# Patient Record
Sex: Female | Born: 1945 | Race: White | Hispanic: No | Marital: Married | Smoking: Former smoker
Health system: Southern US, Community
[De-identification: ages and names within clinical notes are randomized; demographics above are authoritative.]

## PROBLEM LIST (undated history)

## (undated) DIAGNOSIS — Z8719 Personal history of other diseases of the digestive system: Secondary | ICD-10-CM

## (undated) DIAGNOSIS — K219 Gastro-esophageal reflux disease without esophagitis: Secondary | ICD-10-CM

## (undated) DIAGNOSIS — M1990 Unspecified osteoarthritis, unspecified site: Secondary | ICD-10-CM

## (undated) DIAGNOSIS — I10 Essential (primary) hypertension: Secondary | ICD-10-CM

## (undated) DIAGNOSIS — R9431 Abnormal electrocardiogram [ECG] [EKG]: Secondary | ICD-10-CM

## (undated) DIAGNOSIS — F329 Major depressive disorder, single episode, unspecified: Secondary | ICD-10-CM

## (undated) DIAGNOSIS — F32A Depression, unspecified: Secondary | ICD-10-CM

## (undated) DIAGNOSIS — R112 Nausea with vomiting, unspecified: Secondary | ICD-10-CM

## (undated) DIAGNOSIS — Z9889 Other specified postprocedural states: Secondary | ICD-10-CM

## (undated) DIAGNOSIS — F411 Generalized anxiety disorder: Secondary | ICD-10-CM

## (undated) DIAGNOSIS — F3289 Other specified depressive episodes: Secondary | ICD-10-CM

## (undated) DIAGNOSIS — E039 Hypothyroidism, unspecified: Secondary | ICD-10-CM

## (undated) DIAGNOSIS — K8020 Calculus of gallbladder without cholecystitis without obstruction: Secondary | ICD-10-CM

## (undated) DIAGNOSIS — K5790 Diverticulosis of intestine, part unspecified, without perforation or abscess without bleeding: Secondary | ICD-10-CM

## (undated) DIAGNOSIS — G4733 Obstructive sleep apnea (adult) (pediatric): Principal | ICD-10-CM

## (undated) DIAGNOSIS — K222 Esophageal obstruction: Secondary | ICD-10-CM

## (undated) DIAGNOSIS — Z87440 Personal history of urinary (tract) infections: Secondary | ICD-10-CM

## (undated) DIAGNOSIS — H8110 Benign paroxysmal vertigo, unspecified ear: Secondary | ICD-10-CM

## (undated) DIAGNOSIS — K449 Diaphragmatic hernia without obstruction or gangrene: Secondary | ICD-10-CM

## (undated) DIAGNOSIS — R7309 Other abnormal glucose: Secondary | ICD-10-CM

## (undated) DIAGNOSIS — D649 Anemia, unspecified: Secondary | ICD-10-CM

## (undated) DIAGNOSIS — T7840XA Allergy, unspecified, initial encounter: Secondary | ICD-10-CM

## (undated) DIAGNOSIS — R011 Cardiac murmur, unspecified: Secondary | ICD-10-CM

## (undated) DIAGNOSIS — E785 Hyperlipidemia, unspecified: Secondary | ICD-10-CM

## (undated) DIAGNOSIS — Z8639 Personal history of other endocrine, nutritional and metabolic disease: Secondary | ICD-10-CM

## (undated) HISTORY — DX: Depression, unspecified: F32.A

## (undated) HISTORY — DX: Cardiac murmur, unspecified: R01.1

## (undated) HISTORY — DX: Essential (primary) hypertension: I10

## (undated) HISTORY — DX: Benign paroxysmal vertigo, unspecified ear: H81.10

## (undated) HISTORY — DX: Generalized anxiety disorder: F41.1

## (undated) HISTORY — DX: Diaphragmatic hernia without obstruction or gangrene: K44.9

## (undated) HISTORY — DX: Other abnormal glucose: R73.09

## (undated) HISTORY — DX: Personal history of other endocrine, nutritional and metabolic disease: Z86.39

## (undated) HISTORY — DX: Personal history of other diseases of the digestive system: Z87.19

## (undated) HISTORY — DX: Major depressive disorder, single episode, unspecified: F32.9

## (undated) HISTORY — DX: Anemia, unspecified: D64.9

## (undated) HISTORY — DX: Other specified depressive episodes: F32.89

## (undated) HISTORY — DX: Esophageal obstruction: K22.2

## (undated) HISTORY — DX: Obstructive sleep apnea (adult) (pediatric): G47.33

## (undated) HISTORY — DX: Gastro-esophageal reflux disease without esophagitis: K21.9

## (undated) HISTORY — DX: Personal history of urinary (tract) infections: Z87.440

## (undated) HISTORY — DX: Calculus of gallbladder without cholecystitis without obstruction: K80.20

## (undated) HISTORY — DX: Hypothyroidism, unspecified: E03.9

## (undated) HISTORY — DX: Diverticulosis of intestine, part unspecified, without perforation or abscess without bleeding: K57.90

## (undated) HISTORY — DX: Hyperlipidemia, unspecified: E78.5

## (undated) HISTORY — PX: DENTAL SURGERY: SHX609

## (undated) HISTORY — DX: Allergy, unspecified, initial encounter: T78.40XA

---

## 1999-11-28 ENCOUNTER — Other Ambulatory Visit: Admission: RE | Admit: 1999-11-28 | Discharge: 1999-11-28 | Payer: Self-pay | Admitting: Obstetrics & Gynecology

## 2002-03-17 ENCOUNTER — Other Ambulatory Visit: Admission: RE | Admit: 2002-03-17 | Discharge: 2002-03-17 | Payer: Self-pay | Admitting: Obstetrics & Gynecology

## 2002-05-07 HISTORY — PX: CHOLECYSTECTOMY: SHX55

## 2002-09-09 ENCOUNTER — Encounter: Payer: Self-pay | Admitting: Gastroenterology

## 2002-09-09 ENCOUNTER — Ambulatory Visit (HOSPITAL_COMMUNITY): Admission: RE | Admit: 2002-09-09 | Discharge: 2002-09-09 | Payer: Self-pay | Admitting: Gastroenterology

## 2002-09-15 ENCOUNTER — Inpatient Hospital Stay (HOSPITAL_COMMUNITY): Admission: RE | Admit: 2002-09-15 | Discharge: 2002-09-19 | Payer: Self-pay | Admitting: *Deleted

## 2002-09-15 ENCOUNTER — Encounter (INDEPENDENT_AMBULATORY_CARE_PROVIDER_SITE_OTHER): Payer: Self-pay | Admitting: *Deleted

## 2002-10-15 ENCOUNTER — Ambulatory Visit (HOSPITAL_COMMUNITY): Admission: RE | Admit: 2002-10-15 | Discharge: 2002-10-15 | Payer: Self-pay | Admitting: *Deleted

## 2003-04-19 ENCOUNTER — Other Ambulatory Visit: Admission: RE | Admit: 2003-04-19 | Discharge: 2003-04-19 | Payer: Self-pay | Admitting: Obstetrics & Gynecology

## 2003-05-08 HISTORY — PX: TOTAL ABDOMINAL HYSTERECTOMY W/ BILATERAL SALPINGOOPHORECTOMY: SHX83

## 2003-12-06 HISTORY — PX: COLONOSCOPY: SHX174

## 2003-12-06 LAB — HM COLONOSCOPY

## 2004-01-13 ENCOUNTER — Encounter (INDEPENDENT_AMBULATORY_CARE_PROVIDER_SITE_OTHER): Payer: Self-pay | Admitting: *Deleted

## 2004-01-13 ENCOUNTER — Observation Stay (HOSPITAL_COMMUNITY): Admission: RE | Admit: 2004-01-13 | Discharge: 2004-01-14 | Payer: Self-pay | Admitting: Obstetrics & Gynecology

## 2004-02-07 ENCOUNTER — Encounter: Payer: Self-pay | Admitting: Internal Medicine

## 2004-04-18 ENCOUNTER — Ambulatory Visit: Payer: Self-pay | Admitting: Gastroenterology

## 2004-10-16 ENCOUNTER — Ambulatory Visit: Payer: Self-pay | Admitting: Internal Medicine

## 2004-10-30 ENCOUNTER — Ambulatory Visit: Payer: Self-pay | Admitting: *Deleted

## 2004-11-13 ENCOUNTER — Ambulatory Visit: Payer: Self-pay | Admitting: *Deleted

## 2004-11-20 ENCOUNTER — Ambulatory Visit: Payer: Self-pay | Admitting: *Deleted

## 2004-11-28 ENCOUNTER — Ambulatory Visit: Payer: Self-pay | Admitting: *Deleted

## 2004-12-04 ENCOUNTER — Ambulatory Visit: Payer: Self-pay | Admitting: *Deleted

## 2004-12-11 ENCOUNTER — Ambulatory Visit: Payer: Self-pay | Admitting: *Deleted

## 2004-12-25 ENCOUNTER — Ambulatory Visit: Payer: Self-pay | Admitting: *Deleted

## 2005-01-01 ENCOUNTER — Ambulatory Visit: Payer: Self-pay | Admitting: *Deleted

## 2005-01-15 ENCOUNTER — Ambulatory Visit: Payer: Self-pay | Admitting: *Deleted

## 2005-01-22 ENCOUNTER — Ambulatory Visit: Payer: Self-pay | Admitting: *Deleted

## 2005-01-29 ENCOUNTER — Ambulatory Visit: Payer: Self-pay | Admitting: *Deleted

## 2005-02-05 ENCOUNTER — Ambulatory Visit: Payer: Self-pay | Admitting: *Deleted

## 2005-02-19 ENCOUNTER — Ambulatory Visit: Payer: Self-pay | Admitting: *Deleted

## 2005-02-20 ENCOUNTER — Ambulatory Visit: Payer: Self-pay | Admitting: Internal Medicine

## 2005-03-02 ENCOUNTER — Ambulatory Visit: Payer: Self-pay | Admitting: *Deleted

## 2005-03-16 ENCOUNTER — Ambulatory Visit: Payer: Self-pay | Admitting: *Deleted

## 2005-06-04 ENCOUNTER — Ambulatory Visit: Payer: Self-pay | Admitting: *Deleted

## 2005-06-25 ENCOUNTER — Ambulatory Visit: Payer: Self-pay | Admitting: *Deleted

## 2005-07-02 ENCOUNTER — Other Ambulatory Visit: Admission: RE | Admit: 2005-07-02 | Discharge: 2005-07-02 | Payer: Self-pay | Admitting: Obstetrics & Gynecology

## 2005-07-02 ENCOUNTER — Ambulatory Visit: Payer: Self-pay | Admitting: *Deleted

## 2005-07-09 ENCOUNTER — Ambulatory Visit: Payer: Self-pay | Admitting: *Deleted

## 2006-05-13 ENCOUNTER — Ambulatory Visit: Payer: Self-pay | Admitting: Internal Medicine

## 2006-05-13 LAB — CONVERTED CEMR LAB
ALT: 16 units/L (ref 0–40)
Alkaline Phosphatase: 75 units/L (ref 39–117)
BUN: 5 mg/dL — ABNORMAL LOW (ref 6–23)
Basophils Relative: 0.4 % (ref 0.0–1.0)
Calcium: 10.2 mg/dL (ref 8.4–10.5)
Chloride: 104 meq/L (ref 96–112)
Cholesterol: 217 mg/dL (ref 0–200)
Eosinophil percent: 0.9 % (ref 0.0–5.0)
Glomerular Filtration Rate, Af Am: 73 mL/min/{1.73_m2}
HCT: 37.8 % (ref 36.0–46.0)
Hemoglobin: 12.8 g/dL (ref 12.0–15.0)
LDL DIRECT: 121.5 mg/dL
Lymphocytes Relative: 21.4 % (ref 12.0–46.0)
MCV: 89.9 fL (ref 78.0–100.0)
Neutrophils Relative %: 70.4 % (ref 43.0–77.0)
Potassium: 4.7 meq/L (ref 3.5–5.1)
TSH: 0.18 microintl units/mL — ABNORMAL LOW (ref 0.35–5.50)
Total Protein: 6.9 g/dL (ref 6.0–8.3)
WBC: 6.4 10*3/uL (ref 4.5–10.5)

## 2006-06-26 ENCOUNTER — Ambulatory Visit: Payer: Self-pay | Admitting: Internal Medicine

## 2006-07-06 ENCOUNTER — Encounter: Payer: Self-pay | Admitting: Internal Medicine

## 2006-08-08 ENCOUNTER — Ambulatory Visit: Payer: Self-pay | Admitting: Internal Medicine

## 2006-08-08 LAB — CONVERTED CEMR LAB
Nitrite: NEGATIVE
Specific Gravity, Urine: 1.01 (ref 1.005–1.03)
pH: 7 (ref 5.0–8.0)

## 2006-10-08 ENCOUNTER — Encounter: Payer: Self-pay | Admitting: Family Medicine

## 2007-03-11 ENCOUNTER — Encounter (INDEPENDENT_AMBULATORY_CARE_PROVIDER_SITE_OTHER): Payer: Self-pay | Admitting: *Deleted

## 2007-04-21 ENCOUNTER — Telehealth (INDEPENDENT_AMBULATORY_CARE_PROVIDER_SITE_OTHER): Payer: Self-pay | Admitting: *Deleted

## 2007-05-30 ENCOUNTER — Telehealth (INDEPENDENT_AMBULATORY_CARE_PROVIDER_SITE_OTHER): Payer: Self-pay | Admitting: *Deleted

## 2007-06-02 ENCOUNTER — Telehealth (INDEPENDENT_AMBULATORY_CARE_PROVIDER_SITE_OTHER): Payer: Self-pay | Admitting: *Deleted

## 2007-07-22 ENCOUNTER — Encounter: Payer: Self-pay | Admitting: Internal Medicine

## 2007-07-22 DIAGNOSIS — E039 Hypothyroidism, unspecified: Secondary | ICD-10-CM

## 2007-07-22 DIAGNOSIS — Z8719 Personal history of other diseases of the digestive system: Secondary | ICD-10-CM | POA: Insufficient documentation

## 2007-07-22 DIAGNOSIS — K219 Gastro-esophageal reflux disease without esophagitis: Secondary | ICD-10-CM

## 2007-07-23 ENCOUNTER — Ambulatory Visit: Payer: Self-pay | Admitting: Internal Medicine

## 2007-07-23 DIAGNOSIS — E785 Hyperlipidemia, unspecified: Secondary | ICD-10-CM | POA: Insufficient documentation

## 2007-07-27 LAB — CONVERTED CEMR LAB
ALT: 16 units/L (ref 0–35)
AST: 22 units/L (ref 0–37)
Basophils Relative: 0.6 % (ref 0.0–1.0)
Bilirubin, Direct: 0.1 mg/dL (ref 0.0–0.3)
CO2: 27 meq/L (ref 19–32)
Calcium: 10.4 mg/dL (ref 8.4–10.5)
Chloride: 106 meq/L (ref 96–112)
Eosinophils Relative: 0.9 % (ref 0.0–5.0)
Glucose, Bld: 96 mg/dL (ref 70–99)
HDL: 81.2 mg/dL (ref >39.0)
Lymphocytes Relative: 21.8 % (ref 12.0–46.0)
Platelets: 300 10*3/uL (ref 150–400)
RBC: 4.24 M/uL (ref 3.87–5.11)
Total Bilirubin: 0.5 mg/dL (ref 0.3–1.2)
Total Protein: 6.6 g/dL (ref 6.0–8.3)
Triglycerides: 110 mg/dL (ref 0–149)
VLDL: 22 mg/dL (ref 0–40)
WBC: 6.5 10*3/uL (ref 4.5–10.5)

## 2007-07-28 ENCOUNTER — Encounter (INDEPENDENT_AMBULATORY_CARE_PROVIDER_SITE_OTHER): Payer: Self-pay | Admitting: *Deleted

## 2007-08-18 ENCOUNTER — Encounter: Payer: Self-pay | Admitting: Internal Medicine

## 2007-08-18 ENCOUNTER — Ambulatory Visit: Payer: Self-pay | Admitting: Family Medicine

## 2007-09-05 ENCOUNTER — Ambulatory Visit: Payer: Self-pay | Admitting: *Deleted

## 2007-09-09 ENCOUNTER — Encounter (INDEPENDENT_AMBULATORY_CARE_PROVIDER_SITE_OTHER): Payer: Self-pay | Admitting: *Deleted

## 2007-09-12 ENCOUNTER — Ambulatory Visit: Payer: Self-pay | Admitting: Internal Medicine

## 2007-09-18 ENCOUNTER — Encounter (INDEPENDENT_AMBULATORY_CARE_PROVIDER_SITE_OTHER): Payer: Self-pay | Admitting: *Deleted

## 2007-09-18 ENCOUNTER — Telehealth (INDEPENDENT_AMBULATORY_CARE_PROVIDER_SITE_OTHER): Payer: Self-pay | Admitting: *Deleted

## 2007-09-19 ENCOUNTER — Ambulatory Visit: Payer: Self-pay | Admitting: *Deleted

## 2007-09-24 ENCOUNTER — Ambulatory Visit: Payer: Self-pay | Admitting: Internal Medicine

## 2007-09-26 ENCOUNTER — Ambulatory Visit: Payer: Self-pay | Admitting: *Deleted

## 2007-10-03 ENCOUNTER — Ambulatory Visit: Payer: Self-pay | Admitting: *Deleted

## 2007-10-15 ENCOUNTER — Encounter: Payer: Self-pay | Admitting: Internal Medicine

## 2007-11-12 ENCOUNTER — Ambulatory Visit: Payer: Self-pay | Admitting: Cardiology

## 2007-12-02 ENCOUNTER — Telehealth (INDEPENDENT_AMBULATORY_CARE_PROVIDER_SITE_OTHER): Payer: Self-pay | Admitting: *Deleted

## 2007-12-15 ENCOUNTER — Ambulatory Visit: Payer: Self-pay | Admitting: Cardiology

## 2007-12-31 ENCOUNTER — Telehealth (INDEPENDENT_AMBULATORY_CARE_PROVIDER_SITE_OTHER): Payer: Self-pay | Admitting: *Deleted

## 2008-01-02 ENCOUNTER — Ambulatory Visit: Payer: Self-pay | Admitting: Internal Medicine

## 2008-01-02 ENCOUNTER — Telehealth (INDEPENDENT_AMBULATORY_CARE_PROVIDER_SITE_OTHER): Payer: Self-pay | Admitting: *Deleted

## 2008-01-05 ENCOUNTER — Telehealth (INDEPENDENT_AMBULATORY_CARE_PROVIDER_SITE_OTHER): Payer: Self-pay | Admitting: *Deleted

## 2008-01-06 ENCOUNTER — Encounter (INDEPENDENT_AMBULATORY_CARE_PROVIDER_SITE_OTHER): Payer: Self-pay | Admitting: *Deleted

## 2008-01-08 ENCOUNTER — Telehealth (INDEPENDENT_AMBULATORY_CARE_PROVIDER_SITE_OTHER): Payer: Self-pay | Admitting: *Deleted

## 2008-01-21 ENCOUNTER — Ambulatory Visit: Payer: Self-pay | Admitting: *Deleted

## 2008-01-30 ENCOUNTER — Ambulatory Visit: Payer: Self-pay | Admitting: *Deleted

## 2008-02-20 ENCOUNTER — Ambulatory Visit: Payer: Self-pay | Admitting: Internal Medicine

## 2008-02-20 ENCOUNTER — Ambulatory Visit: Payer: Self-pay | Admitting: *Deleted

## 2008-05-03 ENCOUNTER — Ambulatory Visit: Payer: Self-pay | Admitting: Internal Medicine

## 2008-06-02 ENCOUNTER — Ambulatory Visit: Payer: Self-pay | Admitting: Internal Medicine

## 2008-06-02 LAB — CONVERTED CEMR LAB
Bilirubin Urine: NEGATIVE
Glucose, Urine, Semiquant: NEGATIVE
Ketones, urine, test strip: NEGATIVE
Protein, U semiquant: NEGATIVE
Urobilinogen, UA: 0.2
pH: 6

## 2008-06-03 ENCOUNTER — Ambulatory Visit: Payer: Self-pay | Admitting: Internal Medicine

## 2008-06-04 ENCOUNTER — Encounter: Payer: Self-pay | Admitting: Internal Medicine

## 2008-06-04 LAB — CONVERTED CEMR LAB
Basophils Relative: 0.4 % (ref 0.0–3.0)
Eosinophils Relative: 2.1 % (ref 0.0–5.0)
Monocytes Relative: 9.3 % (ref 3.0–12.0)
Neutrophils Relative %: 65.8 % (ref 43.0–77.0)
Platelets: 322 10*3/uL (ref 150–400)
RBC: 4.3 M/uL (ref 3.87–5.11)
WBC: 7.5 10*3/uL (ref 4.5–10.5)

## 2008-06-07 ENCOUNTER — Telehealth (INDEPENDENT_AMBULATORY_CARE_PROVIDER_SITE_OTHER): Payer: Self-pay | Admitting: *Deleted

## 2008-06-07 ENCOUNTER — Encounter: Payer: Self-pay | Admitting: Internal Medicine

## 2008-08-10 ENCOUNTER — Telehealth (INDEPENDENT_AMBULATORY_CARE_PROVIDER_SITE_OTHER): Payer: Self-pay | Admitting: *Deleted

## 2008-12-02 ENCOUNTER — Encounter: Payer: Self-pay | Admitting: Internal Medicine

## 2008-12-24 ENCOUNTER — Ambulatory Visit: Payer: Self-pay | Admitting: Internal Medicine

## 2008-12-24 DIAGNOSIS — E559 Vitamin D deficiency, unspecified: Secondary | ICD-10-CM

## 2008-12-24 DIAGNOSIS — F3289 Other specified depressive episodes: Secondary | ICD-10-CM | POA: Insufficient documentation

## 2008-12-24 DIAGNOSIS — F329 Major depressive disorder, single episode, unspecified: Secondary | ICD-10-CM | POA: Insufficient documentation

## 2008-12-24 DIAGNOSIS — H8110 Benign paroxysmal vertigo, unspecified ear: Secondary | ICD-10-CM

## 2008-12-24 DIAGNOSIS — R9431 Abnormal electrocardiogram [ECG] [EKG]: Secondary | ICD-10-CM

## 2008-12-24 DIAGNOSIS — R7309 Other abnormal glucose: Secondary | ICD-10-CM

## 2008-12-24 DIAGNOSIS — F411 Generalized anxiety disorder: Secondary | ICD-10-CM | POA: Insufficient documentation

## 2008-12-27 ENCOUNTER — Ambulatory Visit: Payer: Self-pay | Admitting: Internal Medicine

## 2008-12-31 LAB — CONVERTED CEMR LAB
AST: 25 units/L (ref 0–37)
Alkaline Phosphatase: 70 units/L (ref 39–117)
Basophils Relative: 0.5 % (ref 0.0–3.0)
Bilirubin, Direct: 0 mg/dL (ref 0.0–0.3)
Calcium: 10.2 mg/dL (ref 8.4–10.5)
Cholesterol: 232 mg/dL — ABNORMAL HIGH (ref 0–200)
Creatinine, Ser: 0.7 mg/dL (ref 0.4–1.2)
Direct LDL: 142.7 mg/dL
Eosinophils Absolute: 0.1 10*3/uL (ref 0.0–0.7)
Eosinophils Relative: 1.1 % (ref 0.0–5.0)
GFR calc non Af Amer: 90.15 mL/min (ref >60)
HDL: 74.8 mg/dL (ref >39.00)
Hemoglobin: 13.1 g/dL (ref 12.0–15.0)
Lymphocytes Relative: 22.9 % (ref 12.0–46.0)
MCHC: 35.2 g/dL (ref 30.0–36.0)
Monocytes Relative: 7.2 % (ref 3.0–12.0)
Neutrophils Relative %: 68.3 % (ref 43.0–77.0)
RBC: 4.05 M/uL (ref 3.87–5.11)
Sodium: 140 meq/L (ref 135–145)
Total CHOL/HDL Ratio: 3
Total Protein: 6.9 g/dL (ref 6.0–8.3)
Triglycerides: 105 mg/dL (ref 0.0–149.0)
VLDL: 21 mg/dL (ref 0.0–40.0)
WBC: 7 10*3/uL (ref 4.5–10.5)

## 2009-01-03 ENCOUNTER — Encounter (INDEPENDENT_AMBULATORY_CARE_PROVIDER_SITE_OTHER): Payer: Self-pay | Admitting: *Deleted

## 2009-01-12 ENCOUNTER — Ambulatory Visit: Payer: Self-pay | Admitting: Cardiology

## 2009-05-16 ENCOUNTER — Ambulatory Visit: Payer: Self-pay | Admitting: Family

## 2009-06-08 ENCOUNTER — Ambulatory Visit: Payer: Self-pay | Admitting: *Deleted

## 2009-06-17 ENCOUNTER — Ambulatory Visit: Payer: Self-pay | Admitting: *Deleted

## 2009-06-29 ENCOUNTER — Ambulatory Visit: Payer: Self-pay | Admitting: *Deleted

## 2009-07-04 ENCOUNTER — Ambulatory Visit: Payer: Self-pay | Admitting: Internal Medicine

## 2009-07-04 DIAGNOSIS — R109 Unspecified abdominal pain: Secondary | ICD-10-CM | POA: Insufficient documentation

## 2009-07-04 LAB — CONVERTED CEMR LAB
Bilirubin Urine: NEGATIVE
Ketones, urine, test strip: NEGATIVE
Urobilinogen, UA: 0.2
Vit D, 25-Hydroxy: 37 ng/mL (ref 30–89)
pH: 6

## 2009-07-08 LAB — CONVERTED CEMR LAB
BUN: 5 mg/dL — ABNORMAL LOW (ref 6–23)
Basophils Relative: 5.3 % — ABNORMAL HIGH (ref 0.0–3.0)
Hemoglobin: 12.2 g/dL (ref 12.0–15.0)
Lymphocytes Relative: 47.1 % — ABNORMAL HIGH (ref 12.0–46.0)
Monocytes Relative: 28 % — ABNORMAL HIGH (ref 3.0–12.0)
Neutro Abs: 0.7 10*3/uL — ABNORMAL LOW (ref 1.4–7.7)
RBC: 3.89 M/uL (ref 3.87–5.11)

## 2009-07-13 ENCOUNTER — Ambulatory Visit: Payer: Self-pay | Admitting: *Deleted

## 2009-07-13 ENCOUNTER — Telehealth (INDEPENDENT_AMBULATORY_CARE_PROVIDER_SITE_OTHER): Payer: Self-pay | Admitting: *Deleted

## 2009-09-08 ENCOUNTER — Ambulatory Visit: Payer: Self-pay | Admitting: Internal Medicine

## 2009-09-08 DIAGNOSIS — J069 Acute upper respiratory infection, unspecified: Secondary | ICD-10-CM | POA: Insufficient documentation

## 2009-09-29 ENCOUNTER — Telehealth (INDEPENDENT_AMBULATORY_CARE_PROVIDER_SITE_OTHER): Payer: Self-pay | Admitting: *Deleted

## 2009-11-01 ENCOUNTER — Telehealth (INDEPENDENT_AMBULATORY_CARE_PROVIDER_SITE_OTHER): Payer: Self-pay | Admitting: *Deleted

## 2009-11-11 ENCOUNTER — Ambulatory Visit: Payer: Self-pay | Admitting: *Deleted

## 2009-11-18 ENCOUNTER — Ambulatory Visit: Payer: Self-pay | Admitting: *Deleted

## 2009-11-25 ENCOUNTER — Ambulatory Visit: Payer: Self-pay | Admitting: *Deleted

## 2009-12-02 ENCOUNTER — Ambulatory Visit: Payer: Self-pay | Admitting: *Deleted

## 2009-12-16 ENCOUNTER — Ambulatory Visit: Payer: Self-pay | Admitting: *Deleted

## 2009-12-28 ENCOUNTER — Ambulatory Visit: Payer: Self-pay | Admitting: *Deleted

## 2010-01-10 ENCOUNTER — Encounter: Payer: Self-pay | Admitting: Internal Medicine

## 2010-01-13 ENCOUNTER — Ambulatory Visit: Payer: Self-pay | Admitting: Cardiology

## 2010-01-23 ENCOUNTER — Ambulatory Visit: Payer: Self-pay | Admitting: Internal Medicine

## 2010-01-23 ENCOUNTER — Encounter: Payer: Self-pay | Admitting: Internal Medicine

## 2010-01-30 ENCOUNTER — Encounter: Payer: Self-pay | Admitting: Cardiology

## 2010-01-30 ENCOUNTER — Ambulatory Visit (HOSPITAL_COMMUNITY): Admission: RE | Admit: 2010-01-30 | Discharge: 2010-01-30 | Payer: Self-pay | Admitting: Cardiology

## 2010-01-30 ENCOUNTER — Ambulatory Visit: Payer: Self-pay

## 2010-01-30 ENCOUNTER — Ambulatory Visit: Payer: Self-pay | Admitting: Internal Medicine

## 2010-03-06 ENCOUNTER — Ambulatory Visit: Payer: Self-pay | Admitting: Internal Medicine

## 2010-03-13 ENCOUNTER — Ambulatory Visit: Payer: Self-pay | Admitting: Internal Medicine

## 2010-03-13 LAB — CONVERTED CEMR LAB
TSH: 0.61 microintl units/mL (ref 0.35–5.50)
Vit D, 25-Hydroxy: 50 ng/mL (ref 30–89)

## 2010-03-21 ENCOUNTER — Telehealth (INDEPENDENT_AMBULATORY_CARE_PROVIDER_SITE_OTHER): Payer: Self-pay | Admitting: *Deleted

## 2010-04-17 ENCOUNTER — Telehealth: Payer: Self-pay | Admitting: Internal Medicine

## 2010-04-19 ENCOUNTER — Ambulatory Visit: Payer: Self-pay | Admitting: Internal Medicine

## 2010-04-19 DIAGNOSIS — R439 Unspecified disturbances of smell and taste: Secondary | ICD-10-CM

## 2010-04-19 DIAGNOSIS — J309 Allergic rhinitis, unspecified: Secondary | ICD-10-CM | POA: Insufficient documentation

## 2010-06-08 NOTE — Assessment & Plan Note (Signed)
Summary: bitter taste, burning on tongue//fd   Vital Signs:  Patient profile:   65 year old female Weight:      215.6 pounds BMI:     36.01 Temp:     97.4 degrees F oral Pulse rate:   72 / minute Resp:     15 per minute BP sitting:   124 / 80  (left arm) Cuff size:   large  Vitals Entered By: Shonna Chock CMA (April 19, 2010 3:25 PM) CC: Bitter taste in mouth x 2 weeks , URI symptoms, Heartburn   Primary Care Provider:  Marga Melnick MD  CC:  Bitter taste in mouth x 2 weeks , URI symptoms, and Heartburn.  History of Present Illness:   She has had some bitter taste for several weeks w/o specific trigger such as infection,food or meds..  The patient denies nasal congestion, purulent nasal discharge, sore throat, and productive cough.    The patient reports acid reflux and epigastric & LUQ  pain last week, but denies sour taste in mouth and trouble swallowing. Her R tonsil collects debris intermittently. She denies halitosis. She has chronic PNDrainage. The patient reports  vomiting12/04/2010.  The patient denies the following alarm features: dysphagia.    Current Medications (verified): 1)  Synthroid 125 Mcg Tabs (Levothyroxine Sodium) .[name].. 1 By Mouth Once Daily Except 1/2 On Weds  Brand Name Necessary 2)  Prilosec 20 Mg  Cpdr (Omeprazole) .[name].. 1 By Mouth Once Daily 3)  Alprazolam 0.25 Mg  Tabs (Alprazolam) .... Once Daily As Needed 4)  Prozac 20 Mg  Caps (Fluoxetine Hcl) .... Take One Tablet Daily 5)  Premarin 0.45 Mg  Tabs (Estrogens Conjugated) .... Take One Tablet Daily 6)  Vitamin D (Ergocalciferol) 50000 Unit Caps (Ergocalciferol) .[name].. 1 Tab Weekly 7)  B Complex Vitamins  Caps (B Complex Vitamins) .[name].. 1 By Mouth Once Daily 8)  Vitamin D 2000 Unit Tabs (Cholecalciferol) .[name].. 1 By Mouth Once Daily 9)  Multivitamins  Tabs (Multiple Vitamin) .[name].. 1 By Mouth Once Daily 10)  Alendronate Sodium 70 Mg Tabs (Alendronate Sodium) .[name].. 1 Pill Every Week With Water 11)  Calcium 600  Mg Tabs (Calcium) .[name].. 1 By Mouth Once Daily  Allergies (verified): No Known Drug Allergies  Review of Systems ENT:  Denies decreased hearing, ear discharge, and ringing in ears; Perfumes cause nausea. Allergy:  Denies itching eyes and sneezing.  Physical Exam  General:  well-nourished; alert,appropriate and cooperative throughout examination Eyes:  No corneal or conjunctival inflammation noted. EOMI. Perrla. Ears:  External ear exam shows no significant lesions or deformities.  Otoscopic examination reveals clear canals, tympanic membranes are intact bilaterally without bulging, retraction, inflammation or discharge. Hearing is grossly normal bilaterally. Nose:  External nasal examination shows no deformity or inflammation. Nasal mucosa are pink and moist without lesions or exudates. Mouth:  Oral mucosa and oropharynx without lesions or exudates.  Upper & lower partials Cervical Nodes:  No lymphadenopathy noted Axillary Nodes:  No palpable lymphadenopathy   Impression & Recommendations:  Problem # 1:  DISTURBANCES OF SENSATION OF SMELL AND TASTE (ICD-781.1) bitter taste, ? due to #2 or #3 & possibly due to tonsilar debris  Problem # 2:  GERD (ICD-530.81)  Her updated medication list for this problem includes:    Prilosec 20 Mg Cpdr (Omeprazole) .[name]... 1 by mouth once daily  Problem # 3:  RHINITIS (ICD-477.9)  Her updated medication list for this problem includes:    Fluticasone Propionate 50 Mcg/act Susp (Fluticasone  propionate) .[name]... 1 spray two times a day as needed for rhinitis  Complete Medication List: 1)  Synthroid 125 Mcg Tabs (Levothyroxine sodium) .[name].. 1 by mouth once daily except 1/2 on weds  brand name necessary 2)  Prilosec 20 Mg Cpdr (Omeprazole) .[name].. 1 by mouth once daily 3)  Alprazolam 0.25 Mg Tabs (Alprazolam) .... Once daily as needed 4)  Prozac 20 Mg Caps (Fluoxetine hcl) .... Take one tablet daily 5)  Premarin 0.45 Mg Tabs (Estrogens conjugated) .... Take  one tablet daily 6)  Vitamin D (ergocalciferol) 50000 Unit Caps (Ergocalciferol) .[name].. 1 tab weekly 7)  B Complex Vitamins Caps (B complex vitamins) .[name].. 1 by mouth once daily 8)  Vitamin D 2000 Unit Tabs (Cholecalciferol) .[name].. 1 by mouth once daily 9)  Multivitamins Tabs (Multiple vitamin) .[name].. 1 by mouth once daily 10)  Alendronate Sodium 70 Mg Tabs (Alendronate sodium) .[name].. 1 pill every week with water 11)  Calcium 600 Mg Tabs (Calcium) .[name].. 1 by mouth once daily 12)  Fluticasone Propionate 50 Mcg/act Susp (Fluticasone propionate) .[name].. 1 spray two times a day as needed for rhinitis  Patient Instructions: 1)  Take Omeprazole two times a day pre meals as trial for 4-6 weeks. Use Water Pic  as needed for tonsillar debris. Floss  @ least once daily  2)  Avoid foods high in acid (tomatoes, citrus juices, spicy foods). Avoid eating within two hours of lying down or before exercising. Do not over eat; try smaller more frequent meals. Elevate head of bed twelve inches when sleeping. Prescriptions: FLUTICASONE PROPIONATE 50 MCG/ACT SUSP (FLUTICASONE PROPIONATE) 1 spray two times a day as needed for rhinitis  #1 x 11   Entered and Authorized by:   Marga Melnick MD   Signed by:   Marga Melnick MD on 04/19/2010   Method used:   Print then Give to Patient   RxID:   [id number]    Orders Added: 1)  Est. Patient Level III [65784]

## 2010-06-08 NOTE — Assessment & Plan Note (Signed)
Summary: FOR ALLERGIC AND OTHER THINGS--PH   Vital Signs:  Patient profile:   65 year old female Weight:      215.2 pounds Temp:     98.4 degrees F oral Pulse rate:   64 / minute Resp:     15 per minute BP sitting:   132 / 84  (left arm) Cuff size:   large  Vitals Entered By: Shonna Chock (Sep 08, 2009 11:26 AM) CC: ? Allergies, Asthma or something. Patient with cough x 5 days Comments REVIEWED MED LIST, PATIENT AGREED DOSE AND INSTRUCTION CORRECT    Primary Care Provider:  Marga Melnick MD  CC:  ? Allergies and Asthma or something. Patient with cough x 5 days.  History of Present Illness: Onset 09/03/2009 as ST & laryngitis followed by frontal headache. Chest symptoms as of 05/03. Rx: MucinexDM  Allergies (verified): No Known Drug Allergies  Review of Systems General:  Complains of malaise and sweats; denies chills and fever. ENT:  Complains of earache, nasal congestion, and sinus pressure; denies ear discharge; Facial pain w/o purulence. Resp:  Complains of cough and sputum productive; denies chest pain with inspiration, coughing up blood, and shortness of breath; Thick, opaque sputum. ? wheezing. No PMH of asthma. Allergy:  Complains of seasonal allergies and sneezing; denies itching eyes.  Physical Exam  General:  in no acute distress; alert,appropriate and cooperative throughout examination Ears:  External ear exam shows no significant lesions or deformities.  Otoscopic examination reveals clear canals, tympanic membranes are intact bilaterally without bulging, retraction, inflammation or discharge. Hearing is grossly normal bilaterally. Nose:  External nasal examination shows no deformity or inflammation. Nasal mucosa are  dry without lesions or exudates. Mouth:  Oral mucosa and oropharynx without lesions or exudates.  Teeth in good repair. Tiny uvula. Hoarse Lungs:  Normal respiratory effort, chest expands symmetrically. Lungs are clear to auscultation, no crackles or  wheezes but brassy cough Heart:  regular rhythm, no murmur, and bradycardia.   Cervical Nodes:  No lymphadenopathy noted Axillary Nodes:  No palpable lymphadenopathy   Impression & Recommendations:  Problem # 1:  BRONCHITIS-ACUTE (ICD-466.0)  Her updated medication list for this problem includes:    Azithromycin 250 Mg Tabs (Azithromycin) .[name]... As per pack    Promethazine Vc/codeine 6.25-5-10 Mg/56ml Syrp (Phenyleph-promethazine-cod) .[name]... 1 tsp every 6 hrs as needed  Problem # 2:  URI (ICD-465.9)  Her updated medication list for this problem includes:    Promethazine Vc/codeine 6.25-5-10 Mg/83ml Syrp (Phenyleph-promethazine-cod) .[name]... 1 tsp every 6 hrs as needed  Complete Medication List: 1)  Synthroid 125 Mcg Tabs (Levothyroxine sodium) .[name].. 1 by mouth once daily brand name necessary 2)  Prilosec 20 Mg Cpdr (Omeprazole) .... Take one capsule daily as needed 3)  Alprazolam 0.25 Mg Tabs (Alprazolam) .... As needed 4)  Prozac 20 Mg Caps (Fluoxetine hcl) .... Take one tablet daily 5)  Premarin 0.45 Mg Tabs (Estrogens conjugated) .... Take one tablet daily 6)  Vitamin D (ergocalciferol) 50000 Unit Caps (Ergocalciferol) .[name].. 1 tab weekly 7)  Fish Oil 1000 Mg Caps (Omega-3 fatty acids) .[name].. 1 cap once daily 8)  Vitamin B-6 Cr 200 Mg Cr-tabs (Pyridoxine hcl) .[name].. 1 tab once daily 9)  Vitamin D 400 Unit Caps (Cholecalciferol) .... 2 by mouth once daily 10)  Multivitamins Tabs (Multiple vitamin) .[name].. 1 by mouth once daily 11)  Azithromycin 250 Mg Tabs (Azithromycin) .... As per pack 12)  Promethazine Vc/codeine 6.25-5-10 Mg/84ml Syrp (Phenyleph-promethazine-cod) .[name].. 1 tsp every  6 hrs as needed  Patient Instructions: 1)  Neti pot once daily as needed for headcongestion. 2)  Drink as much fluid as you can tolerate for the next few days. Singulair 10 mg once daily (samples). Prescriptions: PROMETHAZINE VC/CODEINE 6.25-5-10 MG/5ML SYRP (PHENYLEPH-PROMETHAZINE-COD) 1 tsp every 6 hrs as needed   #120cc x 0   Entered and Authorized by:   Marga Melnick MD   Signed by:   Marga Melnick MD on 09/08/2009   Method used:   Printed then faxed to ...       [facility] [phone]* (retail)       [address]       [place], [place]  [address]       Ph: [phone]       Fax: [phone]   RxID:   [id number] AZITHROMYCIN 250 MG TABS (AZITHROMYCIN) as per pack  #1 x 0   Entered and Authorized by:   Marga Melnick MD   Signed by:   Marga Melnick MD on 09/08/2009   Method used:   Faxed to ...       [facility] [phone]* (retail)       [address]       [place], [place]  [address]       Ph: [phone]       Fax: [phone]   RxID:   [id number]

## 2010-06-08 NOTE — Assessment & Plan Note (Signed)
Summary: sinus infection/kdc   Vital Signs:  Patient profile:   65 year old female Weight:      217.8 pounds Temp:     98.1 degrees F oral BP sitting:   124 / 78  (left arm)  Vitals Entered By: Doristine Devoid (May 16, 2009 10:26 AM) CC: sinus infection and some discomfort in L ear   Primary Care Provider:  Marga Melnick MD  CC:  sinus infection and some discomfort in L ear.  History of Present Illness: Gail Fuentes is a 65 year old female who presents with c/o drainage down the back of her throat x 1 month.  Yesterday noted mild dizziness.  Today dizziness is worse.  Took dramamine with some improvement.  Allergies: No Known Drug Allergies  Review of Systems       Notes clear nasal discharge.  Denies cough, denies fever, mild sinus pressure  Physical Exam  General:  Well-developed,well-nourished,in no acute distress; alert,appropriate and cooperative throughout examination Head:  Normocephalic and atraumatic without obvious abnormalities. No apparent alopecia or balding. No sinus tenderness to palpation. Eyes:  PERRLA Ears:  External ear exam shows no significant lesions or deformities.  Otoscopic examination reveals clear canals, tympanic membranes are intact bilaterally without bulging, retraction, inflammation or discharge. Hearing is grossly normal bilaterally. Mouth:  Oral mucosa and oropharynx without lesions or exudates.  Teeth in good repair. Neck:  No deformities, masses, or tenderness noted. Lungs:  Normal respiratory effort, chest expands symmetrically. Lungs are clear to auscultation, no crackles or wheezes. Heart:  Normal rate and regular rhythm. S1 and S2 normal without gallop, murmur, click, rub or other extra sounds.   Impression & Recommendations:  Problem # 1:  SINUSITIS (ICD-473.9) Assessment New  sxs x 1 month, will treat with amoxicillin.  Also recommened use of OTC sinus washes or a Netti Pot.  Patient instructed to go to ER if she develops pain  with eye movement or if she develops visual changes.  Her updated medication list for this problem includes:    Amoxicillin 500 Mg Cap (Amoxicillin) .[name]... Take 1 capsule by mouth three times a day x 10 days  Complete Medication List: 1)  Synthroid 125 Mcg Tabs (Levothyroxine sodium) .[name].. 1 by mouth once daily brand name necessary 2)  Prilosec 20 Mg Cpdr (Omeprazole) .... Take one capsule daily as needed 3)  Alprazolam 0.25 Mg Tabs (Alprazolam) .... As needed 4)  Prozac 20 Mg Caps (Fluoxetine hcl) .... Take one tablet daily 5)  Premarin 0.45 Mg Tabs (Estrogens conjugated) .... Take one tablet daily 6)  Vitamin D (ergocalciferol) 50000 Unit Caps (Ergocalciferol) .[name].. 1 tab weekly 7)  Fish Oil 1000 Mg Caps (Omega-3 fatty acids) .[name].. 1 cap once daily 8)  Vitamin B-6 Cr 200 Mg Cr-tabs (Pyridoxine hcl) .[name].. 1 tab once daily 9)  Vitamin E 400 Unit Caps (Vitamin e) .[name].. 1 cap once daily 10)  Amoxicillin 500 Mg Cap (Amoxicillin) .... Take 1 capsule by mouth three times a day x 10 days  Patient Instructions: 1)  Please start your amoxicillin today, 2)  Call if if you develop fever over 101, symptoms worsen or do not improve. Go to ER if you develop pain with eye movement or visual changes. Prescriptions: AMOXICILLIN 500 MG CAP (AMOXICILLIN) Take 1 capsule by mouth three times a day X 10 days  #30 x 0   Entered and Authorized by:   Lemont Fillers FNP   Signed by:   Lemont Fillers FNP  on 05/16/2009   Method used:   Electronically to        [facility] [phone]* (retail)       [address]       [place], [place]  [address]       Ph: [phone]       Fax: [phone]   RxID:   [id number]

## 2010-06-08 NOTE — Miscellaneous (Signed)
Summary: BONE DENSITY  Clinical Lists Changes  Orders: Added new Test order of T-Bone Densitometry (77080) - Signed Added new Test order of T-Lumbar Vertebral Assessment (77082) - Signed 

## 2010-06-08 NOTE — Progress Notes (Signed)
Summary: Controlled Med Refill Request  Phone Note Refill Request Message from:  Patient on Sep 29, 2009 4:49 PM  Refills Requested: Medication #1:  PROZAC 20 MG  CAPS Take one tablet daily  Medication #2:  ALPRAZOLAM 0.25 MG  TABS as needed patient needs refill sent to [facility]   Method Requested: Fax to Mail Away Pharmacy Initial call taken by: Okey Regal [place],  Sep 29, 2009 4:51 PM  Follow-up for Phone Call        Dr.Hopper we never filled Alprazolam for this patient, it is on her med list. Will you approve for 90 day supply of Alprazolam Follow-up by: Shonna Chock,  Sep 30, 2009 10:13 AM  Additional Follow-up for Phone Call Additional follow up Details #1::        Alprazolam is a as needed only medication ; it should not be taken on regular basis as it is habit forming Additional Follow-up by: Marga Melnick MD,  Sep 30, 2009 2:12 PM    Additional Follow-up for Phone Call Additional follow up Details #2::    I called patient and she said just forget request for Alprazolam all together./[facility]  Sep 30, 2009 3:35 PM   Prescriptions: PROZAC 20 MG  CAPS (FLUOXETINE HCL) Take one tablet daily  #90 x 0   Entered by:   Shonna Chock   Authorized by:   Marga Melnick MD   Signed by:   Shonna Chock on 09/30/2009   Method used:   Faxed to ...       [facility]* (mail-order)             ,          Ph: [phone]       Fax: [phone]   RxID:   [id number]

## 2010-06-08 NOTE — Progress Notes (Signed)
Summary: bitter taste, tongue burning  Phone Note Call from Patient Call back at Work Phone [phone]   Caller: Patient Summary of Call: Pt c/o burning on tip of tongue and constant bitter taste in mouth despite what food or beverage she consume x3weeks. Pt denies any other symptoms.. Pt has pending appt for wednesday.[name] Pls advise on any suggestions..............Marland KitchenFelecia Deloach CMA  April 17, 2010 11:44 AM   Follow-up for Phone Call        bring all meds & supplements to appt. see if symptoms are worse after spicey foods or citrus; this would be most common causes Follow-up by: Marga Melnick MD,  April 17, 2010 12:42 PM  Additional Follow-up for Phone Call Additional follow up Details #1::        Discuss with patient................Marland KitchenFelecia Deloach CMA  April 17, 2010 3:42 PM

## 2010-06-08 NOTE — Progress Notes (Signed)
Summary: needs 90 day prescriptions instead of 30 day   Phone Note Refill Request Call back at Home Phone [phone] Call back at cell = [phone] Message from:  Patient on March 21, 2010 10:47 AM  Refills Requested: Medication #1:  ALPRAZOLAM 0.25 MG  TABS once daily as needed  Medication #2:  ALENDRONATE SODIUM 70 MG TABS 1 pill every week with water. patient took prescriptions given to her and sent them to [facility] called her back and said, if Dr Alwyn Ren will rewrite both of these meds for 90 day prescriptions with refills, it will save her a lot of money---please call patient to advise when we called them in for her  at home or cell  Initial call taken by: Jerolyn Shin,  March 21, 2010 10:51 AM  Follow-up for Phone Call        Patient aware rx's sent to the pharmacy  Follow-up by: Shonna Chock CMA,  March 21, 2010 11:14 AM    Prescriptions: ALENDRONATE SODIUM 70 MG TABS (ALENDRONATE SODIUM) 1 pill every week with water  #12 x 3   Entered by:   Shonna Chock CMA   Authorized by:   Marga Melnick MD   Signed by:   Shonna Chock CMA on 03/21/2010   Method used:   Printed then faxed to ...       [facility] (mail-order)             , [place]         Ph: [phone]       Fax: [phone]   RxID:   [id number] ALPRAZOLAM 0.25 MG  TABS (ALPRAZOLAM) once daily as needed  #90 x 0   Entered by:   Shonna Chock CMA   Authorized by:   Marga Melnick MD   Signed by:   Shonna Chock CMA on 03/21/2010   Method used:   Printed then faxed to ...       [facility] (mail-order)             , [place]         Ph: [phone]       Fax: [phone]   RxID:   [id number]

## 2010-06-08 NOTE — Consult Note (Signed)
Summary: [facility]  [facility]   Imported By: Lanelle Bal 01/19/2010 08:17:26  _____________________________________________________________________  External Attachment:    Type:   Image     Comment:   External Document

## 2010-06-08 NOTE — Assessment & Plan Note (Signed)
Summary: TO DISCUSS LABS AND BONE DEST/PH   Vital Signs:  Patient profile:   65 year old female Weight:      219.4 pounds BMI:     36.64 Pulse rate:   60 / minute Resp:     15 per minute BP sitting:   128 / 80  (left arm) Cuff size:   large  Vitals Entered By: Shonna Chock CMA (March 13, 2010 10:14 AM) CC: Follow-up visit: discuss labs and bone density   Primary Care Provider:  Marga Melnick MD  CC:  Follow-up visit: discuss labs and bone density.  History of Present Illness: T score - 2.0 @spine  & 1.9 @ L fem neck; BMD reviewed. No PMH of fractures. Her mother, bro Biomedical scientist)  & sister had Osteoporosis. She is not on calcium. Vitamin D level is 50 on irregular vit D supplementation. Walking @ work. No PMH of bone building. Pathophysiology of post menopausal bone remodeling discussed.  Current Medications (verified): 1)  Synthroid 125 Mcg Tabs (Levothyroxine Sodium) .[name].. 1 By Mouth Once Daily Brand Name Necessary 2)  Prilosec 20 Mg  Cpdr (Omeprazole) .[name].. 1 By Mouth Once Daily 3)  Alprazolam 0.25 Mg  Tabs (Alprazolam) .... As Needed 4)  Prozac 20 Mg  Caps (Fluoxetine Hcl) .... Take One Tablet Daily 5)  Premarin 0.45 Mg  Tabs (Estrogens Conjugated) .... Take One Tablet Daily 6)  Vitamin D (Ergocalciferol) 50000 Unit Caps (Ergocalciferol) .[name].. 1 Tab Weekly 7)  B Complex Vitamins  Caps (B Complex Vitamins) .[name].. 1 By Mouth Once Daily 8)  Vitamin D 1000 Unit Tabs (Cholecalciferol) .[name].. 1 By Mouth Once Daily 9)  Multivitamins  Tabs (Multiple Vitamin) .[name].. 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  Minimal crepitus of knees Psych:  Intelligent  & focused ; questions & concerns  appropriate   Impression & Recommendations:  Problem # 1:  OSTEOPENIA (ICD-733.90)  Her updated medication list for this problem includes:    Alendronate Sodium 70 Mg Tabs (Alendronate sodium) .[name]... 1 pill every week with water  Complete Medication List: 1)  Synthroid 125 Mcg Tabs (Levothyroxine sodium) .[name].. 1 by mouth once daily except 1/2 on weds  brand name necessary 2)  Prilosec 20 Mg Cpdr (Omeprazole) .[name].. 1 by mouth once daily 3)  Alprazolam 0.25 Mg Tabs (Alprazolam) .... As needed 4)  Prozac 20 Mg Caps (Fluoxetine hcl) .... Take one tablet daily 5)  Premarin 0.45 Mg Tabs (Estrogens conjugated) .... Take one tablet daily 6)  Vitamin D (ergocalciferol) 50000 Unit Caps (Ergocalciferol) .[name].. 1 tab weekly 7)  B Complex Vitamins Caps (B complex vitamins) .[name].. 1 by mouth once daily 8)  Vitamin D 1000 Unit Tabs (Cholecalciferol) .[name].. 1 by mouth once daily 9)  Multivitamins Tabs (Multiple vitamin) .[name].. 1 by mouth once daily 10)  Alendronate  Sodium 70 Mg Tabs (Alendronate sodium) .[name].. 1 pill every week with water  Patient Instructions: 1)  Vitamin D3 2000 International Units once daily ; calcium 600 mg two times a day ; walking 3-4 X/week with 30-45 min. Consider Alendronate as discussed. BMD every 25 months.Discuss recommendations with Dr Jennette Kettle & Dr Lestine Box.  2)  Avoid foods high in acid (tomatoes, citrus juices, spicy foods). Avoid eating within two hours of lying down or before exercising. Do not  over eat; try smaller more frequent meals. Elevate head of bed twelve inches when sleeping. Prescriptions: ALENDRONATE SODIUM 70 MG TABS (ALENDRONATE SODIUM) 1 pill every week with water  #4 x 11   Entered and Authorized by:   Marga Melnick MD   Signed by:   Marga Melnick MD on 03/13/2010   Method used:   Print then Give to Patient   RxID:   [id number]    Orders Added: 1)  Est. Patient Level III [08657]

## 2010-06-08 NOTE — Progress Notes (Signed)
Summary: ABOUT LAB RESULTS  Phone Note Call from Patient   Caller: Patient Reason for Call: Talk to Doctor Summary of Call: PATIENT AND LEFT A NOTE:  HOPP, WOULD YOU PLEASE GIVE ME A CALL REGARDING LAB RESULTS REPORT DATED 07/11/09 (TEST OF 07/04/09).  I AM CONCERNED ABOUT SO MANY HIGH'S AND LOWS. SHOULD I BE RECHECKED? SHOULD I BE CONCERNED ABOUT HIGH POTASSIUM OR CANCER INDICATORS? I AM TRYING TO BECOME MORE PROACTIVE ABOUT MY GOOD HEALTH AND YOUR CALL WILL HELP. THANK YOU  [name] PLEASE TRY THE CELL FIRST--[phone] OR [phone] Initial call taken by: Freddy Jaksch,  July 13, 2009 2:36 PM  Follow-up for Phone Call        Dr.Hopper spoke with patient and discussed abnormal findings and that they indicate a viral infection and ABX's dont help with that it is important that she stay hydrated as indicated on labs. No futher follow-up necessary. Patient ok'd  Follow-up by: Shonna Chock,  July 13, 2009 3:29 PM

## 2010-06-08 NOTE — Assessment & Plan Note (Signed)
Summary: adbminal pain/nausea/VIT D 268.9/kdc   Vital Signs:  Patient profile:   65 year old female Weight:      212.8 pounds Temp:     98.3 degrees F oral Resp:     16 per minute BP sitting:   124 / 76  (left arm)  Vitals Entered By: Doristine Devoid (July 04, 2009 2:46 PM) CC: abdominal pain xthrus. some nausea, diarrhea and back pain , Abdominal pain   Primary Care Provider:  Marga Melnick MD  CC:  abdominal pain xthrus. some nausea, diarrhea and back pain , and Abdominal pain.  History of Present Illness: Onset as nausea , weakness & loose stool 06/30/2009. Pains  as of 02/25 evening. Now #1 symptom is abdominal pressure & intermittent LLQ pain.The patient reports anorexia, but denies vomiting, diarrhea, constipation, melena, hematochezia, and hematemesis.  The location of the pressure  pain is epigastric and cramping  in the  left lower  quadrant.  The pains are  described as intermittent .  Associated symptoms include weight loss of 5#.  The patient denies the following symptoms: fever, dysuria, chest pain, jaundice, dark urine, and vaginal bleeding.  The epigastric  pain is worse with food. Recent increased intake of walnuts. Rx: Tylenol helped pain. PMH of diverticulosis, open cholecystectomy , TAH & BSO. 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 CV:  Denies lightheadness, near fainting, and palpitations; No exertional chest pain. Resp:  Denies shortness of breath. GU:  Denies discharge and hematuria. Derm:  Denies lesion(s) and rash.  Physical Exam  General:  Appears tired but in no acute distress; alert,appropriate and cooperative throughout examination Eyes:  No corneal or conjunctival inflammation noted. Perrla.No icterus Mouth:  Oral mucosa and oropharynx without lesions or exudates.  Teeth in good repair; upper & lower partials.No pharyngeal erythema.   Lungs:  Normal respiratory effort, chest expands symmetrically. Lungs are clear to auscultation, no crackles or wheezes. Heart:  regular rhythm and bradycardia. S4  Abdomen:  Bowel sounds positive,abdomen soft and  but tender  epigastric > LLQ without masses, organomegaly or hernias noted. Skin:  Intact without suspicious lesions or rashes. No tenting or jaundice Cervical Nodes:  No lymphadenopathy noted Axillary Nodes:  No palpable lymphadenopathy Psych:  memory intact for recent and remote, normally interactive, and good eye contact.     Impression & Recommendations:  Problem # 1:  ABDOMINAL PAIN, ACUTE (ICD-789.00) Epigastric > LLQ Orders: UA Dipstick w/o Micro (manual) (04540) Specimen Handling (99000) Culture,   Problem # 2:  VITAMIN D DEFICIENCY (ICD-268.9)  Orders: Venipuncture (98119) T-Vitamin D (25-Hydroxy) (14782-95621)  Complete Medication List: 1)  Synthroid 125 Mcg Tabs (Levothyroxine sodium) .[name].. 1 by mouth once daily brand name necessary 2)  Prilosec 20 Mg Cpdr (Omeprazole) .... Take one capsule daily as needed 3)  Alprazolam 0.25 Mg Tabs (Alprazolam) .... As needed 4)  Prozac 20 Mg Caps (  Fluoxetine hcl) .... Take one tablet daily 5)  Premarin 0.45 Mg Tabs (Estrogens conjugated) .... Take one tablet daily 6)   Vitamin D (ergocalciferol) 50000 Unit Caps (Ergocalciferol) .[name].. 1 tab weekly 7)  Fish Oil 1000 Mg Caps (Omega-3 fatty acids) .[name].. 1 cap once daily 8)  Vitamin B-6 Cr 200 Mg Cr-tabs (Pyridoxine hcl) .[name].. 1 tab once daily 9)  Vitamin E 400 Unit Caps (Vitamin e) .[name].. 1 cap once daily  Other Orders: T-Culture, Urine (16109-60454) TLB-CBC Platelet - w/Differential (85025-CBCD) TLB-Creatinine, Blood (82565-CREA) TLB-Potassium (K+) (84132-K) TLB-BUN (Urea Nitrogen) (84520-BUN)  Patient Instructions: 1)  Omeprazole 20 mg two times a day pre meals until pain gone. 2)  Avoid foods high in acid (tomatoes, citrus juices, spicy foods). Avoid eating within two hours of lying down or before exercising. Do not over eat; try smaller more frequent meals. Elevate head of bed twelve inches when sleeping. Align once daily until bowels are normal. 3)  Stick with a low residue diet and avoid foods that can irritate your bowels.  Laboratory Results   Urine Tests    Routine Urinalysis   Glucose: negative   (Normal Range: Negative) Bilirubin: negative   (Normal Range: Negative) Ketone: negative   (Normal Range: Negative) Spec. Gravity: 1.015   (Normal Range: 1.003-1.035) Blood: small   (Normal Range: Negative) pH: 6.0   (Normal Range: 5.0-8.0) Protein: negative   (Normal Range: Negative) Urobilinogen: 0.2   (Normal Range: 0-1) Nitrite: negative   (Normal Range: Negative) Leukocyte Esterace: trace   (Normal Range: Negative)

## 2010-06-08 NOTE — Progress Notes (Signed)
Summary: Refill Request  Phone Note Refill Request Message from:  Pharmacy  Refills Requested: Medication #1:  VITAMIN D (ERGOCALCIFEROL) 50000 UNIT CAPS 1 tab weekly [facility]   Method Requested: Fax to Mail Away Pharmacy Initial call taken by: Shonna Chock,  November 01, 2009 11:16 AM    Prescriptions: VITAMIN D (ERGOCALCIFEROL) 50000 UNIT CAPS (ERGOCALCIFEROL) 1 tab weekly  #12 x 3   Entered by:   Shonna Chock   Authorized by:   Marga Melnick MD   Signed by:   Shonna Chock on 11/01/2009   Method used:   Faxed to ...       [facility] (mail-order)             , [place]         Ph: [phone]       Fax: [phone]   RxID:   [id number]

## 2010-06-08 NOTE — Assessment & Plan Note (Signed)
Summary: f1y/jss  Medications Added SYNTHROID 125 MCG TABS (LEVOTHYROXINE SODIUM) 1 by mouth once daily BRAND NAME NECESSARY      Allergies Added: NKDA  Visit Type:  1 yr f/u Primary Provider:  Marga Melnick MD  CC:  pt states she thinks her diverticulitis is acting up....no other complaints today.  History of Present Illness: Gail Fuentes comes in today for followup of an abnormal EKG. Computer has read it as her wall infarct age undetermined. She has low voltage. Her EKG has not changed since 2009. Her she has no symptoms of angina or ischemic heart disease.  Her risk factors are obesity, sedentary lifestyle, and glucose intolerance. She is blessed with a good HDL level. She does not smoke and does not have hypertension. She denies any palpitations, presyncope or syncope, orthopnea or PND. No history of edema.  Current Medications (verified): 1)  Synthroid 125 Mcg Tabs (Levothyroxine Sodium) .[name].. 1 By Mouth Once Daily Brand Name Necessary 2)  Prilosec 20 Mg  Cpdr (Omeprazole) .... Take One Capsule Daily As Needed 3)  Alprazolam 0.25 Mg  Tabs (Alprazolam) .... As Needed 4)  Prozac 20 Mg  Caps (Fluoxetine Hcl) .... Take One Tablet Daily 5)  Premarin 0.45 Mg  Tabs (Estrogens Conjugated) .... Take One Tablet Daily 6)  Vitamin D (Ergocalciferol) 50000 Unit Caps (Ergocalciferol) .[name].. 1 Tab Weekly 7)  Fish Oil 1000 Mg Caps (Omega-3 Fatty Acids) .[name].. 1 Cap Once Daily 8)  Vitamin B-6 Cr 200 Mg Cr-Tabs (Pyridoxine Hcl) .[name].. 1 Tab Once Daily 9)  Vitamin D 400 Unit Caps (Cholecalciferol) .... 2 By Mouth Once Daily 10)  Multivitamins  Tabs (Multiple Vitamin) .[name].. 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) UNSPECIFIED VITAMIN  D DEFICIENCY (ICD-268.9) HYPERGLYCEMIA (ICD-790.29) DEPRESSION (ICD-311) ANXIETY (ICD-300.00) HYPOTHYROIDISM (ICD-244.9) GERD (ICD-530.81) DIVERTICULITIS, HX OF (ICD-V12.79)X 12-15 since 1990, no recurrence 2006 FALSE POSITIVE EKG, negative  STRESS CANDIOLITE X2 (2005 & 2009)   Past Surgical History: Last updated: 07/04/2009 AGE 59-ABSCESSED TEETH EXTRACTED C-SECTION 09/2002-GANGRENOUS GALLBLADDER TWO PREGNANCIES-TWO CHILDREN 12/2003-COLONOSCOPY-TICS 01/2004 TAH & BSO, OVARIAN CYST  Family History: Last updated: 01/08/2009 Father: COAD, MI @ 35 Mother: DM,dysrrhythmia,depression Siblings: bro  Wegener's  Granulomatosis,prostate CA, bro HTN & ? 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   Head:  normocephalic and atraumatic Eyes:  PERRLA/EOM intact; conjunctiva and lids normal. Neck:  Neck supple, no JVD. No masses, thyromegaly or abnormal cervical nodes. Chest Wall:  no deformities or breast masses noted Lungs:  Clear bilaterally to  auscultation and percussion. Heart:  PMI difficult to palpate, normal S1-S2, regular rate and rhythm, carotids equal bilaterally without bruits Msk:  Back normal, normal gait. Muscle strength and tone normal. Pulses:  pulses normal in all 4 extremities Extremities:  No clubbing or cyanosis. Neurologic:  Alert and  oriented x 3. Skin:  Intact without lesions or rashes. Psych:  Normal affect.   Problems:  Medical Problems Added: 1)  Dx of Abnormal Ekg  (ICD-794.31)  EKG  Procedure date:  01/13/2010  Findings:      sinus bradycardia, insignificant cues inferiorly, low voltage, no change.  Impression & Recommendations:  Problem # 1:  ABNORMAL EKG (ICD-794.31) She is still concerned that she's had a silent heart attack in the past. I have spent quite a bit today educating her about this over read and also risk factors for coronary disease and signs and symptoms of angina. We will obtain a 2-D echocardiogram to assure her that her left ventricular function is normal. If it is, also her back p.r.n. Orders: EKG w/ Interpretation (93000) Echocardiogram (Echo)  Patient Instructions: 1)  Your physician recommends that you schedule a follow-up appointment in: as needed with Dr. Daleen Squibb 2)  Your physician recommends that you continue on your current medications as directed. Please refer to the Current Medication list given to you today. 3)  Your physician has requested that you have an echocardiogram.  Echocardiography is a painless test that uses sound waves to create images of your heart. It provides your doctor with information about the size and shape of your heart and how well your heart's chambers and valves are working.  This procedure takes approximately one hour. There are no restrictions for this procedure.

## 2010-09-19 NOTE — Assessment & Plan Note (Signed)
[facility]                            CARDIOLOGY OFFICE NOTE   NAME:[name], Gail Fuentes                   MRN:          [id number]  DATE:12/15/2007                            DOB:          08-Apr-1946    [name] comes in today for followup for her atypical chest pain and  shortness of breath.   We did a stress Myoview, which showed EF of 75% with normal perfusion.  She had normal contractility and thickening in all areas of the  myocardium.  Her exercise tolerance was reasonable at 6 minutes and MET  level of 7, and maximum heart rate of 97% predicted.   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   Her lipids were also remarkably good with a total cholesterol of 217  with an HDL of 83.9, and total cholesterol-HDL ratio of 2.6.  Her LDL  was 121.   Blood pressure was borderline on last visit.  Today, it is 130/78, pulse  60 and regular, and weight is 223.   Her exam is unchanged.   I had a long talk with [name] today.  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  In  addition, I have encouraged her to try to not gain any further weight  because the risk of type 2 diabetes.  She will keep a good watch on her  blood pressure.  At this point in time, I see no further evaluation  necessary from a cardiac standpoint.  We will see her back on a p.r.n.  basis.     Thomas C. Daleen Squibb, MD, [facility]  Electronically Signed    TCW/MedQ  DD: 12/15/2007  DT: 12/16/2007  Job #: [id number]   cc:   Freddy Finner, M.D.

## 2010-09-19 NOTE — Assessment & Plan Note (Signed)
[facility]                            CARDIOLOGY OFFICE NOTE   NAME:Daponte, MARGRET MOAT                   MRN:          [id number]  DATE:11/12/2007                            DOB:          Oct 02, 1945    I was asked by Dr. Konrad Dolores to consult on Sianna Garofano with  shortness of breath and some atypical chest pain.   Ms. Clippard is a delightful 65 year old married white female who has  been having problems with catching her breath even if she wakes up in  the morning.  She says she gets up and feel like she is holding her  breath.  She has had some atypical chest pain that does not sound like  ischemia or angina.   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  She is fairly  tearful today.   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  I disagree with this reading.  She also has  sinus bradycardia and borderline low voltage, but her EKG overall is  normal.  Repeated that today as well.   PAST MEDICAL HISTORY:  She quit smoking in 1974.  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  She drinks 2-3 alcoholic beverages a day.   SURGICAL HISTORY:  C-section in 1977, cholecystectomy in 2004,  hysterectomy in 2005.   CURRENT MEDS:  1. Vitamin D3 50.  2. Aspirin 81 mg a day.  3. Fluoxetine 20 mg a day.  4. Synthroid 125 mcg a day.  5. Premarin 0.45 mg a day.  6. Omeprazole 20 mg a day.  7. Prozac 20 mg a day.   She has no known drug allergies.  She has no dye allergies.   FAMILY HISTORY:  Negative for premature coronary  disease.   SOCIAL HISTORY:  In addition to above, she is married.  She has 1 child.   REVIEW OF SYSTEMS:  Other than HPI is remarkable for fatigue, history of  gastroesophageal reflux and hiatal hernia, thyroid disease with normal  thyroid panel on recent blood work and I have reviewed today, anxiety  and depression.  Other review of systems are negative.   PHYSICAL EXAM:  She is 5 foot 5 inches, weighs 218 pounds.  She is very  emotional.  She is alert and oriented x3.  HEENT:  Normocephalic,  atraumatic.  PERRLA.  Extraocular movement is intact.  Sclerae injected.  She looks very tired.  Facial symmetry is normal.  Dentition  satisfactory.  Neck is supple.  Carotid strokes are equal bilaterally  without bruits, no JVD.  Thyroid is not enlarged.  Trachea is  midline.  Lungs are clear.  Heart reveals poorly appreciated PMI.  Normal S1 and  S2 without gallop.  Abdominal exam is soft, good bowel sounds.  Organomegaly cannot be assessed.  There is no obvious midline bruit.  Extremities without cyanosis, clubbing, or edema.  Pulses are brisk.  Neuro exam is intact.  Musculoskeletal is intact.  Skin is unremarkable.   Of note, recent blood work showed that her total cholesterol was 217,  her HDL was 83.9, her total cholesterol to HDL ratio was 2.6, LDL was  83.9.   I had a long talk with Ms. Blanke today.  I suspect that she does not  have any obstructive coronary disease and her relative risk is probably  in the range of less than 10% of having a coronary event in the next 10  years.  However, she is quite concerned and needs reassurance and also  performs heavy household cleaning duties and we need to make sure she is  safe doing her work.  Therefore, I have ordered a stress Myoview.  Looking back in her chart, she had one in 2005, which showed EF of 68%,  no ischemia.  Hope there has been no change and we can simply reassure  her.   I rechecked her blood pressure today with a large  cuff.  It was 140/82.  Initially it was 156/82.     Thomas C. Daleen Squibb, MD, [facility]  Electronically Signed    TCW/MedQ  DD: 11/12/2007  DT: 11/13/2007  Job #: [id number]   cc:   Titus Dubin. Alwyn Ren, MD,FACP,FCCP  Freddy Finner, M.D.

## 2010-09-21 ENCOUNTER — Ambulatory Visit (INDEPENDENT_AMBULATORY_CARE_PROVIDER_SITE_OTHER): Payer: 59 | Admitting: Internal Medicine

## 2010-09-21 ENCOUNTER — Encounter: Payer: Self-pay | Admitting: Internal Medicine

## 2010-09-21 DIAGNOSIS — R5383 Other fatigue: Secondary | ICD-10-CM

## 2010-09-21 DIAGNOSIS — E039 Hypothyroidism, unspecified: Secondary | ICD-10-CM

## 2010-09-21 DIAGNOSIS — R51 Headache: Secondary | ICD-10-CM

## 2010-09-21 DIAGNOSIS — M79609 Pain in unspecified limb: Secondary | ICD-10-CM

## 2010-09-21 DIAGNOSIS — R5381 Other malaise: Secondary | ICD-10-CM

## 2010-09-21 DIAGNOSIS — M79675 Pain in left toe(s): Secondary | ICD-10-CM

## 2010-09-21 LAB — BASIC METABOLIC PANEL
BUN: 8 mg/dL (ref 6–23)
GFR: 77.97 mL/min (ref >60.00)
Potassium: 4.9 mEq/L (ref 3.5–5.1)

## 2010-09-21 LAB — CBC WITH DIFFERENTIAL/PLATELET
Basophils Absolute: 0 10*3/uL (ref 0.0–0.1)
Eosinophils Absolute: 0.1 10*3/uL (ref 0.0–0.7)
Hemoglobin: 12.8 g/dL (ref 12.0–15.0)
Lymphocytes Relative: 28 % (ref 12.0–46.0)
MCHC: 34.5 g/dL (ref 30.0–36.0)
Neutro Abs: 3.7 10*3/uL (ref 1.4–7.7)
Neutrophils Relative %: 61.7 % (ref 43.0–77.0)
Platelets: 276 10*3/uL (ref 150.0–400.0)
RDW: 12.8 % (ref 11.5–14.6)

## 2010-09-21 MED ORDER — FLUTICASONE PROPIONATE 50 MCG/ACT NA SUSP: 1.0000 | Freq: Every day | NASAL | Status: DC

## 2010-09-21 NOTE — Patient Instructions (Signed)
Use the Flonase one spray twice a day as needed for headache or sinus pressure.

## 2010-09-21 NOTE — Progress Notes (Signed)
  Subjective:    Patient ID: Gail Fuentes, female    DOB: 07-17-1945, 65 y.o.   MRN: [id number]  HPI HEADACHE  Onset: 05/13  Location: maxillary Quality: dull Frequency: daily but resolving Precipitating factors: picture weighing 2 # struck bridge of nose 05/11; no LOC  Prior treatment: Tylenol with relief  Associated Symptoms Nausea/vomiting: no  Photophobia/phonophobia: ? Slightly to sound  Tearing of eyes: no  Sinus pain/pressure: yes, frontally & maxillary; worse bending over  Family hx migraine: no  Red Flags Fever: no  Neck pain/stiffness: no  Vision/speech/swallow/hearing difficulty: yes, sensitivity to noise  Focal weakness/numbness: no  Altered mental status: no  New type of headache: no, intermittently seasonally  Anticoagulant use: no      Toe  pain Onset: 4 weeks ago Trigger/injury:hit against stand of ironing board Constitutional: no Fever, chills, sweats Musculoskeletal:no Muscle cramp or pain; + joint stiffness;+redness & swelling Skin:residual "bruised"  color change Neuro:no  numbness and tingling but burning when standing Treatment/response:"buddy taped to next toe"   Review of Systems  Some fatigue X 12 months     Objective:   Physical Exam General appearance is of good health and nourishment; no acute distress or increased work of breathing is present.  No  lymphadenopathy about the head, neck, or axilla noted.   Eyes: No conjunctival inflammation or lid edema is present. There is no scleral icterus.  Ears:  External ear exam shows no significant lesions or deformities.  Otoscopic examination reveals clear canals, tympanic membranes are intact bilaterally without bulging, retraction, inflammation or discharge.  Nose:  External nasal examination shows no deformity or inflammation. Nasal mucosa are pink and moist without lesions or exudates. No septal dislocation or dislocation.No obstruction to airflow.   Oral exam: Dental hygiene is good; lips and  gums are healthy appearing.There is no oropharyngeal erythema or exudate noted.  Upper & lower partials  Neck:  No deformities, thyromegaly, masses, or tenderness noted.   Supple with full range of motion without pain.   Heart:  Normal rate and regular rhythm. S1 and S2 normal without gallop, murmur, click, rub or other extra sounds.   Lungs:Chest clear to auscultation; no wheezes, rhonchi,rales ,or rubs present.No increased work of breathing.    Extremities:  No cyanosis, edema, or clubbing  noted . There is fusiform enlargement and some hyperpigmentation of the second left toe . There is no significant tenderness to palpation or flexion. There is hammering of the second right toe.   Skin: Warm & dry w/o jaundice or tenting.  Neuro: Cranial nerve exam revealed no deficits;sensation to light touch was normal ; deep tendon reflexes were normal; gait (heel/toe) normal; balance normal ; Romberg/ finger to nose  testing normal.Strength & tone normal .        Assessment & Plan:  #1 headaches, these appear to be related to sinus pressure and not related to prior trauma. Neurologic exam is unremarkable with no deficits present.  #2 toe injury; there is probably a component of post traumatic  degenerative change.   Plan: Generic Flonase as recommended twice a day as needed for sinus pressure  Referral will be made to Dr. Lestine Box to assess the left toe injury.

## 2010-09-22 NOTE — Op Note (Signed)
NAME:  Gail Fuentes, Gail Fuentes                      ACCOUNT NO.:  [id number]   MEDICAL RECORD NO.:  [id number]                   PATIENT TYPE:  INP   LOCATION:  0380                                 FACILITY:  [facility]   PHYSICIAN:  Vikki Ports, M.D.         DATE OF BIRTH:  10/09/1945   DATE OF PROCEDURE:  09/15/2002  DATE OF DISCHARGE:                                 OPERATIVE REPORT   PREOPERATIVE DIAGNOSIS:  Acute cholecystitis.   POSTOPERATIVE DIAGNOSIS:  Acute gangrenous cholecystitis with phlegmon.   PROCEDURE:  Laparoscopy and conversion to open cholecystectomy.   SURGEON:  Vikki Ports, M.D.   ASSISTANT:  Lebron Conners, M.D.   ANESTHESIA:  General.   DESCRIPTION OF PROCEDURE:  The patient was taken to the operating room,  placed in a supine position and after adequate general anesthesia was  induced using the endotracheal tube, the abdomen was prepped and draped in  the normal sterile fashion. Using a transverse infraumbilical incision, I  dissected down to the fascia. The fascia was opened vertically and  peritoneum was entered. An #0 Vicryl pursestring suture was placed around  the fascial defect. The Hasson trocar was introduced into the abdomen and  the abdomen was insufflated with carbon dioxide. Under direct visualization,  a 10 mm port was placed in the subxiphoid region and two 5 mm ports were  placed in the right abdomen. The gallbladder could not be identified and  there was tremendous inflammation with the omentum densely adhered to the  liver. I attempted both blunt and sharp dissection using scissors and blunt  probes as well as significant amounts of irrigation. The stomach also  appeared somewhat plastered to this area. Because of poor visualization, I  opted to open. A right subcostal incision was made, I dissected down to both  layers of fascia and muscle. The peritoneum was opened and the previous  irrigation was evacuated. The stomach was  identified and a plane between it  and the phlegmon was bluntly obtained and the stomach and duodenum were then  retracted medially. It was a very difficult dissection, I began in a  retrograde fashion after identification of the gallbladder to excise it off  the liver. The posterior wall was quite adherent to the liver and was  difficult to dissect off. The gallbladder was opened again in a retrograde  fashion down to the neck of the gallbladder with tremendous inflammation, I  was able to identify the cystic duct, surround it and ligate it with an #0  silk ligature. The cystic artery, however, was much more difficult to  identify secondary to inflammation. On further dissection of the neck of the  gallbladder, there was significant arterial bleeding which was controlled  with two 2-0 silk ligatures. This controlled the bleeding and the  gallbladder was removed. A Blake drain was then placed next to the cystic  duct remnant, Surgicel was placed in the gallbladder bed. Again the  abdomen  was copiously irrigated, the fascia was closed in two layers with running #1  Novofil and the skin was closed with staples. The patient tolerated the  procedure well and went to PACU in good condition.                                               Vikki Ports, M.D.    KRH/MEDQ  D:  09/16/2002  T:  09/17/2002  Job:  [id number]

## 2010-09-22 NOTE — H&P (Signed)
NAME:  Gail Fuentes, Gail Fuentes                      ACCOUNT NO.:  [id number]   MEDICAL RECORD NO.:  [id number]                   PATIENT TYPE:  OBV   LOCATION:  NA                                   FACILITY:  [facility]   PHYSICIAN:  Freddy Finner, M.D.                DATE OF BIRTH:  Feb 17, 1946   DATE OF ADMISSION:  01/13/2004  DATE OF DISCHARGE:                                HISTORY & PHYSICAL   ADMITTING DIAGNOSES:  1.  Persistent left ovarian cyst, now present for approximately 1 year.  2.  Uterine fibroid.  3.  Perimenopausal.   Request for definitive surgical intervention to removal cyst and fibroids;  therefore, admitted for laparoscopically-assisted vaginal hysterectomy and  bilateral salpingo-oophorectomy.   HISTORY OF PRESENT ILLNESS:  The patient is a 65 year old white married  female, gravida 2, para 2, who delivered her first child vaginally, and had  a cesarean delivery for a 10 pound, 4 ounces infant in 1977, her second  pregnancy.  She has been followed in my office since 2001, at which time she  presented with menopausal symptoms.  At that time, she was still having  fairly regular menstrual periods.  A predominant component of her complaints  was PMS-types of symptoms.  She has been followed for approximately 1 year  following an incidental finding of an ovarian cyst in a non-medical office,  which was confirmed ultrasound in my office.  This cyst has persisted in the  left adnexa to the present time with the most recent ultrasound in may of  this year, first noted in August of 2004.  She is also demonstrated to have  a number of fibroids, the largest measuring 3.2 x 3.1 cm.  As a way of  definitively dealing with both problems, she has requested surgery, and is  admitted now for that purpose.   CURRENT REVIEW OF SYSTEMS:  Otherwise negative.   PAST MEDICAL HISTORY:  The patient does have hypothyroidism, for which she  takes Synthroid.  She has premenstrual dysphoric  symptoms, for which she  takes Xanax episodically as needed, and takes Prozac 90 mg weekly.  She has  no other known significant medical illnesses.   MEDICATIONS:  She is not on other medications on a chronic basis, although  she has been on Prempro 0.3/1.5 since approximately May of this year with  apparently reasonable results.   She has never had a blood transfusion.   SOCIAL HISTORY:  She does not use cigarettes.  She uses alcohol only  socially.   ALLERGIES:  No known allergies to medications.   FAMILY HISTORY:  Noncontributory.   PHYSICAL EXAMINATION:  HEENT:  Grossly within normal limits.  NECK:  Thyroid gland is not palpably enlarged.  VITAL SIGNS:  Blood pressure in the office was 116/70.  CHEST:  Clear to auscultation.  HEART:  Normal sinus rhythm without murmurs, rubs, or gallops.  BREASTS:  Considered to  be normal.  There are no palpable masses, no nipple  discharge, no skin change.  ABDOMEN:  Soft and nontender without appreciable organomegaly or palpable  masses.  EXTREMITIES:  Without clubbing, cyanosis, or edema.  PELVIC:  External genitalia, vagina, and cervix were normal to inspection.  There is a first degree cystocele, first degree rectocele, asymptomatic.  Bimanual reveals the uterus to be anterior in position, approximately 8  weeks size.  The adnexal mass cannot be palpated because of the patient's  body habitus.  RECTOVAGINAL:  Confirms these findings.  Rectum is normal.   ASSESSMENT:  1.  Persistent cystic left adnexal mass.  2.  Uterine leiomyomata.   PLAN:  Laparoscopically-assisted vaginal hysterectomy and bilateral salpingo-  oophorectomy.                                               Freddy Finner, M.D.    WRN/MEDQ  D:  01/12/2004  T:  01/13/2004  Job:  [id number]

## 2010-09-22 NOTE — Discharge Summary (Signed)
Gail Fuentes, Gail Fuentes            ACCOUNT NO.:  [id number]   MEDICAL RECORD NO.:  [id number]          PATIENT TYPE:  OBV   LOCATION:  9309                          FACILITY:  [facility]   PHYSICIAN:  Freddy Finner, M.D.   DATE OF BIRTH:  19-Oct-1945   DATE OF ADMISSION:  01/13/2004  DATE OF DISCHARGE:  01/14/2004                                 DISCHARGE SUMMARY   DISCHARGE DIAGNOSES:  1.  Multiple uterine leiomyomata.  2.  Pelvic adhesions.  3.  Benign serous cyst of left ovary.  4.  Uterine weight of 216 g.   OPERATIVE PROCEDURE:  Laparoscopically assisted vaginal hysterectomy,  bilateral salpingo-oophorectomy, lysis of pelvic adhesions.   POSTOPERATIVE COMPLICATIONS:  None.   DISPOSITION:  Patient was in satisfactory improved condition at the time of  her discharge on the evening of the first postoperative day.  She remained  afebrile throughout, she was having adequate bowel and bladder function, she  was discharged home with Percocet, Premarin 0.45 and to resume  all of her  preoperative medications, she is to take a regular diet, she is to follow up  in the office 2 weeks.   PRESENT ILLNESS:  Details of present illness, past history, family history,  review of systems, and physical exam are in admission note.  Admission  findings primarily involved pelvic findings with enlargement of the uterus.   LABORATORY DATA:  Laboratory data during this admission includes hemoglobin  of 13.3 on admission, postoperative hemoglobin of 11.5, admission  prothrombin time, PTT, and urinalysis were all normal.   HOSPITAL COURSE:  Patient was admitted on the morning of surgery.  She was  treated perioperatively with IV antibiotics and PAS compression hose.  She  was taken to the operating room where the above-described surgical procedure  was accomplished.  Her postoperative course was uncomplicated.  She remained  afebrile throughout.  By the evening of the first postop day she was in good  condition, the incisions were dry and intact, she was ambulating without  difficulty, having adequate bowel and bladder function, her condition was  considered to be satisfactory for discharge.  Further disposition as noted  above.      WRN/MEDQ  D:  01/27/2004  T:  01/27/2004  Job:  [id number]

## 2010-09-22 NOTE — Discharge Summary (Signed)
   NAME:  Gail Fuentes, Gail Fuentes                      ACCOUNT NO.:  [id number]   MEDICAL RECORD NO.:  [id number]                   PATIENT TYPE:  INP   LOCATION:  0380                                 FACILITY:  [facility]   PHYSICIAN:  Vikki Ports, M.D.         DATE OF BIRTH:  03-01-1946   DATE OF ADMISSION:  09/15/2002  DATE OF DISCHARGE:  09/19/2002                                 DISCHARGE SUMMARY   ADMISSION DIAGNOSES:  Acute cholecystitis.   DISCHARGE DIAGNOSES:  Acute gangrenous cholecystitis.   CONDITION ON DISCHARGE:  Good and improved.   DISPOSITION:  Discharged to home.   DISCHARGE MEDICATIONS:  Vicodin for pain.   HISTORY OF PRESENT ILLNESS:  The patient is a 65 year old white female with  a one month history of worsening abdominal pain.  Work-up at the direction  of Malcolm T. Russella Dar, M.D. [facility] has shown probable acute cholecystitis.  The  patient now presents for laparoscopic cholecystectomy with probable  cholangiogram.  For the remainder of the H&P please see the chart.   HOSPITAL COURSE:  The patient was admitted, taken to the operating room.  Had severe gangrenous cholecystitis on laparoscopy.  Was converted to an  open cholecystectomy.  She tolerated this well.  Blake drain was placed with  minimal bilious output.  NG tube was removed on postoperative day number  one.  She was started on clear sips.  By postoperative day number two she  was tolerating toast and crackers.  By postoperative day number three she  was afebrile.  She was hungry.  Her diet was increased to a regular diet and  her JP was removed.  On postoperative day number four she was ambulating,  tolerating a regular diet, and was discharged home.                                               Vikki Ports, M.D.    KRH/MEDQ  D:  10/07/2002  T:  10/07/2002  Job:  [id number]   cc:   Venita Lick. Russella Dar, M.D. [facility]   Larina Earthly, M.D.  [address]  [place]  [place] [address]  Fax: [phone]

## 2010-09-22 NOTE — Op Note (Signed)
NAME:  Gail Fuentes, Gail Fuentes                      ACCOUNT NO.:  [id number]   MEDICAL RECORD NO.:  [id number]                   PATIENT TYPE:  OBV   LOCATION:  9399                                 FACILITY:  [facility]   PHYSICIAN:  Freddy Finner, M.D.                DATE OF BIRTH:  March 01, 1946   DATE OF PROCEDURE:  01/13/2004  DATE OF DISCHARGE:                                 OPERATIVE REPORT   PREOPERATIVE DIAGNOSES:  1.  Fibroids.  2.  Persistent left ovarian cyst.   POSTOPERATIVE DIAGNOSES:  1.  Fibroids.  2.  Persistent left ovarian cyst.  3.  Pelvic adhesions.   OPERATIVE PROCEDURE:  1.  Laparoscopically assisted vaginal hysterectomy.  2.  Bilateral salpingo-oophorectomy.  3.  Lysis of pelvic adhesions.   SURGEON:  Freddy Finner, M.D.   ASSISTANT:  Duke Salvia. Marcelle Overlie, M.D.   ESTIMATED INTRAOPERATIVE BLOOD LOSS:  200 mL.   INTRAOPERATIVE COMPLICATIONS:  None.   INDICATION:  Details of the present illness are recorded in the admission  note.  The patient was admitted on the morning of surgery. She received an  antibiotic bolus IV and was placed in PAS compression hose.   DESCRIPTION OF PROCEDURE:  She was brought to the operating room and there  placed under adequate general anesthesia, and placed in the dorsal lithotomy  position using the Allen stirrups system.  Latex-free gloves, etc, were used  for the procedure.  The abdomen, perineum and vagina were prepped in the  usual fashion.  Bladder was evacuated using a sterile catheter.  Cervix was  visualized and Hulka tenaculum placed on the cervix without difficulty.  Sterile drapes were applied.  A small intraumbilical skin incision was made  and another small incision just above the symphysis through an old C-section  in the midline.  An 11-mm non-bladed disposable trocar was introduced  through the umbilicus.  Direct inspection revealed adequate placement  without evidence of injury on entry.  Pneumoperitoneum was allowed  to  accumulate with carbon dioxide gas.  A second trocar was placed in the lower  incision; this one was a non-bladed 5-mm disposable. Through it, a blunt  probe, grasping forceps and Nezhat irrigation system were used during the  course of the procedure.  Systematic examination of the pelvic contents  revealed a cystically enlarged left ovary, an adhesion to the upper broad  ligament and uterus from the rectosigmoid colon epiploica.  The appendix was  visualized and was normal.  Omental adhesions precluded visualization of the  upper abdomen.  This was present due to previous cholecystectomy.  Peritoneal washings were obtained.  The spring-loaded grasping forceps were  used to elevate the right adnexa and with progressive bites, the  infundibulopelvic ligament, upper broad ligament and round ligament were  developed, sealed and divided using the Gyrus tripolar device.  This was  carried down to a level just above the uterine artery.  The epiploica  adhesion was lysed on the left side, as well as an adhesion from the  ascending colon at the pelvic brim to the pelvic sidewall.  This allowed  visualization of the left adnexa.  The left adnexa was elevated and again,  the infundibulopelvic and upper broad ligament, and round ligament were  progressively taken, sealed and divided using the tripolar device; this too  was taken down to a level just above the uterine artery.  Attention was then  turned vaginally.  A posterior weighted vaginal retractor was placed.  Deavers were used to retract the anterior and lateral vaginal walls.  The  Hulka tenaculum was removed.  The cervix was grasped with a Christella Hartigan  tenaculum.  Colpotomy incision was made while tenting the mucosa posterior  to the cervix.  Cervix was circumscribed with a scalpel to release the  mucosa.  Uterosacral pedicles were taken, sealed and divided with the Heaney-  style Gyrus bipolar clamp.  The bladder pillars were taken similarly,  sealed  and divided.  The bladder was carefully advanced off the cervix in the lower  segment.  The bladder was far-advanced due to previous cesarean and the  cervical length was approximately 6 cm or more.  The cardinal ligament  pedicles were taken, sealed and divided.  The bladder was further advanced.  It was carried definitely onto the lower segment.  This allowed sealing and  dividing the vessel pedicles.  The uterus was then delivered through the  introitus using thyroid tenacula.  This allowed identification of the  bladder fold; it was somewhat thickened and it was cross-looped with the  Gyrus device, sealed and divided, allowing the uterus, tubes and ovaries to  be delivered.  Indigo carmine was injected IV; no spillage was noted at any  point during the case and definite blue discoloration of the urine was  visualized.  The uterosacrals were then anchored to the vagina with mattress  sutures of 0 Monocryl.  The uterosacrals were plicated posteriorly and  posterior peritoneum closed with a single interrupted 0 Monocryl suture.  Cuff was closed vertically with figure-of-eights of 0 Monocryl.  Foley  catheter was placed.  Re-inspection was carried out laparoscopically using  the Nezhat irrigation system.  Careful examination of all pedicles and  surfaces in the pelvis was carried out, and hemostasis was complete.  The  irrigating solution was aspirated from the abdomen.  Gas was allowed to  escape from the abdomen.  The skin incisions were anesthetized with 0.5%  Marcaine with 1:200,000 epinephrine.  Each incision was closed with  interrupted subcuticular sutures of 3-0 Dexon.  Skin glue was applied to the  lower incision also.  The patient tolerated the operative procedure well.  She was awakened and brought to the recovery room in good condition.                                               Freddy Finner, M.D.   WRN/MEDQ  D:  01/13/2004  T:  01/13/2004  Job:  [id number]

## 2010-11-13 ENCOUNTER — Other Ambulatory Visit: Payer: Self-pay | Admitting: Internal Medicine

## 2010-11-14 NOTE — Telephone Encounter (Signed)
Vit D Level 268.9

## 2010-12-13 ENCOUNTER — Encounter: Payer: Self-pay | Admitting: Internal Medicine

## 2010-12-20 ENCOUNTER — Encounter: Payer: Self-pay | Admitting: Gastroenterology

## 2010-12-22 ENCOUNTER — Other Ambulatory Visit: Payer: Self-pay | Admitting: Internal Medicine

## 2011-03-30 ENCOUNTER — Encounter: Payer: Self-pay | Admitting: Internal Medicine

## 2011-03-30 ENCOUNTER — Ambulatory Visit (INDEPENDENT_AMBULATORY_CARE_PROVIDER_SITE_OTHER): Payer: 59 | Admitting: Internal Medicine

## 2011-03-30 DIAGNOSIS — J020 Streptococcal pharyngitis: Secondary | ICD-10-CM

## 2011-03-30 DIAGNOSIS — Z20828 Contact with and (suspected) exposure to other viral communicable diseases: Secondary | ICD-10-CM

## 2011-03-30 DIAGNOSIS — E039 Hypothyroidism, unspecified: Secondary | ICD-10-CM

## 2011-03-30 DIAGNOSIS — E559 Vitamin D deficiency, unspecified: Secondary | ICD-10-CM

## 2011-03-30 DIAGNOSIS — J029 Acute pharyngitis, unspecified: Secondary | ICD-10-CM

## 2011-03-30 DIAGNOSIS — R509 Fever, unspecified: Secondary | ICD-10-CM

## 2011-03-30 LAB — CBC WITH DIFFERENTIAL/PLATELET
Basophils Relative: 0.2 % (ref 0.0–3.0)
Eosinophils Absolute: 0 10*3/uL (ref 0.0–0.7)
Eosinophils Relative: 0 % (ref 0.0–5.0)
Hemoglobin: 13.4 g/dL (ref 12.0–15.0)
Lymphocytes Relative: 7.5 % — ABNORMAL LOW (ref 12.0–46.0)
Monocytes Relative: 6.1 % (ref 3.0–12.0)
Neutrophils Relative %: 86.2 % — ABNORMAL HIGH (ref 43.0–77.0)
RBC: 4.32 Mil/uL (ref 3.87–5.11)
WBC: 14 10*3/uL — ABNORMAL HIGH (ref 4.5–10.5)

## 2011-03-30 LAB — POCT RAPID STREP A (OFFICE): Rapid Strep A Screen: POSITIVE — AB

## 2011-03-30 MED ORDER — PREDNISONE 20 MG PO TABS: 20.0000 mg | ORAL_TABLET | Freq: Two times a day (BID) | ORAL | Status: AC

## 2011-03-30 MED ORDER — PENICILLIN V POTASSIUM 500 MG PO TABS: 500.0000 mg | ORAL_TABLET | Freq: Four times a day (QID) | ORAL | Status: AC

## 2011-03-30 NOTE — Progress Notes (Signed)
  Subjective:    Patient ID: Gail Fuentes, female    DOB: 1946-02-26, 65 y.o.   MRN: [id number]  HPI Respiratory tract infection Onset/symptoms:11/20 as chills Exposures (illness/environmental/extrinsic):son has Influenza A ; she has not had a flu shot this year Progression of symptoms:to enlarged tonsils, back pain in upper back & occipital headache Treatments/response:Tylenol, Flonase, saline gargles, Tamiflu (but vomited 1 hr later) Present symptoms: Fever/chills/sweats:yes, temp up to 102.3 Frontal headache:some, not "terrible" Facial pain:some Nasal purulence:no Sore throat:stable Dental pain:no Lymphadenopathy:no Wheezing/shortness of breath:no Cough/sputum/hemoptysis:no Pleuritic pain:no Associated extrinsic/allergic symptoms:itchy eyes/ sneezing:no Smoking history:quit 1975           Review of Systems  She is a long history of "tonsils". She uses gargles to try to dislodge these.   She has been informed by [facility] that she needs followup labs concerning her vitamin D deficiency and hypothyroidism prior to refills. She's been off the 50,000 international units for 2 weeks. She has continued 2000 units of vitamin D 3 daily     Objective:   Physical Exam General appearance is of surprisingly good health and nourishment; no acute distress or increased work of breathing is present.  No  lymphadenopathy about the head, neck, or axilla noted.   Eyes: No conjunctival inflammation or lid edema is present. There is no scleral icterus.  Ears:  External ear exam shows no significant lesions or deformities.  Otoscopic examination reveals clear canals, tympanic membranes are intact bilaterally without bulging, retraction, inflammation or discharge. TMs minimally dull  Nose:  External nasal examination shows no deformity or inflammation. Nasal mucosa are dry & erythematos without lesions or exudates. No septal dislocation .No obstruction to airflow.   Oral exam: Dental  hygiene is good; lips and gums are healthy appearing.There is marked oropharyngeal erythema , asymmetric tonsillar hypertrophy & exudate noted, R > L. There is no airway compromise Neck:  No deformities,  masses, or tenderness noted.   Supple with full range of motion without pain; there is no meningismus.   Heart:  Normal rate and regular rhythm. S1 and S2 normal without gallop,  click, rub or other extra sounds. Faint grade 1/2 systolic murmur left sternal border  Lungs:Chest clear to auscultation; no wheezes, rhonchi,rales ,or rubs present.No increased work of breathing.    Extremities:  No cyanosis, edema, or clubbing  noted    Skin: Warm & dry w/o jaundice or tenting. Tissue turgor is excellent          Assessment & Plan:  #1 pharyngitis with tonsillar enlargement and exudate; no evidence of parapharyngeal abscess. Beta strep is positive.  #2 influenza A exposure. She's had some occipital headache and arthralgia/myalgia symptoms.  Plan: #1 penicillin will be prescribed along with prednisone to decrease the tonsillar hypertrophy. She should try to finish the Tamiflu if at all possible because some of  her  symptoms  do not appear to be related to pharyngitis and because of the influenza A exposure

## 2011-03-30 NOTE — Patient Instructions (Signed)
Purchase a WaterPik and use this on a regular basis if you're having tonsillar stones. Plain Mucinex for thick secretions ;force NON dairy fluids for next 48 hrs. Use a Neti pot daily as needed for sinus congestion

## 2011-03-31 LAB — VITAMIN D 25 HYDROXY (VIT D DEFICIENCY, FRACTURES): Vit D, 25-Hydroxy: 48 ng/mL (ref 30–89)

## 2011-04-02 ENCOUNTER — Telehealth: Payer: Self-pay | Admitting: Internal Medicine

## 2011-04-02 NOTE — Telephone Encounter (Signed)
Spoke with patient, informed patient test was neg. Patient can get the flu vaccine when she is fully recovered and clear of symptoms for 72 hours. Patient states she is about 80% recovered and will get vaccine when she is 100 %

## 2011-04-02 NOTE — Telephone Encounter (Signed)
Patient was seen [id number] - she wants to know result of nasal swab for flu - she said she left before she was told - she also wants to know if she should get a flu shot if she is positive for the flu

## 2011-04-18 ENCOUNTER — Other Ambulatory Visit: Payer: Self-pay | Admitting: Internal Medicine

## 2011-06-27 ENCOUNTER — Ambulatory Visit (INDEPENDENT_AMBULATORY_CARE_PROVIDER_SITE_OTHER): Payer: 59 | Admitting: Internal Medicine

## 2011-06-27 ENCOUNTER — Encounter: Payer: Self-pay | Admitting: Internal Medicine

## 2011-06-27 VITALS — Temp 98.2°F | Wt 212.4 lb

## 2011-06-27 DIAGNOSIS — J069 Acute upper respiratory infection, unspecified: Secondary | ICD-10-CM

## 2011-06-27 DIAGNOSIS — J209 Acute bronchitis, unspecified: Secondary | ICD-10-CM

## 2011-06-27 MED ORDER — FLUTICASONE PROPIONATE 50 MCG/ACT NA SUSP: 1.0000 | Freq: Two times a day (BID) | NASAL | Status: DC | PRN

## 2011-06-27 NOTE — Progress Notes (Signed)
  Subjective:    Patient ID: Gail Fuentes, female    DOB: January 02, 1946, 66 y.o.   MRN: [id number]  HPI Respiratory tract infection Onset/symptoms:06/18/11 as dry burning throat Exposures (illness/environmental/extrinsic):yes; granddaughter had conjunctivitis Progression of symptoms:to sneezing, head congestion, dry cough Treatments/response:Dimetapp with benefit Present symptoms: Fever/chills/sweats:no Frontal headache:no Facial pain:no Nasal purulence:no; it's clear Sore throat:no Dental pain:no Lymphadenopathy:no Wheezing/shortness of breath:? wheezing Cough/sputum/hemoptysis:scant white sputum Associated extrinsic/allergic symptoms:itchy eyes/ sneezing:some of both Past medical history: Seasonal allergies:? /asthma:no Smoking history:quit 1975           Review of Systems     Objective:   Physical Exam General appearance:good health ;well nourished; no acute distress or increased work of breathing is present.  No  lymphadenopathy about the head, neck, or axilla noted.   Eyes: No conjunctival inflammation or lid edema is present.   Ears:  External ear exam shows no significant lesions or deformities.  Otoscopic examination reveals clear canals, tympanic membranes are intact bilaterally without bulging, retraction, inflammation or discharge.  Nose:  External nasal examination shows no deformity or inflammation. Nasal mucosa are pink and moist without lesions or exudates. No septal dislocation or deviation.No obstruction to airflow.   Oral exam: Dental hygiene is good; lips and gums are healthy appearing.There is no oropharyngeal erythema or exudate noted. Hoarse    Heart:  Normal rate and regular rhythm. S1 and S2 normal without gallop, murmur, click, rub or other extra sounds.   Lungs:Chest clear to auscultation; no wheezes, rhonchi,rales ,or rubs present.No increased work of breathing.  Dry cough  Extremities:  No cyanosis, edema, or clubbing  noted    Skin:  Warm & dry           Assessment & Plan:  #1 upper respiratory infection without sinusitis # bronchitis , acute w/o bronchospasm  The most likely etiology of the symptoms since exposure to grandchildren her probably has a viruses and bacteria through nasal colonization Plan: See orders and recommendations

## 2011-06-27 NOTE — Patient Instructions (Signed)
Take Allegra 160 mg a day; this is a nonsedating antihistamine or take Zyrtec at bedtime,this tends to be sedating. Plain Mucinex for thick secretions ;force NON dairy fluids . Use a Neti pot daily as needed for sinus congestion. Nasal cleansing in the shower as discussed. Make sure that all residual soap is removed to prevent irritation. Do not fill the prescription for the antibiotic unless you're having the signs of active infection: frontal headaches, facial pain, purulent nasal secretions, fever, or cough with colored sputum. Flonase 1 spray in each nostril twice a day as needed. Use the "crossover" technique as discussed

## 2011-07-23 ENCOUNTER — Other Ambulatory Visit: Payer: Self-pay | Admitting: Internal Medicine

## 2011-08-09 ENCOUNTER — Ambulatory Visit (INDEPENDENT_AMBULATORY_CARE_PROVIDER_SITE_OTHER): Payer: 59 | Admitting: Family Medicine

## 2011-08-09 ENCOUNTER — Ambulatory Visit (HOSPITAL_BASED_OUTPATIENT_CLINIC_OR_DEPARTMENT_OTHER)
Admission: RE | Admit: 2011-08-09 | Discharge: 2011-08-09 | Disposition: A | Discharge status: Home / Self Care | Payer: 59 | Source: Ambulatory Visit | Attending: Family Medicine | Admitting: Family Medicine

## 2011-08-09 ENCOUNTER — Encounter: Payer: Self-pay | Admitting: Family Medicine

## 2011-08-09 VITALS — BP 118/74 | HR 58 | Temp 97.7°F | Wt 212.4 lb

## 2011-08-09 DIAGNOSIS — R05 Cough: Secondary | ICD-10-CM

## 2011-08-09 DIAGNOSIS — R059 Cough, unspecified: Secondary | ICD-10-CM | POA: Insufficient documentation

## 2011-08-09 NOTE — Progress Notes (Signed)
  Subjective:     Gail Fuentes is a 66 y.o. female here for evaluation of a cough. Onset of symptoms was 1 months ago. Symptoms have been unchanged since that time. The cough is dry and is aggravated by infection, stress and reclining position. Associated symptoms include: shortness of breath, sputum production and wheezing. Patient does not have a history of asthma. Patient does have a history of environmental allergens. Patient has not traveled recently. Patient does have a history of smoking. Patient has not had a previous chest x-ray. Patient has not had a PPD done.  The following portions of the patient's history were reviewed and updated as appropriate: allergies, current medications, past family history, past medical history, past social history, past surgical history and problem list.  Review of Systems Pertinent items are noted in HPI.    Objective:    Oxygen saturation 98% on room air BP 118/74  Pulse 58  Temp(Src) 97.7 F (36.5 C) (Oral)  Wt 212 lb 6.4 oz (96.344 kg)  SpO2 98% General appearance: alert, cooperative, appears stated age and mild distress Ears: tmi b/l Nose: normal Throat: pnd,  errythema Neck: no adenopathy Lungs: ctab/l Heart: +s1s2 Lymph nodes: no adenopathy   Assessment:    cough----- ? gerd vs allergy   Plan:    prilosec con't flonase and allegra cxr

## 2011-08-09 NOTE — Patient Instructions (Signed)

## 2011-12-27 ENCOUNTER — Encounter: Payer: Self-pay | Admitting: Internal Medicine

## 2011-12-27 ENCOUNTER — Ambulatory Visit (INDEPENDENT_AMBULATORY_CARE_PROVIDER_SITE_OTHER): Payer: 59 | Admitting: Internal Medicine

## 2011-12-27 VITALS — BP 110/66 | HR 73 | Temp 99.0°F | Resp 14 | Wt 204.6 lb

## 2011-12-27 DIAGNOSIS — J069 Acute upper respiratory infection, unspecified: Secondary | ICD-10-CM

## 2011-12-27 DIAGNOSIS — H1030 Unspecified acute conjunctivitis, unspecified eye: Secondary | ICD-10-CM

## 2011-12-27 DIAGNOSIS — J209 Acute bronchitis, unspecified: Secondary | ICD-10-CM

## 2011-12-27 DIAGNOSIS — E559 Vitamin D deficiency, unspecified: Secondary | ICD-10-CM

## 2011-12-27 DIAGNOSIS — R5383 Other fatigue: Secondary | ICD-10-CM

## 2011-12-27 DIAGNOSIS — E039 Hypothyroidism, unspecified: Secondary | ICD-10-CM

## 2011-12-27 LAB — CBC WITH DIFFERENTIAL/PLATELET
Basophils Relative: 0.1 % (ref 0.0–3.0)
Eosinophils Absolute: 0.1 10*3/uL (ref 0.0–0.7)
HCT: 40.1 % (ref 36.0–46.0)
Hemoglobin: 13 g/dL (ref 12.0–15.0)
Lymphocytes Relative: 17.2 % (ref 12.0–46.0)
MCHC: 32.5 g/dL (ref 30.0–36.0)
Monocytes Relative: 6.4 % (ref 3.0–12.0)
Neutro Abs: 5.4 10*3/uL (ref 1.4–7.7)
RBC: 4.32 Mil/uL (ref 3.87–5.11)

## 2011-12-27 LAB — BASIC METABOLIC PANEL
CO2: 29 mEq/L (ref 19–32)
Calcium: 10.7 mg/dL — ABNORMAL HIGH (ref 8.4–10.5)
Chloride: 104 mEq/L (ref 96–112)
Potassium: 5.1 mEq/L (ref 3.5–5.1)
Sodium: 139 mEq/L (ref 135–145)

## 2011-12-27 LAB — TSH: TSH: 1.29 u[IU]/mL (ref 0.35–5.50)

## 2011-12-27 MED ORDER — ERYTHROMYCIN 5 MG/GM OP OINT: TOPICAL_OINTMENT | Freq: Four times a day (QID) | OPHTHALMIC | Status: AC

## 2011-12-27 MED ORDER — AMOXICILLIN 500 MG PO CAPS: 500.0000 mg | ORAL_CAPSULE | Freq: Three times a day (TID) | ORAL | Status: AC

## 2011-12-27 NOTE — Progress Notes (Signed)
  Subjective:    Patient ID: Gail Fuentes, female    DOB: 1945/06/28, 66 y.o.   MRN: [id number]  HPI She developed a sore throat 12/18/11 following cleaning out a dusty garage 8/12. She subsequently developed malaise and excess somnolence. She states she will sleep 10-12 hours per day. She had chills 8/17 w/o fever. On 8/19 she developed hoarseness. She's also had associated loss of appetite and loose stools w/o frank diarrhea She has had some otic pain and cough productive of  sputum w/o dyspnea. She describes discharge in the right eye for 4-5 days; she began having discharge from the left as of last night She developed dull pain in her legs with some "red spots" as of 8/20. Initially she had pain in the left shin followed by some pain in the right lateral knee area. She now has pain in both calves. There's been some erythema over the left shin and at the  right medial ankle    Review of Systems She denies significant frontal headache, facial pain, or dental pain. 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  She denies knowledge of any tick bites. Because of the stresses she has missed her thyroid medication on occasion and is altered the timing she takes     Objective:   Physical Exam General appearance:appears fatigued; no acute distress or increased work of breathing is present.  No  lymphadenopathy about the head, neck, or axilla noted.   Eyes: Conjunctivitis is present on the left. Extraocular motion is intact. Vision is normal with lenses. There is no scleral icterus.  Ears:  External ear exam shows no significant lesions or deformities.  Otoscopic examination reveals clear canals, tympanic membranes are intact bilaterally without bulging, retraction, inflammation or discharge.  Nose:  External nasal examination shows no deformity or inflammation. Nasal mucosa are dry without lesions or exudates. No  septal dislocation or deviation.No obstruction to airflow.   Oral exam: Dental hygiene is good; lips and gums are healthy appearing.There is mild erythema of the hard palate; no exudate noted. She is markedly hoarse  Neck:  No deformities, thyromegaly, masses, or tenderness noted.   Supple with full range of motion without pain.   Heart:  Normal rate and regular rhythm. S1 and S2 normal without gallop, murmur, click, rub or other extra sounds.   Lungs:Chest clear to auscultation; no wheezes, rhonchi,rales ,or rubs present.No increased work of breathing.   Bowel sounds are normal. Abdomen is soft and nontender with no organomegaly, hernias  or masses.   Extremities:  No cyanosis, edema, or clubbing noted.There is faint erythema over the left shin & R medial ankle. Homans sign is negative bilaterally   Skin: Warm & dry w/o jaundice or tenting.  Deep tendon reflexes are equal and brisk          Assessment & Plan:  #1 bronchitis, acute  #2 upper respiratory tract infection  #3 conjunctivitis, acute  #4 calf pain bilaterally  #5 rash lower extremities; no history of tick bite  #6 fatigue/malaise; probably multifactorial  Plan: See orders & recommendations

## 2011-12-27 NOTE — Patient Instructions (Addendum)
NSAIDS ( Aleve, Advil, Naproxen) or Tylenol every 4 hrs as needed for fever as discussed based on label recommendations Stay well hydrated. Drink to thirst up to 40 ounces of fluids daily.   If you activate My Chart; the results can be released to you as soon as they populate from the lab. If you choose not to use this program; the labs have to be reviewed, copied & mailed   causing a delay in getting the results to you.

## 2012-01-15 ENCOUNTER — Other Ambulatory Visit: Payer: Self-pay | Admitting: Internal Medicine

## 2012-01-21 ENCOUNTER — Other Ambulatory Visit: Payer: Self-pay | Admitting: Internal Medicine

## 2012-01-23 ENCOUNTER — Ambulatory Visit (INDEPENDENT_AMBULATORY_CARE_PROVIDER_SITE_OTHER): Payer: 59 | Admitting: Internal Medicine

## 2012-01-23 ENCOUNTER — Encounter: Payer: Self-pay | Admitting: Internal Medicine

## 2012-01-23 ENCOUNTER — Ambulatory Visit (HOSPITAL_BASED_OUTPATIENT_CLINIC_OR_DEPARTMENT_OTHER)
Admission: RE | Admit: 2012-01-23 | Discharge: 2012-01-23 | Disposition: A | Discharge status: Home / Self Care | Payer: Medicare Other | Source: Ambulatory Visit | Attending: Internal Medicine | Admitting: Internal Medicine

## 2012-01-23 VITALS — BP 126/80 | HR 60 | Temp 97.5°F | Resp 14 | Wt 208.4 lb

## 2012-01-23 DIAGNOSIS — J45909 Unspecified asthma, uncomplicated: Secondary | ICD-10-CM

## 2012-01-23 DIAGNOSIS — R05 Cough: Secondary | ICD-10-CM

## 2012-01-23 DIAGNOSIS — R059 Cough, unspecified: Secondary | ICD-10-CM | POA: Insufficient documentation

## 2012-01-23 DIAGNOSIS — D649 Anemia, unspecified: Secondary | ICD-10-CM

## 2012-01-23 DIAGNOSIS — K219 Gastro-esophageal reflux disease without esophagitis: Secondary | ICD-10-CM

## 2012-01-23 HISTORY — DX: Anemia, unspecified: D64.9

## 2012-01-23 LAB — CBC WITH DIFFERENTIAL/PLATELET
Eosinophils Relative: 2.4 % (ref 0.0–5.0)
HCT: 35 % — ABNORMAL LOW (ref 36.0–46.0)
Hemoglobin: 11.7 g/dL — ABNORMAL LOW (ref 12.0–15.0)
Lymphs Abs: 1.6 10*3/uL (ref 0.7–4.0)
MCV: 90.4 fl (ref 78.0–100.0)
Monocytes Absolute: 0.5 10*3/uL (ref 0.1–1.0)
Monocytes Relative: 8 % (ref 3.0–12.0)
Neutro Abs: 3.8 10*3/uL (ref 1.4–7.7)
Platelets: 253 10*3/uL (ref 150.0–400.0)
RDW: 13.3 % (ref 11.5–14.6)
WBC: 6 10*3/uL (ref 4.5–10.5)

## 2012-01-23 MED ORDER — BUDESONIDE-FORMOTEROL FUMARATE 160-4.5 MCG/ACT IN AERO: 2.0000 | INHALATION_SPRAY | Freq: Two times a day (BID) | RESPIRATORY_TRACT | Status: DC

## 2012-01-23 MED ORDER — OMEPRAZOLE 20 MG PO CPDR: DELAYED_RELEASE_CAPSULE | ORAL | Status: DC

## 2012-01-23 NOTE — Patient Instructions (Addendum)
Plain Mucinex for thick secretions ;force NON dairy fluids . Use a Neti pot daily as needed for sinus congestion; going from open side to congested side . Nasal cleansing in the shower as discussed. Make sure that all residual soap is removed to prevent irritation. Plain Allegra 160 daily as needed for itchy eyes & sneezing. Symbicort  One-2  inhalations every 12 hours; gargle and spit after use  Order for x-rays entered into  the computer; these will be performed at [facility]. No appointment is necessary.  The triggers for dyspepsia or "heart burn"  include stress; the "aspirin family" ; alcohol; peppermint; and caffeine (coffee, tea, cola, and chocolate). The aspirin family would include aspirin and the nonsteroidal agents such as ibuprofen &  Naproxen. Tylenol would not cause reflux. If having dyspepsia ; food & drink should be avoided for @ least 2 hours before going to bed.

## 2012-01-23 NOTE — Progress Notes (Signed)
  Subjective:    Patient ID: Gail Fuentes, female    DOB: [birth year], 66 y.o.   MRN: [id number]  HPI Since August she's had intermittent paroxysmal coughing with a sensation of incomplete breath. Rarely she'll have some white creamy sputum; there is no purulent sputum or hemoptysis.  She has no history of asthma but in late fall she was sneezing and some itchy, watery eyes. These are not significant symptoms at this time. She describes some itching in her ears.  She is intermittently exposed to mold @ her mother's house.  She ran out of her omeprazole and has had increased reflux symptoms     Review of Systems She denies associated dysphagia, unexplained weight loss, melena, rectal bleeding.  She denies significant frontal headache or facial pain. She has had some pressure in the right maxillary sinus. She has had some yellow nasal drainage      Objective:   Physical Exam General :well nourished; no acute distress or increased work of breathing is present.  No  lymphadenopathy about the head, neck, or axilla noted.   Eyes: No conjunctival inflammation or lid edema is present. There is no scleral icterus.  Ears:  External ear exam shows no significant lesions or deformities.  Otoscopic examination reveals clear canals, tympanic membranes are intact bilaterally without bulging, retraction, inflammation or discharge.  Nose:  External nasal examination shows no deformity or inflammation. Nasal mucosa are pink and moist without lesions or exudates. No septal dislocation or deviation.No obstruction to airflow.   Oral exam: Dental hygiene is good; lips and gums are healthy appearing.There is no oropharyngeal erythema or exudate noted. Partials. Hoarse slightly  Neck:  No deformities,  masses, or tenderness noted.     Heart:  Normal rate and regular rhythm. S1 and S2 normal without gallop,  click, rub or other extra sounds. Grade 1/2-1 intermittent systolic murmur at the  base  Lungs:Chest clear to auscultation; no wheezes, rhonchi,rales ,or rubs present.No increased work of breathing.  Intermittent barking cough  Extremities:  No cyanosis, edema, or clubbing  noted    Skin: Warm & dry           Assessment & Plan:  #1 cough; asthmatic bronchitis suggested. She is not having significant symptoms of active bacterial bronchitis or rhinosinusitis. She has no significant symptoms of rhinoconjunctivitis.  #2 reflux; increased symptoms of medications. The reflux is most likely etiology of her symptoms.  Plan: Bronchodilator therapy will be initiated. Omeprazole be increased to twice a day. Antireflux measurements stress

## 2012-01-24 NOTE — Progress Notes (Signed)
Communicated lab results to patient; patient acknowledged and understood

## 2012-01-28 ENCOUNTER — Other Ambulatory Visit (INDEPENDENT_AMBULATORY_CARE_PROVIDER_SITE_OTHER): Payer: 59

## 2012-01-28 DIAGNOSIS — D649 Anemia, unspecified: Secondary | ICD-10-CM

## 2012-01-28 LAB — VITAMIN B12: Vitamin B-12: 423 pg/mL (ref 211–911)

## 2012-01-28 LAB — CBC WITH DIFFERENTIAL/PLATELET
Basophils Absolute: 0 10*3/uL (ref 0.0–0.1)
Lymphocytes Relative: 26.1 % (ref 12.0–46.0)
Monocytes Relative: 6.4 % (ref 3.0–12.0)
Neutrophils Relative %: 65.2 % (ref 43.0–77.0)
Platelets: 252 10*3/uL (ref 150.0–400.0)
RDW: 13.4 % (ref 11.5–14.6)

## 2012-01-28 NOTE — Progress Notes (Signed)
Labs only

## 2012-01-31 ENCOUNTER — Telehealth: Payer: Self-pay | Admitting: Internal Medicine

## 2012-01-31 NOTE — Telephone Encounter (Signed)
Caller: Gail Fuentes/Patient, PCP Hopper.she states sick since August with off/on . Finally,   She states she was in the office on 01/23/12 for an Upper Respiratory Infection -cough and congestion. Chest Xray was normal.   She reports constant coughing , congestion and pressure under eyes/face.  Teeth are aching.  She is currently taking symbicort, Flonase, Delsym . Cough dry to mild productive- yellow in color, Afebrile, voice is hoarse, headaches , she stats she is worn down and feels "jittery" inside. Unable to sleep due to coughing. She is not eating well but she is drinking fluids.  Emergent s/sx ruled out per Cough-Adult Protocol with the exception to "Productive cough with colored sputum (other than clear or white sputum)" See Provider in 24 hours. Appointment scheduled 02/01/12 with Dr. Drue Novel at 11:15 in the office. Home care reviewed. Understanding expressed. She will call back for questions, changes or concerns.

## 2012-01-31 NOTE — Telephone Encounter (Signed)
Noted, patient with pending appointment. Per Dr.Hopper's protocol, ok to close encounter if appointment scheduled

## 2012-02-01 ENCOUNTER — Encounter: Payer: Self-pay | Admitting: Internal Medicine

## 2012-02-01 ENCOUNTER — Ambulatory Visit (INDEPENDENT_AMBULATORY_CARE_PROVIDER_SITE_OTHER): Payer: 59 | Admitting: Internal Medicine

## 2012-02-01 VITALS — BP 124/68 | HR 66 | Temp 97.5°F | Wt 204.0 lb

## 2012-02-01 DIAGNOSIS — F411 Generalized anxiety disorder: Secondary | ICD-10-CM

## 2012-02-01 DIAGNOSIS — R05 Cough: Secondary | ICD-10-CM

## 2012-02-01 MED ORDER — HYDROCODONE-HOMATROPINE 5-1.5 MG/5ML PO SYRP: 5.0000 mL | ORAL_SOLUTION | Freq: Every evening | ORAL | Status: DC | PRN

## 2012-02-01 MED ORDER — AZITHROMYCIN 250 MG PO TABS: ORAL_TABLET | ORAL | Status: DC

## 2012-02-01 MED ORDER — PREDNISONE 10 MG PO TABS: ORAL_TABLET | ORAL | Status: DC

## 2012-02-01 NOTE — Assessment & Plan Note (Addendum)
Quite anxious > depress about her mother's health, she has multiple medical problems and probably will need hospice pretty soon. I counseled her the best I could, from what I see, she is doing the best she can to provide comfort and care to her mother.  I recommend her to see Raynelle Fanning our therapist. She is taking alprazolam hardly ever,  is okay to take it twice a day as needed She is taking fluoxetine 1.5 tablets as prescribed by another practice, recommend to take 2 tablets daily. Followup with PCP in 3 weeks.

## 2012-02-01 NOTE — Patient Instructions (Addendum)
Please see Raynelle Fanning, our therapist Xanax twice a day is okay, to be taken as needed Increase fluoxetine to 2 tablets a day -------- For cough, take Mucinex DM twice a day as needed  If you have severe cough at night, take hydrocodone Take Allegra and Prilosec over-the-counter every day until you feel better Continue with your  nasal sprays Take the antibiotic as prescribed  (zpack) See Hopp in 3 weeks Call anytime if the symptoms are severe

## 2012-02-01 NOTE — Progress Notes (Signed)
  Subjective:    Patient ID: Gail Fuentes, female    DOB: Nov 26, 1945, 66 y.o.   MRN: [id number]  HPI Acute visit, has to issues: Persisting cough, was seen 12/27/2011 and prescribed amoxicillin,  was seen 01/23/2012, a chest x-ray was negative, she was prescribed Prilosec and Symbicort. She was also recommend Mucinex and Allegra, has not been taking Mucinex regularly, started Allegra 2 days ago. She continue with cough.  Also, she is under a lot of stress, mother has COPD, CHF, diabetes, dementia, she is going to have a talk with hospice about her mother soon. Takes fluoxetine; also  Xanax rarely although she took Xanax almost daily for the last 3 days.  Past Medical History  Diagnosis Date  . Hyperlipidemia   . Vitamin d deficiency   . Benign paroxysmal positional vertigo   . Other abnormal glucose   . Depressive disorder, not elsewhere classified   . Anxiety state, unspecified   . Unspecified hypothyroidism   . Esophageal reflux   . Personal history of other diseases of digestive system     12-15 since 1990, no recurrence 2006  . Allergy     sneezing  late Fall   Past Surgical History  Procedure Date  . Dental surgery     Age 72-Abscessed Teeth Extracted   . Cesarean section   . Colonoscopy 12/2003    Tics  . Total abdominal hysterectomy w/ bilateral salpingoophorectomy     Ovarian Cyst     Review of Systems She has noted some wheezing and chest congestion. Mild sinus pain, occasional nasal discharge, color is clear to yellow. Denies itchy eyes or itchy nose No fever or chills. Admits to sick contacts, her husband and grandchild have been sick with respiratory symptoms    Objective:   Physical Exam  General -- alert, well-developed, and overweight appearing.   HEENT -- TMs normal, throat w/o redness, face symmetric and slt tender to palpation R>L maxilary sinus, nose congested (pt crying) Lungs -- no increased work of breathing, fetal rhonchi throughout along  with a few wheezes. No crackles. Heart-- normal rate, regular rhythm, no murmur, and no gallop.    Extremities-- no pretibial edema bilaterally  Psych-- Cognition and judgment appear intact. Alert and cooperative with normal attention span and concentration.  Patient looks anxious and slightly depressed, tearful when we talk about her mother.     Assessment & Plan:   Persisting cough Persisting cough for the last month, she was a light smoker in her 40s, has no history of asthma but has been prescribe inhalers on and off throughout her life. Recent chest x-ray showed no acute disease. Atypical infection? Plan: Zithromax Low dose of prednisone Continue with Symbicort, Allegra, hydrocodone as needed Followup with PCP in 3  Weeks  ---- Today , I spent more than 25  min with the patient, >50% of the time counseling, see anxiety

## 2012-02-20 ENCOUNTER — Ambulatory Visit (INDEPENDENT_AMBULATORY_CARE_PROVIDER_SITE_OTHER): Payer: 59 | Admitting: Internal Medicine

## 2012-02-20 ENCOUNTER — Encounter: Payer: Self-pay | Admitting: Internal Medicine

## 2012-02-20 VITALS — BP 120/74 | HR 69 | Temp 97.7°F | Wt 206.4 lb

## 2012-02-20 DIAGNOSIS — R300 Dysuria: Secondary | ICD-10-CM

## 2012-02-20 DIAGNOSIS — R3915 Urgency of urination: Secondary | ICD-10-CM

## 2012-02-20 DIAGNOSIS — I517 Cardiomegaly: Secondary | ICD-10-CM | POA: Insufficient documentation

## 2012-02-20 DIAGNOSIS — J40 Bronchitis, not specified as acute or chronic: Secondary | ICD-10-CM

## 2012-02-20 DIAGNOSIS — R34 Anuria and oliguria: Secondary | ICD-10-CM

## 2012-02-20 LAB — POCT URINALYSIS DIPSTICK
Leukocytes, UA: NEGATIVE
Protein, UA: NEGATIVE
Spec Grav, UA: 1.015
Urobilinogen, UA: 0.2

## 2012-02-20 NOTE — Addendum Note (Signed)
Addended by: Maurice Small on: 02/20/2012 11:08 AM   Modules accepted: Orders

## 2012-02-20 NOTE — Patient Instructions (Addendum)
Blood Pressure Goal  Ideally is an AVERAGE < 135/85. This AVERAGE should be calculated from @ least 5-7 BP readings taken @ different times of day on different days of week. You should not respond to isolated BP readings , but rather the AVERAGE for that week.  Drink as much nondairy fluids as possible. Avoid spicy foods or alcohol as  these may aggravate the bladder. Do not take decongestants. Avoid narcotics if possible.   The normal goal for  Vitamin D is 40-60. Vitamin D & weight bearing exercises ( @ least 30 minutes of walking @ least 3X/ week),  is essential for bone health. Take 1000 IU vitamin D3  daily . Recheck vit D level in 4 months with calcium level (268.9)   If you activate My Chart; the results can be released to you as soon as they populate from the lab. If you choose not to use this program; the labs have to be reviewed, copied & mailed   causing a delay in getting the results to you.

## 2012-02-20 NOTE — Progress Notes (Signed)
  Subjective:    Patient ID: Gail Fuentes, female    DOB: 1946-02-28, 66 y.o.   MRN: [id number]  HPI Her cough has essentially resolved; it is at least "90%" better following a course of azithromycin,suggesting an atypical bacterial organism as etiology of the bronchitis. She denies fever, chills, sweats, or sputum production. She has no shortness of breath or wheezing. She is not using the Symbicort as it makes her jittery.   Review of Systems Chest x-ray images were reviewed. There was no active cardiopulmonary disease but mild cardiomegaly without heart failure was suggested.She denies chest pain, palpitations, paroxysmal nocturnal dyspnea, edema, or claudication.  For several weeks she describes urgency , slight dysuria, and oliguria. Azo & hydration have helped. Her vitamin D level was 59 in August. She had been on 50,000 units once a week along with 1000 international units of vitamin D 3 daily. Her calcium had been 10.7 on 12/27/11     Objective:   Physical Exam General appearance:good nourishment w/o distress.  Eyes: No conjunctival inflammation or scleral icterus is present.  Oral exam: Dental hygiene is good; lips and gums are healthy appearing.There is no oropharyngeal erythema or exudate noted.   Neck: no NVD  Heart:  Normal rate and regular rhythm. S1 and S2 normal without gallop, murmur, click, rub .S 4  Lungs:Chest clear to auscultation; no wheezes, rhonchi,rales ,or rubs present.No increased work of breathing.   Abdomen: bowel sounds normal, soft and non-tender without masses, organomegaly or hernias noted.  No guarding or rebound . No HJR  Skin:Warm & dry.  Intact without suspicious lesions or rashes   Lymphatic: No lymphadenopathy is noted about the head, neck, axilla areas.              Assessment & Plan:  #1 atypical bronchitis, resolved  #2 mild cardiomegaly with no evidence radiographically or clinically of cardiac decompensation  #3 urgency,  dysuria, oliguria.  Plan see orders & recommendations

## 2012-02-22 LAB — URINE CULTURE: Colony Count: NO GROWTH

## 2012-03-11 ENCOUNTER — Ambulatory Visit (INDEPENDENT_AMBULATORY_CARE_PROVIDER_SITE_OTHER): Payer: 59

## 2012-03-11 DIAGNOSIS — Z23 Encounter for immunization: Secondary | ICD-10-CM

## 2012-09-08 ENCOUNTER — Other Ambulatory Visit: Payer: Self-pay | Admitting: *Deleted

## 2012-09-08 MED ORDER — LEVOTHYROXINE SODIUM 125 MCG PO TABS: ORAL_TABLET | ORAL | Status: DC

## 2012-09-08 NOTE — Telephone Encounter (Signed)
Rx sent 

## 2012-10-01 ENCOUNTER — Encounter: Payer: Self-pay | Admitting: Internal Medicine

## 2012-10-01 ENCOUNTER — Ambulatory Visit (INDEPENDENT_AMBULATORY_CARE_PROVIDER_SITE_OTHER): Payer: Medicare Other | Admitting: Internal Medicine

## 2012-10-01 VITALS — BP 124/80 | HR 65 | Temp 98.1°F | Resp 12 | Ht 65.0 in | Wt 206.0 lb

## 2012-10-01 DIAGNOSIS — M899 Disorder of bone, unspecified: Secondary | ICD-10-CM

## 2012-10-01 DIAGNOSIS — R6883 Chills (without fever): Secondary | ICD-10-CM

## 2012-10-01 DIAGNOSIS — M2550 Pain in unspecified joint: Secondary | ICD-10-CM

## 2012-10-01 DIAGNOSIS — R259 Unspecified abnormal involuntary movements: Secondary | ICD-10-CM

## 2012-10-01 DIAGNOSIS — M542 Cervicalgia: Secondary | ICD-10-CM

## 2012-10-01 DIAGNOSIS — E039 Hypothyroidism, unspecified: Secondary | ICD-10-CM

## 2012-10-01 DIAGNOSIS — M949 Disorder of cartilage, unspecified: Secondary | ICD-10-CM

## 2012-10-01 LAB — CBC WITH DIFFERENTIAL/PLATELET
Basophils Absolute: 0 10*3/uL (ref 0.0–0.1)
Eosinophils Absolute: 0.2 10*3/uL (ref 0.0–0.7)
Eosinophils Relative: 1.8 % (ref 0.0–5.0)
MCV: 89.7 fl (ref 78.0–100.0)
Monocytes Absolute: 0.7 10*3/uL (ref 0.1–1.0)
Neutrophils Relative %: 80.5 % — ABNORMAL HIGH (ref 43.0–77.0)
Platelets: 411 10*3/uL — ABNORMAL HIGH (ref 150.0–400.0)
RDW: 12.8 % (ref 11.5–14.6)
WBC: 8.8 10*3/uL (ref 4.5–10.5)

## 2012-10-01 LAB — BASIC METABOLIC PANEL
BUN: 9 mg/dL (ref 6–23)
Chloride: 104 mEq/L (ref 96–112)
Creatinine, Ser: 0.8 mg/dL (ref 0.4–1.2)

## 2012-10-01 LAB — SEDIMENTATION RATE: Sed Rate: 86 mm/hr — ABNORMAL HIGH (ref 0–22)

## 2012-10-01 LAB — TSH: TSH: 1.92 u[IU]/mL (ref 0.35–5.50)

## 2012-10-01 NOTE — Patient Instructions (Addendum)
Please review the record and make any corrections; share this with all medical staff seen. If you note change or progression in symptoms please contact us through My Chart ASAP. This will allow [name] to respond as quickly as possible and schedule appropriate studies (blood test or imaging).Please keep a diary of your headaches .  To prevent involuntary movements, avoid stimulants such as decongestants, diet pills, nicotine, or caffeine (coffee, tea, cola, or chocolate) to excess.  Document  each occurrence on the calendar with notation of : #1 any prodrome ( any non headache symptom such as marked fatigue,visual changes, ,etc ) which precedes actual headache ; #2) severity on 1-10 scale; #3) any triggers ( food/ drink,enviromenntal or weather changes ,physical or emotional stress) in 8-12 hour period prior to the headache; & #4) response to any medications or other intervention. Please review "Headache" @ [facility] for additional information.    A diagnosis is best made by taking a good history. To help facilitate that process I'm asking you to take a few minutes to review the events which prompted you to schedule an appointment .Reviewing the events will help  identify pertinent issues & aid in making a correct diagnosis. When did your symptoms start ? What was (were) the first symptom (s) ? Was there any trigger, injury, or environmental factors (allergen,dust, illness in associates) causing the symptoms ?  Did symptoms remain stable; progress or improve ? What other associated SIGNIFICANT symptoms or signs have appeared ? Are  there MINOR symptoms also present ? What interventions or factors (OTC or Rx meds,change in activity,etc) appeared to improve symptoms ? Were there any factors or interventions which made your symptoms worse?

## 2012-10-01 NOTE — Progress Notes (Signed)
  Subjective:    Patient ID: Gail Fuentes, female    DOB: 06/20/1945, 67 y.o.   MRN: [id number]  HPI    Review of Systems     Objective:   Physical Exam        Assessment & Plan:  Clarification of note concerning involuntary mouth movements: The infrequent occurrence will tend to make diagnosis and treatment somewhat difficult. Referral to a movement specialist will be considered if all labs are nondiagnostic or negative.  Her head and neck/shoulder symptoms as well as diffuse arthralgias may represent a variant of polymyalgia rheumatica.

## 2012-10-01 NOTE — Progress Notes (Signed)
Subjective:    Patient ID: Gail Fuentes, female    DOB: 1945-06-27, 67 y.o.   MRN: [id number]  HPI  She was scheduled for a Medicare wellness exam; but she has several concerns. Of greatest concern to her is recurrent involuntary mouth movements that she's had for the last 3 years. This occurs at night while supine. It lasts seconds. It can occur up to 2 times per week but may not be present for months. It is described as a "twitching" which she feels; she has not visualized involuntary movements. She has not treated this or sought treatment for it today due to "denial". She does not identify any specific triggers which aggravate this such as lack of rest or stimulants (decongestants, diet pills, or caffeine). Family history is positive for TIAs in her mother.  Over the past week she's had intermittent diffuse "bad" headaches intermittently, mainly over crown. Now in occiput vs frontal area. This is in the setting of recent dental work on the right. Aleve 1-2 have been of some benefit. The headache is chiefly dull in nature; it is worse when she sleeps in the right lateral decubitus position. This results in a throbbing characteristic in neck & shoulders & top of head.  09/26/12 she had diffuse arthralgias without associated rash with chills but w/o fever or sweats. This was in the context of increased physical activity such as dancing. She does have a history of vertigo; she has not had vertiginous symptoms prior to the headache. PMH of cervical disc disease.  She has a past medical history of hypothyroidism, hypercalcemia, and osteopenia.   Review of Systems  She denies blurred vision, double vision, or loss of vision. She has no associated tinnitus or hearing loss. There is no limb weakness, numbness tingling, or gait dysfunction. She also denies seizure activity or loss of consciousness. There's been no cardiac or neurologic prodrome prior to these symptoms.  She's had no stool  urinary incontinence.     Objective:   Physical Exam  Gen.:  well-nourished in appearance. Alert, appropriate and cooperative throughout exam. Appears younger than stated age  Head: Normocephalic without obvious abnormalities  Eyes: No corneal or conjunctival inflammation noted. Pupils small but equally round reactive to light and accommodation. FOV intact. Extraocular motion intact. Vision grossly normal with lenses Ears: External  ear exam reveals no significant lesions or deformities. Canals clear .TMs normal. Hearing is grossly normal bilaterally. Tuning fork exam is normal  Nose: External nasal exam reveals no deformity or inflammation. Nasal mucosa are pink and moist. No lesions or exudates noted.  Mouth: Oral mucosa and oropharynx reveal no lesions or exudates. Teeth in good repair. Upper partial Neck: No deformities, masses, or tenderness noted. Range of motion decreased. Thyroid normal. Lungs: Normal respiratory effort; chest expands symmetrically. Lungs are clear to auscultation without rales, wheezes, or increased work of breathing. Heart: Normal rate and rhythm. Normal S1 and S2. No gallop, click, or rub. S4 ; no murmur. Abdomen: Bowel sounds normal; abdomen soft and nontender. No masses, organomegaly or hernias noted.                              Musculoskeletal/extremities: No deformity or scoliosis noted of  the thoracic or lumbar spine.  No clubbing, cyanosis, edema, or significant extremity  deformity noted. Range of motion normal .Tone & strength  Normal. Joints normal except for toe deformities on R. Nail health good. Able to  lie down & sit up w/o help. Negative SLR bilaterally. Vascular: Carotid, radial artery, dorsalis pedis and  posterior tibial pulses are full and equal. No bruits present. Neurologic: Alert and oriented x3. Deep tendon reflexes symmetrical and normal.  Gait normal  including heel & toe walking . Romberg and finger to nose testing normal. No sign of  meningismus   Skin: Intact without suspicious lesions or rashes. Lymph: No cervical, axillary lymphadenopathy present. Psych: Mood and affect are slightly flat. Normally interactive                                                                                        Assessment & Plan:  #1 ; the infrequent nature pulmonate delineation and treatment somewhat difficult. Certainly she should avoid stimulants and he is much rest as possible. Consultation with Dr. Arbutus Leas should be considered as this is of great concern to her  #2 diffuse headache which is now mainly occipital and associated with neck and shoulder symptoms. No neurologic deficit present  #3 diffuse arthralgias  Plan: See orders and recommendations

## 2012-10-02 ENCOUNTER — Other Ambulatory Visit: Payer: Medicare Other

## 2012-10-02 ENCOUNTER — Ambulatory Visit: Payer: Medicare Other

## 2012-10-02 DIAGNOSIS — R259 Unspecified abnormal involuntary movements: Secondary | ICD-10-CM

## 2012-10-02 DIAGNOSIS — IMO0001 Reserved for inherently not codable concepts without codable children: Secondary | ICD-10-CM

## 2012-10-02 LAB — CK: Total CK: 33 U/L (ref 7–177)

## 2012-10-03 ENCOUNTER — Telehealth: Payer: Self-pay | Admitting: *Deleted

## 2012-10-03 MED ORDER — PREDNISONE 5 MG PO TABS: 15.0000 mg | ORAL_TABLET | Freq: Every day | ORAL | Status: DC

## 2012-10-03 NOTE — Telephone Encounter (Addendum)
Pt indicated that she has been trying to send you a mychart message for 2 days and has spoken with help desk and our office with no results. Pt states that she will bring a copy of what she was trying to send to you on my chart. Pt notes that this info is just background info on when and how long symptoms have been presents.     Pt aware Rx sent and mychart message sent as well with instruction.

## 2012-10-03 NOTE — Telephone Encounter (Signed)
Message copied by Verdene Rio on Fri Oct 03, 2012  5:05 PM ------      Message from: Pecola Lawless      Created: Fri Oct 03, 2012  4:37 PM       We still do not have the followup calcium and parathyroid level results; but I do want to start prednisone 5 mg 3 pills daily (#30) to treat what clinically appears to be polymyalgia rheumatica. Take 3 pills each day with a meal. If this is polymyalgia rheumatica there should be dramatic improvement in the head/neck, and hip symptoms. ------

## 2012-10-05 LAB — VITAMIN D 1,25 DIHYDROXY: Vitamin D 1, 25 (OH)2 Total: 174 pg/mL — ABNORMAL HIGH (ref 18–72)

## 2012-10-06 ENCOUNTER — Other Ambulatory Visit: Payer: Self-pay | Admitting: Internal Medicine

## 2012-10-06 DIAGNOSIS — R7989 Other specified abnormal findings of blood chemistry: Secondary | ICD-10-CM

## 2012-10-06 LAB — PTH, INTACT AND CALCIUM
Calcium: 10.5 mg/dL (ref 8.4–10.5)
PTH: 132.6 pg/mL — ABNORMAL HIGH (ref 14.0–72.0)

## 2012-10-06 NOTE — Telephone Encounter (Signed)
Patient called in stating that she would like a callback from [facility]. [facility] #s [phone] or [phone]

## 2012-10-06 NOTE — Telephone Encounter (Signed)
Pt wanted to get some clarity on labs done on 10-01-12 and 10-02-12. Reviewed labs with Pt and advise that we are still awaiting 1 test but will inform her as soon as we get the results. Pt to bring in background info on symptoms today.

## 2012-10-08 ENCOUNTER — Ambulatory Visit (HOSPITAL_BASED_OUTPATIENT_CLINIC_OR_DEPARTMENT_OTHER)
Admission: RE | Admit: 2012-10-08 | Discharge: 2012-10-08 | Disposition: A | Discharge status: Home / Self Care | Payer: Medicare Other | Source: Ambulatory Visit | Attending: Internal Medicine | Admitting: Internal Medicine

## 2012-10-08 DIAGNOSIS — R7989 Other specified abnormal findings of blood chemistry: Secondary | ICD-10-CM

## 2012-10-08 DIAGNOSIS — E049 Nontoxic goiter, unspecified: Secondary | ICD-10-CM | POA: Insufficient documentation

## 2012-10-09 ENCOUNTER — Telehealth: Payer: Self-pay | Admitting: Internal Medicine

## 2012-10-09 ENCOUNTER — Other Ambulatory Visit (INDEPENDENT_AMBULATORY_CARE_PROVIDER_SITE_OTHER): Payer: Medicare Other

## 2012-10-09 DIAGNOSIS — M2550 Pain in unspecified joint: Secondary | ICD-10-CM

## 2012-10-09 DIAGNOSIS — M542 Cervicalgia: Secondary | ICD-10-CM

## 2012-10-09 LAB — SEDIMENTATION RATE: Sed Rate: 35 mm/hr — ABNORMAL HIGH (ref 0–22)

## 2012-10-09 NOTE — Telephone Encounter (Signed)
Left message on voicemail for patient to return call when available   

## 2012-10-09 NOTE — Telephone Encounter (Signed)
Questions about thyroid med and fyi on eye drops from eye doctor. Pt would like to talk with you.

## 2012-10-10 ENCOUNTER — Telehealth: Payer: Self-pay | Admitting: Internal Medicine

## 2012-10-10 ENCOUNTER — Other Ambulatory Visit (HOSPITAL_BASED_OUTPATIENT_CLINIC_OR_DEPARTMENT_OTHER): Payer: Medicare Other

## 2012-10-10 NOTE — Telephone Encounter (Signed)
Pt states that she wanted to know if she should continue to take her thyroid med. Pt notes that Alfonse Flavors was doing all these test on her thyroid and was unsure if she still needed this med. .Please advise

## 2012-10-10 NOTE — Telephone Encounter (Signed)
Pt aware.

## 2012-10-10 NOTE — Telephone Encounter (Signed)
Thyroid seems to be well-controlled, recommend to continue with synthroid until next visit w/ [facility]

## 2012-10-10 NOTE — Telephone Encounter (Signed)
Pt called wanting to know about her call she placed yesterday and also needed to schedule an appt - there aren't any opening until the 17th and she thought that would be too far away- I can use same day but need approval.

## 2012-10-10 NOTE — Telephone Encounter (Signed)
Per Chrae ok to use same day appt.

## 2012-10-10 NOTE — Telephone Encounter (Signed)
Please advise on appt.  

## 2012-10-14 ENCOUNTER — Ambulatory Visit: Payer: Medicare Other | Admitting: Internal Medicine

## 2012-10-16 ENCOUNTER — Telehealth: Payer: Self-pay | Admitting: *Deleted

## 2012-10-16 NOTE — Telephone Encounter (Signed)
Left msg to change appt to 3:00 this afternoon w/ Dr. Alwyn Ren if convenient for pt as he has pm openings today. Pt currently scheduled for tomorrow at 3:00

## 2012-10-17 ENCOUNTER — Encounter: Payer: Self-pay | Admitting: Internal Medicine

## 2012-10-17 ENCOUNTER — Ambulatory Visit (INDEPENDENT_AMBULATORY_CARE_PROVIDER_SITE_OTHER): Payer: Medicare Other | Admitting: Internal Medicine

## 2012-10-17 DIAGNOSIS — R7989 Other specified abnormal findings of blood chemistry: Secondary | ICD-10-CM

## 2012-10-17 DIAGNOSIS — K219 Gastro-esophageal reflux disease without esophagitis: Secondary | ICD-10-CM

## 2012-10-17 DIAGNOSIS — IMO0001 Reserved for inherently not codable concepts without codable children: Secondary | ICD-10-CM

## 2012-10-17 DIAGNOSIS — M2550 Pain in unspecified joint: Secondary | ICD-10-CM

## 2012-10-17 DIAGNOSIS — E041 Nontoxic single thyroid nodule: Secondary | ICD-10-CM

## 2012-10-17 DIAGNOSIS — E215 Disorder of parathyroid gland, unspecified: Secondary | ICD-10-CM

## 2012-10-17 DIAGNOSIS — H2000 Unspecified acute and subacute iridocyclitis: Secondary | ICD-10-CM | POA: Insufficient documentation

## 2012-10-17 MED ORDER — PREDNISONE 5 MG PO TABS: 15.0000 mg | ORAL_TABLET | Freq: Every day | ORAL | Status: DC

## 2012-10-17 MED ORDER — OMEPRAZOLE 20 MG PO CPDR: 20.0000 mg | DELAYED_RELEASE_CAPSULE | Freq: Every day | ORAL | Status: DC

## 2012-10-17 NOTE — Progress Notes (Signed)
  Subjective:    Patient ID: Gail Fuentes, female    DOB: 05/08/1945, 67 y.o.   MRN: [id number]  HPI She provided a written description of her symptoms after reviewing her record of 5/28. Her symptoms began 09/13/12 has pain above her eyes and in the right maxillary sinus area. She also had dental pain more so on the right than the left. The symptoms were associated with headache and stiff neck.  The symptoms appeared [place].  Subsequently she developed lower and upper back pain; chills; sweats 09/18/12.  She states that the arthralgias/myalgias were 80-90% improved with the low-dose prednisone. Her sedimentation rate was dramatically elevated at 86  Additionally she was found to have hypercalcemia and elevated parathyroid hormone levels. Ultrasound of the neck did not show any parathyroid nodules.    Review of Systems She is being treated for acute iritis in both eyes with prednisone.  With interventions to date her "brain fog" has improved.      Objective:   Physical Exam Gen.: well-nourished in appearance. Alert, appropriate and cooperative throughout exam. Head: Normocephalic without obvious abnormalities Eyes: No corneal or conjunctival inflammation noted. Ptosis OD Neck: No deformities, masses, or tenderness noted. Range of motion good. Thyroid normal. Lungs: Normal respiratory effort; chest expands symmetrically. Lungs are clear to auscultation without rales, wheezes, or increased work of breathing. Heart: Normal rate and rhythm. Normal S1 and S2. No gallop, click, or rub. Grade 1/6 systolic murmur. Abdomen: Bowel sounds normal; abdomen soft and nontender. No masses, organomegaly or hernias noted.                                 Musculoskeletal/extremities: Accentuated curvature of upper thoracic spine.  No clubbing, cyanosis, edema, or significant extremity  deformity noted. Range of motion normal .Tone  & strength  Normal. Joints normal . Nail health good. Able to lie down & sit up w/o help.  Vascular: Carotid, radial artery, dorsalis pedis and  posterior tibial pulses are full and equal. No bruits present. Neurologic: Alert and oriented x3. Deep tendon reflexes symmetrical and normal.       Skin: Intact without suspicious lesions or rashes. Lymph: No cervical, axillary lymphadenopathy present. Psych: Mood and affect are normal. Normally interactive                                                                                        Assessment & Plan:  #1 the diffuse myalgias and arthralgias; extremely high sedimentation rate; and dramatic response to low-dose prednisone suggest polymyalgia rheumatica  #2 the elevated calcium and elevated PTH levels suggest hyperparathyroidism. Ultrasound and neck showed only an incidental thyroid nodule which should be monitored in 12 months.  Plan: She'll be given a prescription for prednisone should be myalgias/arthralgias return.  Fasting intact PTH level with simultaneous calcium level will be rechecked along with sedimentation rate. If the PTH level was still elevated a nuclear scan to isolate a possible parathyroid adenoma will be pursued

## 2012-10-17 NOTE — Patient Instructions (Addendum)
Fasting Labs : intact PTH , Ca++ & sed rate @ 8 am next week

## 2012-10-20 ENCOUNTER — Other Ambulatory Visit (INDEPENDENT_AMBULATORY_CARE_PROVIDER_SITE_OTHER): Payer: Medicare Other

## 2012-10-20 DIAGNOSIS — IMO0001 Reserved for inherently not codable concepts without codable children: Secondary | ICD-10-CM

## 2012-10-20 DIAGNOSIS — R7989 Other specified abnormal findings of blood chemistry: Secondary | ICD-10-CM

## 2012-10-20 DIAGNOSIS — M2550 Pain in unspecified joint: Secondary | ICD-10-CM

## 2012-10-21 ENCOUNTER — Other Ambulatory Visit: Payer: Self-pay | Admitting: Internal Medicine

## 2012-10-21 DIAGNOSIS — R7989 Other specified abnormal findings of blood chemistry: Secondary | ICD-10-CM

## 2012-10-21 LAB — PTH, INTACT AND CALCIUM: PTH: 238.7 pg/mL — ABNORMAL HIGH (ref 14.0–72.0)

## 2012-10-22 NOTE — Addendum Note (Signed)
Addended by: Maurice Small on: 10/22/2012 03:02 PM   Modules accepted: Orders

## 2012-10-28 ENCOUNTER — Other Ambulatory Visit: Payer: Self-pay | Admitting: Internal Medicine

## 2012-10-28 DIAGNOSIS — R7989 Other specified abnormal findings of blood chemistry: Secondary | ICD-10-CM

## 2012-10-28 DIAGNOSIS — E349 Endocrine disorder, unspecified: Secondary | ICD-10-CM | POA: Insufficient documentation

## 2012-10-29 ENCOUNTER — Other Ambulatory Visit: Payer: Self-pay | Admitting: Internal Medicine

## 2012-10-29 ENCOUNTER — Telehealth: Payer: Self-pay | Admitting: Internal Medicine

## 2012-10-29 DIAGNOSIS — R7989 Other specified abnormal findings of blood chemistry: Secondary | ICD-10-CM

## 2012-10-29 NOTE — Telephone Encounter (Signed)
Dr. Alwyn Ren, I see you have entered a new order for this patient to have the Parathyroid nuclear exam, however, I am unable to determine the cpt code needing to be used for pre-cert.  I have the denial letter from patient's insurance stating the better test is the above.  I contacted insurance company asked them to tell me exactly what the cpt code is we are supposed to use to order this test.  The gentleman, told me he doesn't have what I'm looking at, and he gave me 3 different cpt codes we are to choose from.  Cpt [id number] is Spec CT, cpt [id number] is with & without, and cpt 78070 is regular (he thinks this means without).  I have been on the phone with the [place] dept at the hospital and been transferred to different people, and no one has been able to help me with this.  At this point, I know of no other means of ordering the better test, and what that cpt code is.  Please advise.

## 2012-10-30 NOTE — Telephone Encounter (Signed)
Per Dr. Alwyn Ren, I will resubmit request for prior authorzition using cpt [phone].

## 2012-10-30 NOTE — Telephone Encounter (Signed)
Researched issue and it appears that the CPT code needed is [phone]? Copy given to [facility] and shown to Dr. Alwyn Ren who agrees to try CPT code 60454.

## 2012-11-03 ENCOUNTER — Telehealth: Payer: Self-pay | Admitting: Internal Medicine

## 2012-11-03 ENCOUNTER — Other Ambulatory Visit: Payer: Self-pay | Admitting: Internal Medicine

## 2012-11-03 DIAGNOSIS — R7989 Other specified abnormal findings of blood chemistry: Secondary | ICD-10-CM

## 2012-11-03 NOTE — Telephone Encounter (Addendum)
(  See 10-29-12 phone note) I have prior-approval from patient's insurance company for the NM Parathyroid Planar to be performed at [facility].  Per my call to [facility] scheduling, they do not schedule the test the way, only the way that the patient's insurance previously denied, the NM Parathyroid w/Spect.  I informed them that we were unable to get insurance to approve that study, but they will not schedule.  Per Dr. Amil Amen in radiology, our office needs to do what ever we can to get the denied test approved.  I have already done everything possible that I know to do.  Per Dr. Amil Amen, Dr. Alwyn Ren may call and speak with her about this, phone [phone].  Please advise.

## 2012-11-03 NOTE — Telephone Encounter (Signed)
   This situation of insurance company preauthorizing a study that [facility] radiology will not perform should be referred to risk management as well as medical peer review. The patient may have a parathyroid adenoma and this needs to be evaluated as soon as possible. I will request an endocrine consultation to see if they can eliminate  this roadblock.

## 2012-11-04 ENCOUNTER — Telehealth: Payer: Self-pay

## 2012-11-04 ENCOUNTER — Ambulatory Visit (INDEPENDENT_AMBULATORY_CARE_PROVIDER_SITE_OTHER): Payer: Medicare Other | Admitting: Internal Medicine

## 2012-11-04 ENCOUNTER — Encounter: Payer: Self-pay | Admitting: Internal Medicine

## 2012-11-04 DIAGNOSIS — M899 Disorder of bone, unspecified: Secondary | ICD-10-CM

## 2012-11-04 DIAGNOSIS — E559 Vitamin D deficiency, unspecified: Secondary | ICD-10-CM

## 2012-11-04 NOTE — Telephone Encounter (Signed)
Message copied by Maurice Small on Tue Nov 04, 2012  2:12 PM ------      Message from: Pecola Lawless      Created: Tue Nov 04, 2012  1:32 PM       I am sorry but her insurance company would not authorize the parathyroid gland imaging which is performed @ [facility]; therefore I have referred her to endocrinology. Perhaps they can end this impasse. We need to get this evaluation completed. ------

## 2012-11-04 NOTE — Patient Instructions (Addendum)
Please return in 4 months. Please perform a 24 hour urine collection. I will send you the results through MyChart.

## 2012-11-04 NOTE — Telephone Encounter (Signed)
Spoke with patient, patient states she was currently in the endocrinologist office at the time I was calling her, Dr.Hopper spoke with the Endo doctor directly via the patients cell phone and explained that insurance company would not cover recommended scan

## 2012-11-04 NOTE — Progress Notes (Signed)
Subjective:     Patient ID: Gail Fuentes, female   DOB: 05/18/1945, 67 y.o.   MRN: [id number]  HPI Gail Fuentes is a pleasant 67 y/o referred by Dr. Alwyn Ren for evaluation and management of recently dx hyperparathyroidism.  Pt's hypercalcemia was found recently. Reviewed previous PTH and Ca levels - calcium mildly elevated x 2: Lab Results  Component Value Date   PTH 238.7* 10/20/2012   PTH 132.6* 10/02/2012   CALCIUM 10.2 10/20/2012   CALCIUM 10.5 10/02/2012   CALCIUM 10.9* 10/01/2012   CALCIUM 10.7* 12/27/2011   CALCIUM 10.5 09/21/2010   CALCIUM 10.2 12/27/2008   CALCIUM 10.4 07/23/2007   CALCIUM 10.2 05/13/2006   Pt had a thyroid ultrasound showing only a thyroid nodule. Dr Alwyn Ren ordered a sestamibi scan which was initially preapproved by her insurance but not offered by radiology.   Pt denies neck compression sxs; dysphagia/odynophagia, hoarseness, shortness of breath with lying down.  She does not have a history of kidney stones, osteoporosis, fractures, has had constipation for a long time, but alternating with diarrhea, no abdominal pain, had generalized pain (also ESR found high much improved with prednisone), no hypertension, has vertigo. She has anxiety and depression, the latter dx 14 years ago, when she started to go through a lot of family stress and a lot of stress at work.   She was dx with osteopenia, but not osteoporosis. DEXA: last 2011. Reviewed the record of the DEXA scan results in Epic - previous DEXA 01/23/2010 [facility]):  L1-4 T score -2.0  Left femoral neck T score -1.9  Right femoral neck T score -1.4 Her mother, sister, brother - all have osteoporosis.   No h/o kidney ds: Lab Results  Component Value Date   BUN 9 10/01/2012   CREATININE 0.8 10/01/2012   She has a h/o vit D def >> was on Ergo 50,000 IU once a week for 1-2 years. She also took 1000 units of vit D3 daily, too. She was taken off the calcium and vitamin D. She took several few Tums 1 mo ago for  reflux. She was on calcium supplements in the past, but she was forgetting it and also got nausea with it.  No FH of calcium abnormalities. Has has a FH of cardiac problems. Father AMI at 73. Pt was evaluated by Dr. Daleen Squibb in the past for abnormal ekg's >> but was told there were no cardiac problems. She had a normal 2D Echo.   Review of Systems Constitutional: + weight gain, decreased appetite, + fatigue, no subjective hyperthermia/hypothermia, poor sleep Eyes:+ blurry vision, no xerophthalmia ENT: + sore throat, +  nodules palpated in throat, no dysphagia/odynophagia, no hoarseness Cardiovascular: no CP/SOB/+palpitations/+leg swelling Respiratory: + all: cough/SOB/wheezing Gastrointestinal: + all: N/V/D/C/GERD Musculoskeletal: + all:  muscle/joint aches Skin: no rashes, + easy bruising, + Itching Neurological: no tremors/numbness/tingling/dizziness Psychiatric: no depression/anxiety Low libido Past Medical History  Diagnosis Date  . Hyperlipidemia   . Vitamin D deficiency   . Benign paroxysmal positional vertigo   . Other abnormal glucose   . Depressive disorder, not elsewhere classified   . Anxiety state, unspecified   . Unspecified hypothyroidism   . Esophageal reflux   . Personal history of other diseases of digestive system     12-15 since 1990, no recurrence 2006  . Allergy     sneezing  late Fall  . Anemia 01/23/12    H/H 11.7/35   Past Surgical History  Procedure Laterality Date  . Dental surgery  Age 67-Abscessed Teeth Extracted   . Cesarean section    . Colonoscopy  12/2003    Tics  . Total abdominal hysterectomy w/ bilateral salpingoophorectomy      Ovarian Cyst   History   Social History  . Marital Status: Married    Spouse Name: N/A    Number of Children: 1, 69 y/o  . Years of Education: N/A   Occupational History  . cleans offices and homes    Social History Main Topics  . Smoking status: Former Smoker    Quit date: 05/07/1973  . Smokeless  tobacco: Not on file     Comment: 19-23 up to 2 ppd  . Alcohol Use: 7.0 oz/week    14 drink(s) per week     Comment: 2 nightly  . Drug Use: No  . Sexually Active: Not on file   Other Topics Concern  . Not on file   Social History Narrative   Regular exercise: no   Caffeine use: daily   Current Outpatient Prescriptions on File Prior to Visit  Medication Sig Dispense Refill  . ALPRAZolam (XANAX) 0.5 MG tablet Take 0.5 mg by mouth. 1 by mouth three times daily as needed, RX'ed by Dr.Neal      . estrogens, conjugated, (PREMARIN) 0.3 MG tablet Take 0.3 mg by mouth daily. 1 by mouth daily      . estrogens, conjugated, (PREMARIN) 0.45 MG tablet Take 0.45 mg by mouth daily. Take daily for 21 days then do not take for 7 days.      . fexofenadine (ALLEGRA) 180 MG tablet Take 180 mg by mouth daily.      [name] FLUoxetine (PROZAC) 40 MG capsule Take 40 mg by mouth daily. Dr.Neal      . fluticasone (FLONASE) 50 MCG/ACT nasal spray Place 1 spray into the nose 2 (two) times daily as needed for rhinitis.  16 g  2  . levothyroxine (SYNTHROID, LEVOTHROID) 125 MCG tablet One by mouth daily" Brand Name"  90 tablet  0  . omeprazole (PRILOSEC) 20 MG capsule Take 1 capsule (20 mg total) by mouth daily.  90 capsule  3  . prednisoLONE acetate (PRED FORTE) 1 % ophthalmic suspension Place 1 drop into both eyes 4 (four) times daily.      . predniSONE (DELTASONE) 5 MG tablet Take 3 tablets (15 mg total) by mouth daily. with a meal  90 tablet  0  . Probiotic Product (ALIGN PO) Take by mouth daily.       No current facility-administered medications on file prior to visit.   No Known Allergies  Family History  Problem Relation Age of Onset  . Coronary artery disease Father   . Heart attack Father 76  . COPD Father   . Diabetes Mother   . Dysrhythmia Mother   . Depression Mother   . COPD Mother   . Prostate cancer Brother   . Hypertension Brother   . Cancer Brother     Soft tissue in the face/temple  . Other  Brother     Wegener's in sinus; [facility]      Objective:   Physical Exam BP 124/70  Pulse 71  Temp(Src) 97.5 F (36.4 C) (Oral)  Resp 12  Ht 5\' 5"  (1.651 m)  Wt 211 lb (95.709 kg)  BMI 35.11 kg/m2  SpO2 97% Wt Readings from Last 3 Encounters:  10/17/12 207 lb (93.895 kg)  10/01/12 206 lb (93.441 kg)  02/20/12 206 lb 6.4 oz (93.622 kg)  Constitutional: overweight, in NAD Eyes: PERRLA, EOMI, no exophthalmos ENT: moist mucous membranes, no thyromegaly, no cervical lymphadenopathy Cardiovascular: RRR, No MRG Respiratory: CTA B Gastrointestinal: abdomen soft, NT, ND, BS+ Musculoskeletal: no deformities, strength intact in all 4 Skin: moist, warm, no rashes Neurological: no tremor with outstretched hands, DTR normal in all 4     Assessment:     1. Hypercalcemia - < 1 mg/dl above normal - elevated PTH - normal vit D  - elevated 1,25 vitamin D - no renal failure - osteopenia  - no h/o kidney stones - previous DEXA 01/23/2010 ([facility]):  L1-4 T score -2.0  Left femoral neck T score -1.9  Right femoral neck T score -1.4  10 year any fracture risk 13.8%, 10 year hip fracture risk 2.9%    Plan:     1. Hyperparathyroidism: Pt with recently found hyperparathyroidism, with minimally elevated calcium levels in 2 occasions, but most Calcium levels normal. We discussed about that her PTH is, indeed high, especially at last check, although for the latest measurement, she has been on Prednisone, which is known to decrease calcium levels and, conceivably, increase the PTH.  - we discussed that a high PTH can be due to a parathyroid adenoma (primary HPTH) or secondary to a decrease in calcium, vitamin D, increase in phosphorus. We need to make sure her PTH increase is not physiologic, adaptive, as for e.g. in: - vitamin D deficiency (had a normal 25 HO vit D last year - will repeat, a 1,25 HO vit D is elevated, at 174 - possibly by the increased PTH) - low calcium intake (took calcium  supplements but could not tolerate them 2/2 nausea) - renal failure (last GFR normal 1 mo ago) - diuretics (not using) - Bisphosphonates (not using) - loss of calcium in urine (will check 24h urinary calcium) - pancreatic ds with calcium malabsorption (no pancreatic ds) We also need to make sure she did not developed OP, will repeat DEXA. Also, to make sure she is not hypophosphatemic, check phosphorus. - She does not meet the the current criteria for parathyroid surgery: Increased calcium by more than 1 mg/dL above the upper limit of normal  GFR <60  Osteoporosis  Age <22 years old In this case, since her PTH is still elevated for the calcium levels, will try to decide for or against surgery based on urinary calcium (if elevated), phosphorus level (if low) or DEXA scan (if developed OP). - Her generalized pain has greatly improved on Prednisone, and was associated with an increased ESR, so DR. Hopper suspects PMR. I agree, this is likely not related to her hypercalcemia, especially since her calcium is minimally elevated. She does not have kidney stones, abdominal pain, and her depression is going on for ~15 years and is related to her being the primary care giver for her parents, and, after the death of her mother 6 mo ago, for her brother. - If we decide for parathyroid, then I will refer to surgery and will defer to the surgeon to decide for or against imaging tests - In case lab results are not worrisome, I will see her back in 4 months to repeat labs   Office Visit on 11/04/2012  Component Date Value Range Status  . Phosphorus 11/04/2012 2.3  2.3 - 4.6 mg/dL Final  . Vit D, [id number]-[id number] 11/04/2012 35  30 - 89 ng/mL Final   Comment: This assay accurately quantifies Vitamin D, which is the sum of the  25-Hydroxy forms of Vitamin D2 and D3.  Studies have shown that the                          optimum concentration of 25-Hydroxy Vitamin D is 30 ng/mL or higher.                            Concentrations of Vitamin D between 20 and 29 ng/mL are considered to                          be insufficient and concentrations less than 20 ng/mL are considered                          to be deficient for Vitamin D.   Will await urinary calcium. Will order new DEXA to include 33% radius.  Component     Latest Ref Rng 11/06/2012  Calcium, Ur      10  Urine calcium     100 - 250 mg/day 300 (H)  Creatinine, Urine      39.5  Creatinine, 24H Ur     700 - 1800 mg/day 1185  Urinary calcium <4 mg/kg, but >250 mg/d.  Msg sent to pt: Dear Gail Fuentes, Here are the latest results:  - urinary calcium is a little high (possibly from the high PTH) - Phosphorus level is at the lower limit of normal also possibly from high PTH) - vitamin D is normal I got the records from your last 2 DEXA scans and I ordered a new one for this year. If this shows lower scores, I think I would want to refer you to surgery and let the surgeon decide if there is need for further imaging testing for a parathyroid adenoma or can do exploratory surgery. I will let you know about the DEXA result as soon as it becomes available to me.  In the meantime, please try to calculate to get at least 600 mg calcium from your diet daily. Please let me know if you have any questions. Sincerely, Carlus Pavlov MD  New DEXA 11/14/2012 ([facility]):  L1-4 T score -2.0 >> -2.1 (but high SD, L1 T score is -2.8)  Left femoral neck T score -1.9 >> -1.9  Right femoral neck T score -1.4 >> -1.7  33% distal radius: -1.5  But worrisome that UD radius -3.1 (good evaluation for trabecular bone) I think we can consider her osteoporotic based on the ultradistal radius measurement.   I will discuss with her to ask for a second opinion from surgery.

## 2012-11-06 ENCOUNTER — Other Ambulatory Visit: Payer: Medicare Other

## 2012-11-06 DIAGNOSIS — M949 Disorder of cartilage, unspecified: Secondary | ICD-10-CM

## 2012-11-06 DIAGNOSIS — E559 Vitamin D deficiency, unspecified: Secondary | ICD-10-CM

## 2012-11-07 LAB — CREATININE, URINE, 24 HOUR: Creatinine, 24H Ur: 1185 mg/d (ref 700–1800)

## 2012-11-07 LAB — CALCIUM, URINE, 24 HOUR: Calcium, Ur: 10 mg/dL

## 2012-11-11 ENCOUNTER — Telehealth: Payer: Self-pay | Admitting: *Deleted

## 2012-11-11 NOTE — Telephone Encounter (Signed)
Called Radiology [facility] office) and scheduled DEXA (+33% distal radius) scan for pt. Called pt and gave her the date and time: Friday, July 11th at 10:30 am. Advised pt to be there at 10:15 am and to not wear any metal buttons or zippers. Pt understood.

## 2012-11-14 ENCOUNTER — Other Ambulatory Visit: Payer: Self-pay | Admitting: Internal Medicine

## 2012-11-14 ENCOUNTER — Ambulatory Visit (INDEPENDENT_AMBULATORY_CARE_PROVIDER_SITE_OTHER)
Admission: RE | Admit: 2012-11-14 | Discharge: 2012-11-14 | Disposition: A | Discharge status: Home / Self Care | Payer: Medicare Other | Source: Ambulatory Visit | Attending: Internal Medicine | Admitting: Internal Medicine

## 2012-11-14 DIAGNOSIS — E559 Vitamin D deficiency, unspecified: Secondary | ICD-10-CM

## 2012-11-14 DIAGNOSIS — M899 Disorder of bone, unspecified: Secondary | ICD-10-CM

## 2012-11-14 DIAGNOSIS — M949 Disorder of cartilage, unspecified: Secondary | ICD-10-CM

## 2012-11-14 DIAGNOSIS — M810 Age-related osteoporosis without current pathological fracture: Secondary | ICD-10-CM | POA: Insufficient documentation

## 2012-11-20 ENCOUNTER — Encounter: Payer: Self-pay | Admitting: Internal Medicine

## 2012-11-26 ENCOUNTER — Encounter: Payer: Self-pay | Admitting: Internal Medicine

## 2012-11-26 ENCOUNTER — Ambulatory Visit (INDEPENDENT_AMBULATORY_CARE_PROVIDER_SITE_OTHER): Payer: Medicare Other | Admitting: Internal Medicine

## 2012-11-26 VITALS — BP 124/78 | HR 73 | Wt 210.0 lb

## 2012-11-26 DIAGNOSIS — M8580 Other specified disorders of bone density and structure, unspecified site: Secondary | ICD-10-CM | POA: Insufficient documentation

## 2012-11-26 DIAGNOSIS — E213 Hyperparathyroidism, unspecified: Secondary | ICD-10-CM

## 2012-11-26 DIAGNOSIS — E041 Nontoxic single thyroid nodule: Secondary | ICD-10-CM

## 2012-11-26 DIAGNOSIS — M949 Disorder of cartilage, unspecified: Secondary | ICD-10-CM

## 2012-11-26 NOTE — Progress Notes (Signed)
  Subjective:    Patient ID: Gail Fuentes, female    DOB: Jun 16, 1945, 67 y.o.   MRN: [id number]  HPI   We reviewed the ultrasound of thyroid in reference to the thyroid nodule as well as her most recent bone densities  The tiny thyroid nodule simply needs followup in 12 months as there is no increased risk for thyroid cancer  She does have significant osteopenia with a -2.1 score at the spine   Review of Systems  She is not had been dramatic musculoskeletal symptoms as she did 10/01/12 associated with a sedimentation rate of 86. At that time the working diagnosis was polymyalgia rheumatica but subsequent sedimentation rate said been minimally elevated or frankly normal off steroids.  Of significance is a history of osteoporosis in her brother; he was on steroids for Wegener's granulomatosis     Objective:   Physical Exam   She is alert and oriented. Her thyroid was normal to palpation. I feel no lymphadenopathy about the neck or axilla.  We discussed the tiny thyroid nodule and its lack of significance at this time. Repeat ultrasound in 12 months was recommended.  We reviewed her bone densities and discussed the impact of possible hyperparathyroidism on bone integrity as well as its risk in relationship to the heart and GI tract.  She did not comprehend a difference between the thyroid nodule and possible parathyroid adenoma. Anatomy and pathophysiology were discussed  She was concerned about having "exploratory "surgery. The nature of localizing the adenoma was explained.  She also expressed concerns about which surgeon will be consulted. I reassured her that this would be a Careers adviser who is skilled in this specific area. I also stressed that Dr Elvera Lennox would be involved in the perioperative treatment.  All questions and concerns were addressed, apparently to her satisfaction. She stated that she would contact Dr Elvera Lennox to proceed with the surgical consultation.          Assessment & Plan:  See Current Assessment & Plan in Problem List under specific Diagnosis

## 2012-11-26 NOTE — Assessment & Plan Note (Signed)
[name] @ 12 mos

## 2012-11-26 NOTE — Assessment & Plan Note (Signed)
BMD every 25 months 

## 2012-11-27 ENCOUNTER — Other Ambulatory Visit: Payer: Self-pay | Admitting: Internal Medicine

## 2012-11-27 ENCOUNTER — Telehealth: Payer: Self-pay | Admitting: *Deleted

## 2012-11-27 DIAGNOSIS — E349 Endocrine disorder, unspecified: Secondary | ICD-10-CM

## 2012-11-27 NOTE — Telephone Encounter (Signed)
I will place the referral to have Sx weigh in if she needs parathyroid surgery or not. Also if she needs imaging testing or not.

## 2012-11-27 NOTE — Patient Instructions (Signed)
   Please repeat thyroid ultrasound in 12 months to monitor the tiny nodule.

## 2012-11-27 NOTE — Telephone Encounter (Signed)
Patient saw her message on My Chart and is ready for a referral for thyroid surgery.- FYI

## 2012-11-28 NOTE — Telephone Encounter (Signed)
Patient is aware 

## 2012-12-10 ENCOUNTER — Other Ambulatory Visit: Payer: Self-pay

## 2012-12-16 ENCOUNTER — Other Ambulatory Visit: Payer: Self-pay | Admitting: Internal Medicine

## 2012-12-17 ENCOUNTER — Encounter (INDEPENDENT_AMBULATORY_CARE_PROVIDER_SITE_OTHER): Payer: Self-pay | Admitting: Surgery

## 2012-12-17 ENCOUNTER — Ambulatory Visit (INDEPENDENT_AMBULATORY_CARE_PROVIDER_SITE_OTHER): Payer: Medicare Other | Admitting: Surgery

## 2012-12-17 VITALS — BP 132/80 | HR 80 | Temp 97.1°F | Resp 16 | Ht 64.5 in | Wt 209.0 lb

## 2012-12-17 DIAGNOSIS — M8580 Other specified disorders of bone density and structure, unspecified site: Secondary | ICD-10-CM

## 2012-12-17 DIAGNOSIS — M949 Disorder of cartilage, unspecified: Secondary | ICD-10-CM

## 2012-12-17 DIAGNOSIS — E349 Endocrine disorder, unspecified: Secondary | ICD-10-CM

## 2012-12-17 NOTE — Patient Instructions (Signed)
Parathyroid Hormone This is a test to determine whether PTH levels are responding normally to changes in blood calcium levels. It also helps to distinguish the cause of calcium imbalances, and to evaluate parathyroid function. When calcium blood levels are higher or lower than normal, and when your caregiver may want to determine how well your parathyroid glands are working. Parathyroid hormone (PTH) helps the body maintain stable levels of calcium in the blood. It is part of a 'feedback loop' that includes calcium, PTH, vitamin D, and, to some extent, phosphate and magnesium. Conditions and diseases that disrupt this feedback loop can cause inappropriate elevations or decreases in calcium and PTH levels and lead to symptoms of hypercalcemia (raised blood levels of calcium) or hypocalcemia (low blood levels of calcium).  PTH is produced by four parathyroid glands that are located in the neck beside the thyroid gland. Normally, these glands secrete PTH into the bloodstream in response to low blood calcium levels. Parathyroid hormone then works in three ways to help raise blood calcium levels back to normal. It takes calcium from the body's bone, stimulates the activation of vitamin D in the kidney (which in turn increases the absorption of calcium from the intestines), and suppresses the excretion of calcium in the urine (while encouraging excretion of phosphate). As calcium levels begin to increase in the blood, PTH normally decreases. PREPARATION FOR TEST You should have nothing to eat or drink except for water after midnight on the day of the test or as directed by your caregiver. A blood sample is obtained by inserting a needle into a vein in the arm. NORMAL FINDINGS Conventional Normal  PTH intact (whole)  Assay includes intact PTH  Values (pg/mL)10-65  SI Units (ng/L)10-65  PTH N-terminalN-terminal  Values (pg/mL) 8-24  SI Units (ng/L)8-24  PTH C-terminal  Assay Includes  C-terminal  Values (pg/mL) 50-330  SI Units (ng/L) 50-330  Intact PTH  Midmolecule Ranges for normal findings may vary among different laboratories and hospitals. You should always check with your doctor after having lab work or other tests done to discuss the meaning of your test results and whether your values are considered within normal limits. MEANING OF TEST  Your caregiver will go over the test results with you and discuss the importance and meaning of your results, as well as treatment options and the need for additional tests if necessary. OBTAINING THE TEST RESULTS  It is your responsibility to obtain your test results. Ask the lab or department performing the test when and how you will get your results. Document Released: 05/26/2004 Document Revised: 07/16/2011 Document Reviewed: 04/04/2008 Exit[employer] Patient Information 2014 [employer].  

## 2012-12-17 NOTE — Progress Notes (Signed)
General Surgery [facility]  Chief Complaint  Patient presents with  . New Evaluation    eval hyperparathyroidism - referral from Dr. Carlus Pavlov and Dr. Marga Melnick    HISTORY: Patient is a 67 year old female referred by her primary care physician and her endocrinologist for evaluation of suspected primary hyperparathyroidism. Patient has had multiple somatic complaints. Laboratory studies showed an elevated serum calcium level over the past several months ranging from 10.2-10.9. Patient has had intact PTH levels ranging from 130-238. A 24-hour urine collection for calcium was elevated at 300.  Patient denies nephrolithiasis. She does have documented osteopenia on bone density scanning. Patient complains of chronic fatigue. She does complain of bone and joint pain.  Patient has had no prior head or neck surgery. 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 There is no family history of endocrine disease and specifically no history of endocrine neoplasm.  Patient had a thyroid ultrasound performed recently which shows a small, heterogeneous thyroid gland with a solitary right inferior pole nodule measuring 7 mm in size.  Past Medical History  Diagnosis Date  . Hyperlipidemia   . Vitamin D deficiency   . Benign paroxysmal positional vertigo   . Other abnormal glucose   . Depressive disorder, not elsewhere classified   . Anxiety state, unspecified   . Unspecified hypothyroidism   . Esophageal reflux   . Personal history of other diseases of digestive system     12-15 since 1990, no recurrence 2006  . Allergy     sneezing  late Fall  . Anemia 01/23/12    H/H 11.7/35    Current Outpatient Prescriptions  Medication Sig Dispense Refill  . ALPRAZolam (XANAX) 0.5 MG tablet Take 0.5 mg by mouth. 1 by mouth three times daily as needed, RX'ed by Dr.Neal      . estrogens, conjugated, (PREMARIN) 0.45 MG tablet Take 0.45 mg by mouth daily.        [name] FLUoxetine (PROZAC) 40 MG capsule Take 40 mg by mouth daily. Dr.Neal      . levothyroxine (SYNTHROID, LEVOTHROID) 125 MCG tablet TAKE 1 TABLET DAILY  90 tablet  1  . omeprazole (PRILOSEC) 20 MG capsule Take 1 capsule (20 mg total) by mouth daily.  90 capsule  3  . UNABLE TO FIND Med Name: chlorhexadine      . Fexofenadine HCl (ALLEGRA PO) Take by mouth.      . fluticasone (FLONASE) 50 MCG/ACT nasal spray Place 1 spray into the nose 2 (two) times daily as needed for rhinitis.  16 g  2  . Probiotic Product (ALIGN PO) Take by mouth.       No current facility-administered medications for this visit.    No Known Allergies  Family History  Problem Relation Age of Onset  . Coronary artery disease Father   . Heart attack Father 75  . COPD Father   . Diabetes Mother   . Dysrhythmia Mother   . Depression Mother   . COPD Mother   . Prostate cancer Brother   . Other Brother     Wegener's in sinus; [facility]  . Hypertension Brother   . Cancer Brother     prostate  . Cancer Brother     Soft tissue in the face/temple    History   Social History  . Marital Status: Married    Spouse Name: N/A    Number of Children: N/A  . Years of Education: N/A  Occupational History  . cleans offices and homes    Social History Main Topics  . Smoking status: Former Smoker    Quit date: 05/07/1973  . Smokeless tobacco: Never Used     Comment: 19-23 up to 2 ppd  . Alcohol Use: 7.0 oz/week    14 drink(s) per week     Comment: 2 nightly  . Drug Use: No  . Sexual Activity: None   Other Topics Concern  . None   Social History Narrative   Regular exercise: no   Caffeine use: daily    REVIEW OF SYSTEMS - PERTINENT POSITIVES ONLY: Chronic fatigue. Denies significant tremor. Denies palpitations. Denies nephrolithiasis. Bone and joint discomfort.  EXAM: Filed Vitals:   12/17/12 1109  BP: 132/80  Pulse: 80  Temp: 97.1 F (36.2 C)  Resp: 16    HEENT: normocephalic; pupils equal and  reactive; sclerae clear; dentition good; mucous membranes moist NECK:  No palpable nodules within the thyroid bed; symmetric on extension; no palpable anterior or posterior cervical lymphadenopathy; no supraclavicular masses; no tenderness CHEST: clear to auscultation bilaterally without rales, rhonchi, or wheezes CARDIAC: regular rate and rhythm without significant murmur; peripheral pulses are full EXT:  non-tender without edema; no deformity NEURO: no gross focal deficits; no sign of tremor   LABORATORY RESULTS: See [facility] ([facility]-Epic) for most recent results  RADIOLOGY RESULTS: See [facility] (CHL-Epic) for most recent results  IMPRESSION: #1 hypercalcemia, suspected primary hyperparathyroidism #2 personal history of osteopenia #3 right thyroid nodule, 7 mm  PLAN: I discussed all of the above findings at length with the patient and her husband. Based on her laboratory studies and a urinary test, I suspect that she has primary hyperparathyroidism. The next step in her evaluation will be to complete the nuclear medicine parathyroid scan. We will assist in ordering that study. Once those results are available I will contact the patient. If it localizes a parathyroid adenoma, then I believe she will be an excellent candidate for minimally invasive surgery. If the nuclear medicine parathyroid scan is unrevealing, then we will consider an MRI scan in hopes of identifying the parathyroid adenoma preoperatively.  Patient will be scheduled for nuclear medicine parathyroid scan. We will contact her with the results and make further plans at that time.  Velora Heckler, MD, FACS General & Endocrine Surgery [facility]  Primary Care Physician: Marga Melnick, MD

## 2012-12-29 ENCOUNTER — Encounter (HOSPITAL_COMMUNITY)
Admission: RE | Admit: 2012-12-29 | Discharge: 2012-12-29 | Disposition: A | Discharge status: Home / Self Care | Payer: Medicare Other | Source: Ambulatory Visit | Attending: Surgery | Admitting: Surgery

## 2012-12-29 DIAGNOSIS — E349 Endocrine disorder, unspecified: Secondary | ICD-10-CM | POA: Insufficient documentation

## 2012-12-29 DIAGNOSIS — M899 Disorder of bone, unspecified: Secondary | ICD-10-CM | POA: Insufficient documentation

## 2012-12-29 DIAGNOSIS — M8580 Other specified disorders of bone density and structure, unspecified site: Secondary | ICD-10-CM

## 2012-12-29 MED ORDER — TECHNETIUM TC 99M SESTAMIBI - CARDIOLITE
27.0000 | Freq: Once | INTRAVENOUS | Status: AC | PRN
  Administered 2012-12-29: 10:00:00 27 via INTRAVENOUS

## 2013-01-06 ENCOUNTER — Telehealth (INDEPENDENT_AMBULATORY_CARE_PROVIDER_SITE_OTHER): Payer: Self-pay

## 2013-01-06 ENCOUNTER — Telehealth (INDEPENDENT_AMBULATORY_CARE_PROVIDER_SITE_OTHER): Payer: Self-pay | Admitting: General Surgery

## 2013-01-06 NOTE — Telephone Encounter (Signed)
Pt calling for scan results from last week.  Explained that Dr. Gerrit Friends needs to review the scan and either he or his nurse will call her with the results.  She understands and will wait for that call.

## 2013-01-06 NOTE — Telephone Encounter (Signed)
Pt calling for scan result. Pt advised scan in Dr Ardine Eng basket for review. Will call pt once Dr Gerrit Friends has reviewed.

## 2013-01-07 ENCOUNTER — Other Ambulatory Visit (INDEPENDENT_AMBULATORY_CARE_PROVIDER_SITE_OTHER): Payer: Self-pay

## 2013-01-07 DIAGNOSIS — R220 Localized swelling, mass and lump, head: Secondary | ICD-10-CM

## 2013-01-07 DIAGNOSIS — E213 Hyperparathyroidism, unspecified: Secondary | ICD-10-CM

## 2013-01-07 NOTE — Telephone Encounter (Signed)
Pt advised per Dr Ardine Eng request of need for CT. Pt states she understands. CT sched for 01-14-13. Pt will get bun and creat prior to ct. Order to prior approval.

## 2013-01-07 NOTE — Telephone Encounter (Signed)
Sestamibi scan results reviewed.  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  Also question of submandibular mass.  Will get CT scan of neck with IV contrast as suggested by radiologist.  Velora Heckler, MD, [facility] Office: [phone]

## 2013-01-08 LAB — CREATININE, SERUM: Creat: 0.75 mg/dL (ref 0.50–1.10)

## 2013-01-14 ENCOUNTER — Ambulatory Visit
Admission: RE | Admit: 2013-01-14 | Discharge: 2013-01-14 | Disposition: A | Discharge status: Home / Self Care | Payer: Medicare Other | Source: Ambulatory Visit | Attending: Surgery | Admitting: Surgery

## 2013-01-14 DIAGNOSIS — E213 Hyperparathyroidism, unspecified: Secondary | ICD-10-CM

## 2013-01-14 DIAGNOSIS — R220 Localized swelling, mass and lump, head: Secondary | ICD-10-CM

## 2013-01-14 MED ORDER — IOHEXOL 300 MG/ML  SOLN
75.0000 mL | Freq: Once | INTRAMUSCULAR | Status: AC | PRN
  Administered 2013-01-14: 75 mL via INTRAVENOUS

## 2013-01-15 ENCOUNTER — Telehealth (INDEPENDENT_AMBULATORY_CARE_PROVIDER_SITE_OTHER): Payer: Self-pay

## 2013-01-15 NOTE — Telephone Encounter (Signed)
CT result in epic and msg to Dr Gerrit Friends to review and advise f/u.

## 2013-01-22 ENCOUNTER — Telehealth (INDEPENDENT_AMBULATORY_CARE_PROVIDER_SITE_OTHER): Payer: Self-pay

## 2013-01-22 NOTE — Telephone Encounter (Signed)
Returning pts call. Pt advised of CT result. Pt advised I will have Dr Gerrit Friends review result once he returns next week. Pt will be out of town but can be reached on her cell phone to make any plans for surgery if it is indicated. Pt can be reached at [phone].

## 2013-02-02 ENCOUNTER — Other Ambulatory Visit (INDEPENDENT_AMBULATORY_CARE_PROVIDER_SITE_OTHER): Payer: Self-pay | Admitting: Surgery

## 2013-02-02 DIAGNOSIS — E210 Primary hyperparathyroidism: Secondary | ICD-10-CM | POA: Insufficient documentation

## 2013-02-02 NOTE — Telephone Encounter (Signed)
Telephone call to patient with results of nuclear med scan and CT scan.  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  Discussed minimally invasive surgery as an out-patient.  Patient understands and wishes to proceed.  The risks and benefits of the procedure have been discussed at length with the patient.  The patient understands the proposed procedure, potential alternative treatments, and the course of recovery to be expected.  All of the patient's questions have been answered at this time.  The patient wishes to proceed with surgery.  Arline Asp - patient is on vacation until next week.  Please contact next week for scheduling of surgery date.  I will fill out order sheet in office this week.  Epic orders entered today.  Velora Heckler, MD, [facility] Office: [phone]

## 2013-02-03 ENCOUNTER — Telehealth (INDEPENDENT_AMBULATORY_CARE_PROVIDER_SITE_OTHER): Payer: Self-pay

## 2013-02-03 NOTE — Telephone Encounter (Signed)
Posting sheet complete and to surgery schedulers to post.

## 2013-02-05 ENCOUNTER — Encounter (HOSPITAL_COMMUNITY): Payer: Self-pay | Admitting: Pharmacy Technician

## 2013-02-09 ENCOUNTER — Other Ambulatory Visit (HOSPITAL_COMMUNITY): Payer: Self-pay | Admitting: Surgery

## 2013-02-09 NOTE — Patient Instructions (Addendum)
20 MORENIKE CUFF  02/09/2013   Your procedure is scheduled on:  02/13/13  FRIDAY  Report to [facility] at  0530     AM.  Call this number if you have problems the morning of surgery: [phone]       Remember:   Do not eat food  Or drink :After Midnight. Thursday NIGHT   Take these medicines the morning of surgery with A SIP OF WATER: ALLEGRA, FLUOXETINE, LEVOTHYROXINE, OMEPRAZOLE                                           MAY TAKE ALPRAZOLAM IF NEEDED AND USE FLONASE   .  Contacts, dentures or partial plates can not be worn to surgery  Leave suitcase in the car. After surgery it may be brought to your room.  For patients admitted to the hospital, checkout time is 11:00 AM day of  discharge.             SPECIAL INSTRUCTIONS- SEE [place] PREPARING FOR SURGERY INSTRUCTION SHEET-     DO NOT WEAR JEWELRY, LOTIONS, POWDERS, OR PERFUMES.  WOMEN-- DO NOT SHAVE LEGS OR UNDERARMS FOR 12 HOURS BEFORE SHOWERS. MEN MAY SHAVE FACE.  Patients discharged the day of surgery will not be allowed to drive home. IF going home the day of surgery, you must have a driver and someone to stay with you for the first 24 hours  Name and phone number of your driver: husband  Mont Dutton  PST [phone]  [phone]                 FAILURE TO FOLLOW THESE INSTRUCTIONS MAY RESULT IN  CANCELLATION   OF YOUR SURGERY                                                  Patient Signature _____________________________

## 2013-02-10 ENCOUNTER — Encounter (HOSPITAL_COMMUNITY)
Admission: RE | Admit: 2013-02-10 | Discharge: 2013-02-10 | Disposition: A | Discharge status: Home / Self Care | Payer: Medicare Other | Source: Ambulatory Visit | Attending: Surgery | Admitting: Surgery

## 2013-02-10 ENCOUNTER — Encounter (HOSPITAL_COMMUNITY): Payer: Self-pay

## 2013-02-10 ENCOUNTER — Ambulatory Visit (HOSPITAL_COMMUNITY)
Admission: RE | Admit: 2013-02-10 | Discharge: 2013-02-10 | Disposition: A | Discharge status: Home / Self Care | Payer: Medicare Other | Source: Ambulatory Visit | Attending: Surgery | Admitting: Surgery

## 2013-02-10 DIAGNOSIS — E210 Primary hyperparathyroidism: Secondary | ICD-10-CM | POA: Insufficient documentation

## 2013-02-10 DIAGNOSIS — Z01812 Encounter for preprocedural laboratory examination: Secondary | ICD-10-CM | POA: Insufficient documentation

## 2013-02-10 DIAGNOSIS — R9431 Abnormal electrocardiogram [ECG] [EKG]: Secondary | ICD-10-CM | POA: Insufficient documentation

## 2013-02-10 HISTORY — DX: Other specified postprocedural states: Z98.890

## 2013-02-10 HISTORY — DX: Abnormal electrocardiogram (ECG) (EKG): R94.31

## 2013-02-10 HISTORY — DX: Other specified postprocedural states: R11.2

## 2013-02-10 HISTORY — DX: Unspecified osteoarthritis, unspecified site: M19.90

## 2013-02-10 LAB — CBC
Hemoglobin: 13.1 g/dL (ref 12.0–15.0)
MCH: 30.4 pg (ref 26.0–34.0)
MCHC: 33.5 g/dL (ref 30.0–36.0)
MCV: 90.7 fL (ref 78.0–100.0)
Platelets: 275 10*3/uL (ref 150–400)

## 2013-02-10 LAB — BASIC METABOLIC PANEL
BUN: 9 mg/dL (ref 6–23)
Calcium: 10.7 mg/dL — ABNORMAL HIGH (ref 8.4–10.5)
GFR calc non Af Amer: 85 mL/min — ABNORMAL LOW (ref >90)
Glucose, Bld: 116 mg/dL — ABNORMAL HIGH (ref 70–99)

## 2013-02-10 NOTE — Progress Notes (Signed)
Quick Note:  These results are acceptable for scheduled surgery.  Saga Balthazar M. Flor Houdeshell, MD, FACS [facility] Office: [phone]   ______ 

## 2013-02-13 ENCOUNTER — Ambulatory Visit (HOSPITAL_COMMUNITY)
Admission: RE | Admit: 2013-02-13 | Discharge: 2013-02-13 | Disposition: A | Discharge status: Home / Self Care | Payer: Medicare Other | Source: Ambulatory Visit | Attending: Surgery | Admitting: Surgery

## 2013-02-13 ENCOUNTER — Ambulatory Visit (HOSPITAL_COMMUNITY): Payer: Medicare Other | Admitting: Anesthesiology

## 2013-02-13 ENCOUNTER — Encounter (HOSPITAL_COMMUNITY): Payer: Self-pay | Admitting: *Deleted

## 2013-02-13 ENCOUNTER — Encounter (HOSPITAL_COMMUNITY)
Admission: RE | Disposition: A | Discharge status: Home / Self Care | Payer: Self-pay | Source: Ambulatory Visit | Attending: Surgery

## 2013-02-13 ENCOUNTER — Encounter (HOSPITAL_COMMUNITY): Payer: Medicare Other | Admitting: Anesthesiology

## 2013-02-13 DIAGNOSIS — E210 Primary hyperparathyroidism: Secondary | ICD-10-CM

## 2013-02-13 DIAGNOSIS — E349 Endocrine disorder, unspecified: Secondary | ICD-10-CM | POA: Diagnosis present

## 2013-02-13 DIAGNOSIS — E039 Hypothyroidism, unspecified: Secondary | ICD-10-CM | POA: Insufficient documentation

## 2013-02-13 DIAGNOSIS — K219 Gastro-esophageal reflux disease without esophagitis: Secondary | ICD-10-CM | POA: Insufficient documentation

## 2013-02-13 HISTORY — PX: PARATHYROIDECTOMY: SHX19

## 2013-02-13 SURGERY — PARATHYROIDECTOMY
Anesthesia: General | Site: Neck | Laterality: Left | Wound class: Clean

## 2013-02-13 MED ORDER — 0.9 % SODIUM CHLORIDE (POUR BTL) OPTIME
TOPICAL | Status: DC | PRN
  Administered 2013-02-13: 1000 mL

## 2013-02-13 MED ORDER — HYDROMORPHONE HCL PF 1 MG/ML IJ SOLN
INTRAMUSCULAR | Status: AC
  Filled 2013-02-13: qty 1

## 2013-02-13 MED ORDER — BUPIVACAINE HCL (PF) 0.25 % IJ SOLN
INTRAMUSCULAR | Status: AC
  Filled 2013-02-13: qty 30

## 2013-02-13 MED ORDER — ONDANSETRON HCL 4 MG/2ML IJ SOLN
INTRAMUSCULAR | Status: DC | PRN
  Administered 2013-02-13: 4 mg via INTRAMUSCULAR

## 2013-02-13 MED ORDER — LACTATED RINGERS IV SOLN
INTRAVENOUS | Status: DC | PRN
  Administered 2013-02-13 (×2): via INTRAVENOUS

## 2013-02-13 MED ORDER — METOCLOPRAMIDE HCL 5 MG/ML IJ SOLN
INTRAMUSCULAR | Status: DC | PRN
  Administered 2013-02-13: 10 mg via INTRAVENOUS

## 2013-02-13 MED ORDER — FENTANYL CITRATE 0.05 MG/ML IJ SOLN
INTRAMUSCULAR | Status: DC | PRN
  Administered 2013-02-13 (×3): 50 ug via INTRAVENOUS
  Administered 2013-02-13: 100 ug via INTRAVENOUS
  Administered 2013-02-13 (×3): 50 ug via INTRAVENOUS

## 2013-02-13 MED ORDER — LABETALOL HCL 5 MG/ML IV SOLN
INTRAVENOUS | Status: AC
  Filled 2013-02-13: qty 4

## 2013-02-13 MED ORDER — DEXAMETHASONE SODIUM PHOSPHATE 10 MG/ML IJ SOLN
INTRAMUSCULAR | Status: DC | PRN
  Administered 2013-02-13: 5 mg via INTRAVENOUS

## 2013-02-13 MED ORDER — HYDROCODONE-ACETAMINOPHEN 5-325 MG PO TABS: 1.0000 | ORAL_TABLET | ORAL | Status: DC | PRN

## 2013-02-13 MED ORDER — ROCURONIUM BROMIDE 100 MG/10ML IV SOLN
INTRAVENOUS | Status: DC | PRN
  Administered 2013-02-13: 40 mg via INTRAVENOUS
  Administered 2013-02-13: 5 mg via INTRAVENOUS
  Administered 2013-02-13: 10 mg via INTRAVENOUS

## 2013-02-13 MED ORDER — GLYCOPYRROLATE 0.2 MG/ML IJ SOLN
INTRAMUSCULAR | Status: DC | PRN
  Administered 2013-02-13: .6 mg via INTRAVENOUS

## 2013-02-13 MED ORDER — MIDAZOLAM HCL 5 MG/5ML IJ SOLN
INTRAMUSCULAR | Status: DC | PRN
  Administered 2013-02-13 (×2): 1 mg via INTRAVENOUS

## 2013-02-13 MED ORDER — CEFAZOLIN SODIUM-DEXTROSE 2-3 GM-% IV SOLR
2.0000 g | INTRAVENOUS | Status: AC
  Administered 2013-02-13: 2 g via INTRAVENOUS

## 2013-02-13 MED ORDER — LABETALOL HCL 5 MG/ML IV SOLN
5.0000 mg | INTRAVENOUS | Status: DC | PRN
  Administered 2013-02-13 (×2): 5 mg via INTRAVENOUS

## 2013-02-13 MED ORDER — LIDOCAINE HCL (CARDIAC) 20 MG/ML IV SOLN
INTRAVENOUS | Status: DC | PRN
  Administered 2013-02-13: 60 mg via INTRAVENOUS

## 2013-02-13 MED ORDER — LABETALOL HCL 5 MG/ML IV SOLN
INTRAVENOUS | Status: DC | PRN
  Administered 2013-02-13 (×3): 2.5 mg via INTRAVENOUS

## 2013-02-13 MED ORDER — HYDROMORPHONE HCL PF 1 MG/ML IJ SOLN
0.2500 mg | INTRAMUSCULAR | Status: DC | PRN
  Administered 2013-02-13: 0.5 mg via INTRAVENOUS

## 2013-02-13 MED ORDER — PROPOFOL 10 MG/ML IV BOLUS
INTRAVENOUS | Status: DC | PRN
  Administered 2013-02-13: 180 mg via INTRAVENOUS

## 2013-02-13 MED ORDER — CEFAZOLIN SODIUM-DEXTROSE 2-3 GM-% IV SOLR
INTRAVENOUS | Status: AC
  Filled 2013-02-13: qty 50

## 2013-02-13 MED ORDER — HYDRALAZINE HCL 20 MG/ML IJ SOLN
INTRAMUSCULAR | Status: DC | PRN
  Administered 2013-02-13 (×2): 5 mg via INTRAVENOUS

## 2013-02-13 MED ORDER — NEOSTIGMINE METHYLSULFATE 1 MG/ML IJ SOLN
INTRAMUSCULAR | Status: DC | PRN
  Administered 2013-02-13: 4 mg via INTRAVENOUS

## 2013-02-13 MED ORDER — LACTATED RINGERS IV SOLN: INTRAVENOUS | Status: DC

## 2013-02-13 SURGICAL SUPPLY — 40 items
APL SKNCLS STERI-STRIP NONHPOA (GAUZE/BANDAGES/DRESSINGS) ×1
ATTRACTOMAT 16X20 MAGNETIC DRP (DRAPES) ×2 IMPLANT
BENZOIN TINCTURE PRP APPL 2/3 (GAUZE/BANDAGES/DRESSINGS) ×2 IMPLANT
BLADE HEX COATED 2.75 (ELECTRODE) ×2 IMPLANT
BLADE SURG 15 STRL LF DISP TIS (BLADE) ×1 IMPLANT
BLADE SURG 15 STRL SS (BLADE) ×2
CANISTER SUCTION 2500CC (MISCELLANEOUS) ×2 IMPLANT
CHLORAPREP W/TINT 10.5 ML (MISCELLANEOUS) ×2 IMPLANT
CLIP TI MEDIUM 6 (CLIP) ×4 IMPLANT
CLIP TI WIDE RED SMALL 6 (CLIP) ×4 IMPLANT
CLOTH BEACON ORANGE TIMEOUT ST (SAFETY) ×2 IMPLANT
DRAPE PED LAPAROTOMY (DRAPES) ×2 IMPLANT
DRESSING SURGICEL FIBRLLR 1X2 (HEMOSTASIS) ×1 IMPLANT
DRSG SURGICEL FIBRILLAR 1X2 (HEMOSTASIS) ×2
ELECT REM PT RETURN 9FT ADLT (ELECTROSURGICAL) ×2
ELECTRODE REM PT RTRN 9FT ADLT (ELECTROSURGICAL) ×1 IMPLANT
GAUZE SPONGE 4X4 16PLY XRAY LF (GAUZE/BANDAGES/DRESSINGS) ×2 IMPLANT
GLOVE SURG ORTHO 8.0 STRL STRW (GLOVE) ×2 IMPLANT
GOWN STRL REIN XL XLG (GOWN DISPOSABLE) ×6 IMPLANT
KIT BASIN OR (CUSTOM PROCEDURE TRAY) ×2 IMPLANT
NDL HYPO 25X1 1.5 SAFETY (NEEDLE) ×1 IMPLANT
NEEDLE HYPO 25X1 1.5 SAFETY (NEEDLE) ×2 IMPLANT
NS IRRIG 1000ML POUR BTL (IV SOLUTION) ×2 IMPLANT
PACK BASIC VI WITH GOWN DISP (CUSTOM PROCEDURE TRAY) ×2 IMPLANT
PENCIL BUTTON HOLSTER BLD 10FT (ELECTRODE) ×2 IMPLANT
SPONGE GAUZE 4X4 12PLY (GAUZE/BANDAGES/DRESSINGS) ×1 IMPLANT
STAPLER VISISTAT 35W (STAPLE) ×2 IMPLANT
STRIP CLOSURE SKIN 1/2X4 (GAUZE/BANDAGES/DRESSINGS) ×2 IMPLANT
SUT MNCRL AB 4-0 PS2 18 (SUTURE) ×2 IMPLANT
SUT SILK 2 0 (SUTURE)
SUT SILK 2-0 18XBRD TIE 12 (SUTURE) IMPLANT
SUT SILK 3 0 (SUTURE) ×2
SUT SILK 3-0 18XBRD TIE 12 (SUTURE) IMPLANT
SUT VIC AB 3-0 SH 18 (SUTURE) ×3 IMPLANT
SYR BULB IRRIGATION 50ML (SYRINGE) ×2 IMPLANT
SYR CONTROL 10ML LL (SYRINGE) ×2 IMPLANT
TAPE CLOTH SURG 4X10 WHT LF (GAUZE/BANDAGES/DRESSINGS) ×1 IMPLANT
TOWEL OR 17X26 10 PK STRL BLUE (TOWEL DISPOSABLE) ×2 IMPLANT
TOWEL OR NON WOVEN STRL DISP B (DISPOSABLE) ×2 IMPLANT
YANKAUER SUCT BULB TIP 10FT TU (MISCELLANEOUS) ×2 IMPLANT

## 2013-02-13 NOTE — Op Note (Signed)
OPERATIVE REPORT - PARATHYROIDECTOMY  Preoperative diagnosis: Primary hyperparathyroidism  Postop diagnosis: Same  Procedure: Left superior parathyroidectomy  Surgeon:  Velora Heckler, MD, FACS  Anesthesia: Gen. endotracheal  Estimated blood loss: Minimal  Preparation: ChloraPrep  Indications: patient referred with elevated calcium and parathyroid levels.  Sestamibi suspicious for left adenoma.  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  Procedure: Patient was prepared in the holding area. He was brought to operating room and placed in a supine position on the operating room table. Following administration of general anesthesia, the patient was positioned and then prepped and draped in the usual strict aseptic fashion. After ascertaining that an adequate level of anesthesia been achieved, a neck incision was made with a #15 blade. Dissection was carried through subcutaneous tissues and platysma. Hemostasis was obtained with the electrocautery. Skin flaps were developed circumferentially and a Weitlander retractor was placed for exposure.  Strap muscles were incised in the midline. Strap muscles were reflected exposing the thyroid lobe. With gentle blunt dissection the thyroid lobe was mobilized.  Dissection was carried through adipose tissue and an enlarged parathyroid gland was identified posterior to the left superior thyroid pole. It was gently mobilized. Incision was extended to facilitate exposure as the gland was located very high and posterior adjacent to the hypopharynx.  Vascular structures were divided between small and medium ligaclips. Care was taken to avoid the recurrent laryngeal nerve and the esophagus. The parathyroid gland was completely excised. It was submitted to pathology where frozen section confirmed parathyroid tissue consistent with adenoma.  Neck was irrigated with warm saline and good hemostasis was noted. Fibrillar was placed in the operative field.  Strap muscles were reapproximated in the midline with interrupted 3-0 Vicryl sutures. Platysma was closed with interrupted 3-0 Vicryl sutures. Skin was closed with a running 4-0 Monocryl subcuticular suture. Marcaine was infiltrated circumferentially. Wound was washed and dried and benzoin and Steri-Strips were applied. Sterile gauze dressings were applied. Patient was awakened from anesthesia and brought to the recovery room. The patient tolerated the procedure well.   Velora Heckler, MD, FACS General & Endocrine Surgery [facility]

## 2013-02-13 NOTE — Progress Notes (Signed)
Dr. Leta Jungling made aware of patient's blood pressures in PACU- Orders given.

## 2013-02-13 NOTE — Transfer of Care (Signed)
Immediate Anesthesia Transfer of Care Note  Patient: Gail Fuentes  Procedure(s) Performed: Procedure(s): LEFT INFERIOR PARATHYROIDECTOMY with frozen section  (Left)  Patient Location: PACU  Anesthesia Type:General  Level of Consciousness: awake, alert , oriented and patient cooperative  Airway & Oxygen Therapy: Patient Spontanous Breathing and Patient connected to face mask oxygen  Post-op Assessment: Report given to PACU RN and Patient moving all extremities  Post vital signs: Reviewed and stable  Complications: No apparent anesthesia complications

## 2013-02-13 NOTE — Progress Notes (Signed)
Dr. Leta Jungling made aware of patient's heart rates being in the 70s-80; also made aware of patient's blood pressures- O.K. To go to Short Stay

## 2013-02-13 NOTE — Progress Notes (Signed)
Labetalol 5 mg IVP repeated for elevated blood pressures. 

## 2013-02-13 NOTE — H&P (Signed)
Gail Fuentes is an 67 y.o. female.    General Surgery [facility]  Chief Complaint: primary hyperparathyroidism  HPI:   New Evaluation     eval hyperparathyroidism - referral from Dr. Carlus Pavlov and Dr. Marga Melnick    Patient is a 67 year old female referred by her primary care physician and her endocrinologist for evaluation of suspected primary hyperparathyroidism. Patient has had multiple somatic complaints. Laboratory studies showed an elevated serum calcium level over the past several months ranging from 10.2-10.9. Patient has had intact PTH levels ranging from 130-238. A 24-hour urine collection for calcium was elevated at 300.  Patient denies nephrolithiasis. She does have documented osteopenia on bone density scanning. Patient complains of chronic fatigue. She does complain of bone and joint pain.  Patient has had no prior head or neck surgery. 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 There is no family history of endocrine disease and specifically no history of endocrine neoplasm.  Patient had a thyroid ultrasound performed recently which shows a small, heterogeneous thyroid gland with a solitary right inferior pole nodule measuring 7 mm in size.   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  Past Medical History  Diagnosis Date  . Hyperlipidemia   . Vitamin D deficiency   . Benign paroxysmal positional vertigo   . Other abnormal glucose   . Depressive disorder, not elsewhere classified   . Unspecified hypothyroidism   . Esophageal reflux   . Personal history of other diseases of digestive system     12-15 since 1990, no recurrence 2006  . Allergy     sneezing  late Fall  . Anemia 01/23/12    H/H 11.7/35  . Abnormal EKG     per patient- states evaluated by Dr Daleen Squibb in the past  . Anxiety state, unspecified   . PONV (postoperative nausea and  vomiting)   . Arthritis     Past Surgical History  Procedure Laterality Date  . Dental surgery      Age 46-Abscessed Teeth Extracted   . Cesarean section    . Colonoscopy  12/2003    Tics  . Total abdominal hysterectomy w/ bilateral salpingoophorectomy      Ovarian Cyst  . Cholecystectomy      Family History  Problem Relation Age of Onset  . Coronary artery disease Father   . Heart attack Father 88  . COPD Father   . Diabetes Mother   . Dysrhythmia Mother   . Depression Mother   . COPD Mother   . Prostate cancer Brother   . Other Brother     Wegener's in sinus; [facility]  . Hypertension Brother   . Cancer Brother     prostate  . Cancer Brother     Soft tissue in the face/temple   Social History:  reports that she quit smoking about 39 years ago. She has never used smokeless tobacco. She reports that she drinks about 7.0 ounces of alcohol per week. She reports that she does not use illicit drugs.  Allergies: No Known Allergies  Medications Prior to Admission  Medication Sig Dispense Refill  . ALPRAZolam (XANAX) 0.5 MG tablet Take 0.5 mg by mouth 3 (three) times daily as needed for anxiety.       [name] estrogens, conjugated, (PREMARIN) 0.45 MG tablet Take 0.45 mg by mouth daily.       [name]  fexofenadine (ALLEGRA) 180 MG tablet Take 180 mg by mouth daily.      [name] FLUoxetine (PROZAC) 40 MG capsule Take 40 mg by mouth every morning.       . fluticasone (FLONASE) 50 MCG/ACT nasal spray Place 1 spray into the nose 2 (two) times daily as needed for rhinitis.  16 g  2  . levothyroxine (SYNTHROID, LEVOTHROID) 125 MCG tablet Take 125 mcg by mouth daily before breakfast.      . omeprazole (PRILOSEC) 20 MG capsule Take 1 capsule (20 mg total) by mouth daily.  90 capsule  3  . Probiotic Product (ALIGN) 4 MG CAPS Take 4 mg by mouth daily.        No results found for this or any previous visit (from the past 48 hour(s)). No results found.  Review of Systems  Constitutional: Positive for  malaise/fatigue.  All other systems reviewed and are negative.    Blood pressure 171/67, pulse 60, temperature 97.7 F (36.5 C), temperature source Oral, resp. rate 18, SpO2 99.00%. Physical Exam  Constitutional: She is oriented to person, place, and time. She appears well-developed and well-nourished. No distress.  HENT:  Head: Normocephalic and atraumatic.  Right Ear: External ear normal.  Left Ear: External ear normal.  Eyes: Conjunctivae are normal. Pupils are equal, round, and reactive to light. No scleral icterus.  Neck: Normal range of motion. Neck supple. No thyromegaly present.  Cardiovascular: Normal rate, regular rhythm and normal heart sounds.   Respiratory: Effort normal and breath sounds normal. She has no wheezes.  GI: Soft. Bowel sounds are normal. She exhibits no distension.  Musculoskeletal: Normal range of motion. She exhibits no edema.  Lymphadenopathy:    She has no cervical adenopathy.  Neurological: She is alert and oriented to person, place, and time.  Skin: Skin is warm and dry.  Psychiatric: She has a normal mood and affect. Her behavior is normal.     Assessment/Plan Primary hyperparathyroidism  Plan left inferior parathyroidectomy  The risks and benefits of the procedure have been discussed at length with the patient.  The patient understands the proposed procedure, potential alternative treatments, and the course of recovery to be expected.  All of the patient's questions have been answered at this time.  The patient wishes to proceed with surgery.  Velora Heckler, MD, FACS General & Endocrine Surgery [facility] Office: [phone]   Christe Tellez Judie Petit 02/13/2013, 7:11 AM

## 2013-02-13 NOTE — Anesthesia Preprocedure Evaluation (Addendum)
Anesthesia Evaluation  Patient identified by MRN, date of birth, ID band Patient awake    Reviewed: Allergy & Precautions, H&P , NPO status , Patient's Chart, lab work & pertinent test results  History of Anesthesia Complications (+) PONV  Airway Mallampati: II TM Distance: >3 FB Neck ROM: full    Dental  (+) Caps and Dental Advisory Given All of upper front teeth are capped ( bridge ):   Pulmonary neg pulmonary ROS,  breath sounds clear to auscultation  Pulmonary exam normal       Cardiovascular Exercise Tolerance: Good negative cardio ROS  Rhythm:regular Rate:Normal     Neuro/Psych vertigo negative neurological ROS  negative psych ROS   GI/Hepatic negative GI ROS, Neg liver ROS, GERD-  Medicated and Controlled,  Endo/Other  negative endocrine ROSHypothyroidism Primary hyperparathyroidism  Renal/GU negative Renal ROS  negative genitourinary   Musculoskeletal   Abdominal   Peds  Hematology negative hematology ROS (+)   Anesthesia Other Findings   Reproductive/Obstetrics negative OB ROS                          Anesthesia Physical Anesthesia Plan  ASA: II  Anesthesia Plan: General   Post-op Pain Management:    Induction: Intravenous  Airway Management Planned: Oral ETT  Additional Equipment:   Intra-op Plan:   Post-operative Plan: Extubation in OR  Informed Consent: I have reviewed the patients History and Physical, chart, labs and discussed the procedure including the risks, benefits and alternatives for the proposed anesthesia with the patient or authorized representative who has indicated his/her understanding and acceptance.   Dental Advisory Given  Plan Discussed with: CRNA and Surgeon  Anesthesia Plan Comments:         Anesthesia Quick Evaluation

## 2013-02-13 NOTE — Progress Notes (Signed)
Blood pressure now 152/72-

## 2013-02-13 NOTE — Progress Notes (Signed)
Labetalol begun for elevated blood pressures.

## 2013-02-13 NOTE — Anesthesia Postprocedure Evaluation (Signed)
  Anesthesia Post-op Note  Patient: Gail Fuentes  Procedure(s) Performed: Procedure(s) (LRB): LEFT INFERIOR PARATHYROIDECTOMY with frozen section  (Left)  Patient Location: PACU  Anesthesia Type: General  Level of Consciousness: awake and alert   Airway and Oxygen Therapy: Patient Spontanous Breathing  Post-op Pain: mild  Post-op Assessment: Post-op Vital signs reviewed, Patient's Cardiovascular Status Stable, Respiratory Function Stable, Patent Airway and No signs of Nausea or vomiting  Last Vitals:  Filed Vitals:   02/13/13 1003  BP: 183/83  Pulse: 85  Temp:   Resp: 12    Post-op Vital Signs: stable   Complications: No apparent anesthesia complications

## 2013-02-16 ENCOUNTER — Other Ambulatory Visit (INDEPENDENT_AMBULATORY_CARE_PROVIDER_SITE_OTHER): Payer: Self-pay

## 2013-02-16 ENCOUNTER — Telehealth (INDEPENDENT_AMBULATORY_CARE_PROVIDER_SITE_OTHER): Payer: Self-pay | Admitting: *Deleted

## 2013-02-16 ENCOUNTER — Encounter (HOSPITAL_COMMUNITY): Payer: Self-pay | Admitting: Surgery

## 2013-02-16 NOTE — Telephone Encounter (Signed)
Patient called reporting that she is having some feelings of anxiety and shaking.  Patient had parathyroidectomy on 02/13/2013.  Patient denies any fever or signs of infection.  Explained to patient that this could still be from the anesthesia.  Encouraged patient to make sure she stays hydrated.  Explained to patient that if she is still having these symptoms tomorrow to give [name] a call back which will be 4 days PO.  Patient states some pain in the back of her neck but Ibuprofen is helping with this pain.  Explained for patient to continue to look for signs/symptoms of infection and give [name] a call if symptoms change at all.  Patient states understanding and agreeable at this time.

## 2013-02-27 LAB — CALCIUM: Calcium: 9.1 mg/dL (ref 8.4–10.5)

## 2013-03-02 ENCOUNTER — Encounter (INDEPENDENT_AMBULATORY_CARE_PROVIDER_SITE_OTHER): Payer: Self-pay | Admitting: Surgery

## 2013-03-02 ENCOUNTER — Ambulatory Visit (INDEPENDENT_AMBULATORY_CARE_PROVIDER_SITE_OTHER): Payer: Medicare Other | Admitting: Surgery

## 2013-03-02 VITALS — BP 130/88 | HR 64 | Temp 98.3°F | Resp 14 | Ht 65.5 in | Wt 212.0 lb

## 2013-03-02 DIAGNOSIS — E210 Primary hyperparathyroidism: Secondary | ICD-10-CM

## 2013-03-02 NOTE — Patient Instructions (Signed)
  COCOA BUTTER & VITAMIN E CREAM  ([employer] or other brand)  Apply cocoa butter/vitamin E cream to your incision 2 - 3 times daily.  Massage cream into incision for one minute with each application.  Use sunscreen (50 SPF or higher) for first 6 months after surgery if area is exposed to sun.  You may substitute Mederma or other scar reducing creams as desired.   

## 2013-03-02 NOTE — Progress Notes (Signed)
General Surgery [facility]  Chief Complaint  Patient presents with  . Routine Post Op    parathyroidectomy 02/13/2013    HISTORY: Patient is a 67 year old female who underwent parathyroidectomy on 02/13/2013. Postoperative course has been largely uneventful. Followup calcium level on 02/27/2013 is now normal at 9.1.  EXAM: Surgical incision is healing without complication. Mild soft tissue swelling. No sign of infection. No sign of seroma. Voice quality is normal.  IMPRESSION: Status post minimally invasive parathyroidectomy  PLAN: Patient will begin applying topical creams to her incision. She will return in 6 weeks for wound check. We will check a serum calcium level and intact PTH level prior to that office visit.  Velora Heckler, MD, FACS General & Endocrine Surgery [facility]   Visit Diagnoses: 1. Hyperparathyroidism, primary

## 2013-03-06 ENCOUNTER — Ambulatory Visit (INDEPENDENT_AMBULATORY_CARE_PROVIDER_SITE_OTHER): Payer: Medicare Other | Admitting: *Deleted

## 2013-03-06 DIAGNOSIS — Z23 Encounter for immunization: Secondary | ICD-10-CM

## 2013-03-12 ENCOUNTER — Other Ambulatory Visit: Payer: Self-pay

## 2013-03-16 ENCOUNTER — Encounter: Payer: Self-pay | Admitting: Internal Medicine

## 2013-03-16 ENCOUNTER — Ambulatory Visit (INDEPENDENT_AMBULATORY_CARE_PROVIDER_SITE_OTHER): Payer: Medicare Other | Admitting: Internal Medicine

## 2013-03-16 VITALS — BP 114/60 | HR 67 | Temp 97.9°F | Resp 12 | Wt 212.7 lb

## 2013-03-16 DIAGNOSIS — E210 Primary hyperparathyroidism: Secondary | ICD-10-CM

## 2013-03-16 LAB — CALCIUM: Calcium: 9.1 mg/dL (ref 8.4–10.5)

## 2013-03-16 NOTE — Patient Instructions (Signed)
Please return in a year.  Please stop at the lab.

## 2013-03-16 NOTE — Progress Notes (Signed)
Subjective:     Patient ID: Gail Fuentes, female   DOB: 07/31/1945, 67 y.o.   MRN: [id number]  HPI Gail Fuentes is a pleasant 67 y/o returning for f/u for dx hyperparathyroidism, s/p parathyroidectomy.  Since I last saw her, Gail Fuentes had left superior parathyroidectomy with Dr Gerrit Friends on 02/13/2013. She had HTN after the surgery, now resolved. She feels better after the surgery. She can think clearer and has more energy. She tells me she had some mouth corner twitching during the night mostly and had leg cramps right after the surgery, not anymore.  Reviewed hx:  Reviewed previous PTH and Ca levels: Lab Results  Component Value Date   PTH 238.7* 10/20/2012   PTH 132.6* 10/02/2012   CALCIUM 9.1 02/27/2013   CALCIUM 10.7* 02/10/2013   CALCIUM 10.2 10/20/2012   CALCIUM 10.5 10/02/2012   CALCIUM 10.9* 10/01/2012   CALCIUM 10.7* 12/27/2011   CALCIUM 10.5 09/21/2010   CALCIUM 10.2 12/27/2008   CALCIUM 10.4 07/23/2007   CALCIUM 10.2 05/13/2006   Pt had a thyroid ultrasound (10/08/2012) showing only a right thyroid nodule, 7 x 7 x 7 mm, with internal calcifications. A technetium sestamibi scan showed a left inferior parathyroid adenoma and a CT scan confirmed this. 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  She does not have a history of kidney stones, had an osteoporotic T score only at UD radius per DEXA this year, no fractures, has had constipation for a long time, but alternating with diarrhea, no abdominal pain, had generalized pain (also ESR found high much improved with prednisone), no hypertension, has vertigo. She has anxiety and depression for a long time. She tells me she feels now much better after the surgery..   She has a h/o vit D def >> was on Ergo 50,000 IU once a week for 1-2 years. She also took 1000 units of vit D3 daily, too. She is now off the calcium and vitamin D. She got nausea with calcium supplements in the  past, before the surgery.   Review of Systems Constitutional: + weight gain after vacation, + fatigue, no subjective hyperthermia/hypothermia Eyes: + blurry vision, no xerophthalmia ENT: no sore throat, no  nodules palpated in throat, + occasional dysphagia/no odynophagia, + hoarseness  - sinus drainage Cardiovascular: no CP/SOB/palpitations/+leg swelling Respiratory: no cough/SOB/wheezing Gastrointestinal: no N/V/D/C/+ GERD Musculoskeletal: no muscle/+ joint aches Skin: no rashes, + hair loss, + Itching Neurological: + tremors - around mouth! /numbness/tingling/dizziness Psychiatric: no depression/+ anxiety - improved  Objective:   Physical Exam BP 114/60  Pulse 67  Temp(Src) 97.9 F (36.6 C) (Oral)  Resp 12  Wt 212 lb 11.2 oz (96.48 kg)  SpO2 96% Wt Readings from Last 3 Encounters:  03/16/13 212 lb 11.2 oz (96.48 kg)  03/02/13 212 lb (96.163 kg)  02/10/13 212 lb (96.163 kg)   Constitutional: overweight, in NAD Eyes: PERRLA, EOMI, no exophthalmos ENT: moist mucous membranes, no thyromegaly, cervical scar is still erythematous, swollen; no cervical lymphadenopathy; Chvostek sign negative bilaterally Cardiovascular: RRR, No MRG Respiratory: CTA B Gastrointestinal: abdomen soft, NT, ND, BS+ Musculoskeletal: no deformities, strength intact in all 4 Skin: moist, warm, no rashes Neurological: very mild tremor with outstretched hands, DTR normal in all 4  Assessment:     1. Primary hyperparathyroidism - status post left superior parathyroidectomy 02/13/2013, by Dr. Gerrit Friends - calcium < 1 mg/dl above normal before the surgery - elevated PTH (133, 238) - normal  vit D  - elevated 1,25 vitamin D - no renal failure - osteopenia  - no h/o kidney stones - DEXA 01/23/2010 ([facility]):  L1-4 T score -2.0  Left femoral neck T score -1.9  Right femoral neck T score -1.4  10 year any fracture risk 13.8%, 10 year hip fracture risk 2.9% - CT neck 01/14/2013:    Focal nodular  appearance along the posterior aspect of the left lobe of thyroid corresponds with a sestamibi exam and suggests a left lower parathyroid adenoma.   No significant nodular tissue adjacent to the submandibular glands.   Sub centimeter lymph nodes are likely within normal limits.  Focal calcification is noted within the right lobe of the thyroid.  - Tc sestamibi scan 12/29/2012: suspicious for left thyroid lobe region parathyroid  adenoma. Questionable abnormal focus also in the right submandibular space - DEXA 11/14/2012 ([facility]):  L1-4 T score -2.0 >> -2.1 (but high SD, L1 T score is -2.8)  Left femoral neck T score -1.9 >> -1.9  Right femoral neck T score -1.4 >> -1.7  33% distal radius: -1.5  But worrisome that UD radius -3.1 (good evaluation for trabecular bone) - UCa 300 mg/24h (11/06/2012) - thyroid ultrasound 10/08/2012: Thyroid echotexture is heterogeneous.   Right lobe: Measures 3 x 1.2 x 1.1 cm.   Left lobe: Measures 2.9 x 1.9 x 0.9 cm.   Isthmus: Measures 0.4 cm.   Focal lesions: Within the lower pole of the right lobe of thyroid gland there is a hypoechoic nodule containing coarsened calcifications. This measures 7 x 7 x 7 mm.   No lymphadenopathy identified.   Plan:     1. Hyperparathyroidism: Pt with recent h/o hyperparathyroidism, with minimally elevated calcium levels but with very high PTH levels. - the patient had a very high PTH level, along with high urinary calcium and low Ultradistal radius T-score - localization studies were positive for left parathyroid adenoma and she had a parathyroidectomy on 02/13/2013. - She feels very well after the surgery, and this was only complicated by high blood pressure right after waking up from anesthesia - she complains of twitching in one of her mouth corners, therefore, I would check a calcium level today. She is not taking any supplement at the moment. We discussed about signs of hypocalcemia. - she will have another  appointment in a month with Dr. Gerrit Friends and will have another set of labs then - she appears to be doing well, and I would like to see her back in another year.  - We'll need to obtain another thyroid ultrasound in one or 2 years after the previous investigate the subcentimeter right thyroid nodule.   Office Visit on 03/16/2013  Component Date Value Range Status  . Calcium 03/16/2013 9.1  8.4 - 10.5 mg/dL Final   Normal Ca - msg sent.

## 2013-03-17 ENCOUNTER — Ambulatory Visit (INDEPENDENT_AMBULATORY_CARE_PROVIDER_SITE_OTHER): Payer: Medicare Other

## 2013-03-17 DIAGNOSIS — Z23 Encounter for immunization: Secondary | ICD-10-CM

## 2013-04-01 ENCOUNTER — Ambulatory Visit (INDEPENDENT_AMBULATORY_CARE_PROVIDER_SITE_OTHER): Payer: Medicare Other | Admitting: Internal Medicine

## 2013-04-01 ENCOUNTER — Encounter: Payer: Self-pay | Admitting: Internal Medicine

## 2013-04-01 VITALS — BP 159/77 | HR 69 | Temp 99.5°F | Ht 64.75 in | Wt 215.2 lb

## 2013-04-01 DIAGNOSIS — J209 Acute bronchitis, unspecified: Secondary | ICD-10-CM

## 2013-04-01 MED ORDER — AZITHROMYCIN 250 MG PO TABS: ORAL_TABLET | ORAL | Status: DC

## 2013-04-01 NOTE — Progress Notes (Signed)
Pre visit review using our clinic review tool, if applicable. No additional management support is needed unless otherwise documented below in the visit note. 

## 2013-04-01 NOTE — Patient Instructions (Signed)
Plain Mucinex (NOT D) for thick secretions ;force NON dairy fluids .   Nasal cleansing in the shower as discussed with lather of mild shampoo.After 10 seconds wash off lather while  exhaling through nostrils. Make sure that all residual soap is removed to prevent irritation.  Fluticasone 1 spray in each nostril twice a day as needed. Use the "crossover" technique into opposite nostril spraying toward opposite ear @ 45 degree angle, not straight up into nostril.  Use a Neti pot daily only  as needed for significant sinus congestion; going from open side to congested side . Plain Allegra (NOT D )  160 daily , Loratidine 10 mg , OR Zyrtec 10 mg @ bedtime  as needed for itchy eyes & sneezing. NSAIDS ( Aleve, Advil, Naproxen) or Tylenol every 4 hrs as needed for fever as discussed based on label recommendations. Carry room temperature water and sip liberally after coughing.

## 2013-04-01 NOTE — Progress Notes (Signed)
   Subjective:    Patient ID: Gail Fuentes, female    DOB: 16-Dec-1945, 67 y.o.   MRN: [id number]  HPI   Symptoms began 3-4 days ago as intermittent dry cough & scratchy throat. She's noted some postnasal drainage but has not visualized secretions. She's had some sneezing as well as well as shortness of breath.  Minor symptoms include watery eyes. She's also had discomfort and actually sinus areas.  Mucinex helped.  She has not smoked for 40 years. She took the flu shot. No BP check @ home    Review of Systems  She is not having significant frontal headache, nasal purulence, dental pain, or otic discharge. She describes pressure in ears.  The cough has not been associated with wheezing. There's also no significant itchy, watery eyes.         Objective:   Physical Exam General appearance:good health ;well nourished; no acute distress or increased work of breathing is present.  No  lymphadenopathy about the head, neck, or axilla noted.   Eyes: No conjunctival inflammation or lid edema is present.   Ears:  External ear exam shows no significant lesions or deformities.  Otoscopic examination reveals clear canals, tympanic membranes are intact bilaterally without bulging, retraction, inflammation or discharge.  Nose:  External nasal examination shows no deformity or inflammation. Nasal mucosa are dry without lesions or exudates. No septal dislocation or deviation.No obstruction to airflow.   Oral exam: Dental hygiene is good;upper partial.Lips and gums are healthy appearing.There is no oropharyngeal erythema or exudate noted.   Neck:  No deformities, masses, or tenderness noted.      Heart:  Normal rate and regular rhythm. S1 and S2 normal without gallop, murmur, click, rub or other extra sounds.   Lungs:Chest clear to auscultation; no wheezes, rhonchi,rales ,or rubs present.No increased work of breathing.    Extremities:  No cyanosis, edema, or clubbing  noted    Skin:  Warm & dry.         Assessment & Plan:  #1 acute bronchitis w/o bronchospasm #2 URI, acute Plan: See orders and recommendations

## 2013-04-21 LAB — PTH, INTACT AND CALCIUM: PTH: 183.4 pg/mL — ABNORMAL HIGH (ref 14.0–72.0)

## 2013-04-22 ENCOUNTER — Ambulatory Visit (INDEPENDENT_AMBULATORY_CARE_PROVIDER_SITE_OTHER): Payer: Medicare Other | Admitting: Surgery

## 2013-04-22 ENCOUNTER — Encounter (INDEPENDENT_AMBULATORY_CARE_PROVIDER_SITE_OTHER): Payer: Self-pay | Admitting: Surgery

## 2013-04-22 VITALS — BP 120/70 | HR 60 | Temp 98.4°F | Resp 14 | Ht 65.0 in | Wt 213.0 lb

## 2013-04-22 DIAGNOSIS — E210 Primary hyperparathyroidism: Secondary | ICD-10-CM

## 2013-04-22 NOTE — Progress Notes (Signed)
General Surgery [facility]  Chief Complaint  Patient presents with  . Routine Post Op    parathyroidectomy 02/13/2013    HISTORY: Patient is a 67 year old female who underwent parathyroidectomy in October 2014. Following surgery her calcium levels have been perfect at 9.1 and 9.2. She feels better with more energy.  Repeat laboratories this week however showing normal calcium of 9.2 and a persistently elevated intact PTH level of 183.4.  The patient also reports intermittent mild hoarseness which she attributes possibly to her reflux.  EXAM: Surgical incision is healing nicely with good cosmetic result. Minimal soft tissue swelling. No sign of infection. No sign of seroma. Voice quality is normal at conversational level.  IMPRESSION: Status post neck exploration and parathyroidectomy  PLAN: The patient and I reviewed her laboratory studies. I do not have a good explanation for the elevated PTH level at this time, given the absolutely normal calcium level. Sometimes the PTH level is slow to return to normal. Other times I have seen this related to a vitamin D deficiency. Therefore I will ask her to return in April 2015. We will check an intact PTH level, a calcium level, and a 25 hydroxy vitamin D level prior to that office visit. We will also reassess her voice quality at that office visit. If she has persistent concerns over her voice quality, we will make referral to the voice disorders clinic at [facility]  Velora Heckler, MD, FACS General & Endocrine Surgery [facility]   Visit Diagnoses: 1. Hyperparathyroidism, primary

## 2013-04-22 NOTE — Patient Instructions (Signed)
  COCOA BUTTER & VITAMIN E CREAM  ([employer] or other brand)  Apply cocoa butter/vitamin E cream to your incision 2 - 3 times daily.  Massage cream into incision for one minute with each application.  Use sunscreen (50 SPF or higher) for first 6 months after surgery if area is exposed to sun.  You may substitute Mederma or other scar reducing creams as desired.   

## 2013-06-29 ENCOUNTER — Ambulatory Visit (INDEPENDENT_AMBULATORY_CARE_PROVIDER_SITE_OTHER): Payer: Medicare Other | Admitting: Internal Medicine

## 2013-06-29 ENCOUNTER — Encounter: Payer: Self-pay | Admitting: Internal Medicine

## 2013-06-29 VITALS — BP 170/90 | HR 62 | Temp 96.8°F | Resp 12 | Wt 213.1 lb

## 2013-06-29 DIAGNOSIS — R030 Elevated blood-pressure reading, without diagnosis of hypertension: Secondary | ICD-10-CM

## 2013-06-29 MED ORDER — HYDROCHLOROTHIAZIDE 12.5 MG PO CAPS: 12.5000 mg | ORAL_CAPSULE | Freq: Every day | ORAL | Status: DC

## 2013-06-29 NOTE — Patient Instructions (Signed)
Minimal Blood Pressure Goal= AVERAGE < 140/90;  Ideal is an AVERAGE < 135/85. This AVERAGE should be calculated from @ least 5-7 BP readings taken @ different times of day on different days of week. You should not respond to isolated BP readings , but rather the AVERAGE for that week .Please bring your  blood pressure cuff to office visits to verify that it is reliable.It  can also be checked against the blood pressure device at the pharmacy. Finger or wrist cuffs are not dependable; an arm cuff is.  Cardiovascular exercise, this can be as simple a program as walking, is recommended 30-45 minutes 3-4 times per week. If you're not exercising you should take 6-8 weeks to build up to this level.   

## 2013-06-29 NOTE — Progress Notes (Signed)
HYPERTENSION Disease Monitoring Blood pressure range-systolic 140-170s, diastolic 80-90s Chest pain- no    Dyspnea- no. No epistaxis. Reports generalized anxiety most of the time despite taking fluoxetine. Not prescribed HTN med at this time. Lightheadedness- occasionally. Reports edema in left foot. Smoked 2 packs/day for 4 years in twenties.

## 2013-06-29 NOTE — Progress Notes (Signed)
   Subjective:    Patient ID: Gail Fuentes, female    DOB: [birth year], 68 y.o.   MRN: [id number]  HPI Blood pressure range  : systolic 140-170s, diastolic 80-90s.Not currently prescribed anti hypertemsive medication. No lightheadedness, epistaxis, or SOB. A heart healthy /low salt diet is not  followed. Not presently exercising. Family history in maternal family  for HTN /CVA        Review of Systems Significant headaches as posterior neck knot with pain for days.Epistaxis, chest pain, palpitations, exertional dyspnea, claudication, paroxysmal nocturnal dyspnea, or edema absent. 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  She is also getting involved in the affairs of her since acting "like a mother".  She previously saw a psychologist but this has been pursued for > 2 years.       Objective:   Physical Exam  Appears  well-nourished & in no acute distress  Minimal arteriolar narrowing  No carotid bruits are present.No neck pain distention present at 10 - 15 degrees. Thyroid normal to palpation  Heart rhythm and rate are normal with Grade 1/2 systolic murmur Chest is clear with no increased work of breathing  BP 190/102 with her cuff;170/92 with ours.  There is no evidence of aortic aneurysm or renal artery bruits  Abdomen soft with no organomegaly or masses. No HJR  No clubbing, cyanosis  Present. Trace edema  Pedal pulses are intact   No ischemic skin changes are present . Nails healthy Alert and oriented. Strength, tone, DTRs reflexes normal          Assessment & Plan:  #1 elevated BP in context of #2  #2 exogenous stress See orders

## 2013-06-29 NOTE — Progress Notes (Signed)
Pre visit review using our clinic review tool, if applicable. No additional management support is needed unless otherwise documented below in the visit note. 

## 2013-07-02 ENCOUNTER — Other Ambulatory Visit: Payer: Self-pay | Admitting: Internal Medicine

## 2013-07-03 ENCOUNTER — Ambulatory Visit (INDEPENDENT_AMBULATORY_CARE_PROVIDER_SITE_OTHER): Payer: 59 | Admitting: Licensed Clinical Social Worker

## 2013-07-03 DIAGNOSIS — F331 Major depressive disorder, recurrent, moderate: Secondary | ICD-10-CM

## 2013-07-07 ENCOUNTER — Ambulatory Visit (INDEPENDENT_AMBULATORY_CARE_PROVIDER_SITE_OTHER): Payer: 59 | Admitting: Licensed Clinical Social Worker

## 2013-07-07 DIAGNOSIS — F331 Major depressive disorder, recurrent, moderate: Secondary | ICD-10-CM

## 2013-07-10 ENCOUNTER — Telehealth: Payer: Self-pay | Admitting: *Deleted

## 2013-07-10 NOTE — Telephone Encounter (Signed)
Patient phoned stating that recently prescribed HCTZ is causing her to be "dizzy" for the past 2.5 days.  Should she continue medication?  Please advise.  CB# [phone]

## 2013-07-10 NOTE — Telephone Encounter (Signed)
Phoned patient and relayed MD's instructions and for her to f/u first of the week.

## 2013-07-10 NOTE — Telephone Encounter (Signed)
   Hold this Medication and monitor blood pressure; be seen if blood pressure average is greater than 140/90

## 2013-07-13 ENCOUNTER — Ambulatory Visit (INDEPENDENT_AMBULATORY_CARE_PROVIDER_SITE_OTHER): Payer: Medicare Other | Admitting: Internal Medicine

## 2013-07-13 ENCOUNTER — Encounter: Payer: Self-pay | Admitting: Internal Medicine

## 2013-07-13 VITALS — BP 122/80 | HR 61 | Temp 97.5°F | Wt 207.2 lb

## 2013-07-13 DIAGNOSIS — I951 Orthostatic hypotension: Secondary | ICD-10-CM

## 2013-07-13 DIAGNOSIS — I10 Essential (primary) hypertension: Secondary | ICD-10-CM

## 2013-07-13 DIAGNOSIS — H8110 Benign paroxysmal vertigo, unspecified ear: Secondary | ICD-10-CM

## 2013-07-13 MED ORDER — LOSARTAN POTASSIUM 50 MG PO TABS: 50.0000 mg | ORAL_TABLET | Freq: Every day | ORAL | Status: DC

## 2013-07-13 NOTE — Patient Instructions (Signed)
Repeat the isometric exercises discussed 4- 5 times prior to standing if you've been seated for a period of time. Minimal Blood Pressure Goal= AVERAGE < 140/90;  Ideal is an AVERAGE < 135/85. This AVERAGE should be calculated from @ least 5-7 BP readings taken @ different times of day on different days of week. You should not respond to isolated BP readings , but rather the AVERAGE for that week .Please bring your  blood pressure cuff to office visits to verify that it is reliable.It  can also be checked against the blood pressure device at the pharmacy.

## 2013-07-13 NOTE — Progress Notes (Signed)
Pre visit review using our clinic review tool, if applicable. No additional management support is needed unless otherwise documented below in the visit note. 

## 2013-07-14 ENCOUNTER — Ambulatory Visit (INDEPENDENT_AMBULATORY_CARE_PROVIDER_SITE_OTHER): Payer: 59 | Admitting: Licensed Clinical Social Worker

## 2013-07-14 DIAGNOSIS — F331 Major depressive disorder, recurrent, moderate: Secondary | ICD-10-CM
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
## 2013-07-30 ENCOUNTER — Ambulatory Visit (INDEPENDENT_AMBULATORY_CARE_PROVIDER_SITE_OTHER): Payer: 59 | Admitting: Licensed Clinical Social Worker

## 2013-07-30 DIAGNOSIS — F331 Major depressive disorder, recurrent, moderate: Secondary | ICD-10-CM

## 2013-08-11 ENCOUNTER — Encounter (INDEPENDENT_AMBULATORY_CARE_PROVIDER_SITE_OTHER): Payer: Self-pay | Admitting: Surgery

## 2013-08-20 ENCOUNTER — Ambulatory Visit (INDEPENDENT_AMBULATORY_CARE_PROVIDER_SITE_OTHER): Payer: 59 | Admitting: Licensed Clinical Social Worker

## 2013-08-20 DIAGNOSIS — F331 Major depressive disorder, recurrent, moderate: Secondary | ICD-10-CM

## 2013-09-04 LAB — PTH, INTACT AND CALCIUM
Calcium: 9.3 mg/dL (ref 8.4–10.5)
PTH: 72.1 pg/mL — ABNORMAL HIGH (ref 14.0–72.0)

## 2013-09-04 LAB — VITAMIN D 25 HYDROXY (VIT D DEFICIENCY, FRACTURES): Vit D, 25-Hydroxy: 35 ng/mL (ref 30–89)

## 2013-09-05 NOTE — Progress Notes (Signed)
Quick Note:  Levels are now all normal! Will assess voice quality in office at next visit.  Velora Heckler, MD, [facility] Office: [phone]    ______

## 2013-09-07 ENCOUNTER — Other Ambulatory Visit: Payer: Self-pay

## 2013-09-07 ENCOUNTER — Telehealth (INDEPENDENT_AMBULATORY_CARE_PROVIDER_SITE_OTHER): Payer: Self-pay

## 2013-09-07 DIAGNOSIS — R030 Elevated blood-pressure reading, without diagnosis of hypertension: Secondary | ICD-10-CM

## 2013-09-07 DIAGNOSIS — I10 Essential (primary) hypertension: Secondary | ICD-10-CM

## 2013-09-07 MED ORDER — HYDROCHLOROTHIAZIDE 12.5 MG PO CAPS: 12.5000 mg | ORAL_CAPSULE | Freq: Every day | ORAL | Status: DC

## 2013-09-07 MED ORDER — LOSARTAN POTASSIUM 50 MG PO TABS: 50.0000 mg | ORAL_TABLET | Freq: Every day | ORAL | Status: DC

## 2013-09-07 NOTE — Telephone Encounter (Signed)
Message copied by Joanette Gula on Mon Sep 07, 2013  9:05 AM ------      Message from: Velora Heckler      Created: Sat Sep 05, 2013  1:51 PM       Levels are now all normal!  Will assess voice quality in office at next visit.            Velora Heckler, MD, [facility]      Office: [phone]                   ------

## 2013-09-07 NOTE — Telephone Encounter (Signed)
Pt notified of lab results per Dr Gerkin's request.. 

## 2013-09-08 ENCOUNTER — Ambulatory Visit (INDEPENDENT_AMBULATORY_CARE_PROVIDER_SITE_OTHER): Payer: 59 | Admitting: Licensed Clinical Social Worker

## 2013-09-08 DIAGNOSIS — F331 Major depressive disorder, recurrent, moderate: Secondary | ICD-10-CM

## 2013-09-25 ENCOUNTER — Ambulatory Visit (INDEPENDENT_AMBULATORY_CARE_PROVIDER_SITE_OTHER): Payer: 59 | Admitting: Licensed Clinical Social Worker

## 2013-09-25 DIAGNOSIS — F331 Major depressive disorder, recurrent, moderate: Secondary | ICD-10-CM

## 2013-09-30 ENCOUNTER — Encounter (INDEPENDENT_AMBULATORY_CARE_PROVIDER_SITE_OTHER): Payer: Self-pay | Admitting: Surgery

## 2013-09-30 ENCOUNTER — Ambulatory Visit (INDEPENDENT_AMBULATORY_CARE_PROVIDER_SITE_OTHER): Payer: Medicare Other | Admitting: Surgery

## 2013-09-30 VITALS — BP 128/70 | HR 74 | Temp 98.1°F | Resp 18 | Ht 65.0 in | Wt 210.0 lb

## 2013-09-30 DIAGNOSIS — E210 Primary hyperparathyroidism: Secondary | ICD-10-CM

## 2013-09-30 NOTE — Progress Notes (Signed)
General Surgery [facility]  Chief Complaint  Patient presents with  . Follow-up    parathyroidectomy - 02/2013    HISTORY: The patient is a 68 year old female who underwent parathyroidectomy in October 2014. Postoperatively her laboratory studies were slow to normalize. Her most recent laboratory studies in April 2015 however are now normal. Calcium level remains normal at 9.3. Intact PTH level is normal at 72.1. 25 hydroxy vitamin D level is normal at 35.  Patient had mild hoarseness following her procedure. That has now largely resolved. We had discussed referral to [facility] but will continue to monitor her for the time being.  PERTINENT REVIEW OF SYSTEMS: Patient's fatigue has resolved. Bone and joint pain has improved. Denies dysphagia.  EXAM: HEENT: normocephalic; pupils equal and reactive; sclerae clear; dentition good; mucous membranes moist NECK:  Well-healed surgical incision with good cosmetic result; no palpable masses in the thyroid bed; symmetric on extension; no palpable anterior or posterior cervical lymphadenopathy; no supraclavicular masses; no tenderness CHEST: clear to auscultation bilaterally without rales, rhonchi, or wheezes CARDIAC: regular rate and rhythm without significant murmur; peripheral pulses are full EXT:  non-tender without edema; no deformity NEURO: no gross focal deficits; no sign of tremor   IMPRESSION: History of primary hyperparathyroidism, now resolved  PLAN: Patient will continue care with her primary care physician. She should have an annual calcium determination.  Patient will return for surgical care as needed.  Velora Heckler, MD, [facility] Office: [phone]  Visit Diagnoses: 1. Hyperparathyroidism, primary

## 2013-10-01 ENCOUNTER — Ambulatory Visit (INDEPENDENT_AMBULATORY_CARE_PROVIDER_SITE_OTHER): Payer: 59 | Admitting: Licensed Clinical Social Worker

## 2013-10-01 DIAGNOSIS — F331 Major depressive disorder, recurrent, moderate: Secondary | ICD-10-CM

## 2013-10-06 ENCOUNTER — Ambulatory Visit: Payer: 59 | Admitting: Licensed Clinical Social Worker

## 2013-10-08 ENCOUNTER — Ambulatory Visit (INDEPENDENT_AMBULATORY_CARE_PROVIDER_SITE_OTHER): Payer: Medicare Other | Admitting: Internal Medicine

## 2013-10-08 ENCOUNTER — Other Ambulatory Visit: Payer: Self-pay | Admitting: Internal Medicine

## 2013-10-08 ENCOUNTER — Other Ambulatory Visit (INDEPENDENT_AMBULATORY_CARE_PROVIDER_SITE_OTHER): Payer: Medicare Other

## 2013-10-08 ENCOUNTER — Encounter: Payer: Self-pay | Admitting: Internal Medicine

## 2013-10-08 VITALS — BP 120/76 | HR 63 | Temp 97.5°F | Ht 65.0 in | Wt 209.0 lb

## 2013-10-08 DIAGNOSIS — Z0000 Encounter for general adult medical examination without abnormal findings: Secondary | ICD-10-CM

## 2013-10-08 DIAGNOSIS — F3289 Other specified depressive episodes: Secondary | ICD-10-CM

## 2013-10-08 DIAGNOSIS — E785 Hyperlipidemia, unspecified: Secondary | ICD-10-CM

## 2013-10-08 DIAGNOSIS — E559 Vitamin D deficiency, unspecified: Secondary | ICD-10-CM

## 2013-10-08 DIAGNOSIS — R7309 Other abnormal glucose: Secondary | ICD-10-CM

## 2013-10-08 DIAGNOSIS — E039 Hypothyroidism, unspecified: Secondary | ICD-10-CM

## 2013-10-08 DIAGNOSIS — E669 Obesity, unspecified: Secondary | ICD-10-CM | POA: Insufficient documentation

## 2013-10-08 DIAGNOSIS — F329 Major depressive disorder, single episode, unspecified: Secondary | ICD-10-CM

## 2013-10-08 DIAGNOSIS — M8580 Other specified disorders of bone density and structure, unspecified site: Secondary | ICD-10-CM

## 2013-10-08 LAB — HEPATIC FUNCTION PANEL
ALBUMIN: 3.9 g/dL (ref 3.5–5.2)
ALK PHOS: 61 U/L (ref 39–117)
ALT: 17 U/L (ref 0–35)
AST: 25 U/L (ref 0–37)
BILIRUBIN DIRECT: 0.1 mg/dL (ref 0.0–0.3)
TOTAL PROTEIN: 7 g/dL (ref 6.0–8.3)
Total Bilirubin: 0.7 mg/dL (ref 0.2–1.2)

## 2013-10-08 LAB — LIPID PANEL
Cholesterol: 239 mg/dL — ABNORMAL HIGH (ref 0–200)
HDL: 88.1 mg/dL (ref >39.00)
LDL Cholesterol: 139 mg/dL — ABNORMAL HIGH (ref 0–99)
NONHDL: 150.9
Total CHOL/HDL Ratio: 3
Triglycerides: 60 mg/dL (ref 0.0–149.0)
VLDL: 12 mg/dL (ref 0.0–40.0)

## 2013-10-08 LAB — HEMOGLOBIN A1C: Hgb A1c MFr Bld: 5.5 % (ref 4.6–6.5)

## 2013-10-08 LAB — TSH: TSH: 0.36 u[IU]/mL (ref 0.35–4.50)

## 2013-10-08 NOTE — Assessment & Plan Note (Signed)
To discuss Psychiatry referral with Dr Rolm Baptise

## 2013-10-08 NOTE — Assessment & Plan Note (Signed)
TSH 

## 2013-10-08 NOTE — Assessment & Plan Note (Signed)
BMET,Lipids, hepatic panel,  TSH

## 2013-10-08 NOTE — Progress Notes (Signed)
Subjective:    Patient ID: Gail Fuentes, female    DOB: 11-29-1945, 68 y.o.   MRN: [id number]  HPI  UHC/Medicare Wellness Visit: Psychosocial and medical history were reviewed as required by Medicare (history related to abuse, antisocial behavior , firearm risk). Social history: Caffeine: 1 cup /day , Alcohol: wine at least 2 glasses/night with intermittent abstinence , Tobacco use: smoked from age [age] up to 2PPD Exercise: intermittent Personal safety/fall risk: assessed, no issues Limitations of activities of daily living: none to date Seatbelt/ smoke alarm use: up to date Healthcare Power of Attorney/Living Will status: up to date Ophthalmologic exam status: up to date Hearing evaluation status: up to date, completed today Orientation: Oriented X 3 Memory and recall: 3/3 item recall Math testing: WNL Depression/anxiety assessment: pt is on Prozac & seeing Dr Marya Amsler, Psychologist.. Discussed referral to psychiatrist to adjust therapy if needed. Foreign travel history: never outside of [name] Immunization status for influenza/pneumonia/ shingles /tetanus: Shingles needed Transfusion history: none that pt is aware of  Preventive health care maintenance status: Colonoscopy/BMD/mammogram/Pap as per protocol/standard care: colonoscopy due this year, mammogram due in July, BMD up to date  Dental care: up to date  Chart reviewed and updated. Active issues reviewed and addressed as documented below.    Review of Systems Patient is also here to monitor thyroid status,HTN & hyperlipdemia There has been no change in the dose, brand, or mode of administration of thyroid supplement Last TSH  1.92 in 5/14  Pertinent negative or absent signs and symptoms are as follows: Constitutional: No significant change in weight; significant fatigue; sleep disorder; change in appetite. Eye: no blurred, double ,loss of vision Cardiovascular: no palpitations; racing; irregularity ENT/GI: no  constipation; diarrhea;hoarseness;dysphagia. Intermittent loose stool, probiotic taken. Derm: no change in nails,hair,skin Neuro: no numbness or tingling; tremor Psych:no panic attacks (see above) Endo: no temperature intolerance to heat ,cold     Objective:   Physical Exam Gen.: obesity present as per CDC Guidelines ;adequately nourished in appearance. Alert, appropriate and cooperative throughout exam.  Head: Normocephalic without obvious abnormalities Eyes: No corneal or conjunctival inflammation noted. Pupils equal round reactive to light and accommodation. Extraocular motion intact.  Ears: External  ear exam reveals no significant lesions or deformities. Canals clear .TMs normal. Hearing is grossly normal bilaterally. Nose: External nasal exam reveals no deformity or inflammation. Nasal mucosa are pink and moist. No lesions or exudates noted.   Mouth: Oral mucosa and oropharynx reveal no lesions or exudates. Teeth in good repair; upper & lower partials. Neck: No deformities, masses, or tenderness noted. Range of motion decreased. Thyroid  Normal.. Lungs: Normal respiratory effort; chest expands symmetrically. Lungs are clear to auscultation without rales, wheezes, or increased work of breathing. Heart: Normal rate and rhythm. Normal S1 and S2. No gallop, click, or rub. No murmur. Abdomen: Protuberant.Bowel sounds normal; abdomen soft and nontender. No masses, organomegaly or hernias noted. Genitalia: as per Gyn                                  Musculoskeletal/extremities: Accentuated curvature of upper thoracic spine. No clubbing, cyanosis, edema, or significant extremity  deformity noted. Range of motion normal .Tone & strength normal.Crepitus knees. Hand joints normal.  Fingernail / toenail health good. Able to lie down & sit up w/o help. Negative SLR bilaterally Vascular: Carotid, radial artery, dorsalis pedis and  posterior tibial pulses are full and equal.  No bruits  present. Neurologic: Alert and oriented x3. Deep tendon reflexes symmetrical and normal.  Gait normal .       Skin: Intact without suspicious lesions or rashes. Lymph: No cervical, axillary lymphadenopathy present. Psych: Mood and affect are normal. Normally interactive                                                                                        Assessment & Plan:  #1 Medicare Wellness Exam; criteria met ; data entered  See Current Assessment & Plan in Problem List under specific DiagnosisThe labs will be reviewed and risks and options assessed. Written recommendations will be provided by mail or directly through My Chart.Further evaluation or change in medical therapy will be directed by those results. 

## 2013-10-08 NOTE — Progress Notes (Signed)
Pre visit review using our clinic review tool, if applicable. No additional management support is needed unless otherwise documented below in the visit note. 

## 2013-10-08 NOTE — Patient Instructions (Addendum)
Your next office appointment will be determined based upon review of your pending labs . Those instructions will be transmitted to you through My Chart  As per the Standard of Care , screening Colonoscopy recommended @ 50 & every 5-10 years thereafter . More frequent monitor would be dictated by family history or findings @ Colonoscopy. Check with Dr Ardell Isaacs office as to when to repeat this.  Cardiovascular exercise, this can be as simple a program as walking, is recommended 30-45 minutes 3-4 times per week. If you're not exercising you should take 6-8 weeks to build up to this level.  If your symptoms of depression  persist or progress; I would recommend referral to a Neurochemist as we discussed.

## 2013-10-08 NOTE — Assessment & Plan Note (Addendum)
Vitamin D  35 on 09/03/13 Monitor annually; no supplement @ this time due to calcium /PTH issues which are resolving/resolved (see most recent values).

## 2013-10-09 NOTE — Assessment & Plan Note (Signed)
A1c

## 2013-10-13 ENCOUNTER — Encounter: Payer: Self-pay | Admitting: Gastroenterology

## 2013-10-13 ENCOUNTER — Ambulatory Visit (INDEPENDENT_AMBULATORY_CARE_PROVIDER_SITE_OTHER): Payer: 59 | Admitting: Licensed Clinical Social Worker

## 2013-10-13 DIAGNOSIS — F331 Major depressive disorder, recurrent, moderate: Secondary | ICD-10-CM

## 2013-10-29 ENCOUNTER — Encounter: Payer: Self-pay | Admitting: Gastroenterology

## 2013-10-29 ENCOUNTER — Encounter: Payer: Self-pay | Admitting: Cardiology

## 2013-11-03 ENCOUNTER — Ambulatory Visit (INDEPENDENT_AMBULATORY_CARE_PROVIDER_SITE_OTHER): Payer: 59 | Admitting: Licensed Clinical Social Worker

## 2013-11-03 DIAGNOSIS — F331 Major depressive disorder, recurrent, moderate: Secondary | ICD-10-CM

## 2013-12-03 ENCOUNTER — Ambulatory Visit (INDEPENDENT_AMBULATORY_CARE_PROVIDER_SITE_OTHER): Payer: 59 | Admitting: Licensed Clinical Social Worker

## 2013-12-03 DIAGNOSIS — F331 Major depressive disorder, recurrent, moderate: Secondary | ICD-10-CM

## 2013-12-23 ENCOUNTER — Ambulatory Visit (INDEPENDENT_AMBULATORY_CARE_PROVIDER_SITE_OTHER): Payer: 59 | Admitting: Licensed Clinical Social Worker

## 2013-12-23 DIAGNOSIS — F331 Major depressive disorder, recurrent, moderate: Secondary | ICD-10-CM

## 2014-01-06 ENCOUNTER — Other Ambulatory Visit: Payer: Self-pay | Admitting: Internal Medicine

## 2014-01-06 ENCOUNTER — Telehealth: Payer: Self-pay

## 2014-01-06 DIAGNOSIS — Z1211 Encounter for screening for malignant neoplasm of colon: Secondary | ICD-10-CM

## 2014-01-06 NOTE — Telephone Encounter (Signed)
Please be sure this patient is referred appropriately for their colonoscopy.

## 2014-01-06 NOTE — Telephone Encounter (Signed)
Per [employer] medicare, pt will need screenings for colonoscopy/FOBT on file for 2015 in order to satisfy Healthcare Quality Program. Thanks

## 2014-01-06 NOTE — Telephone Encounter (Signed)
Josh, this might be something Judeth Cornfield can pursue If patient willing; I'll refer for colonoscopy. I discussed colonoscopy @ Medicare Wellness exam 10/08/13

## 2014-01-08 ENCOUNTER — Ambulatory Visit (INDEPENDENT_AMBULATORY_CARE_PROVIDER_SITE_OTHER): Payer: 59 | Admitting: Licensed Clinical Social Worker

## 2014-01-08 ENCOUNTER — Encounter: Payer: Self-pay | Admitting: Internal Medicine

## 2014-01-08 DIAGNOSIS — F331 Major depressive disorder, recurrent, moderate: Secondary | ICD-10-CM

## 2014-01-12 ENCOUNTER — Telehealth: Payer: Self-pay | Admitting: *Deleted

## 2014-01-12 NOTE — Telephone Encounter (Signed)
Left msg on triage stating she been having sxs of diverticulitis. Requesting md to call in antibiotic. Called pt back inform  Her she will have to be seen before antibiotic can be rx. Made appt for tomorrow @ 11:15...Raechel Chute

## 2014-01-13 ENCOUNTER — Encounter: Payer: Self-pay | Admitting: Internal Medicine

## 2014-01-13 ENCOUNTER — Other Ambulatory Visit (INDEPENDENT_AMBULATORY_CARE_PROVIDER_SITE_OTHER): Payer: 59

## 2014-01-13 ENCOUNTER — Ambulatory Visit (INDEPENDENT_AMBULATORY_CARE_PROVIDER_SITE_OTHER): Payer: Medicare Other | Admitting: Internal Medicine

## 2014-01-13 VITALS — BP 140/90 | HR 61 | Temp 97.6°F | Wt 211.2 lb

## 2014-01-13 DIAGNOSIS — E039 Hypothyroidism, unspecified: Secondary | ICD-10-CM

## 2014-01-13 DIAGNOSIS — Z8719 Personal history of other diseases of the digestive system: Secondary | ICD-10-CM

## 2014-01-13 DIAGNOSIS — R109 Unspecified abdominal pain: Secondary | ICD-10-CM

## 2014-01-13 DIAGNOSIS — R198 Other specified symptoms and signs involving the digestive system and abdomen: Secondary | ICD-10-CM

## 2014-01-13 LAB — CBC WITH DIFFERENTIAL/PLATELET
BASOS ABS: 0 10*3/uL (ref 0.0–0.1)
Basophils Relative: 0.5 % (ref 0.0–3.0)
EOS ABS: 0.1 10*3/uL (ref 0.0–0.7)
Eosinophils Relative: 1.6 % (ref 0.0–5.0)
HCT: 37.4 % (ref 36.0–46.0)
Hemoglobin: 12.8 g/dL (ref 12.0–15.0)
LYMPHS PCT: 19.8 % (ref 12.0–46.0)
Lymphs Abs: 1.4 10*3/uL (ref 0.7–4.0)
MCHC: 34.2 g/dL (ref 30.0–36.0)
MCV: 90.7 fl (ref 78.0–100.0)
Monocytes Absolute: 0.6 10*3/uL (ref 0.1–1.0)
Monocytes Relative: 8.9 % (ref 3.0–12.0)
Neutro Abs: 4.8 10*3/uL (ref 1.4–7.7)
Neutrophils Relative %: 69.2 % (ref 43.0–77.0)
Platelets: 289 10*3/uL (ref 150.0–400.0)
RBC: 4.13 Mil/uL (ref 3.87–5.11)
RDW: 12.9 % (ref 11.5–15.5)
WBC: 6.9 10*3/uL (ref 4.0–10.5)

## 2014-01-13 LAB — TSH: TSH: 0.9 u[IU]/mL (ref 0.35–4.50)

## 2014-01-13 MED ORDER — HYOSCYAMINE SULFATE 0.125 MG SL SUBL: 0.1250 mg | SUBLINGUAL_TABLET | SUBLINGUAL | Status: DC | PRN

## 2014-01-13 NOTE — Progress Notes (Signed)
Pre visit review using our clinic review tool, if applicable. No additional management support is needed unless otherwise documented below in the visit note. 

## 2014-01-13 NOTE — Progress Notes (Signed)
   Subjective:    Patient ID: Gail Fuentes, female    DOB: 1946/04/03, 68 y.o.   MRN: [id number]  HPI   She describes bilateral lower abdominal quadrant discomfort which began 12/08/13; this actually awoke her from sleep. The preceding evening mesal was steak & salad. The pain has persisted but improved. She'll some lumbar area discomfort occasionally. She's had nausea without vomiting. Stools are described as loose and mucoid without frank diarrhea. She does feel as if she's bloated. She has no associated fever, chills, or sweats.  She has been avoiding high roughage such as popcorn or peanuts until the meal Friday night. Probiotic has been taken irregularly.   She does have history of diverticulosis/"itis" since her 30s. Triggers have included stressors (see below). Her last episode was 2013.  She has not had a colonoscopy since 2000. Her last abdominal imaging was in 2004 with cholecystectomy.   Review of Systems Unexplained weight loss,  significant dyspepsia, dysphagia, melena, rectal bleeding, or persistently small caliber stools are denied.  Dysuria, pyuria, hematuria, frequency, nocturia or polyuria are denied.  She describes increased stress in her life as she cares for a brother who has recurrent cancer. Her son and daughter-in-law are are going through a separation. She is on fluoxetine 40 mg daily from her Gyn.     Objective:   Physical Exam  Positive or pertinent findings include: She has upper and lower partials. Abdomen is protuberant. There is slight tenderness left lower quadrant. She has slight tenting. There is hammering of the second right toe  General appearance :adequately nourished; in no distress. Eyes: No conjunctival inflammation or scleral icterus is present. Oral exam: Lips and gums are healthy appearing.There is no oropharyngeal erythema or exudate noted.  Heart:  Normal rate and regular rhythm. S1 and S2 normal without gallop, murmur, click, rub or  other extra sounds   Lungs:Chest clear to auscultation; no wheezes, rhonchi,rales ,or rubs present.No increased work of breathing.  Abdomen: bowel sounds normal, soft  without masses, organomegaly or hernias noted.  No guarding or rebound. No flank tenderness to percussion. Skin:Warm & dry.  Intact without suspicious lesions or rashes ; no jaundice  Lymphatic: No lymphadenopathy is noted about the head, neck, axilla         Assessment & Plan:  # abdominal  pain , probable IBS #2 PMH diverticulitis See  Orders & AVS

## 2014-01-13 NOTE — Patient Instructions (Signed)
Please take a probiotic , Florastor OR Align, every day until the bowels are normal. This will replace the normal bacteria which  are necessary for formation of normal stool and processing of food. 

## 2014-01-13 NOTE — Progress Notes (Signed)
   Subjective:    Patient ID: Gail Fuentes, female    DOB: 1945-09-28, 68 y.o.   MRN: [id number]  HPI  Patient seen today for complaints of abdominal pain.  She stated that the pain awoke her from sleep Friday night after having a meal of steak and salad.  The pain has improved but persisted and is located in her lower abdomen bilaterally as well as lower lumbar area.  She has had nausea but no vomiting.  Loose, mucousy stools without frank diarrhea. She feels as though she is bloated. No fevers or chills.   Chronic medical issues also reviewed -  HTN - pt reports compliance with current medications and no adverse effects.  Systolic blood pressure at home ranges from 120-140's.  She is unsure of diastolic pressure.  She denies CV symptoms  Hypothyroidism - she reports being fairly compliant with synthroid.  Last TSH 10/08/13 0.36.  No heat or cold interelance.   Depression - patient has had several stressors recently including helping to care for her brother with recurrent cancer as well as her son and daughter in law going through a separation.  She has been on Prozac for 20 years with being on increased dose (40mg ) for the last 18 months.  She takes xanax 0.25mg  2-3 days per week.  She is seeing counselor regularly.    Review of Systems  Constitutional: Negative for fever, chills, activity change, appetite change and unexpected weight change.  HENT: Negative.   Respiratory: Negative for cough, choking, chest tightness, shortness of breath and wheezing.   Cardiovascular: Negative for chest pain, palpitations and leg swelling.  Gastrointestinal: Positive for nausea, abdominal pain, diarrhea and abdominal distention. Negative for vomiting and constipation.  Endocrine: Negative for cold intolerance and heat intolerance.  Skin: Negative for rash and wound.  Neurological: Negative for dizziness, syncope, weakness and headaches.  Psychiatric/Behavioral: Positive for dysphoric mood. Negative  for suicidal ideas.       Objective:   Physical Exam  Constitutional: She is oriented to person, place, and time. She appears well-developed and well-nourished. No distress.  Neck: Normal range of motion. Neck supple.  Cardiovascular: Normal rate and normal heart sounds.   No murmur heard. Pulmonary/Chest: Effort normal and breath sounds normal. No respiratory distress. She has no wheezes.  Abdominal: Soft. Bowel sounds are normal. She exhibits no distension and no mass. There is tenderness (lower abdomen bilaterally). There is no rebound and no guarding.  Musculoskeletal: Normal range of motion. She exhibits no edema and no tenderness.  Lymphadenopathy:    She has no cervical adenopathy.  Neurological: She is alert and oriented to person, place, and time.  Skin: Skin is warm and dry. She is not diaphoretic.  Psychiatric: She has a normal mood and affect. Her behavior is normal. Judgment and thought content normal.        Assessment & Plan:

## 2014-01-16 ENCOUNTER — Telehealth: Payer: Self-pay

## 2014-01-16 NOTE — Telephone Encounter (Signed)
Erroneous encounter

## 2014-01-18 ENCOUNTER — Other Ambulatory Visit: Payer: Self-pay | Admitting: Internal Medicine

## 2014-01-26 ENCOUNTER — Ambulatory Visit (INDEPENDENT_AMBULATORY_CARE_PROVIDER_SITE_OTHER): Payer: 59 | Admitting: Licensed Clinical Social Worker

## 2014-01-26 DIAGNOSIS — F331 Major depressive disorder, recurrent, moderate: Secondary | ICD-10-CM

## 2014-02-08 ENCOUNTER — Ambulatory Visit (INDEPENDENT_AMBULATORY_CARE_PROVIDER_SITE_OTHER): Payer: 59 | Admitting: Licensed Clinical Social Worker

## 2014-02-08 DIAGNOSIS — F332 Major depressive disorder, recurrent severe without psychotic features: Secondary | ICD-10-CM

## 2014-02-19 ENCOUNTER — Other Ambulatory Visit: Payer: Self-pay

## 2014-03-04 ENCOUNTER — Ambulatory Visit (INDEPENDENT_AMBULATORY_CARE_PROVIDER_SITE_OTHER): Payer: Medicare Other

## 2014-03-04 DIAGNOSIS — Z23 Encounter for immunization: Secondary | ICD-10-CM

## 2014-03-16 ENCOUNTER — Encounter: Payer: Self-pay | Admitting: Internal Medicine

## 2014-03-16 ENCOUNTER — Ambulatory Visit (INDEPENDENT_AMBULATORY_CARE_PROVIDER_SITE_OTHER): Payer: Medicare Other | Admitting: Internal Medicine

## 2014-03-16 VITALS — BP 114/66 | HR 62 | Temp 97.5°F | Resp 12 | Wt 214.6 lb

## 2014-03-16 DIAGNOSIS — E041 Nontoxic single thyroid nodule: Secondary | ICD-10-CM

## 2014-03-16 DIAGNOSIS — E039 Hypothyroidism, unspecified: Secondary | ICD-10-CM

## 2014-03-16 DIAGNOSIS — E210 Primary hyperparathyroidism: Secondary | ICD-10-CM

## 2014-03-16 LAB — TSH: TSH: 0.46 u[IU]/mL (ref 0.35–4.50)

## 2014-03-16 LAB — VITAMIN D 25 HYDROXY (VIT D DEFICIENCY, FRACTURES): VITD: 19.76 ng/mL — AB (ref 30.00–100.00)

## 2014-03-16 LAB — T4, FREE: FREE T4: 0.82 ng/dL (ref 0.60–1.60)

## 2014-03-16 NOTE — Progress Notes (Addendum)
Subjective:     Patient ID: Gail Fuentes, female   DOB: 30-Jul-1945, 68 y.o.   MRN: [id number]  HPI Gail Fuentes is a pleasant 68 y/o returning for f/u for dx hyperparathyroidism, s/p parathyroidectomy and a R thyroid nodule. At this visit  Last visit a year ago  Pt had a thyroid ultrasound (10/08/2012) showing only a right thyroid nodule, 7 x 7 x 7 mm, with internal calcifications.   A technetium sestamibi scan showed a left inferior parathyroid adenoma and a CT scan confirmed this. 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   She feels better after the surgery. She can think clearer and has more energy.   She c/o being still hoarse after the sx.   Reviewed previous PTH and Ca levels: Lab Results  Component Value Date   PTH 72.1* 09/03/2013   PTH 183.4* 04/20/2013   PTH 238.7* 10/20/2012   PTH 132.6* 10/02/2012   CALCIUM 9.3 09/03/2013   CALCIUM 9.2 04/20/2013   CALCIUM 9.1 03/16/2013   CALCIUM 9.1 02/27/2013   CALCIUM 10.7* 02/10/2013   CALCIUM 10.2 10/20/2012   CALCIUM 10.5 10/02/2012   CALCIUM 10.9* 10/01/2012   CALCIUM 10.7* 12/27/2011   CALCIUM 10.5 09/21/2010   CALCIUM 10.2 12/27/2008   CALCIUM 10.4 07/23/2007   CALCIUM 10.2 05/13/2006   She does not have a history of kidney stones, had an osteoporotic T score only at UD radius per DEXA in 2014, no fractures.  She has a h/o vit D def >> was on Ergo 50,000 IU once a week for 1-2 years. She also took 1000 units of vit D3 daily, too. She is now off the calcium and vitamin D. She got nausea with calcium supplements in the past, before the surgery.   Pt would also want me to check her thyroid level. She has a long h/o hypothyroidism.   She takes 125 mcg LT4: - sometimes fasting, sometimes after b'fast - along with PPI! and her other meds - no Calcium and MVI  Latest TSH levels: Lab Results   Component Value Date   TSH 0.90 01/13/2014   TSH 0.36 10/08/2013   TSH 1.92 10/01/2012   TSH 1.29 12/27/2011   FREET4 0.82 03/16/2014   Review of Systems Constitutional: + weight gain, + fatigue, no subjective hyperthermia/hypothermia, + poor sleep Eyes: + blurry vision, no xerophthalmia ENT: no sore throat, no  nodules palpated in throat, no dysphagia/no odynophagia, + hoarseness Cardiovascular: no CP/SOB/palpitations/leg swelling Respiratory: no cough/SOB/wheezing Gastrointestinal: + N/+ V/+ D/+ C/+ GERD Musculoskeletal: no muscle/+ joint aches Skin: no rashes, + hair loss, + Itching Neurological: no tremors/numbness/tingling/dizziness  I reviewed pt's medications, allergies, PMH, social hx, family hx and no changes required, except as mentioned above and she started Losartan and HCTZ.   Objective:   Physical Exam BP 114/66 mmHg  Pulse 62  Temp(Src) 97.5 F (36.4 C) (Oral)  Resp 12  Wt 214 lb 9.6 oz (97.342 kg)  SpO2 96% Wt Readings from Last 3 Encounters:  03/16/14 214 lb 9.6 oz (97.342 kg)  01/13/14 211 lb 4 oz (95.822 kg)  10/08/13 209 lb (94.802 kg)   Constitutional: overweight, in NAD Eyes: PERRLA, EOMI, no exophthalmos ENT: moist mucous membranes, no thyromegaly, cervical scar is still erythematous, swollen; no cervical lymphadenopathy; Chvostek sign negative bilaterally Cardiovascular: RRR, No MRG Respiratory: CTA B Gastrointestinal: abdomen soft, NT, ND, BS+ Musculoskeletal: no  deformities, strength intact in all 4 Skin: moist, warm, no rashes Neurological: very mild tremor with outstretched hands, DTR normal in all 4  Assessment:     1. Primary hyperparathyroidism - status post left superior parathyroidectomy 02/13/2013, by Dr. Gerrit Friends - calcium < 1 mg/dl above normal before the surgery - elevated PTH (133, 238) - normal vit D  - elevated 1,25 vitamin D - no renal failure - osteopenia  - no h/o kidney stones - DEXA 01/23/2010 ([facility]):  L1-4 T score  -2.0  Left femoral neck T score -1.9  Right femoral neck T score -1.4  10 year any fracture risk 13.8%, 10 year hip fracture risk 2.9% - CT neck 01/14/2013:    Focal nodular appearance along the posterior aspect of the left lobe of thyroid corresponds with a sestamibi exam and suggests a left lower parathyroid adenoma.   No significant nodular tissue adjacent to the submandibular glands.   Sub centimeter lymph nodes are likely within normal limits.  Focal calcification is noted within the right lobe of the thyroid.  - Tc sestamibi scan 12/29/2012: suspicious for left thyroid lobe region parathyroid  adenoma. Questionable abnormal focus also in the right submandibular space - DEXA 11/14/2012 ([facility]):  L1-4 T score -2.0 >> -2.1 (but high SD, L1 T score is -2.8)  Left femoral neck T score -1.9 >> -1.9  Right femoral neck T score -1.4 >> -1.7  33% distal radius: -1.5  But worrisome that UD radius -3.1 (good evaluation for trabecular bone) - UCa 300 mg/24h (11/06/2012) - thyroid ultrasound 10/08/2012: Thyroid echotexture is heterogeneous.   Right lobe: Measures 3 x 1.2 x 1.1 cm.   Left lobe: Measures 2.9 x 1.9 x 0.9 cm.   Isthmus: Measures 0.4 cm.   Focal lesions: Within the lower pole of the right lobe of thyroid gland there is a hypoechoic nodule containing coarsened calcifications. This measures 7 x 7 x 7 mm.   No lymphadenopathy identified.  She had parathyroidectomy with Dr Gerrit Friends on 02/13/2013.   2. Thyroid nodule  3. Hypothyroidism  Plan:     1. Primary Hyperparathyroidism: Pt with h/o primary hyperparathyroidism, with minimally elevated calcium levels but with very high PTH levels in the past  Localization studies were positive for left parathyroid adenoma and she had a parathyroidectomy on 02/13/2013. - She feels very well after the surgery - will check PTH and calcium today. I will add a vit D level.   2. R thyroid nodule - she had a R 7 mm thyroid nodule,  with internal calcification per last U/S 1.5 years ago - I suggested to repeat the U/S to check for growth - she agrees with the plan  3. Hypothyroidism - this was addressed per pt's request - we reviewed her previous TSH levels - normal - will repeat a TSH, fT4 today - she is on LT4 125 mcg daily >> continue this dose for now - However, she is taking the LT4 incorrectly: not always fasting and also along with her PPI and other meds - Take the thyroid hormone every day, with water, >30 minutes before breakfast, separated by >4 hours from acid reflux medications, calcium, iron, multivitamins. - may need a lower dose of LT4 if starts to separate it from PPI  Office Visit on 03/16/2014  Component Date Value Ref Range Status  . TSH 03/16/2014 0.46  0.35 - 4.50 uIU/mL Final  . Free T4 03/16/2014 0.82  0.60 - 1.60 ng/dL Final  . VITD 54/01/8118  19.76* 30.00 - 100.00 ng/mL Final  . Calcium 03/16/2014 8.8  8.7 - 10.3 mg/dL Final  . PTH 54/36/0677 44  15 - 65 pg/mL Final  . PTH 03/16/2014 Comment   Final   Comment: Interpretation                 Intact PTH    Calcium                                 (pg/mL)      (mg/dL) Normal                          15 - 65     8.6 - 10.2 Primary Hyperparathyroidism         >65          >10.2 Secondary Hyperparathyroidism       >65          <10.2 Non-Parathyroid Hypercalcemia       <65          >10.2 Hypoparathyroidism                  <15          < 8.6 Non-Parathyroid Hypocalcemia    15 - 65          < 8.6    Vit D deficiency >> start Ergocalciferol 50,000 units 1x a week x 8 weeks, then switch to 2000 units vit D3 daily. PTH and calcium normal! Since she is now taking the levothyroxine along with her PPI, we will remove the PPI later and decrease her levothyroxine to 112 g daily. I will call his dose in. We will need a repeat set of thyroid tests in 2 months.  Thyroid U/S:  Reola Calkins, MD 03/22/2014          Narrative        CLINICAL  DATA: Previous demonstration of 0.7 cm calcified inferior right thyroid nodule. Status post left superior parathyroidectomy in October, 2014 to remove parathyroid adenoma.  EXAM: THYROID ULTRASOUND  TECHNIQUE: Ultrasound examination of the thyroid gland and adjacent soft tissues was performed.  COMPARISON: 10/08/2012  FINDINGS: Right thyroid lobe  Measurements: 3.4 x 1.1 x 1.0 cm. Stable to slightly smaller focal area of nodularity with dystrophic shadowing calcifications measures approximately 0.6 cm in diameter. The thyroid parenchyma is very heterogeneous and shows no abnormal vascularity.  Left thyroid lobe  Measurements: 2.9 x 1.1 x 1.1 cm. No nodules visualized. Heterogeneous parenchyma noted without abnormal vascularity. The left lobe is small in size.  Isthmus  Thickness: 0.9 cm. No nodules visualized.  Lymphadenopathy  None visualized.  IMPRESSION: Stable to slightly smaller area of nodularity in the inferior right lobe of the thyroid containing dystrophic shadowing calcifications. Stable size and heterogeneous appearance of the thyroid parenchyma.   Electronically Signed By: Irish Lack M.D. On: 03/22/2014 16:27      Stable thyroid nodule.

## 2014-03-16 NOTE — Patient Instructions (Signed)
Please stop at the lab. You will be called to schedule the thyroid U/S. We will decide about the thyroid hormone dose after labs are back >> I will send you the results through MyChart. Take the thyroid hormone every day, with water, >30 minutes before breakfast, separated by >4 hours from acid reflux medications, calcium, iron, multivitamins.  Please come back for a follow-up appointment in 6 months

## 2014-03-17 ENCOUNTER — Other Ambulatory Visit: Payer: Self-pay | Admitting: Internal Medicine

## 2014-03-17 DIAGNOSIS — E041 Nontoxic single thyroid nodule: Secondary | ICD-10-CM | POA: Insufficient documentation

## 2014-03-17 LAB — PTH, INTACT AND CALCIUM
Calcium: 8.8 mg/dL (ref 8.7–10.3)
PTH: 44 pg/mL (ref 15–65)

## 2014-03-17 MED ORDER — LEVOTHYROXINE SODIUM 112 MCG PO TABS: 112.0000 ug | ORAL_TABLET | Freq: Every day | ORAL | Status: DC

## 2014-03-17 MED ORDER — VITAMIN D (ERGOCALCIFEROL) 1.25 MG (50000 UNIT) PO CAPS: 50000.0000 [IU] | ORAL_CAPSULE | ORAL | Status: DC

## 2014-03-22 ENCOUNTER — Ambulatory Visit
Admission: RE | Admit: 2014-03-22 | Discharge: 2014-03-22 | Disposition: A | Discharge status: Home / Self Care | Payer: Medicare Other | Source: Ambulatory Visit | Attending: Internal Medicine | Admitting: Internal Medicine

## 2014-03-22 DIAGNOSIS — E041 Nontoxic single thyroid nodule: Secondary | ICD-10-CM

## 2014-03-22 NOTE — Addendum Note (Signed)
Addended by: Carlus Pavlov on: 03/22/2014 05:03 PM   Modules accepted: Level of Service

## 2014-04-09 ENCOUNTER — Other Ambulatory Visit: Payer: Self-pay | Admitting: Internal Medicine

## 2014-05-18 ENCOUNTER — Other Ambulatory Visit: Payer: Self-pay

## 2014-05-19 ENCOUNTER — Other Ambulatory Visit (INDEPENDENT_AMBULATORY_CARE_PROVIDER_SITE_OTHER): Payer: Medicare Other

## 2014-05-19 DIAGNOSIS — E039 Hypothyroidism, unspecified: Secondary | ICD-10-CM

## 2014-05-19 LAB — T4, FREE: FREE T4: 0.95 ng/dL (ref 0.60–1.60)

## 2014-05-19 LAB — TSH: TSH: 0.28 u[IU]/mL — ABNORMAL LOW (ref 0.35–4.50)

## 2014-05-20 ENCOUNTER — Other Ambulatory Visit: Payer: Self-pay | Admitting: Internal Medicine

## 2014-05-20 DIAGNOSIS — E039 Hypothyroidism, unspecified: Secondary | ICD-10-CM

## 2014-05-21 ENCOUNTER — Telehealth: Payer: Self-pay | Admitting: Internal Medicine

## 2014-05-21 MED ORDER — LEVOTHYROXINE SODIUM 100 MCG PO TABS: 100.0000 ug | ORAL_TABLET | Freq: Every day | ORAL | Status: DC

## 2014-05-21 NOTE — Telephone Encounter (Signed)
Please read note below and advise.  

## 2014-05-21 NOTE — Telephone Encounter (Signed)
OK 

## 2014-05-21 NOTE — Telephone Encounter (Signed)
Patient states she could not respond back to Dr. Darrel Hoover message   Please send thyroid medication to [facility]  [address] [facility]    Thank you

## 2014-06-01 ENCOUNTER — Other Ambulatory Visit: Payer: Self-pay | Admitting: Internal Medicine

## 2014-07-13 ENCOUNTER — Other Ambulatory Visit (INDEPENDENT_AMBULATORY_CARE_PROVIDER_SITE_OTHER): Payer: Medicare Other

## 2014-07-13 DIAGNOSIS — E039 Hypothyroidism, unspecified: Secondary | ICD-10-CM

## 2014-07-13 LAB — T4, FREE: Free T4: 0.95 ng/dL (ref 0.60–1.60)

## 2014-07-13 LAB — TSH: TSH: 0.32 u[IU]/mL — ABNORMAL LOW (ref 0.35–4.50)

## 2014-07-16 ENCOUNTER — Telehealth: Payer: Self-pay | Admitting: *Deleted

## 2014-07-16 ENCOUNTER — Telehealth: Payer: Self-pay | Admitting: Internal Medicine

## 2014-07-16 NOTE — Telephone Encounter (Signed)
Returned pt's call. Lvm advising pt that I saw the MyChart message from Dr Elvera Lennox. Advised pt that Dr Elvera Lennox is out of the office today. Advised pt to respond to Dr Charlean Sanfilippo questions and she will better be able to decide what will be the next step with her thyroid dosage.

## 2014-07-16 NOTE — Telephone Encounter (Signed)
Opened encounter in error  

## 2014-07-16 NOTE — Telephone Encounter (Signed)
Pt would like for a call back regarding results she has received. Can we give her fu on her next steps with thyroid dosage.

## 2014-07-19 ENCOUNTER — Telehealth: Payer: Self-pay | Admitting: Internal Medicine

## 2014-07-19 ENCOUNTER — Other Ambulatory Visit: Payer: Self-pay | Admitting: *Deleted

## 2014-07-19 MED ORDER — LEVOTHYROXINE SODIUM 100 MCG PO TABS: 100.0000 ug | ORAL_TABLET | Freq: Every day | ORAL | Status: DC

## 2014-07-19 NOTE — Telephone Encounter (Signed)
Gail Fuentes, we can send the LT4 locally for 2 mo as we need to repeat then. Please find out if: - she is taking the 100 mcg or 112 mcg? I see both doses in her med list. - if she is taking the 112, let's switch to 100 mcg - if she is taking the 100, let's stay on same dose but have her take 1/2 tab on Sundays Need labs again in 6-8 weeks. Thank you, C

## 2014-07-19 NOTE — Telephone Encounter (Signed)
Pt letting you know that she is feeling OK. She needs refill on her med she has 3 [facility] left [facility] if [facility] only want to do one [facility] [address].She does want to proceed with taking the dosage you requested

## 2014-07-19 NOTE — Telephone Encounter (Signed)
Called pt and she is taking 100 mcg. Advised her to take 100 mg daily, but take 1/2 tablet on Sundays. Advised pt to schedule labs to be done in 6-8 weeks. Pt to call back and schedule.

## 2014-07-19 NOTE — Telephone Encounter (Signed)
Pt responding to MyChart message and questions from Dr Elvera Lennox. Please read message below and advise.

## 2014-07-27 ENCOUNTER — Encounter: Payer: Self-pay | Admitting: Gastroenterology

## 2014-08-24 ENCOUNTER — Ambulatory Visit (INDEPENDENT_AMBULATORY_CARE_PROVIDER_SITE_OTHER): Payer: Medicare Other | Admitting: Internal Medicine

## 2014-08-24 ENCOUNTER — Encounter: Payer: Self-pay | Admitting: Internal Medicine

## 2014-08-24 VITALS — BP 118/76 | HR 60 | Temp 97.3°F | Ht 65.0 in | Wt 215.0 lb

## 2014-08-24 DIAGNOSIS — M4124 Other idiopathic scoliosis, thoracic region: Secondary | ICD-10-CM

## 2014-08-24 DIAGNOSIS — M5414 Radiculopathy, thoracic region: Secondary | ICD-10-CM

## 2014-08-24 DIAGNOSIS — M419 Scoliosis, unspecified: Secondary | ICD-10-CM

## 2014-08-24 DIAGNOSIS — Z8639 Personal history of other endocrine, nutritional and metabolic disease: Secondary | ICD-10-CM | POA: Insufficient documentation

## 2014-08-24 MED ORDER — TRAMADOL HCL 50 MG PO TABS: ORAL_TABLET | ORAL | Status: DC

## 2014-08-24 MED ORDER — TIZANIDINE HCL 4 MG PO CAPS: ORAL_CAPSULE | ORAL | Status: DC

## 2014-08-24 NOTE — Patient Instructions (Signed)
  Scoliosis is a side to side or  S-shaped curvature of the spine. This is often associated with recurrent back pain especially after repetitive motion , hyperextension or rotation due to pinching of nerve roots with resultant muscle spasm. The best exercises to prevent recurrent back pain include freestyle swimming, stretch aerobics, and yoga. Scoliosis, unlike spinal stenosis, is not usually approached surgically except in pre adults.

## 2014-08-24 NOTE — Progress Notes (Signed)
Pre visit review using our clinic review tool, if applicable. No additional management support is needed unless otherwise documented below in the visit note. 

## 2014-08-24 NOTE — Progress Notes (Signed)
   Subjective:    Patient ID: Gail Fuentes, female    DOB: 1946-01-22, 69 y.o.   MRN: [id number]  HPI  Symptoms began 08/23/14 in the afternoon/evening as pain in the upper back after working in the yard. She had been been bent over potting plants for an extended period of time. The discomfort was noted to radiate from the mid thoracic area anteriorly to the area between the breasts. It is described as a dull ache with severity up to 3-4. It would be up to an 8 while trying to rest in bed last night. She had taken 4 baby aspirin to facilitate sleep. Bending over colorindg with her granddaughter for an hour aggravated the symptoms.  There was no chest pain, nausea, diaphoresis with exertion.  Review of Systems She denied associated numbness or tingling in upper extremities.  She's had no loss of control of her bladder or bowel. She does have some incontinence with coughing or standing from squatting position.   She has no constitutional symptoms of fever, chills, or sweats.  She has dyspepsia which is controlled with a PPI.    Objective:   Physical Exam Pertinent or positive findings include: There is accentuated curvature of the upper thoracic spine and there is suggestion of scoliosis manifested by asymmetry of the thoracic paraspinous musculature.  There is no cranial nerve deficit  Strength, tone, deep tendon reflexes  normal in upper and lower extremities.  General appearance :adequately nourished; in no distress. Eyes: No conjunctival inflammation or scleral icterus is present. Oral exam:  Lips and gums are healthy appearing.There is no oropharyngeal erythema or exudate noted. Dental hygiene is good. Heart:  Normal rate and regular rhythm. S1 and S2 normal without gallop, murmur, click, rub or other extra sounds   Lungs:Chest clear to auscultation; no wheezes, rhonchi,rales ,or rubs present.No increased work of breathing.  Abdomen: bowel sounds normal, soft and non-tender  without masses, organomegaly or hernias noted.  No guarding or rebound.  No spinal tenderness to percussion. Vascular : all pulses equal ; no bruits present. Skin:Warm & dry.  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

## 2014-09-09 ENCOUNTER — Other Ambulatory Visit (HOSPITAL_COMMUNITY): Payer: Self-pay | Admitting: Obstetrics & Gynecology

## 2014-09-10 LAB — CYTOLOGY - PAP

## 2014-09-14 ENCOUNTER — Other Ambulatory Visit: Payer: Medicare Other

## 2014-09-14 ENCOUNTER — Other Ambulatory Visit: Payer: Self-pay | Admitting: *Deleted

## 2014-09-14 DIAGNOSIS — E039 Hypothyroidism, unspecified: Secondary | ICD-10-CM

## 2014-09-17 ENCOUNTER — Other Ambulatory Visit: Payer: Self-pay | Admitting: Internal Medicine

## 2014-09-20 ENCOUNTER — Telehealth: Payer: Self-pay | Admitting: Internal Medicine

## 2014-09-20 NOTE — Telephone Encounter (Signed)
Pt would like the results of her labs and she only has 6 more thyroid pills and does get her meds through mail order

## 2014-09-21 ENCOUNTER — Other Ambulatory Visit (INDEPENDENT_AMBULATORY_CARE_PROVIDER_SITE_OTHER): Payer: Medicare Other

## 2014-09-21 ENCOUNTER — Other Ambulatory Visit: Payer: Self-pay | Admitting: *Deleted

## 2014-09-21 ENCOUNTER — Other Ambulatory Visit: Payer: Self-pay | Admitting: Internal Medicine

## 2014-09-21 DIAGNOSIS — E039 Hypothyroidism, unspecified: Secondary | ICD-10-CM | POA: Diagnosis not present

## 2014-09-21 LAB — TSH: TSH: 0.95 u[IU]/mL (ref 0.35–4.50)

## 2014-09-21 LAB — T4, FREE: FREE T4: 0.77 ng/dL (ref 0.60–1.60)

## 2014-09-21 MED ORDER — LEVOTHYROXINE SODIUM 100 MCG PO TABS: 100.0000 ug | ORAL_TABLET | Freq: Every day | ORAL | Status: DC

## 2014-09-21 NOTE — Telephone Encounter (Signed)
Called pt and advised her that the lab lost the vial and she will need to do repeat labs. Pt voiced understanding. Pt is going to the lab at [place] today. Pt will need to have thyroid rx refilled. Be advised.

## 2014-09-24 ENCOUNTER — Other Ambulatory Visit (INDEPENDENT_AMBULATORY_CARE_PROVIDER_SITE_OTHER): Payer: Medicare Other

## 2014-09-24 ENCOUNTER — Ambulatory Visit (INDEPENDENT_AMBULATORY_CARE_PROVIDER_SITE_OTHER): Payer: Medicare Other | Admitting: Gastroenterology

## 2014-09-24 ENCOUNTER — Encounter: Payer: Self-pay | Admitting: Gastroenterology

## 2014-09-24 VITALS — BP 118/70 | HR 60 | Ht 64.75 in | Wt 214.5 lb

## 2014-09-24 DIAGNOSIS — K921 Melena: Secondary | ICD-10-CM | POA: Diagnosis not present

## 2014-09-24 DIAGNOSIS — R079 Chest pain, unspecified: Secondary | ICD-10-CM | POA: Diagnosis not present

## 2014-09-24 DIAGNOSIS — R1013 Epigastric pain: Secondary | ICD-10-CM

## 2014-09-24 DIAGNOSIS — K219 Gastro-esophageal reflux disease without esophagitis: Secondary | ICD-10-CM

## 2014-09-24 LAB — COMPREHENSIVE METABOLIC PANEL
ALT: 13 U/L (ref 0–35)
AST: 20 U/L (ref 0–37)
Albumin: 3.9 g/dL (ref 3.5–5.2)
Alkaline Phosphatase: 62 U/L (ref 39–117)
BILIRUBIN TOTAL: 0.4 mg/dL (ref 0.2–1.2)
BUN: 12 mg/dL (ref 6–23)
CO2: 28 meq/L (ref 19–32)
Calcium: 9.4 mg/dL (ref 8.4–10.5)
Chloride: 103 mEq/L (ref 96–112)
Creatinine, Ser: 0.86 mg/dL (ref 0.40–1.20)
GFR: 69.82 mL/min (ref >60.00)
GLUCOSE: 92 mg/dL (ref 70–99)
Potassium: 4.5 mEq/L (ref 3.5–5.1)
Sodium: 136 mEq/L (ref 135–145)
Total Protein: 6.9 g/dL (ref 6.0–8.3)

## 2014-09-24 LAB — CBC WITH DIFFERENTIAL/PLATELET
BASOS ABS: 0 10*3/uL (ref 0.0–0.1)
Basophils Relative: 0.3 % (ref 0.0–3.0)
EOS ABS: 0 10*3/uL (ref 0.0–0.7)
Eosinophils Relative: 0.7 % (ref 0.0–5.0)
HCT: 36.3 % (ref 36.0–46.0)
HEMOGLOBIN: 12.4 g/dL (ref 12.0–15.0)
LYMPHS PCT: 19 % (ref 12.0–46.0)
Lymphs Abs: 1.2 10*3/uL (ref 0.7–4.0)
MCHC: 34.2 g/dL (ref 30.0–36.0)
MCV: 90 fl (ref 78.0–100.0)
MONO ABS: 0.6 10*3/uL (ref 0.1–1.0)
Monocytes Relative: 9.2 % (ref 3.0–12.0)
NEUTROS ABS: 4.5 10*3/uL (ref 1.4–7.7)
Neutrophils Relative %: 70.8 % (ref 43.0–77.0)
Platelets: 285 10*3/uL (ref 150.0–400.0)
RBC: 4.03 Mil/uL (ref 3.87–5.11)
RDW: 13 % (ref 11.5–15.5)
WBC: 6.3 10*3/uL (ref 4.0–10.5)

## 2014-09-24 LAB — LIPASE: Lipase: 35 U/L (ref 11.0–59.0)

## 2014-09-24 MED ORDER — NA SULFATE-K SULFATE-MG SULF 17.5-3.13-1.6 GM/177ML PO SOLN
1.0000 | Freq: Once | ORAL | Status: DC
Start: 2014-09-24 — End: 2014-11-19

## 2014-09-24 NOTE — Progress Notes (Signed)
    History of Present Illness: This is a 69 year old female referred by Pecola Lawless, MD for the evaluation of epigastric pain, lower sternal pain, rectal bleeding on tissue and abdominal bloating. She notes frequent reflux symptoms and pain in her lower sternum and epigastric area for several months that is sometimes related to reflux and at other times is painful unrelated to meals or swallowing. She has noted small amounts of bright red blood per rectum on the tissue paper for the past several weeks. 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 In addition the antrum was noted to be poorly distensible. She underwent colonoscopy in 2005 showing only diverticulosis. Denies weight loss, constipation, diarrhea, change in stool caliber, melena, nausea, vomiting, dysphagia.   Review of Systems: Pertinent positive and negative review of systems were noted in the above HPI section. All other review of systems were otherwise negative.  Current Medications, Allergies, Past Medical History, Past Surgical History, Family History and Social History were reviewed in [employer] record.  Physical Exam: General: Well developed, well nourished, obese, no acute distress Head: Normocephalic and atraumatic Eyes:  sclerae anicteric, EOMI Ears: Normal auditory acuity Mouth: No deformity or lesions Neck: Supple, no masses or thyromegaly Lungs: Clear throughout to auscultation, mild lower sternal and xiphoid area tenderness Heart: Regular rate and rhythm; no murmurs, rubs or bruits Abdomen: Soft, mild epigastric tenderness and non distended. No masses, hepatosplenomegaly or hernias noted. Normal Bowel sounds Rectal: Deferred to colonoscopy Musculoskeletal: Symmetrical with no gross deformities  Skin: No lesions on visible extremities Pulses:  Normal pulses noted Extremities: No clubbing,  cyanosis, edema or deformities noted Neurological: Alert oriented x 4, grossly nonfocal Cervical Nodes:  No significant cervical adenopathy Inguinal Nodes: No significant inguinal adenopathy Psychological:  Alert and cooperative. Anxious.    Assessment and Recommendations:  1. GERD, epigastric pain and chest pain. Chest pain appears to be multifactorial related to GERD and musculoskeletal factors. CMET, CBC and lipase today. Continue omeprazole 20 mg daily and standard antireflux measures. Schedule EGD. The risks (including bleeding, perforation, infection, missed lesions, medication reactions and possible hospitalization or surgery if complications occur), benefits, and alternatives to endoscopy with possible biopsy and possible dilation were discussed with the patient and they consent to proceed.   2. Rectal bleeding. I suspect a benign anorectal source such as hemorrhoids however colorectal neoplasms to be further excluded. Schedule colonoscopy. The risks (including bleeding, perforation, infection, missed lesions, medication reactions and possible hospitalization or surgery if complications occur), benefits, and alternatives to endoscopy with possible biopsy and possible dilation were discussed with the patient and they consent to proceed.      cc: Pecola Lawless, MD [address] [place], [place] [address]

## 2014-09-24 NOTE — Patient Instructions (Signed)
Your physician has requested that you go to the basement for lab work before leaving today.  Please take your omeprazole once daily every day.   Patient advised to avoid spicy, acidic, citrus, chocolate, mints, fruit and fruit juices.  Limit the intake of caffeine, alcohol and Soda.  Don't exercise too soon after eating.  Don't lie down within 3-4 hours of eating.  Elevate the head of your bed.  You have been scheduled for an endoscopy and colonoscopy. Please follow the written instructions given to you at your visit today. Please pick up your prep supplies at the pharmacy within the next 1-3 days. If you use inhalers (even only as needed), please bring them with you on the day of your procedure. Your physician has requested that you go to www.startemmi.com and enter the access code given to you at your visit today. This web site gives a general overview about your procedure. However, you should still follow specific instructions given to you by our office regarding your preparation for the procedure.  Thank you for choosing me and [facility].  Venita Lick. Pleas Koch., MD., Clementeen Graham

## 2014-09-28 ENCOUNTER — Other Ambulatory Visit: Payer: Self-pay | Admitting: *Deleted

## 2014-09-28 MED ORDER — LEVOTHYROXINE SODIUM 100 MCG PO TABS: 100.0000 ug | ORAL_TABLET | Freq: Every day | ORAL | Status: DC

## 2014-09-28 NOTE — Telephone Encounter (Signed)
Pt needs her Levothyroxine sent to [facility]. Done.

## 2014-10-12 ENCOUNTER — Ambulatory Visit (INDEPENDENT_AMBULATORY_CARE_PROVIDER_SITE_OTHER): Payer: Medicare Other | Admitting: Internal Medicine

## 2014-10-12 ENCOUNTER — Other Ambulatory Visit: Payer: Self-pay | Admitting: Internal Medicine

## 2014-10-12 ENCOUNTER — Other Ambulatory Visit (INDEPENDENT_AMBULATORY_CARE_PROVIDER_SITE_OTHER): Payer: Medicare Other

## 2014-10-12 ENCOUNTER — Encounter: Payer: Self-pay | Admitting: Internal Medicine

## 2014-10-12 VITALS — BP 130/86 | HR 57 | Temp 97.7°F | Resp 16 | Ht 65.0 in | Wt 214.0 lb

## 2014-10-12 DIAGNOSIS — I10 Essential (primary) hypertension: Secondary | ICD-10-CM | POA: Diagnosis not present

## 2014-10-12 DIAGNOSIS — E559 Vitamin D deficiency, unspecified: Secondary | ICD-10-CM | POA: Diagnosis not present

## 2014-10-12 DIAGNOSIS — E039 Hypothyroidism, unspecified: Secondary | ICD-10-CM

## 2014-10-12 DIAGNOSIS — Z0000 Encounter for general adult medical examination without abnormal findings: Secondary | ICD-10-CM | POA: Diagnosis not present

## 2014-10-12 DIAGNOSIS — E785 Hyperlipidemia, unspecified: Secondary | ICD-10-CM

## 2014-10-12 LAB — VITAMIN D 25 HYDROXY (VIT D DEFICIENCY, FRACTURES): VITD: 30.52 ng/mL (ref 30.00–100.00)

## 2014-10-12 MED ORDER — HYDROCHLOROTHIAZIDE 12.5 MG PO CAPS: 12.5000 mg | ORAL_CAPSULE | Freq: Every day | ORAL | Status: DC

## 2014-10-12 NOTE — Assessment & Plan Note (Signed)
NMR LipoProfile  Thyroid function and hepatic function monitor are up-to-date

## 2014-10-12 NOTE — Progress Notes (Signed)
Pre visit review using our clinic review tool, if applicable. No additional management support is needed unless otherwise documented below in the visit note. 

## 2014-10-12 NOTE — Patient Instructions (Signed)
  Your next office appointment will be determined based upon review of your pending labs .  Those written interpretation of the lab results and instructions will be transmitted to you by My Chart    Critical results will be called.   Followup as needed for any active or acute issue. Please report any significant change in your symptoms.   Please complete the scheduled colonoscopic and endoscopic evaluations as scheduled by Dr. Russella Dar.

## 2014-10-12 NOTE — Progress Notes (Signed)
Subjective:    Patient ID: Gail Fuentes, female    DOB: 11-04-1945, 69 y.o.   MRN: [id number]  HPI  Medicare Wellness Visit: Psychosocial and medical history were reviewed as required by Medicare (history related to abuse, antisocial behavior , firearm risk). Social history: Caffeine: One cup per day Alcohol:  2 drinks per night Tobacco use: Described below Exercise: Described below Personal safety/fall risk: No Limitations of activities of daily living: No  Seatbelt/ smoke alarm use: Yes Healthcare Power of Attorney/Living Will status and End of Life process assessment : Will needs updating Ophthalmologic exam status: Exam last week and again today Hearing evaluation status: Not current Orientation: Oriented X 3  Memory and recall: Excellent Spelling  testing:  Excellent Depression/anxiety assessment:  Controlled  with present regimen from her gynecologist Foreign travel history: Never Immunization status for influenza/pneumonia/ shingles /tetanus: Verification will be pursued by Gareth Morgan Transfusion history: Never Preventive health care maintenance status: Colonoscopy/BMD/mammogram/Pap as per protocol/standard care: Gyn care UTD through Dr Marilu Favre below Dental care: Every 6 months Chart reviewed and updated. Active issues reviewed and addressed as documented below.  She does eat some fried foods and red meat. She does restrict salt. Exercise consists of stretching for 15 minutes a day.  She did have some blood on the toilet tissue after having a bowel movement recently but this has resolved. Follow-up colonoscopy scheduled 11/24/14.  She has had some visual changes despite replacing her contacts. She was seen last week and will be seen again today.  She states that her blood pressure averages less than 140/80. She has no cardiovascular symptoms.  Her hyperparathyroidism and hypothyroidism are followed by Dr.Gherghe . The thyroid dose was recently decreased.  Extensive labs were performed 09/24/14. These included chemistry profile, lipase, CBC, and glucose. In March her TSH had been 0.32 prior to the adjustment. Follow-up TSH on 09/21/14 was 0.95.  Her vitamin D level was 19 in December 2015. Vitamin D supplement was increased to 2000 international units daily.   Review of Systems  Chest pain, palpitations, tachycardia, exertional dyspnea, paroxysmal nocturnal dyspnea, claudication or edema are absent. No unexplained weight loss, abdominal pain, significant dyspepsia, dysphagia, melena,persistent rectal bleeding, or persistently small caliber stools. Dysuria, pyuria, hematuria, frequency, nocturia or polyuria are denied. Change in hair, skin, nails denied. No bowel changes of constipation or diarrhea. No intolerance to heat or cold.     Objective:   Physical Exam Pertinent or positive findings include: She has an upper plate. Abdomen is protuberant. There is crepitus of the knees greater on the right than the left. She has a minor isolated DIP flexion change of the right fifth finger. She also has isolated DIP osteoarthritic changes which are minor.   General appearance :adequately nourished; in no distress.  Eyes: No conjunctival inflammation or scleral icterus is present.  Oral exam:  Lips and gums are healthy appearing.There is no oropharyngeal erythema or exudate noted.  Hearing normal @ 6 feet.Normal TMs.  Heart:  Normal rate and regular rhythm. S1 and S2 normal without gallop, murmur, click, rub or other extra sounds    Lungs:Chest clear to auscultation; no wheezes, rhonchi,rales ,or rubs present.No increased work of breathing.   Abdomen: bowel sounds normal, soft and non-tender without masses, organomegaly or hernias noted.  No guarding or rebound.   GU:as per Gyn  Vascular : all pulses equal ; no bruits present.  Skin:Warm & dry.  Intact without suspicious lesions or rashes ; no tenting or jaundice  Lymphatic: No  lymphadenopathy is noted about the head, neck, axilla   Neuro: Strength, tone & DTRs normal.        Assessment & Plan:   #1 Medicare Wellness Exam; criteria met ; data entered #2 See Current Assessment & Plan in Problem List under specific Diagnosis. The labs will be reviewed and risks and options assessed.  Written recommendations will be provided by mail or directly through My Chart. Further evaluation or change in medical therapy will be directed by those results.

## 2014-10-14 LAB — NMR LIPOPROFILE WITH LIPIDS
CHOLESTEROL, TOTAL: 236 mg/dL — AB (ref 100–199)
HDL Particle Number: 38.3 umol/L (ref >=30.5)
HDL Size: 10.1 nm (ref >=9.2)
HDL-C: 90 mg/dL (ref >39)
LDL CALC: 124 mg/dL — AB (ref 0–99)
LDL PARTICLE NUMBER: 1366 nmol/L — AB (ref <1000)
LDL Size: 22.1 nm (ref >=20.8)
LP-IR Score: 51 — ABNORMAL HIGH (ref <=45)
Large HDL-P: 18.1 umol/L (ref >=4.8)
Large VLDL-P: 4.2 nmol/L — ABNORMAL HIGH (ref <=2.7)
TRIGLYCERIDES: 109 mg/dL (ref 0–149)
VLDL SIZE: 68.9 nm — AB (ref <=46.6)

## 2014-11-01 ENCOUNTER — Other Ambulatory Visit: Payer: Self-pay

## 2014-11-15 ENCOUNTER — Ambulatory Visit (INDEPENDENT_AMBULATORY_CARE_PROVIDER_SITE_OTHER): Payer: Medicare Other | Admitting: Internal Medicine

## 2014-11-15 VITALS — BP 106/64 | HR 60 | Temp 97.5°F | Resp 12 | Wt 217.0 lb

## 2014-11-15 DIAGNOSIS — Z8639 Personal history of other endocrine, nutritional and metabolic disease: Secondary | ICD-10-CM | POA: Diagnosis not present

## 2014-11-15 DIAGNOSIS — E039 Hypothyroidism, unspecified: Secondary | ICD-10-CM

## 2014-11-15 DIAGNOSIS — E041 Nontoxic single thyroid nodule: Secondary | ICD-10-CM | POA: Diagnosis not present

## 2014-11-15 LAB — TSH: TSH: 1.27 u[IU]/mL (ref 0.35–4.50)

## 2014-11-15 LAB — T4, FREE: Free T4: 0.92 ng/dL (ref 0.60–1.60)

## 2014-11-15 NOTE — Progress Notes (Signed)
Subjective:     Patient ID: Glory Rosebush, female   DOB: 1945/12/09, 69 y.o.   MRN: [id number]  HPI [name] is a pleasant 69 y/o returning for f/u for dx hyperparathyroidism, s/p parathyroidectomy, hypothyroidism, and a R thyroid nodule. Last visit 8 mo ago  Pt had a thyroid ultrasound (10/08/2012) showing only a right thyroid nodule, 7 x 7 x 7 mm, with internal calcifications.   A technetium sestamibi scan showed a left inferior parathyroid adenoma and a CT scan confirmed this. However, during surgery, it was found that she had a left superior parathyroid adenoma, which was excised - left superior parathyroidectomy with Dr Gerrit Friends on 02/13/2013.   She feels better after the surgery. She feels she can think clearer and has more energy. For the last 2 weeks, however, she felt more tired, but she tells me that this "comes and goes".  She c/o being still hoarse after the sx. She feels drainage in the back of her throat and feels she needs to clear her throat more frequently.  Reviewed previous PTH and Ca levels: Lab Results  Component Value Date   PTH 44 03/16/2014   PTH Comment 03/16/2014   PTH 72.1* 09/03/2013   PTH 183.4* 04/20/2013   PTH 238.7* 10/20/2012   CALCIUM 9.4 09/24/2014   CALCIUM 8.8 03/16/2014   CALCIUM 9.3 09/03/2013   CALCIUM 9.2 04/20/2013   CALCIUM 9.1 03/16/2013   CALCIUM 9.1 02/27/2013   CALCIUM 10.7* 02/10/2013   CALCIUM 10.2 10/20/2012   CALCIUM 10.5 10/02/2012   CALCIUM 10.9* 10/01/2012   CALCIUM 10.7* 12/27/2011   CALCIUM 10.5 09/21/2010   CALCIUM 10.2 12/27/2008   CALCIUM 10.4 07/23/2007   CALCIUM 10.2 05/13/2006   She does not have a history of kidney stones, had an osteoporotic T score only at UD radius per DEXA in 2014, no fractures.  She has a h/o vit D def >> was on Ergo 50,000 IU once a week for 1-2 years. Now on 2000 units of vit D3 daily. She got nausea with calcium supplements in the past, before the surgery.  Latest vit D level was  normal: Component     Latest Ref Rng 10/12/2014  VITD     30.00 - 100.00 ng/mL 30.52   She has a long h/o hypothyroidism.   She takes 100 mcg LT4 6/7 days and 50 mcg 1/7 days - dose decreased at last visit: - now fasting - east b'fast >30 min later - now separated from PPIs - later pm or evening - no Calcium and MVI  Latest TSH levels: Lab Results  Component Value Date   TSH 0.95 09/21/2014   TSH 0.32* 07/13/2014   TSH 0.28* 05/19/2014   TSH 0.46 03/16/2014   TSH 0.90 01/13/2014   FREET4 0.77 09/21/2014   FREET4 0.95 07/13/2014   FREET4 0.95 05/19/2014   FREET4 0.82 03/16/2014    Review of Systems Constitutional: + weight gain, no fatigue, no subjective hyperthermia/hypothermia, no poor sleep Eyes: + blurry vision, no xerophthalmia ENT: no sore throat, no  nodules palpated in throat, no dysphagia/no odynophagia, + hoarseness, + tinnitus Cardiovascular: no CP/SOB/palpitations/leg swelling Respiratory: no cough/SOB/wheezing Gastrointestinal: no N/V/D/C/GERD Musculoskeletal: no muscle/joint aches Skin: no rashes, no hair loss Neurological: no tremors/numbness/tingling/dizziness  I reviewed pt's medications, allergies, PMH, social hx, family hx, and changes were documented in the history of present illness. Otherwise, unchanged from my initial visit note.   Objective:   Physical Exam BP 106/64 mmHg  Pulse 60  Temp(Src) 97.5 F (36.4 C) (Oral)  Resp 12  Wt 217 lb (98.431 kg)  SpO2 97% Wt Readings from Last 3 Encounters:  11/15/14 217 lb (98.431 kg)  10/12/14 214 lb (97.07 kg)  09/24/14 214 lb 8 oz (97.297 kg)   Constitutional: overweight, in NAD Eyes: PERRLA, EOMI, no exophthalmos ENT: moist mucous membranes, no thyromegaly, cervical scar is still erythematous, swollen; no cervical lymphadenopathy; Chvostek sign negative bilaterally Cardiovascular: RRR, No MRG Respiratory: CTA B Gastrointestinal: abdomen soft, NT, ND, BS+ Musculoskeletal: no deformities,  strength intact in all 4 Skin: moist, warm, no rashes Neurological: very mild tremor with outstretched hands, DTR normal in all 4  Assessment:     1. Primary hyperparathyroidism - status post left superior parathyroidectomy 02/13/2013, by Dr. Gerrit Friends - calcium < 1 mg/dl above normal before the surgery - elevated PTH (133, 238) - normal vit D  - elevated 1,25 vitamin D - no renal failure - osteopenia  - no h/o kidney stones - DEXA 01/23/2010 ([facility]):  L1-4 T score -2.0  Left femoral neck T score -1.9  Right femoral neck T score -1.4  10 year any fracture risk 13.8%, 10 year hip fracture risk 2.9% - CT neck 01/14/2013:    Focal nodular appearance along the posterior aspect of the left lobe of thyroid corresponds with a sestamibi exam and suggests a left lower parathyroid adenoma.   No significant nodular tissue adjacent to the submandibular glands.   Sub centimeter lymph nodes are likely within normal limits.  Focal calcification is noted within the right lobe of the thyroid.  - Tc sestamibi scan 12/29/2012: suspicious for left thyroid lobe region parathyroid  adenoma. Questionable abnormal focus also in the right submandibular space - DEXA 11/14/2012 ([facility]):  L1-4 T score -2.0 >> -2.1 (but high SD, L1 T score is -2.8)  Left femoral neck T score -1.9 >> -1.9  Right femoral neck T score -1.4 >> -1.7  33% distal radius: -1.5  But worrisome that UD radius -3.1 (good evaluation for trabecular bone) - UCa 300 mg/24h (11/06/2012)   She had parathyroidectomy with Dr Gerrit Friends on 02/13/2013.   2. Thyroid nodule - thyroid ultrasound 10/08/2012: Thyroid echotexture is heterogeneous.   Right lobe: Measures 3 x 1.2 x 1.1 cm.   Left lobe: Measures 2.9 x 1.9 x 0.9 cm.   Isthmus: Measures 0.4 cm.   Focal lesions: Within the lower pole of the right lobe of thyroid gland there is a hypoechoic nodule containing coarsened calcifications. This measures 7 x 7 x 7 mm.   No  lymphadenopathy identified. - thyroid U/S 03/22/2014:  Right thyroid lobe: 3.4 x 1.1 x 1.0 cm. Stable to slightly smaller focal area of nodularity with dystrophic shadowing calcifications measures approximately 0.6 cm in diameter. The thyroid parenchyma is very heterogeneous and shows no abnormal vascularity.  Left thyroid lobe: 2.9 x 1.1 x 1.1 cm. No nodules visualized. Heterogeneous parenchyma noted without abnormal vascularity. The left lobe is small in size.  Isthmus Thickness: 0.9 cm. No nodules visualized.  Lymphadenopathy None visualized.  3. Hypothyroidism  Plan:     1. Primary Hyperparathyroidism: Pt with h/o primary hyperparathyroidism, with minimally elevated calcium levels but with very high PTH levels in the past  Localization studies were positive for left parathyroid adenoma and she had a parathyroidectomy on 02/13/2013 >> 0.5 g adenoma resected >> Ca and PTH normal postop - She feels very well after the surgery - will check PTH and calcium today. Recent vit D level  reviewed: normal.   2. R thyroid nodule - she had a R 7 mm thyroid nodule, with internal calcification - stable per last U/S 8 mo ago - repeat the U/S to check for growth in 3-5 years  3. Hypothyroidism - we reviewed her previous TSH levels - normal - will repeat a TSH, fT4 today - she is on LT4 100 mcg 6/7 and 50 mcg 1/7 days >> continue this dose for now - she is taking the LT4 correctly now: every day, with water, >30 minutes before breakfast, separated by >4 hours from acid reflux medications, calcium, iron, multivitamins.  Office Visit on 11/15/2014  Component Date Value Ref Range Status  . TSH 11/15/2014 1.27  0.35 - 4.50 uIU/mL Final  . Free T4 11/15/2014 0.92  0.60 - 1.60 ng/dL Final  . Calcium 16/02/9603 9.5  8.7 - 10.3 mg/dL Final  . PTH 54/01/8118 41  15 - 65 pg/mL Final  . PTH 11/15/2014 Comment   Final   Comment: Interpretation                 Intact PTH    Calcium                                  (pg/mL)      (mg/dL) Normal                          15 - 65     8.6 - 10.2 Primary Hyperparathyroidism         >65          >10.2 Secondary Hyperparathyroidism       >65          <10.2 Non-Parathyroid Hypercalcemia       <65          >10.2 Hypoparathyroidism                  <15          < 8.6 Non-Parathyroid Hypocalcemia    15 - 65          < 8.6    Excellent results! No signs of recurrence of her hyperparathyroidism. Thyroid tests are normal on the current dose of levothyroxine.

## 2014-11-15 NOTE — Patient Instructions (Signed)
Please stop at the lab.  Please return in 1 year.  

## 2014-11-16 ENCOUNTER — Encounter: Payer: Self-pay | Admitting: Internal Medicine

## 2014-11-16 LAB — PTH, INTACT AND CALCIUM
CALCIUM: 9.5 mg/dL (ref 8.7–10.3)
PTH: 41 pg/mL (ref 15–65)

## 2014-11-18 ENCOUNTER — Telehealth: Payer: Self-pay | Admitting: Gastroenterology

## 2014-11-18 NOTE — Telephone Encounter (Signed)
Patient called back and had another question regarding taking her medication tomorrow.  She wanted to be sure that it was okay to take her HCTZ today as she already had taken it this morning.  I reassured her that it was fine to take it today as prescribed.  All questions were answered.

## 2014-11-18 NOTE — Telephone Encounter (Signed)
Gail Fuentes questioned if it was okay to take Xanax prior to her procedure. Advised patient okay to take Xanax. Also, advised her not to take Hctz the day of her procedure.

## 2014-11-19 ENCOUNTER — Ambulatory Visit (AMBULATORY_SURGERY_CENTER): Payer: Medicare Other | Admitting: Gastroenterology

## 2014-11-19 ENCOUNTER — Encounter: Payer: Self-pay | Admitting: Gastroenterology

## 2014-11-19 VITALS — BP 125/74 | HR 71 | Temp 99.3°F | Resp 29 | Ht 64.0 in | Wt 214.0 lb

## 2014-11-19 DIAGNOSIS — R079 Chest pain, unspecified: Secondary | ICD-10-CM

## 2014-11-19 DIAGNOSIS — K921 Melena: Secondary | ICD-10-CM | POA: Diagnosis not present

## 2014-11-19 DIAGNOSIS — K317 Polyp of stomach and duodenum: Secondary | ICD-10-CM

## 2014-11-19 DIAGNOSIS — R1013 Epigastric pain: Secondary | ICD-10-CM | POA: Diagnosis present

## 2014-11-19 DIAGNOSIS — K219 Gastro-esophageal reflux disease without esophagitis: Secondary | ICD-10-CM

## 2014-11-19 MED ORDER — SODIUM CHLORIDE 0.9 % IV SOLN: 500.0000 mL | INTRAVENOUS | Status: DC

## 2014-11-19 NOTE — Op Note (Signed)
[facility] [address] [place], [address]   ENDOSCOPY PROCEDURE REPORT  PATIENT: Gail Fuentes, Gail Fuentes  MR#: [id number] BIRTHDATE: 1945/06/11 , 68  yrs. old GENDER: female ENDOSCOPIST: Meryl Dare, MD, Clementeen Graham REFERRED BY:  Marga Melnick, M.D. PROCEDURE DATE:  11/19/2014 PROCEDURE:  EGD w/ biopsy ASA CLASS:     Class III INDICATIONS:  chest pain, epigastric pain, and history of esophageal reflux. MEDICATIONS: Monitored anesthesia care, Residual sedation present, and Propofol 100 mg IV TOPICAL ANESTHETIC: none DESCRIPTION OF PROCEDURE: After the risks benefits and alternatives of the procedure were thoroughly explained, informed consent was obtained.  The LB [id number] endoscope was introduced through the mouth and advanced to the second portion of the duodenum , Without limitations.  The instrument was slowly withdrawn as the mucosa was fully examined.    STOMACH: Two sessile polyps ranging between 3-22mm in size were found in the gastric fundus.  Multiple sampling biopsies were performed. The stomach otherwise appeared normal. ESOPHAGUS: The mucosa of the esophagus appeared normal. DUODENUM: The duodenal mucosa showed no abnormalities in the bulb and 2nd part of the duodenum.  Retroflexed views revealed a 4 cm hiatal hernia.     The scope was then withdrawn from the patient and the procedure completed.  COMPLICATIONS: There were no immediate complications.  ENDOSCOPIC IMPRESSION: 1.   Two sessile polyps in the gastric fundus; sampling biopsies performed 2.   4 cm hiatal hernia 3.   The EGD otherwise appeared normal  RECOMMENDATIONS: 1.  Anti-reflux regimen long term 2.  Await pathology results 3.  Continue PPI daily  eSigned:  Meryl Dare, MD, [facility] 11/19/2014 2:21 PM

## 2014-11-19 NOTE — Progress Notes (Signed)
Called to room to assist during endoscopic procedure.  Patient ID and intended procedure confirmed with present staff. Received instructions for my participation in the procedure from the performing physician.  

## 2014-11-19 NOTE — Progress Notes (Signed)
Report to PACU, RN, vss, BBS= Clear.  

## 2014-11-19 NOTE — Patient Instructions (Signed)

## 2014-11-19 NOTE — Op Note (Signed)
[facility] [address] [place], [address]   COLONOSCOPY PROCEDURE REPORT  PATIENT: Gail Fuentes, Gail Fuentes  MR#: [id number] BIRTHDATE: 1945/10/22 , 68  yrs. old GENDER: female ENDOSCOPIST: Meryl Dare, MD, [facility] REFERRED SW:FUXNATF Alwyn Ren, M.D. PROCEDURE DATE:  11/19/2014 PROCEDURE:   Colonoscopy, diagnostic First Screening Colonoscopy - Avg.  risk and is 50 yrs.  old or older - No.  Prior Negative Screening - Now for repeat screening. N/A  History of Adenoma - Now for follow-up colonoscopy & has been > or = to 3 yrs.  N/A  Polyps removed today? No Recommend repeat exam, <10 yrs? No ASA CLASS:   Class III INDICATIONS:Evaluation of unexplained GI bleeding and Colorectal Neoplasm Risk Assessment for this procedure is average risk. MEDICATIONS: Monitored anesthesia care and Propofol 200 mg IV DESCRIPTION OF PROCEDURE:   After the risks benefits and alternatives of the procedure were thoroughly explained, informed consent was obtained.  The digital rectal exam revealed no abnormalities of the rectum.   The LB [id number]  endoscope was introduced through the anus and advanced to the cecum, which was identified by both the appendix and ileocecal valve. No adverse events experienced.   The quality of the prep was excellent. (Suprep was used)  The instrument was then slowly withdrawn as the colon was fully examined. Estimated blood loss is zero unless otherwise noted in this procedure report.    COLON FINDINGS: There was mild diverticulosis noted in the sigmoid colon, transverse colon, and ascending colon.   The examination was otherwise normal.  Retroflexed views revealed internal Grade I hemorrhoids. The time to cecum = 2.1 Withdrawal time = 9.7   The scope was withdrawn and the procedure completed. COMPLICATIONS: There were no immediate complications.  ENDOSCOPIC IMPRESSION: 1.   Mild diverticulosis in the sigmoid colon, transverse colon, and ascending  colon 2.   Grade l internal hemorrhoids  RECOMMENDATIONS: 1.  High fiber diet with liberal fluid intake. 2.  Prep H OTC supp daily as needed 3.  Continue current colorectal screening recommendations for "routine risk" patients with a repeat colonoscopy in 10 years.  eSigned:  Meryl Dare, MD, [facility] 11/19/2014 2:17 PM

## 2014-11-22 ENCOUNTER — Telehealth: Payer: Self-pay

## 2014-11-22 NOTE — Telephone Encounter (Signed)
  Follow up Call-  Call back number 11/19/2014  Post procedure Call Back phone  # [phone]  Permission to leave phone message No     Patient questions:  Do you have a fever, pain , or abdominal swelling? No. Pain Score  0 *  Have you tolerated food without any problems? Yes.    Have you been able to return to your normal activities? Yes.    Do you have any questions about your discharge instructions: Diet   No. Medications  No. Follow up visit  No.  Do you have questions or concerns about your Care? No.  Actions: * If pain score is 4 or above: No action needed, pain <4.  No problems noted per the pt. maw

## 2014-11-25 ENCOUNTER — Encounter: Payer: Self-pay | Admitting: Gastroenterology

## 2014-12-29 ENCOUNTER — Other Ambulatory Visit: Payer: Self-pay | Admitting: Internal Medicine

## 2014-12-30 ENCOUNTER — Other Ambulatory Visit: Payer: Self-pay

## 2014-12-30 ENCOUNTER — Other Ambulatory Visit (INDEPENDENT_AMBULATORY_CARE_PROVIDER_SITE_OTHER): Payer: Medicare Other

## 2014-12-30 ENCOUNTER — Ambulatory Visit (INDEPENDENT_AMBULATORY_CARE_PROVIDER_SITE_OTHER): Payer: Medicare Other | Admitting: Internal Medicine

## 2014-12-30 ENCOUNTER — Encounter: Payer: Self-pay | Admitting: Internal Medicine

## 2014-12-30 VITALS — BP 130/82 | HR 59 | Temp 98.0°F | Resp 18 | Wt 219.0 lb

## 2014-12-30 DIAGNOSIS — M542 Cervicalgia: Secondary | ICD-10-CM | POA: Diagnosis not present

## 2014-12-30 DIAGNOSIS — M25519 Pain in unspecified shoulder: Secondary | ICD-10-CM | POA: Diagnosis not present

## 2014-12-30 DIAGNOSIS — M25511 Pain in right shoulder: Secondary | ICD-10-CM

## 2014-12-30 DIAGNOSIS — M25512 Pain in left shoulder: Secondary | ICD-10-CM

## 2014-12-30 LAB — SEDIMENTATION RATE: Sed Rate: 29 mm/hr — ABNORMAL HIGH (ref 0–22)

## 2014-12-30 MED ORDER — TIZANIDINE HCL 4 MG PO CAPS: 4.0000 mg | ORAL_CAPSULE | Freq: Three times a day (TID) | ORAL | Status: DC

## 2014-12-30 MED ORDER — LOSARTAN POTASSIUM 50 MG PO TABS: 50.0000 mg | ORAL_TABLET | Freq: Every day | ORAL | Status: DC

## 2014-12-30 MED ORDER — PREDNISONE 10 MG PO TABS: 10.0000 mg | ORAL_TABLET | Freq: Two times a day (BID) | ORAL | Status: DC

## 2014-12-30 NOTE — Progress Notes (Signed)
   Subjective:    Patient ID: Gail Fuentes, female    DOB: 07/11/1945, 69 y.o.   MRN: [id number]  HPI She began to have dull pain @ the base of the posterior skull the evening she had a colonoscopy. She's had intermittent discomfort in this area since. She questions some swelling of the right lateral neck muscles as well as the right retroauricular area. She's continued to have constant discomfort to some extent with associated stiff neck  & pain in the shoulders. Aleve or Advil help. The pain can be sharp at times.  Occasionally when supine she'll feel as if she has "at my electricity" in the upper back and neck. She does has a history of bulging cervical disc.She had previously employed a "buckwheat pillow" with benefit for her cervical disc issues.  Unrelated is occasional sciatic discomfort in the right lower extremity which is not presently inactive.  Also unrelated is occasional tearing of the right eye.  She has some intermittent tingling in her hands and forearms which predated the symptoms.  She has some maxillary sinus pressure but no rhinosinusitis symptoms.     Review of Systems Fever, chills, sweats, or unexplained weight loss not present.  Mental status change or memory loss denied. Blurred vision , diplopia or vision loss absent. Vertigo, near syncope or imbalance denied.  No loss of control of bladder or bowels. Radicular type pain absent. No seizure stigmata.    Objective:   Physical Exam  Pertinent or positive findings include: She has a grade 1 systolic murmur. There is accentuated curvature of upper thoracic spine. Abdomen is protuberant. She has crepitus in her knees. Neurologic exam : Cn 2-7 intact Strength equal & normal in upper & lower extremities Able to walk on heels and toes.   Balance normal  Romberg normal, finger to nose normal. Deep tendon reflexes are normal.  General appearance :adequately nourished; in no distress.  Eyes: No  conjunctival inflammation or scleral icterus is present.EOM & FOV WNL.  Neck: post operative SQ changes  Oral exam:  Lips and gums are healthy appearing.There is no oropharyngeal erythema or exudate noted. Dental hygiene is good.  Heart:  Normal rate and regular rhythm. S1 and S2 normal without gallop, click, rub or other extra sounds    Lungs:Chest clear to auscultation; no wheezes, rhonchi,rales ,or rubs present.No increased work of breathing.   Abdomen: bowel sounds normal, soft and non-tender without masses, organomegaly or hernias noted.  No guarding or rebound. No flank tenderness to percussion.  Vascular : all pulses equal ; no bruits present.  Skin:Warm & dry.  Intact without suspicious lesions or rashes ; no tenting or jaundice   Lymphatic: No lymphadenopathy is noted about the head, neck, axilla      Assessment & Plan:  #1 pain in the neck and shoulders; rule out polymyalgia rheumatica  #2 history of bulging cervical disc  Plan: See orders recommendations

## 2014-12-30 NOTE — Progress Notes (Signed)
Pre visit review using our clinic review tool, if applicable. No additional management support is needed unless otherwise documented below in the visit note. 

## 2014-12-30 NOTE — Patient Instructions (Signed)
Use an anti-inflammatory cream such as Aspercreme or Zostrix cream twice a day to the affected area as needed. In lieu of this warm moist compresses or  hot water bottle can be used. Do not apply ice . Use a cervical memory foam pillow to prevent hyperextension or hyperflexion of the cervical spine. 

## 2015-04-18 ENCOUNTER — Other Ambulatory Visit: Payer: Self-pay | Admitting: Internal Medicine

## 2015-06-10 ENCOUNTER — Telehealth: Payer: Self-pay | Admitting: Internal Medicine

## 2015-06-10 NOTE — Telephone Encounter (Signed)
Pt has been seeing Dr. Alwyn Ren and a few times in the past saw Dr. Drue Novel for acute care. Pt would like to establish with Dr. Drue Novel moving forward. Please advise.

## 2015-06-10 NOTE — Telephone Encounter (Signed)
Notified pt that Dr. Drue Novel cannot accept as pt currently - offered appt with Dr. Abbe Amsterdam - pt will think about it and call back.

## 2015-06-10 NOTE — Telephone Encounter (Signed)
Unable to take new patients at this point. We'll have two new providers at this office in the next few weeks.

## 2015-06-27 ENCOUNTER — Other Ambulatory Visit: Payer: Self-pay | Admitting: Internal Medicine

## 2015-06-27 NOTE — Telephone Encounter (Signed)
Patient wants to establish with dr copland/new pcp at [facility]---i have skyped team lead, Ashlee and requested her to help with getting this message to new pcp, dr copland----[facility] will call patient back schedule appt and also advise on refill request for Prilosec----this information is in taylor mitchells basket until it is routed to appropriate person

## 2015-07-15 ENCOUNTER — Other Ambulatory Visit: Payer: Self-pay | Admitting: Internal Medicine

## 2015-07-15 ENCOUNTER — Telehealth: Payer: Self-pay | Admitting: Behavioral Health

## 2015-07-15 NOTE — Telephone Encounter (Signed)
Unable to reach patient at time of Pre-Visit Call.  Left message for patient to return call when available.    

## 2015-07-18 ENCOUNTER — Encounter: Payer: Self-pay | Admitting: Family Medicine

## 2015-07-18 ENCOUNTER — Ambulatory Visit (INDEPENDENT_AMBULATORY_CARE_PROVIDER_SITE_OTHER): Payer: Medicare Other | Admitting: Family Medicine

## 2015-07-18 VITALS — BP 138/84 | HR 63 | Temp 98.2°F | Ht 64.5 in | Wt 215.2 lb

## 2015-07-18 DIAGNOSIS — E213 Hyperparathyroidism, unspecified: Secondary | ICD-10-CM | POA: Diagnosis not present

## 2015-07-18 DIAGNOSIS — K219 Gastro-esophageal reflux disease without esophagitis: Secondary | ICD-10-CM

## 2015-07-18 DIAGNOSIS — E890 Postprocedural hypothyroidism: Secondary | ICD-10-CM | POA: Diagnosis not present

## 2015-07-18 DIAGNOSIS — Z23 Encounter for immunization: Secondary | ICD-10-CM | POA: Diagnosis not present

## 2015-07-18 DIAGNOSIS — F32A Depression, unspecified: Secondary | ICD-10-CM

## 2015-07-18 DIAGNOSIS — F329 Major depressive disorder, single episode, unspecified: Secondary | ICD-10-CM | POA: Diagnosis not present

## 2015-07-18 DIAGNOSIS — J069 Acute upper respiratory infection, unspecified: Secondary | ICD-10-CM | POA: Diagnosis not present

## 2015-07-18 DIAGNOSIS — Z119 Encounter for screening for infectious and parasitic diseases, unspecified: Secondary | ICD-10-CM

## 2015-07-18 DIAGNOSIS — Z1321 Encounter for screening for nutritional disorder: Secondary | ICD-10-CM

## 2015-07-18 DIAGNOSIS — R112 Nausea with vomiting, unspecified: Secondary | ICD-10-CM

## 2015-07-18 LAB — VITAMIN D 25 HYDROXY (VIT D DEFICIENCY, FRACTURES): VITD: 34.16 ng/mL (ref 30.00–100.00)

## 2015-07-18 LAB — COMPREHENSIVE METABOLIC PANEL
ALBUMIN: 4.2 g/dL (ref 3.5–5.2)
ALT: 20 U/L (ref 0–35)
AST: 25 U/L (ref 0–37)
Alkaline Phosphatase: 59 U/L (ref 39–117)
BUN: 10 mg/dL (ref 6–23)
CHLORIDE: 102 meq/L (ref 96–112)
CO2: 26 meq/L (ref 19–32)
CREATININE: 0.91 mg/dL (ref 0.40–1.20)
Calcium: 9.5 mg/dL (ref 8.4–10.5)
GFR: 65.25 mL/min (ref >60.00)
Glucose, Bld: 96 mg/dL (ref 70–99)
Potassium: 4.1 mEq/L (ref 3.5–5.1)
SODIUM: 137 meq/L (ref 135–145)
Total Bilirubin: 0.4 mg/dL (ref 0.2–1.2)
Total Protein: 7.3 g/dL (ref 6.0–8.3)

## 2015-07-18 LAB — TSH: TSH: 1.84 u[IU]/mL (ref 0.35–4.50)

## 2015-07-18 LAB — LIPASE: LIPASE: 34 U/L (ref 11.0–59.0)

## 2015-07-18 MED ORDER — OMEPRAZOLE 20 MG PO CPDR: 20.0000 mg | DELAYED_RELEASE_CAPSULE | Freq: Every day | ORAL | Status: DC

## 2015-07-18 MED ORDER — FLUTICASONE PROPIONATE 50 MCG/ACT NA SUSP: 1.0000 | Freq: Two times a day (BID) | NASAL | Status: DC | PRN

## 2015-07-18 NOTE — Progress Notes (Signed)
[facility] [address] [place], [place] [address] [phone]  Date:  07/18/2015   Name:  Gail Fuentes   DOB:  1946/03/12   MRN:  [id number]  PCP:  Abbe Amsterdam, MD    Chief Complaint: New Patient (Initial Visit)   History of Present Illness:  Gail Fuentes is a 70 y.o. very pleasant female patient who presents with the following:  Here today to establish care with myself as her PCP has retired.   She is on synthroid 100, premarin, prilosec, cozaar/ HCTZ for her BP, prozac, xanax  Lab Results  Component Value Date   TSH 1.27 11/15/2014   She notes that she has "not felt well" for a few months, she has noted a non- specific sense of dissatisfaction with her current life situation.  Denies any SI  She did have surgery for hyperparathyroidism in 02/2013. She notes aching in her neck, shoulders and upper back.  This "soreness" will bother her every few days.  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  She is trying to settle the estate and this is really hard, also she helps with her grandchildren, her son and DIL are getting divorced.  Overall she notes significant life stressors.  "I guess I'm just a worrier," she feels that her stressors do contribute to her physical [place]  She sees DR. Gherghe for her endocrinology concerns. 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 She would like to get back to the gym but just has not done so as of yet.   On hormone replacement since Oct 03, 2003 since her hysterectomy.  She ran out of her premarin about 2 weeks ago.  She does not think that she feels any different without this medication.  She will see DR. Jennette Kettle pretty soon for a follow-up and will discuss  Admits that she  feels somewhat isolated- she does not have any close female friends.  She does live with her husband.  Their relationship is ok but she does still feel like something is missing in her life.  She would like to get involved with a church again  Patient Active Problem List   Diagnosis Date Noted  . History of hyperparathyroidism 08/24/2014  . Obesity (BMI 30-39.9) 10/08/2013  . HTN (hypertension) 07/13/2013  . Elevated blood pressure reading without diagnosis of hypertension 06/29/2013  . Osteopenia 11/26/2012  . Acute iritis of both eyes 10/17/2012  . Thyroid nodule 10/17/2012  . Cardiomegaly 02/20/2012  . DISTURBANCES OF SENSATION OF SMELL AND TASTE 04/19/2010  . Vitamin D deficiency 12/24/2008  . ANXIETY 12/24/2008  . DEPRESSION 12/24/2008  . BENIGN PAROXYSMAL POSITIONAL VERTIGO 12/24/2008  . HYPERGLYCEMIA 12/24/2008  . Nonspecific abnormal electrocardiogram (ECG) (EKG) 12/24/2008  . Hyperlipidemia 07/23/2007  . Hypothyroidism 07/22/2007  . GERD 07/22/2007  . DIVERTICULITIS, HX OF 07/22/2007    Past Medical History  Diagnosis Date  . Hyperlipidemia   . Vitamin D deficiency   . Benign paroxysmal positional vertigo   . Other abnormal glucose   . Depressive disorder, not elsewhere classified   . Unspecified hypothyroidism   . Esophageal reflux   . Personal history of other diseases of digestive system  12-15 since 1990, no recurrence 2006  . Allergy     sneezing  late Fall  . Anemia 01/23/12    H/H 11.7/35  . Abnormal EKG     per patient- states evaluated by Dr Daleen Squibb in the past  . Anxiety state, unspecified   . PONV (postoperative nausea and vomiting)   . Arthritis   . History of hyperparathyroidism   . Esophageal stricture   . Hiatal hernia   . Diverticulosis   . Depression   . Gallstones     Past Surgical History  Procedure Laterality Date  . Dental surgery      Age 68-Abscessed Teeth Extracted   . Cesarean section  1977  . Colonoscopy  12/2003    Tics  .  Total abdominal hysterectomy w/ bilateral salpingoophorectomy      Ovarian Cyst  . Cholecystectomy    . Parathyroidectomy Left 02/13/2013    Procedure: LEFT INFERIOR PARATHYROIDECTOMY with frozen section ;  Surgeon: Velora Heckler, MD;  Location: [facility];  Service: General;  Laterality: Left;    Social History  Substance Use Topics  . Smoking status: Former Smoker    Quit date: 05/07/1973  . Smokeless tobacco: Never Used     Comment: Smoked 1968-1975 up to 2 ppd  . Alcohol Use: 8.4 oz/week    14 Standard drinks or equivalent per week     Comment: 2 nightly    Family History  Problem Relation Age of Onset  . Coronary artery disease Father   . Heart attack Father 34  . COPD Father   . Diabetes Mother   . Dysrhythmia Mother   . Depression Mother   . COPD Mother   . Prostate cancer Brother   . Other Brother     Wegener's in sinus; [facility]  . Hypertension Brother   . Prostate cancer Brother   . Cancer Brother     Soft tissue in the face/temple  . Stroke Maternal Aunt     >65  . Stroke Maternal Grandmother     >65  . Stroke Maternal Grandfather 17  . Colon polyps Mother   . Kidney disease Mother     No Known Allergies  Medication list has been reviewed and updated.  Current Outpatient Prescriptions on File Prior to Visit  Medication Sig Dispense Refill  . ALPRAZolam (XANAX) 0.5 MG tablet Take 0.5 mg by mouth 3 (three) times daily as needed for anxiety.     . Cholecalciferol (VITAMIN D3) 2000 UNITS TABS Take 1 tablet by mouth daily.    [name] FLUoxetine (PROZAC) 40 MG capsule Take 40 mg by mouth every morning.     . fluticasone (FLONASE) 50 MCG/ACT nasal spray Place 1 spray into the nose 2 (two) times daily as needed for rhinitis. (Patient taking differently: Place 1 spray into the nose as needed for rhinitis. ) 16 g 2  . hydrochlorothiazide (MICROZIDE) 12.5 MG capsule Take 1 capsule (12.5 mg total) by mouth daily. 90 capsule 3  . losartan (COZAAR) 50 MG tablet Take 1 tablet (50  mg total) by mouth daily. 90 tablet 2  . omeprazole (PRILOSEC) 20 MG capsule TAKE 1 CAPSULE DAILY 90 capsule 1  . PREMARIN 0.3 MG tablet Take 0.3 mg by mouth daily.     [name] SYNTHROID 100 MCG tablet TAKE ONE TABLET DAILY AND TAKE ONE-HALF (1/2) TABLET ON SUNDAY 90 tablet 1  . fexofenadine (ALLEGRA) 180 MG tablet Take 180 mg by mouth as needed. Reported on 07/18/2015    .  tiZANidine (ZANAFLEX) 4 MG capsule Take 1 capsule (4 mg total) by mouth 3 (three) times daily. (Patient not taking: Reported on 07/18/2015) 10 capsule 0   No current facility-administered medications on file prior to visit.    Review of Systems:  As per HPI- otherwise negative.   Physical Examination: Filed Vitals:   07/18/15 1046  BP: 138/84  Pulse: 63  Temp: 98.2 F (36.8 C)   Filed Vitals:   07/18/15 1046  Height: 5' 4.5" (1.638 m)  Weight: 215 lb 3.2 oz (97.614 kg)   Body mass index is 36.38 kg/(m^2). Ideal Body Weight: Weight in (lb) to have BMI = 25: 147.6  GEN: WDWN, NAD, Non-toxic, A & O x 3, overweight, looks well HEENT: Atraumatic, Normocephalic. Neck supple. No masses, No LAD.    Bilateral TM wnl, oropharynx normal.  PEERL,EOMI.  Well- healed scar on anterior neck Ears and Nose: No external deformity. CV: RRR, No M/G/R. No JVD. No thrill. No extra heart sounds. PULM: CTA B, no wheezes, crackles, rhonchi. No retractions. No resp. distress. No accessory muscle use. EXTR: No c/c/e NEURO Normal gait.  PSYCH: Normally interactive. Conversant. Not depressed or anxious appearing.  Calm demeanor.    Assessment and Plan: Hyperparathyroidism (HCC) - Plan: Comprehensive metabolic panel, TSH  Depression  Postoperative hypothyroidism - Plan: TSH  URI (upper respiratory infection) - Plan: fluticasone (FLONASE) 50 MCG/ACT nasal spray  Immunization due - Plan: Td vaccine greater than or equal to 7yo preservative free IM, Pneumococcal conjugate vaccine 13-valent IM  Gastroesophageal reflux disease,  esophagitis presence not specified - Plan: omeprazole (PRILOSEC) 20 MG capsule  Encounter for vitamin deficiency screening - Plan: Vitamin D (25 hydroxy)  Here today to establish care She has dx most likely due to depression or adjustment disorder. Will eval for any physical cause with labs as above Encouraged her to take the steps of rejoining a gym and also to look for a church or other women's group. She plans to do so and will keep me posted as to her progress. Continue current meds but if she does not improve may need to adjust these  Will plan further follow- up pending labs.  Signed Abbe Amsterdam, MD

## 2015-07-18 NOTE — Patient Instructions (Addendum)
It was very nice to see you today!  We will check some labs for you today and I will send your labs to Dr. Elvera Lennox Please do look into getting back into exercise.  You might enjoy trying zumba, and get back into lifting weights Take a break from the news Please set 2 goals over the next 6 weeks- get back into exercise (join a gym) and find a social group of some type, church may be a good idea  Call and ask about the zostavax vaccine for shingles We gave you your second pneumonia vaccine and tetanus shot today  You do not have to take the omeprazole every day- you can use it just as needed

## 2015-07-19 LAB — HEPATITIS C ANTIBODY: HCV AB: NEGATIVE

## 2015-09-28 ENCOUNTER — Telehealth: Payer: Self-pay

## 2015-09-28 NOTE — Telephone Encounter (Signed)
Patient is on the List for [employer] 2017. Patient may be a good candidate for an AWV.

## 2015-09-28 NOTE — Telephone Encounter (Signed)
LM for pt to schedule AWV with RN (due after 10/12/15)

## 2015-10-10 ENCOUNTER — Ambulatory Visit: Payer: Self-pay | Admitting: Family Medicine

## 2015-10-18 ENCOUNTER — Telehealth: Payer: Self-pay | Admitting: Family Medicine

## 2015-10-18 DIAGNOSIS — I10 Essential (primary) hypertension: Secondary | ICD-10-CM

## 2015-10-18 MED ORDER — HYDROCHLOROTHIAZIDE 12.5 MG PO CAPS: 12.5000 mg | ORAL_CAPSULE | Freq: Every day | ORAL | Status: DC

## 2015-10-18 MED ORDER — LOSARTAN POTASSIUM 50 MG PO TABS: 50.0000 mg | ORAL_TABLET | Freq: Every day | ORAL | Status: DC

## 2015-10-18 NOTE — Telephone Encounter (Signed)
Relationship to patient: self Can be reached: [phone]  Pharmacy: [id number] - [place], [place] - [address]  Reason for call: pt called for refill on losartan and hydrochlorothiazide. Has 11 days losartan and 3 days hydrochlorothiazide. Pt requesting 90 day supply, cpe scheduled for 7/13 with Dr. Patsy Lager.

## 2015-10-18 NOTE — Telephone Encounter (Signed)
Please advise if ok to send in a refill on the medications and I will fill them until she comes in for a CPE.

## 2015-10-19 MED ORDER — HYDROCHLOROTHIAZIDE 12.5 MG PO CAPS: 12.5000 mg | ORAL_CAPSULE | Freq: Every day | ORAL | Status: DC

## 2015-10-19 MED ORDER — LOSARTAN POTASSIUM 50 MG PO TABS: 50.0000 mg | ORAL_TABLET | Freq: Every day | ORAL | Status: DC

## 2015-10-19 NOTE — Addendum Note (Signed)
Addended by: Abbe Amsterdam C on: 10/19/2015 08:53 PM   Modules accepted: Orders

## 2015-10-25 ENCOUNTER — Encounter: Payer: Self-pay | Admitting: Physician Assistant

## 2015-10-25 ENCOUNTER — Ambulatory Visit (INDEPENDENT_AMBULATORY_CARE_PROVIDER_SITE_OTHER): Payer: Medicare Other | Admitting: Physician Assistant

## 2015-10-25 VITALS — BP 124/82 | HR 70 | Temp 98.1°F | Resp 16 | Ht 64.5 in | Wt 216.0 lb

## 2015-10-25 DIAGNOSIS — H6983 Other specified disorders of Eustachian tube, bilateral: Secondary | ICD-10-CM | POA: Diagnosis not present

## 2015-10-25 DIAGNOSIS — J208 Acute bronchitis due to other specified organisms: Principal | ICD-10-CM

## 2015-10-25 DIAGNOSIS — J00 Acute nasopharyngitis [common cold]: Secondary | ICD-10-CM

## 2015-10-25 DIAGNOSIS — B9689 Other specified bacterial agents as the cause of diseases classified elsewhere: Secondary | ICD-10-CM

## 2015-10-25 MED ORDER — ALBUTEROL SULFATE HFA 108 (90 BASE) MCG/ACT IN AERS: 2.0000 | INHALATION_SPRAY | Freq: Four times a day (QID) | RESPIRATORY_TRACT | Status: DC | PRN

## 2015-10-25 MED ORDER — AZITHROMYCIN 250 MG PO TABS: ORAL_TABLET | ORAL | Status: DC

## 2015-10-25 NOTE — Patient Instructions (Signed)
Take antibiotic (Azithromycin) as directed.  Increase fluids.  Get plenty of rest. Use Mucinex for congestion. Restart your Allegra and Flonase taking daily. Take a daily probiotic (I recommend Align or Culturelle, but even Activia Yogurt may be beneficial).  A humidifier placed in the bedroom may offer some relief for a dry, scratchy throat of nasal irritation.  Read information below on acute bronchitis. Please call or return to clinic if symptoms are not improving.  Acute Bronchitis Bronchitis is when the airways that extend from the windpipe into the lungs get red, puffy, and painful (inflamed). Bronchitis often causes thick spit (mucus) to develop. This leads to a cough. A cough is the most common symptom of bronchitis. In acute bronchitis, the condition usually begins suddenly and goes away over time (usually in 2 weeks). Smoking, allergies, and asthma can make bronchitis worse. Repeated episodes of bronchitis may cause more lung problems.  HOME CARE 1. Rest. 2. Drink enough fluids to keep your pee (urine) clear or pale yellow (unless you need to limit fluids as told by your doctor). 3. Only take over-the-counter or prescription medicines as told by your doctor. 4. Avoid smoking and secondhand smoke. These can make bronchitis worse. If you are a smoker, think about using nicotine gum or skin patches. Quitting smoking will help your lungs heal faster. 5. Reduce the chance of getting bronchitis again by: 1. Washing your hands often. 2. Avoiding people with cold symptoms. 3. Trying not to touch your hands to your mouth, nose, or eyes. 6. Follow up with your doctor as told.  GET HELP IF: Your symptoms do not improve after 1 week of treatment. Symptoms include:  Cough.  Fever.  Coughing up thick spit.  Body aches.  Chest congestion.  Chills.  Shortness of breath.  Sore throat.  GET HELP RIGHT AWAY IF:   You have an increased fever.  You have chills.  You have severe  shortness of breath.  You have bloody thick spit (sputum).  You throw up (vomit) often.  You lose too much body fluid (dehydration).  You have a severe headache.  You faint.  MAKE SURE YOU:   Understand these instructions.  Will watch your condition.  Will get help right away if you are not doing well or get worse. Document Released: 10/10/2007 Document Revised: 12/24/2012 Document Reviewed: 10/14/2012 [facility] Patient Information 2015 [place], [place]. This information is not intended to replace advice given to you by your health care provider. Make sure you discuss any questions you have with your health care provider.   Metered Dose Inhaler (No Spacer Used) Inhaled medicines are the basis of treatment for asthma and other breathing problems. Inhaled medicine can only be effective if used properly. Good technique assures that the medicine reaches the lungs. Metered dose inhalers (MDIs) are used to deliver a variety of inhaled medicines. These include quick relief or rescue medicines (such as bronchodilators) and controller medicines (such as corticosteroids). The medicine is delivered by pushing down on a metal canister to release a set amount of spray. If you are using different kinds of inhalers, use your quick relief medicine to open the airways 10-15 minutes before using a steroid, if instructed to do so by your health care provider. If you are unsure which inhalers to use and the order of using them, ask your health care provider, nurse, or respiratory therapist. HOW TO USE THE INHALER 7. Remove the cap from the inhaler. 8. If you are using the inhaler for the  first time, you will need to prime it. Shake the inhaler for 5 seconds and release four puffs into the air, away from your face. Ask your health care provider or pharmacist if you have questions about priming your inhaler. 9. Shake the inhaler for 5 seconds before each breath in (inhalation). 10. Position the inhaler so  that the top of the canister faces up. 11. Put your index finger on the top of the medicine canister. Your thumb supports the bottom of the inhaler. 12. Open your mouth. 13. Either place the inhaler between your teeth and place your lips tightly around the mouthpiece, or hold the inhaler 1-2 inches away from your open mouth. If you are unsure of which technique to use, ask your health care provider. 14. Breathe out (exhale) normally and as completely as possible. 15. Press the canister down with the index finger to release the medicine. 16. At the same time as the canister is pressed, inhale deeply and slowly until your lungs are completely filled. This should take 4-6 seconds. Keep your tongue down. 17. Hold the medicine in your lungs for 5-10 seconds (10 seconds is best). This helps the medicine get into the small airways of your lungs. 18. Breathe out slowly, through pursed lips. Whistling is an example of pursed lips. 19. Wait at least 1 minute between puffs. Continue with the above steps until you have taken the number of puffs your health care provider has ordered. Do not use the inhaler more than your health care provider directs you to. 20. Replace the cap on the inhaler. 21. Follow the directions from your health care provider or the inhaler insert for cleaning the inhaler. If you are using a steroid inhaler, after your last puff, rinse your mouth with water, gargle, and spit out the water. Do not swallow the water. AVOID:  Inhaling before or after starting the spray of medicine. It takes practice to coordinate your breathing with triggering the spray.  Inhaling through the nose (rather than the mouth) when triggering the spray. HOW TO DETERMINE IF YOUR INHALER IS FULL OR NEARLY EMPTY You cannot know when an inhaler is empty by shaking it. Some inhalers are now being made with dose counters. Ask your health care provider for a prescription that has a dose counter if you feel you need that  extra help. If your inhaler does not have a counter, ask your health care provider to help you determine the date you need to refill your inhaler. Write the refill date on a calendar or your inhaler canister. Refill your inhaler 7-10 days before it runs out. Be sure to keep an adequate supply of medicine. This includes making sure it has not expired, and making sure you have a spare inhaler. SEEK MEDICAL CARE IF:  Symptoms are only partially relieved with your inhaler.  You are having trouble using your inhaler.  You experience an increase in phlegm. SEEK IMMEDIATE MEDICAL CARE IF:  You feel little or no relief with your inhalers. You are still wheezing and feeling shortness of breath, tightness in your chest, or both.  You have dizziness, headaches, or a fast heart rate.  You have chills, fever, or night sweats.  There is a noticeable increase in phlegm production, or there is blood in the phlegm. MAKE SURE YOU:  Understand these instructions.  Will watch your condition.  Will get help right away if you are not doing well or get worse.   This information is not intended to  replace advice given to you by your health care provider. Make sure you discuss any questions you have with your health care provider.   Document Released: 02/18/2007 Document Revised: 05/14/2014 Document Reviewed: 10/09/2012 Elsevier Interactive Patient Education [employer]

## 2015-10-25 NOTE — Progress Notes (Signed)
  Subjective:     Gail Fuentes is a 70 y.o. female who presents for evaluation of symptoms of a URI. Symptoms include bilateral ear pressure/pain, congestion, no  fever, post nasal drip, productive cough with  yellow colored sputum, sinus pressure and wheezing. Onset of symptoms was a few weeks ago, and has been gradually worsening over the past week. Treatment to date: cough suppressants.  The following portions of the patient's history were reviewed and updated as appropriate: allergies, current medications, past family history, past medical history, past social history, past surgical history and problem list.  Review of Systems Pertinent items are noted in HPI.   Objective:    BP 124/82 mmHg  Pulse 70  Temp(Src) 98.1 F (36.7 C) (Oral)  Resp 16  Ht 5' 4.5" (1.638 m)  Wt 216 lb (97.977 kg)  BMI 36.52 kg/m2  SpO2 98%   General appearance: alert, cooperative, appears stated age and no distress Head: Normocephalic, without obvious abnormality, atraumatic Ears: mild clear fluid behind TM bilaterally. No erythema or bulging Nose: Nares normal. Septum midline. Mucosa normal. No drainage or sinus tenderness. Throat: lips, mucosa, and tongue normal; teeth and gums normal Lungs: clear to auscultation bilaterally Heart: regular rate and rhythm, S1, S2 normal, no murmur, click, rub or gallop Lymph nodes: Cervical, supraclavicular, and axillary nodes normal.   Assessment:    bronchitis and eustachian tube dysfunction   Plan:    Discussed diagnosis and treatment of URI. Suggested symptomatic OTC remedies. Nasal saline spray for congestion. Zithromax per orders. Nasal steroids per orders. Continue Allegra, Supportive measures reviewed. FU PRN

## 2015-11-09 ENCOUNTER — Encounter: Payer: Self-pay | Admitting: Medical

## 2015-11-09 ENCOUNTER — Ambulatory Visit (INDEPENDENT_AMBULATORY_CARE_PROVIDER_SITE_OTHER): Payer: Medicare Other | Admitting: Medical

## 2015-11-09 ENCOUNTER — Ambulatory Visit (HOSPITAL_BASED_OUTPATIENT_CLINIC_OR_DEPARTMENT_OTHER)
Admission: RE | Admit: 2015-11-09 | Discharge: 2015-11-09 | Disposition: A | Discharge status: Home / Self Care | Payer: Medicare Other | Source: Ambulatory Visit | Attending: Medical | Admitting: Medical

## 2015-11-09 VITALS — BP 120/80 | HR 76 | Temp 98.1°F | Ht 64.5 in | Wt 217.6 lb

## 2015-11-09 DIAGNOSIS — R05 Cough: Secondary | ICD-10-CM | POA: Insufficient documentation

## 2015-11-09 DIAGNOSIS — T17308A Unspecified foreign body in larynx causing other injury, initial encounter: Secondary | ICD-10-CM | POA: Diagnosis not present

## 2015-11-09 DIAGNOSIS — R059 Cough, unspecified: Secondary | ICD-10-CM

## 2015-11-09 DIAGNOSIS — J209 Acute bronchitis, unspecified: Secondary | ICD-10-CM

## 2015-11-09 DIAGNOSIS — I517 Cardiomegaly: Secondary | ICD-10-CM | POA: Diagnosis not present

## 2015-11-09 DIAGNOSIS — R062 Wheezing: Secondary | ICD-10-CM

## 2015-11-09 DIAGNOSIS — N951 Menopausal and female climacteric states: Secondary | ICD-10-CM | POA: Diagnosis not present

## 2015-11-09 DIAGNOSIS — R61 Generalized hyperhidrosis: Secondary | ICD-10-CM | POA: Diagnosis not present

## 2015-11-09 DIAGNOSIS — R232 Flushing: Secondary | ICD-10-CM

## 2015-11-09 MED ORDER — DOXYCYCLINE HYCLATE 100 MG PO TABS: 100.0000 mg | ORAL_TABLET | Freq: Two times a day (BID) | ORAL | Status: DC

## 2015-11-09 MED ORDER — FLUTICASONE PROPIONATE HFA 110 MCG/ACT IN AERO: 2.0000 | INHALATION_SPRAY | Freq: Two times a day (BID) | RESPIRATORY_TRACT | Status: DC

## 2015-11-09 MED ORDER — BENZONATATE 100 MG PO CAPS: 100.0000 mg | ORAL_CAPSULE | Freq: Three times a day (TID) | ORAL | Status: DC | PRN

## 2015-11-09 NOTE — Progress Notes (Signed)
Pre visit review using our clinic review tool, if applicable. No additional management support is needed unless otherwise documented below in the visit note. 

## 2015-11-09 NOTE — Progress Notes (Signed)
Subjective:    Patient ID: Gail Fuentes, female    DOB: [birth year], 70 y.o.   MRN: [id number]  HPI  Pt in for evaluation of cough present on and off since last visit. Pt on 10-25-2015 treated by bronchitis. She was treated with azithromycin(overall feels a lot better compared to prior illness). Pt states yesterday she choked on food. Describes prolonged choke period. She eventually coughed up pieces of cantalope.   Pt used to smoke for 4-5 years in 20's.But as child had a lot of smoke exposure since both parents where heavy smokers.   Pt has tendency to have bronchitis through out her life. About 1-2 times a year.  Pt states occasionally wheezes      Review of Systems  Constitutional: Negative for fever, chills and fatigue.  HENT: Negative for congestion, ear pain, hearing loss, mouth sores, postnasal drip, rhinorrhea and sinus pressure.   Respiratory: Positive for cough and wheezing. Negative for choking, chest tightness and shortness of breath.   Cardiovascular: Negative for chest pain and palpitations.  Gastrointestinal: Negative for abdominal pain.  Musculoskeletal: Negative for myalgias and back pain.  Neurological: Negative for dizziness and headaches.  Hematological: Negative for adenopathy. Does not bruise/bleed easily.  Psychiatric/Behavioral: Negative for behavioral problems and confusion.     Past Medical History  Diagnosis Date  . Hyperlipidemia   . Vitamin D deficiency   . Benign paroxysmal positional vertigo   . Other abnormal glucose   . Depressive disorder, not elsewhere classified   . Unspecified hypothyroidism   . Esophageal reflux   . Personal history of other diseases of digestive system     12-15 since 1990, no recurrence 2006  . Allergy     sneezing  late Fall  . Anemia 01/23/12    H/H 11.7/35  . Abnormal EKG     per patient- states evaluated by Dr Daleen Squibb in the past  . Anxiety state, unspecified   . PONV (postoperative nausea and vomiting)     . Arthritis   . History of hyperparathyroidism   . Esophageal stricture   . Hiatal hernia   . Diverticulosis   . Depression   . Gallstones   . Heart murmur   . History of UTI      Social History   Social History  . Marital Status: Married    Spouse Name: N/A  . Number of Children: 1  . Years of Education: N/A   Occupational History  . cleans offices and homes   . retired Visual merchandiser    Social History Main Topics  . Smoking status: Former Smoker    Quit date: 05/07/1973  . Smokeless tobacco: Never Used     Comment: Smoked 1968-1975 up to 2 ppd  . Alcohol Use: 8.4 oz/week    14 Standard drinks or equivalent per week     Comment: 2 nightly  . Drug Use: No  . Sexual Activity: Not on file   Other Topics Concern  . Not on file   Social History Narrative   Regular exercise: no   Caffeine use: daily    Past Surgical History  Procedure Laterality Date  . Dental surgery  1962, 2012    Age 38-Abscessed Teeth Extracted   . Cesarean section  1977  . Colonoscopy  12/2003    Tics  . Total abdominal hysterectomy w/ bilateral salpingoophorectomy  2005    Ovarian Cyst  . Cholecystectomy  2004  . Parathyroidectomy Left 02/13/2013  Procedure: LEFT INFERIOR PARATHYROIDECTOMY with frozen section ;  Surgeon: Velora Heckler, MD;  Location: [facility];  Service: General;  Laterality: Left;    Family History  Problem Relation Age of Onset  . Coronary artery disease Father   . Heart attack Father 54  . COPD Father   . Diabetes Mother   . Dysrhythmia Mother   . Depression Mother   . COPD Mother   . Prostate cancer Brother   . Other Brother     Wegener's in sinus; [facility]  . Hypertension Brother   . Prostate cancer Brother   . Cancer Brother     Soft tissue in the face/temple  . Stroke Maternal Aunt     >65  . Stroke Maternal Grandmother     >65  . Stroke Maternal Grandfather 64  . Colon polyps Mother   . Kidney disease Mother     No Known Allergies  Current  Outpatient Prescriptions on File Prior to Visit  Medication Sig Dispense Refill  . albuterol (PROVENTIL HFA;VENTOLIN HFA) 108 (90 Base) MCG/ACT inhaler Inhale 2 puffs into the lungs every 6 (six) hours as needed for wheezing or shortness of breath. 1 Inhaler 2  . ALPRAZolam (XANAX) 0.5 MG tablet Take 0.5 mg by mouth 3 (three) times daily as needed for anxiety.     . Cholecalciferol (VITAMIN D3) 2000 UNITS TABS Take 1 tablet by mouth daily.    . fexofenadine (ALLEGRA) 180 MG tablet Take 180 mg by mouth as needed. Reported on 07/18/2015    . FLUoxetine (PROZAC) 40 MG capsule Take 40 mg by mouth every morning.     . fluticasone (FLONASE) 50 MCG/ACT nasal spray Place 1 spray into both nostrils 2 (two) times daily as needed for rhinitis. 16 g 3  . hydrochlorothiazide (MICROZIDE) 12.5 MG capsule Take 1 capsule (12.5 mg total) by mouth daily. 30 capsule 1  . losartan (COZAAR) 50 MG tablet Take 1 tablet (50 mg total) by mouth daily. 30 tablet 1  . omeprazole (PRILOSEC) 20 MG capsule Take 1 capsule (20 mg total) by mouth daily. 90 capsule 3  . SYNTHROID 100 MCG tablet TAKE ONE TABLET DAILY AND TAKE ONE-HALF (1/2) TABLET ON SUNDAY 90 tablet 1   No current facility-administered medications on file prior to visit.    BP 120/80 mmHg  Pulse 76  Temp(Src) 98.1 F (36.7 C) (Oral)  Ht 5' 4.5" (1.638 m)  Wt 217 lb 9.6 oz (98.703 kg)  BMI 36.79 kg/m2  SpO2 96%       Objective:   Physical Exam   General  Mental Status - Alert. General Appearance - Well groomed. Not in acute distress.  Skin Rashes- No Rashes.  HEENT Head- Normal. Ear Auditory Canal - Left- Normal. Right - Normal.Tympanic Membrane- Left- Normal. Right- Normal. Eye Sclera/Conjunctiva- Left- Normal. Right- Normal. Nose & Sinuses Nasal Mucosa- Left-  Boggy and Congested. Right-  Boggy and  Congested.Bilateral maxillary and frontal sinus pressure. Mouth & Throat Lips: Upper Lip- Normal: no dryness, cracking, pallor, cyanosis, or  vesicular eruption. Lower Lip-Normal: no dryness, cracking, pallor, cyanosis or vesicular eruption. Buccal Mucosa- Bilateral- No Aphthous ulcers. Oropharynx- No Discharge or Erythema. Tonsils: Characteristics- Bilateral- No Erythema or Congestion. Size/Enlargement- Bilateral- No enlargement. Discharge- bilateral-None.  Neck Neck- Supple. No Masses.   Chest and Lung Exam Auscultation: Breath Sounds:-Clear even and unlabored.  Cardiovascular Auscultation:Rythm- Regular, rate and rhythm. Murmurs & Other Heart Sounds:Ausculatation of the heart reveal- No Murmurs.  Lymphatic Head & Neck  General Head & Neck Lymphatics: Bilateral: Description- No Localized lymphadenopathy.      Assessment & Plan:  For your history of bronchitis and recent choking episode will get cxr. See if have evidence of  Pneumonia.  Will go ahead and rx doxycycline for possible recurrent bronchitis.  For cough rx benzonatate.  For wheezing rx flovent and albuterol.  For night sweats  Which occur occasinoally will get cbc.  Discuss hot flash with Dr. Patsy Lager. You could try black cohosh otc.  Follow up in 7-10 days or as needed  Shareeka Yim, Ramon Dredge, [employer]

## 2015-11-09 NOTE — Patient Instructions (Addendum)
For your history of bronchitis and recent choking episode will get cxr. See if have evidence of  Pneumonia.  Will go ahead and rx doxycycline for possible recurrent bronchitis.  For cough rx benzonatate.  For wheezing rx flovent and albuterol.  For night sweats which  occur occasinoally will get cbc.  Discuss hot flash with Dr. Patsy Lager. You could try black cohosh otc.  Follow up in 7-10 days or as needed

## 2015-11-10 LAB — CBC WITH DIFFERENTIAL/PLATELET
Basophils Absolute: 0 10*3/uL (ref 0.0–0.1)
Basophils Relative: 0.4 % (ref 0.0–3.0)
Eosinophils Absolute: 0.4 10*3/uL (ref 0.0–0.7)
Eosinophils Relative: 5.2 % — ABNORMAL HIGH (ref 0.0–5.0)
HCT: 38.3 % (ref 36.0–46.0)
Hemoglobin: 12.7 g/dL (ref 12.0–15.0)
Lymphocytes Relative: 23 % (ref 12.0–46.0)
Lymphs Abs: 1.6 10*3/uL (ref 0.7–4.0)
MCHC: 33.2 g/dL (ref 30.0–36.0)
MCV: 91.7 fl (ref 78.0–100.0)
Monocytes Absolute: 0.5 10*3/uL (ref 0.1–1.0)
Monocytes Relative: 6.9 % (ref 3.0–12.0)
Neutro Abs: 4.5 10*3/uL (ref 1.4–7.7)
Neutrophils Relative %: 64.5 % (ref 43.0–77.0)
Platelets: 313 10*3/uL (ref 150.0–400.0)
RBC: 4.18 Mil/uL (ref 3.87–5.11)
RDW: 14.2 % (ref 11.5–15.5)
WBC: 7.1 10*3/uL (ref 4.0–10.5)

## 2015-11-11 NOTE — Progress Notes (Signed)
Quick Note:  Pt has seen results on MyChart and message also sent for patient to call back if any questions. ______ 

## 2015-11-15 ENCOUNTER — Ambulatory Visit (INDEPENDENT_AMBULATORY_CARE_PROVIDER_SITE_OTHER): Payer: Medicare Other | Admitting: Internal Medicine

## 2015-11-15 ENCOUNTER — Encounter: Payer: Self-pay | Admitting: Internal Medicine

## 2015-11-15 VITALS — BP 130/82 | HR 62 | Wt 216.0 lb

## 2015-11-15 DIAGNOSIS — E041 Nontoxic single thyroid nodule: Secondary | ICD-10-CM

## 2015-11-15 DIAGNOSIS — E559 Vitamin D deficiency, unspecified: Secondary | ICD-10-CM | POA: Diagnosis not present

## 2015-11-15 DIAGNOSIS — E039 Hypothyroidism, unspecified: Secondary | ICD-10-CM | POA: Diagnosis not present

## 2015-11-15 DIAGNOSIS — E210 Primary hyperparathyroidism: Secondary | ICD-10-CM | POA: Diagnosis not present

## 2015-11-15 LAB — VITAMIN D 25 HYDROXY (VIT D DEFICIENCY, FRACTURES): VITD: 45.13 ng/mL (ref 30.00–100.00)

## 2015-11-15 NOTE — Patient Instructions (Signed)
Please stop at the lab.  Continue Vitamin D 2000 units daily.  Continue Levothyroxine 100 mcg 6/7 days and 50 mcg 1/7 days.  Take the thyroid hormone every day, with water, >30 minutes before breakfast, separated by >4 hours from acid reflux medications, calcium, iron, multivitamins.  Please return in 1 year.

## 2015-11-15 NOTE — Progress Notes (Signed)
Subjective:     Patient ID: Gail Fuentes, female   DOB: 12-Sep-1945, 70 y.o.   MRN: [id number]  HPI [name] is a pleasant 70 y/o returning for f/u for dx hyperparathyroidism, s/p parathyroidectomy, hypothyroidism, and a R thyroid nodule. Last visit 1 year ago  Pt had a thyroid ultrasound (10/08/2012) showing only a right thyroid nodule, 7 x 7 x 7 mm, with internal calcifications.   A technetium sestamibi scan showed a left inferior parathyroid adenoma and a CT scan confirmed this. However, during surgery, it was found that she had a left superior parathyroid adenoma, which was excised - left superior parathyroidectomy with Dr Gerrit Friends on 02/13/2013.   She feels better after the surgery. She feels she can think clearer and has more energy.   Reviewed previous PTH and Ca levels: Lab Results  Component Value Date   PTH 41 11/15/2014   PTH Comment 11/15/2014   PTH 44 03/16/2014   PTH Comment 03/16/2014   PTH 72.1* 09/03/2013   CALCIUM 9.5 07/18/2015   CALCIUM 9.5 11/15/2014   CALCIUM 9.4 09/24/2014   CALCIUM 8.8 03/16/2014   CALCIUM 9.3 09/03/2013   CALCIUM 9.2 04/20/2013   CALCIUM 9.1 03/16/2013   CALCIUM 9.1 02/27/2013   CALCIUM 10.7* 02/10/2013   CALCIUM 10.2 10/20/2012   CALCIUM 10.5 10/02/2012   CALCIUM 10.9* 10/01/2012   CALCIUM 10.7* 12/27/2011   CALCIUM 10.5 09/21/2010   CALCIUM 10.2 12/27/2008   She does not have a history of kidney stones, had an osteoporotic T score only at UD radius per DEXA in 2014, no fractures.  She has a h/o vit D def >> was on Ergo 50,000 IU once a week for 1-2 years. Now on 2000 units of vit D3 daily.  She got nausea with calcium supplements in the past, before the surgery.  Latest vit D level was normal: Lab Results  Component Value Date   VD25OH 34.16 07/18/2015   VD25OH 30.52 10/12/2014   VD25OH 19.76* 03/16/2014   VD25OH 35 09/03/2013   VD25OH 35 11/04/2012   VD25OH 59 12/27/2011   VD25OH 48 03/30/2011   VD25OH 50 03/06/2010    VD25OH 37 07/04/2009   VD25OH 31 12/27/2008   She has a long h/o hypothyroidism.   She takes 100 mcg LT4 6/7 days and 50 mcg 1/7 days: - fasting - east b'fast >30 min later - now separated from PPIs - later pm or evening - every other day - no Calcium and MVI  Latest TSH levels: Lab Results  Component Value Date   TSH 1.84 07/18/2015   TSH 1.27 11/15/2014   TSH 0.95 09/21/2014   TSH 0.32* 07/13/2014   TSH 0.28* 05/19/2014   FREET4 0.92 11/15/2014   FREET4 0.77 09/21/2014   FREET4 0.95 07/13/2014   FREET4 0.95 05/19/2014   FREET4 0.82 03/16/2014    Review of Systems Constitutional: + weight gain, + fatigue, + hot flushes, no poor sleep Eyes: + blurry vision, no xerophthalmia ENT: no sore throat, no  nodules palpated in throat, no dysphagia/no odynophagia, + hoarseness, + tinnitus Cardiovascular: no CP/+ SOB/no palpitations/leg swelling Respiratory: + cough/+ SOB/+ wheezing Gastrointestinal: + all: N/V/D/C/GERD Musculoskeletal: + muscle/+ joint aches Skin: + rash, no hair loss Neurological: no tremors/numbness/tingling/dizziness, + HA  I reviewed pt's medications, allergies, PMH, social hx, family hx, and changes were documented in the history of present illness. Otherwise, unchanged from my initial visit note.  Objective:   Physical Exam BP 130/82 mmHg  Pulse 62  Wt 216 lb (97.977 kg)  SpO2 94% Body mass index is 36.52 kg/(m^2). Wt Readings from Last 3 Encounters:  11/15/15 216 lb (97.977 kg)  11/09/15 217 lb 9.6 oz (98.703 kg)  10/25/15 216 lb (97.977 kg)   Constitutional: overweight, in NAD Eyes: PERRLA, EOMI, no exophthalmos ENT: moist mucous membranes, no thyromegaly, cervical scar is still erythematous, swollen; no cervical lymphadenopathy; Chvostek sign negative bilaterally Cardiovascular: RRR, No MRG Respiratory: CTA B Gastrointestinal: abdomen soft, NT, ND, BS+ Musculoskeletal: no deformities, strength intact in all 4 Skin: moist, warm, no  rashes Neurological: very mild tremor with outstretched hands, DTR normal in all 4  Assessment:     1. Primary hyperparathyroidism - status post left superior parathyroidectomy 02/13/2013, by Dr. Gerrit Friends - calcium < 1 mg/dl above normal before the surgery - elevated PTH (133, 238) - normal vit D  - elevated 1,25 vitamin D - no renal failure - osteopenia  - no h/o kidney stones - DEXA 01/23/2010 ([facility]):  L1-4 T score -2.0  Left femoral neck T score -1.9  Right femoral neck T score -1.4  10 year any fracture risk 13.8%, 10 year hip fracture risk 2.9% - CT neck 01/14/2013:    Focal nodular appearance along the posterior aspect of the left lobe of thyroid corresponds with a sestamibi exam and suggests a left lower parathyroid adenoma.   No significant nodular tissue adjacent to the submandibular glands.   Sub centimeter lymph nodes are likely within normal limits.  Focal calcification is noted within the right lobe of the thyroid.  - Tc sestamibi scan 12/29/2012: suspicious for left thyroid lobe region parathyroid  adenoma. Questionable abnormal focus also in the right submandibular space - DEXA 11/14/2012 ([facility]):  L1-4 T score -2.0 >> -2.1 (but high SD, L1 T score is -2.8)  Left femoral neck T score -1.9 >> -1.9  Right femoral neck T score -1.4 >> -1.7  33% distal radius: -1.5  But worrisome that UD radius -3.1 (good evaluation for trabecular bone) - UCa 300 mg/24h (11/06/2012)   She had parathyroidectomy with Dr Gerrit Friends on 02/13/2013.   2. Thyroid nodule - thyroid ultrasound 10/08/2012: Thyroid echotexture is heterogeneous.   Right lobe: Measures 3 x 1.2 x 1.1 cm.   Left lobe: Measures 2.9 x 1.9 x 0.9 cm.   Isthmus: Measures 0.4 cm.   Focal lesions: Within the lower pole of the right lobe of thyroid gland there is a hypoechoic nodule containing coarsened calcifications. This measures 7 x 7 x 7 mm.   No lymphadenopathy identified. - thyroid U/S  03/22/2014:  Right thyroid lobe: 3.4 x 1.1 x 1.0 cm. Stable to slightly smaller focal area of nodularity with dystrophic shadowing calcifications measures approximately 0.6 cm in diameter. The thyroid parenchyma is very heterogeneous and shows no abnormal vascularity.  Left thyroid lobe: 2.9 x 1.1 x 1.1 cm. No nodules visualized. Heterogeneous parenchyma noted without abnormal vascularity. The left lobe is small in size.  Isthmus Thickness: 0.9 cm. No nodules visualized.  Lymphadenopathy None visualized.  3. Hypothyroidism  Plan:     1. Primary Hyperparathyroidism: Pt with h/o primary hyperparathyroidism, with minimally elevated calcium levels but with very high PTH levels in the past  Localization studies were positive for left parathyroid adenoma and she had a parathyroidectomy on 02/13/2013 >> 0.5 g adenoma resected >> Ca and PTH normal postop - She feels very well after the surgery - will check PTH and calcium today. Recent vit D level reviewed: normal, but we  will recheck.  2. R thyroid nodule - she had a R 7 mm thyroid nodule, with internal calcification - stable or even smaller per last U/S - repeat the U/S to check for growth next year  3. Hypothyroidism - we reviewed her previous TSH levels - normal - she is on LT4 100 mcg 6/7 and 50 mcg 1/7 days >> continue this dose for now - she is taking the LT4 correctly now: every day, with water, >30 minutes before breakfast, separated by >4 hours from acid reflux medications, calcium, iron, multivitamins.  4. Vit D deficiency - on vitamin D 2000 units daily - will continue this dose but check level today  Carlus Pavlov, MD PhD [facility]  Office Visit on 11/15/2015  Component Date Value Ref Range Status  . VITD 11/15/2015 45.13  30.00 - 100.00 ng/mL Final  . Calcium 11/15/2015 9.7  8.7 - 10.3 mg/dL Final  . PTH 28/41/3244 35  15 - 65 pg/mL Final  . PTH 11/15/2015 Comment   Final   Comment: Interpretation                  Intact PTH    Calcium                                 (pg/mL)      (mg/dL) Normal                          15 - 65     8.6 - 10.2 Primary Hyperparathyroidism         >65          >10.2 Secondary Hyperparathyroidism       >65          <10.2 Non-Parathyroid Hypercalcemia       <65          >10.2 Hypoparathyroidism                  <15          < 8.6 Non-Parathyroid Hypocalcemia    15 - 65          < 8.6    Excellent results.

## 2015-11-16 LAB — PTH, INTACT AND CALCIUM
Calcium: 9.7 mg/dL (ref 8.7–10.3)
PTH: 35 pg/mL (ref 15–65)

## 2015-11-17 ENCOUNTER — Encounter: Payer: Self-pay | Admitting: Family Medicine

## 2015-11-17 ENCOUNTER — Ambulatory Visit (INDEPENDENT_AMBULATORY_CARE_PROVIDER_SITE_OTHER): Payer: Medicare Other | Admitting: Family Medicine

## 2015-11-17 VITALS — BP 132/82 | HR 59 | Temp 97.9°F | Ht 65.0 in | Wt 212.0 lb

## 2015-11-17 DIAGNOSIS — F341 Dysthymic disorder: Secondary | ICD-10-CM | POA: Diagnosis not present

## 2015-11-17 DIAGNOSIS — E785 Hyperlipidemia, unspecified: Secondary | ICD-10-CM | POA: Diagnosis not present

## 2015-11-17 DIAGNOSIS — R7309 Other abnormal glucose: Secondary | ICD-10-CM | POA: Diagnosis not present

## 2015-11-17 DIAGNOSIS — Z0000 Encounter for general adult medical examination without abnormal findings: Secondary | ICD-10-CM

## 2015-11-17 LAB — LIPID PANEL
CHOLESTEROL: 237 mg/dL — AB (ref 0–200)
HDL: 72.2 mg/dL (ref >39.00)
LDL Cholesterol: 153 mg/dL — ABNORMAL HIGH (ref 0–99)
NonHDL: 164.49
Total CHOL/HDL Ratio: 3
Triglycerides: 59 mg/dL (ref 0.0–149.0)
VLDL: 11.8 mg/dL (ref 0.0–40.0)

## 2015-11-17 LAB — HEMOGLOBIN A1C: HEMOGLOBIN A1C: 5.7 % (ref 4.6–6.5)

## 2015-11-17 NOTE — Progress Notes (Signed)
[place] at [facility] [address] [place], [place] [address] [phone]  Date:  11/17/2015   Name:  Gail Fuentes   DOB:  August 27, 1945   MRN:  [id number]  PCP:  Lamar Blinks, MD    Chief Complaint: Annual Exam   History of Present Illness:  Gail Fuentes is a 70 y.o. very pleasant female patient who presents with the following:  Here today for a CPE- history of HTN, hyperglycemia, hypothyroid, diverticulitis.   She was recently ill, was treated with azithromcyin and doxycycline. She is feeling better except for hoarse voice.   Pt is fasting for labs today. Last PAP: May 2017, Last Mammogram: 02/2015, Last colonoscopy: 2016. Pt has declined zoster vaccine at this time.  Pt of Dr. Cruzita Lederer for endocrinology and she also sees OBG- Dr. Nori Riis.   She is taking prozac 40 and xanax as needed- she takes xanax a few times a month at most. She has been on prozac for a very long time, the dose has not changed in years. She continues to note a low level sadness and dissatisfaction with life in general. She is not severely depressed or suicidal but just feels like there is not much to look forward to in life.  She would like to change this," I want to start seeing the glass as half full."  She has tried counseling in the past but did not feel it helped that much  Recent labs in March were normal- CMP, CBC, TSH, calcium, PTH  Last cholesterol panel a year ago BP controlled with HCTZ and cozaar.  She notes that at home her SBP is 130. DBP 70- 80.  No SE of medications She is fasting today  BP Readings from Last 3 Encounters:  11/17/15 131/54  11/15/15 130/82  11/09/15 120/80    Lab Results  Component Value Date   HGBA1C 5.5 10/08/2013      Patient Active Problem List   Diagnosis Date Noted  . History of hyperparathyroidism 08/24/2014  . Obesity (BMI 30-39.9) 10/08/2013  . HTN (hypertension) 07/13/2013  . Elevated blood  pressure reading without diagnosis of hypertension 06/29/2013  . Osteopenia 11/26/2012  . Acute iritis of both eyes 10/17/2012  . Thyroid nodule 10/17/2012  . Cardiomegaly 02/20/2012  . DISTURBANCES OF SENSATION OF SMELL AND TASTE 04/19/2010  . Vitamin D deficiency 12/24/2008  . ANXIETY 12/24/2008  . DEPRESSION 12/24/2008  . BENIGN PAROXYSMAL POSITIONAL VERTIGO 12/24/2008  . HYPERGLYCEMIA 12/24/2008  . Nonspecific abnormal electrocardiogram (ECG) (EKG) 12/24/2008  . Hyperlipidemia 07/23/2007  . Hypothyroidism 07/22/2007  . GERD 07/22/2007  . DIVERTICULITIS, HX OF 07/22/2007    Past Medical History  Diagnosis Date  . Hyperlipidemia   . Vitamin D deficiency   . Benign paroxysmal positional vertigo   . Other abnormal glucose   . Depressive disorder, not elsewhere classified   . Unspecified hypothyroidism   . Esophageal reflux   . Personal history of other diseases of digestive system     12-15 since 1990, no recurrence 2006  . Allergy     sneezing  late Fall  . Anemia 01/23/12    H/H 11.7/35  . Abnormal EKG     per patient- states evaluated by Dr Verl Blalock in the past  . Anxiety state, unspecified   . PONV (postoperative nausea and vomiting)   . Arthritis   . History of hyperparathyroidism   . Esophageal stricture   . Hiatal hernia   .  Diverticulosis   . Depression   . Gallstones   . Heart murmur   . History of UTI     Past Surgical History  Procedure Laterality Date  . Dental surgery  1962, 2012    Age 55-Abscessed Teeth Extracted   . Cesarean section  1977  . Colonoscopy  12/2003    Tics  . Total abdominal hysterectomy w/ bilateral salpingoophorectomy  2005    Ovarian Cyst  . Cholecystectomy  2004  . Parathyroidectomy Left 02/13/2013    Procedure: LEFT INFERIOR PARATHYROIDECTOMY with frozen section ;  Surgeon: Earnstine Regal, MD;  Location: [facility];  Service: General;  Laterality: Left;    Social History  Substance Use Topics  . Smoking status: Former Smoker     Quit date: 05/07/1973  . Smokeless tobacco: Never Used     Comment: Smoked 1968-1975 up to 2 ppd  . Alcohol Use: 8.4 oz/week    14 Standard drinks or equivalent per week     Comment: 2 nightly    Family History  Problem Relation Age of Onset  . Coronary artery disease Father   . Heart attack Father 57  . COPD Father   . Diabetes Mother   . Dysrhythmia Mother   . Depression Mother   . COPD Mother   . Prostate cancer Brother   . Other Brother     Wegener's in sinus; [place]  . Hypertension Brother   . Prostate cancer Brother   . Cancer Brother     Soft tissue in the face/temple  . Stroke Maternal Aunt     >65  . Stroke Maternal Grandmother     >65  . Stroke Maternal Grandfather 57  . Colon polyps Mother   . Kidney disease Mother     No Known Allergies  Medication list has been reviewed and updated.  Current Outpatient Prescriptions on File Prior to Visit  Medication Sig Dispense Refill  . albuterol (PROVENTIL HFA;VENTOLIN HFA) 108 (90 Base) MCG/ACT inhaler Inhale 2 puffs into the lungs every 6 (six) hours as needed for wheezing or shortness of breath. 1 Inhaler 2  . ALPRAZolam (XANAX) 0.5 MG tablet Take 0.5 mg by mouth 3 (three) times daily as needed for anxiety.     . benzonatate (TESSALON) 100 MG capsule Take 1 capsule (100 mg total) by mouth 3 (three) times daily as needed for cough. 21 capsule 0  . Cholecalciferol (VITAMIN D3) 2000 UNITS TABS Take 1 tablet by mouth daily.    . cyanocobalamin 100 MCG tablet Take 100 mcg by mouth daily.    [name] doxycycline (VIBRA-TABS) 100 MG tablet Take 1 tablet (100 mg total) by mouth 2 (two) times daily. 20 tablet 0  . fexofenadine (ALLEGRA) 180 MG tablet Take 180 mg by mouth as needed. Reported on 07/18/2015    . FLUoxetine (PROZAC) 40 MG capsule Take 40 mg by mouth every morning.     . fluticasone (FLONASE) 50 MCG/ACT nasal spray Place 1 spray into both nostrils 2 (two) times daily as needed for rhinitis. 16 g 3  . fluticasone (FLOVENT  HFA) 110 MCG/ACT inhaler Inhale 2 puffs into the lungs 2 (two) times daily. 1 Inhaler 2  . hydrochlorothiazide (MICROZIDE) 12.5 MG capsule Take 1 capsule (12.5 mg total) by mouth daily. 30 capsule 1  . losartan (COZAAR) 50 MG tablet Take 1 tablet (50 mg total) by mouth daily. 30 tablet 1  . omeprazole (PRILOSEC) 20 MG capsule Take 1 capsule (20 mg total) by  mouth daily. 90 capsule 3  . SYNTHROID 100 MCG tablet TAKE ONE TABLET DAILY AND TAKE ONE-HALF (1/2) TABLET ON SUNDAY 90 tablet 1   No current facility-administered medications on file prior to visit.    Review of Systems:  As per HPI- otherwise negative.   Physical Examination: Filed Vitals:   11/17/15 1034  BP: 131/54  Pulse: 59  Temp: 97.9 F (36.6 C)   Filed Vitals:   11/17/15 1034  Height: 5' 5" (1.651 m)  Weight: 212 lb (96.163 kg)   Body mass index is 35.28 kg/(m^2). Ideal Body Weight: Weight in (lb) to have BMI = 25: 149.9  GEN: WDWN, NAD, Non-toxic, A & O x 3, obese, looks well HEENT: Atraumatic, Normocephalic. Neck supple. No masses, No LAD.  Bilateral TM wnl, oropharynx normal.  PEERL,EOMI.   Ears and Nose: No external deformity. CV: RRR, No M/G/R. No JVD. No thrill. No extra heart sounds. PULM: CTA B, no wheezes, crackles, rhonchi. No retractions. No resp. distress. No accessory muscle use. ABD: S, NT, ND. No rebound. No HSM. EXTR: No c/c/e NEURO Normal gait.  PSYCH: Normally interactive. Conversant. Not depressed or anxious appearing.  Calm demeanor.    Assessment and Plan: Physical exam  Dysthymia  Hyperlipidemia - Plan: Lipid panel  Elevated glucose - Plan: Hemoglobin A1c  Here today for a CPE.  She would like to get her zostavax in December when her deductible will be met   Will check her lipids and A1c today BP is controlled Again discussed ways in which she might try improving her mood- counseling, medication changes, lifestyle changes. Encouraged her to find ways to help others and be more  involved in activities and she will try.  She wants to try tapering off the prozac as she does not think it is helping her-   It was good to see you today. If you would like to try tapering off the prozac I think that is ok; if you want to try a different medication or go back on the prozac later that is ok.  If you notice any significant worsening of your mood with coming off prozac please let me know right away   I would encourage you to look for opportunities to share your gifts and talents in service to others- this may help to boost your mood  Your blood pressure looks good- continue same BP medications I will check your cholesterol and also an A1c (test for average blood sugar over 3 months)  Let's plan to meet in about 6 month  Signed Lamar Blinks, MD

## 2015-11-17 NOTE — Patient Instructions (Addendum)
It was good to see you today. If you would like to try tapering off the prozac I think that is ok; if you want to try a different medication or go back on the prozac later that is ok.  If you notice any significant worsening of your mood with coming off prozac please let me know right away   I would encourage you to look for opportunities to share your gifts and talents in service to others- this may help to boost your mood  Your blood pressure looks good- continue same BP medications I will check your cholesterol and also an A1c (test for average blood sugar over 3 months)  Let's plan to meet in about 6 months

## 2015-11-17 NOTE — Progress Notes (Signed)
Pre visit review using our clinic review tool, if applicable. No additional management support is needed unless otherwise documented below in the visit note. 

## 2015-12-01 ENCOUNTER — Encounter: Payer: Self-pay | Admitting: Family Medicine

## 2015-12-01 DIAGNOSIS — H9209 Otalgia, unspecified ear: Secondary | ICD-10-CM

## 2015-12-02 NOTE — Addendum Note (Signed)
Addended by: Abbe Amsterdam C on: 12/02/2015 12:44 PM   Modules accepted: Orders

## 2015-12-21 DIAGNOSIS — M542 Cervicalgia: Secondary | ICD-10-CM | POA: Insufficient documentation

## 2015-12-21 DIAGNOSIS — M2669 Other specified disorders of temporomandibular joint: Secondary | ICD-10-CM | POA: Insufficient documentation

## 2015-12-21 DIAGNOSIS — H9201 Otalgia, right ear: Secondary | ICD-10-CM | POA: Insufficient documentation

## 2016-01-21 ENCOUNTER — Other Ambulatory Visit: Payer: Self-pay | Admitting: Internal Medicine

## 2016-01-23 ENCOUNTER — Other Ambulatory Visit: Payer: Self-pay

## 2016-01-23 MED ORDER — LEVOTHYROXINE SODIUM 100 MCG PO TABS: ORAL_TABLET | ORAL | 1 refills | Status: DC

## 2016-01-30 NOTE — Telephone Encounter (Signed)
Patient need a new prescription for levothyroxine (SYNTHROID) 100 MCG tablet with refills. Notify the patient when sent. She is out.  [facility] - [place], [place] - [address] [phone] (Phone) [phone] (Fax)

## 2016-01-31 ENCOUNTER — Other Ambulatory Visit: Payer: Self-pay

## 2016-01-31 MED ORDER — LEVOTHYROXINE SODIUM 100 MCG PO TABS: ORAL_TABLET | ORAL | 1 refills | Status: DC

## 2016-01-31 NOTE — Telephone Encounter (Signed)
called patient left voice mail to let her know I ordered her levothyroxine

## 2016-03-05 ENCOUNTER — Telehealth: Payer: Self-pay | Admitting: Family Medicine

## 2016-03-05 NOTE — Telephone Encounter (Signed)
Relation to UU:VOZD Call back [phone]   Reason for call:  Patient [facility]. Patient will request mamo report from GYN fax to [phone] Attention Dr. Patsy Lager. Patient would like chart update to reflect

## 2016-03-08 NOTE — Telephone Encounter (Signed)
Flu has been updated PC

## 2016-05-18 ENCOUNTER — Ambulatory Visit (INDEPENDENT_AMBULATORY_CARE_PROVIDER_SITE_OTHER): Payer: Medicare Other | Admitting: Medical

## 2016-05-18 ENCOUNTER — Encounter: Payer: Self-pay | Admitting: Medical

## 2016-05-18 VITALS — BP 108/72 | HR 81 | Temp 99.5°F | Resp 18 | Ht 65.0 in | Wt 215.8 lb

## 2016-05-18 DIAGNOSIS — J3489 Other specified disorders of nose and nasal sinuses: Secondary | ICD-10-CM | POA: Diagnosis not present

## 2016-05-18 DIAGNOSIS — Z20818 Contact with and (suspected) exposure to other bacterial communicable diseases: Secondary | ICD-10-CM

## 2016-05-18 DIAGNOSIS — R062 Wheezing: Secondary | ICD-10-CM

## 2016-05-18 DIAGNOSIS — R059 Cough, unspecified: Secondary | ICD-10-CM

## 2016-05-18 DIAGNOSIS — J029 Acute pharyngitis, unspecified: Secondary | ICD-10-CM | POA: Diagnosis not present

## 2016-05-18 DIAGNOSIS — R05 Cough: Secondary | ICD-10-CM

## 2016-05-18 LAB — POCT RAPID STREP A (OFFICE): Rapid Strep A Screen: NEGATIVE

## 2016-05-18 MED ORDER — BENZONATATE 100 MG PO CAPS: 100.0000 mg | ORAL_CAPSULE | Freq: Three times a day (TID) | ORAL | 0 refills | Status: DC | PRN

## 2016-05-18 MED ORDER — OSELTAMIVIR PHOSPHATE 75 MG PO CAPS: 75.0000 mg | ORAL_CAPSULE | Freq: Two times a day (BID) | ORAL | 0 refills | Status: DC

## 2016-05-18 MED ORDER — AMOXICILLIN-POT CLAVULANATE 875-125 MG PO TABS
1.0000 | ORAL_TABLET | Freq: Two times a day (BID) | ORAL | 0 refills | Status: DC
Start: 2016-05-18 — End: 2016-05-21

## 2016-05-18 NOTE — Progress Notes (Signed)
Pre visit review using our clinic review tool, if applicable. No additional management support is needed unless otherwise documented below in the visit note/SLS  

## 2016-05-18 NOTE — Patient Instructions (Addendum)
You appear to have a sinus infection with exposure to strep(strep test neg). I am prescribing antibiotic for the infection. To help with the nasal congestion use flonase. For your associated cough, I prescribed cough medicine.  Rest, hydrate, tylenol for fever.  If you have more body aches by tomorrow then start tamiflu(making available). No test available today. Not flu like today but is worsens then start.  Recommend using you inhalers as advised.  Follow up in 7 days or as needed.  Pt mentioned that she has transient episodes of heart racing. On and off. Pt today has RRR. She will see cardiologist next week for pre scheduled appointment. She may be some dehydrates so recommend hydrate with propel. If any palpitations or fast hr sensation during interim return here or ED.

## 2016-05-18 NOTE — Progress Notes (Signed)
Subjective:    Patient ID: Gail Fuentes, female    DOB: [birth year], 71 y.o.   MRN: [id number]  HPI  Pt in with some head congestion, nasal congestion, sinus pressure, ear pressure, pnd productive cough and st. Low grade fever for 3 days.   Pt grand daughter has strep. She has been taking care of her grand-daughter with strep.   Review of Systems  Constitutional: Positive for fever. Negative for chills and fatigue.  HENT: Positive for congestion, postnasal drip, sinus pain and sinus pressure.   Respiratory: Positive for cough and wheezing. Negative for chest tightness and shortness of breath.        Some wheezing at night.  She did use inhaler last night   Cardiovascular: Negative for chest pain and palpitations.  Gastrointestinal: Negative for abdominal pain.  Musculoskeletal: Positive for myalgias. Negative for back pain and neck pain.       Some aches and pain mild today.  Neurological: Negative for headaches.  Hematological: Negative for adenopathy. Does not bruise/bleed easily.  Psychiatric/Behavioral: Negative for behavioral problems and confusion.    Past Medical History:  Diagnosis Date  . Abnormal EKG    per patient- states evaluated by Dr Daleen Squibb in the past  . Allergy    sneezing  late Fall  . Anemia 01/23/12   H/H 11.7/35  . Anxiety state, unspecified   . Arthritis   . Benign paroxysmal positional vertigo   . Depression   . Depressive disorder, not elsewhere classified   . Diverticulosis   . Esophageal reflux   . Esophageal stricture   . Gallstones   . Heart murmur   . Hiatal hernia   . History of hyperparathyroidism   . History of UTI   . Hyperlipidemia   . Other abnormal glucose   . Personal history of other diseases of digestive system    12-15 since 1990, no recurrence 2006  . PONV (postoperative nausea and vomiting)   . Unspecified hypothyroidism   . Vitamin D deficiency      Social History   Social History  . Marital status: Married   Spouse name: N/A  . Number of children: 1  . Years of education: N/A   Occupational History  . 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 Smoking status: Former Smoker    Quit date: 05/07/1973  . Smokeless tobacco: Never Used     Comment: Smoked 1968-1975 up to 2 ppd  . Alcohol use 8.4 oz/week    14 Standard drinks or equivalent per week     Comment: 2 nightly  . Drug use: No  . Sexual activity: Not on file   Other Topics Concern  . Not on file   Social History Narrative   Regular exercise: no   Caffeine use: daily    Past Surgical History:  Procedure Laterality Date  . CESAREAN SECTION  1977  . CHOLECYSTECTOMY  2004  . COLONOSCOPY  12/2003   Tics  . DENTAL SURGERY  1962, 2012   Age 89-Abscessed Teeth Extracted   . PARATHYROIDECTOMY Left 02/13/2013   Procedure: LEFT INFERIOR PARATHYROIDECTOMY with frozen section ;  Surgeon: Velora Heckler, MD;  Location: [facility];  Service: General;  Laterality: Left;  . TOTAL ABDOMINAL HYSTERECTOMY W/ BILATERAL SALPINGOOPHORECTOMY  2005   Ovarian Cyst    Family History  Problem Relation Age of Onset  . Coronary artery disease Father   .  Heart attack Father 70  . COPD Father   . Diabetes Mother   . Dysrhythmia Mother   . Depression Mother   . COPD Mother   . Prostate cancer Brother   . Other Brother     Wegener's in sinus; [facility]  . Hypertension Brother   . Prostate cancer Brother   . Cancer Brother     Soft tissue in the face/temple  . Stroke Maternal Aunt     >65  . Stroke Maternal Grandmother     >65  . Stroke Maternal Grandfather 40  . Colon polyps Mother   . Kidney disease Mother     No Known Allergies  Current Outpatient Prescriptions on File Prior to Visit  Medication Sig Dispense Refill  . albuterol (PROVENTIL HFA;VENTOLIN HFA) 108 (90 Base) MCG/ACT inhaler Inhale 2 puffs into the lungs every 6 (six) hours as needed for wheezing or shortness of breath. 1 Inhaler  2  . ALPRAZolam (XANAX) 0.5 MG tablet Take 0.5 mg by mouth 3 (three) times daily as needed for anxiety.     . Cholecalciferol (VITAMIN D3) 2000 UNITS TABS Take 1 tablet by mouth daily.    . cyanocobalamin 100 MCG tablet Take 100 mcg by mouth daily.    . fexofenadine (ALLEGRA) 180 MG tablet Take 180 mg by mouth as needed. Reported on 07/18/2015    . FLUoxetine (PROZAC) 40 MG capsule Take 40 mg by mouth every morning.     . fluticasone (FLONASE) 50 MCG/ACT nasal spray Place 1 spray into both nostrils 2 (two) times daily as needed for rhinitis. 16 g 3  . fluticasone (FLOVENT HFA) 110 MCG/ACT inhaler Inhale 2 puffs into the lungs 2 (two) times daily. 1 Inhaler 2  . hydrochlorothiazide (MICROZIDE) 12.5 MG capsule Take 1 capsule (12.5 mg total) by mouth daily. 30 capsule 1  . levothyroxine (SYNTHROID) 100 MCG tablet TAKE ONE TABLET DAILY AND TAKE ONE-HALF (1/2) TABLET ON SUNDAY 90 tablet 1  . losartan (COZAAR) 50 MG tablet Take 1 tablet (50 mg total) by mouth daily. 30 tablet 1  . omeprazole (PRILOSEC) 20 MG capsule Take 1 capsule (20 mg total) by mouth daily. 90 capsule 3   No current facility-administered medications on file prior to visit.     BP 108/72 (BP Location: Right Arm, Patient Position: Sitting, Cuff Size: Large)   Pulse 81   Temp 99.5 F (37.5 C) (Oral)   Resp 18   Ht 5\' 5"  (1.651 m)   Wt 215 lb 12 oz (97.9 kg)   SpO2 97%   BMI 35.90 kg/m       Objective:   Physical Exam  General  Mental Status - Alert. General Appearance - Well groomed. Not in acute distress.  Skin Rashes- No Rashes.  HEENT Head- Normal. Ear Auditory Canal - Left- Normal. Right - Normal.Tympanic Membrane- Left- Normal. Right- Normal. Eye Sclera/Conjunctiva- Left- Normal. Right- Normal. Nose & Sinuses Nasal Mucosa- Left-  Boggy and Congested. Right-  Boggy and  Congested.Bilateral maxillary and frontal sinus pressure. Mouth & Throat Lips: Upper Lip- Normal: no dryness, cracking, pallor, cyanosis,  or vesicular eruption. Lower Lip-Normal: no dryness, cracking, pallor, cyanosis or vesicular eruption. Buccal Mucosa- Bilateral- No Aphthous ulcers. Oropharynx- No Discharge or Erythema. Tonsils: Characteristics- Bilateral- No Erythema or Congestion. Size/Enlargement- Bilateral- No enlargement. Discharge- bilateral-None.  Neck Neck- Supple. No Masses.   Chest and Lung Exam Auscultation: Breath Sounds:-Clear even and unlabored.  Cardiovascular Auscultation:Rythm- Regular, rate and rhythm. Murmurs & Other Heart Sounds:Ausculatation  of the heart reveal- No Murmurs.  Lymphatic Head & Neck General Head & Neck Lymphatics: Bilateral: Description- No Localized lymphadenopathy.       Assessment & Plan:  You appear to have a sinus infection with exposure to strep(strep test neg). I am prescribing antibiotic for the infection. To help with the nasal congestion use flonase. For your associated cough, I prescribed cough medicine.  Rest, hydrate, tylenol for fever.  If you have more body aches by tomorrow then start tamiflu(making available). No test available today. Not flu like today but is worsens then start  Recommend using you inhalers as advised.  Follow up in 7 days or as needed.  Pt mentioned that she has transient episodes of heart racing. On and off. Pt today has RRR. She will see cardiologist next week for pre scheduled appointment. She may be some dehydrates so recommend hydrate with propel. If any palpitations or fast hr sensation during interim return here or ED.  Makenley Shimp, Ramon Dredge, PA-C

## 2016-05-21 ENCOUNTER — Telehealth: Payer: Self-pay | Admitting: Family Medicine

## 2016-05-21 MED ORDER — AZITHROMYCIN 250 MG PO TABS: ORAL_TABLET | ORAL | 0 refills | Status: DC

## 2016-05-21 NOTE — Telephone Encounter (Signed)
Will you notify pt that I sent in azithromycin.

## 2016-05-21 NOTE — Telephone Encounter (Signed)
Patient informed. 

## 2016-05-21 NOTE — Telephone Encounter (Signed)
Augmentin added to allergy list. Please advise alternative.

## 2016-05-21 NOTE — Telephone Encounter (Signed)
Relation to PX:TGGY Call back number:[phone] Pharmacy: [id number] [place], [place] - [address] [phone] (Phone) [phone] (Fax)     Reason for call:  Patient states she started amoxicillin-clavulanate (AUGMENTIN) 875-125 MG tablet Friday night 05/25/16 and noticed Saturday 05/26/16 rash breakout patient d/c medication requesting another Rx, please advise

## 2016-05-25 ENCOUNTER — Ambulatory Visit: Payer: Medicare Other | Admitting: Cardiovascular Disease

## 2016-06-01 ENCOUNTER — Ambulatory Visit (INDEPENDENT_AMBULATORY_CARE_PROVIDER_SITE_OTHER): Payer: Medicare Other | Admitting: Cardiovascular Disease

## 2016-06-01 ENCOUNTER — Encounter: Payer: Self-pay | Admitting: Cardiovascular Disease

## 2016-06-01 VITALS — BP 118/70 | HR 60 | Ht 65.0 in | Wt 213.0 lb

## 2016-06-01 DIAGNOSIS — R0600 Dyspnea, unspecified: Secondary | ICD-10-CM

## 2016-06-01 DIAGNOSIS — R9431 Abnormal electrocardiogram [ECG] [EKG]: Secondary | ICD-10-CM | POA: Diagnosis not present

## 2016-06-01 NOTE — Progress Notes (Signed)
Chief Complaint  Patient presents with  . New Patient (Initial Visit)     History of Present Illness: 71 yo female with history of HTN, HLD, GERD, anxiety, depression, hyperparathyroidism who is here today as a new patient to re-establish in our office. She had been seen remotely in our office by Dr. Daleen Squibb. She was seen then for abnormal EKG. Echo 2011 with normal LV function, no valve issues.   She tells me today that she has occasional dyspnea. No chest pain. No LE edema. No prior heart issues. She does have a FH of CAD.   Primary Care Physician: Abbe Amsterdam, MD   Past Medical History:  Diagnosis Date  . Abnormal EKG    per patient- states evaluated by Dr Daleen Squibb in the past  . Allergy    sneezing  late Fall  . Anemia 01/23/12   H/H 11.7/35  . Anxiety state, unspecified   . Arthritis   . Benign paroxysmal positional vertigo   . Depression   . Depressive disorder, not elsewhere classified   . Diverticulosis   . Esophageal reflux   . Esophageal stricture   . Gallstones   . Heart murmur   . Hiatal hernia   . History of hyperparathyroidism   . History of UTI   . HTN (hypertension)   . Hyperlipidemia   . Other abnormal glucose   . Personal history of other diseases of digestive system    12-15 since 1990, no recurrence 2006  . PONV (postoperative nausea and vomiting)   . Unspecified hypothyroidism   . Vitamin D deficiency     Past Surgical History:  Procedure Laterality Date  . CESAREAN SECTION  1977  . CHOLECYSTECTOMY  2004  . COLONOSCOPY  12/2003   Tics  . DENTAL SURGERY  1962, 2012   Age 85-Abscessed Teeth Extracted   . PARATHYROIDECTOMY Left 02/13/2013   Procedure: LEFT INFERIOR PARATHYROIDECTOMY with frozen section ;  Surgeon: Velora Heckler, MD;  Location: [facility];  Service: General;  Laterality: Left;  . TOTAL ABDOMINAL HYSTERECTOMY W/ BILATERAL SALPINGOOPHORECTOMY  2005   Ovarian Cyst    Current Outpatient Prescriptions  Medication Sig Dispense Refill   . albuterol (PROVENTIL HFA;VENTOLIN HFA) 108 (90 Base) MCG/ACT inhaler Inhale 2 puffs into the lungs every 6 (six) hours as needed for wheezing or shortness of breath. 1 Inhaler 2  . ALPRAZolam (XANAX) 0.5 MG tablet Take 0.5 mg by mouth 3 (three) times daily as needed for anxiety.     . Cholecalciferol (VITAMIN D3) 2000 UNITS TABS Take 1 tablet by mouth daily.    . cyanocobalamin 100 MCG tablet Take 100 mcg by mouth daily.    . fexofenadine (ALLEGRA) 180 MG tablet Take 180 mg by mouth as needed for allergies. Reported on 07/18/2015    . FLUoxetine (PROZAC) 40 MG capsule Take 40 mg by mouth every morning.     . fluticasone (FLONASE) 50 MCG/ACT nasal spray Place 1 spray into both nostrils 2 (two) times daily as needed for rhinitis. 16 g 3  . fluticasone (FLOVENT HFA) 110 MCG/ACT inhaler Inhale 2 puffs into the lungs 2 (two) times daily. 1 Inhaler 2  . hydrochlorothiazide (MICROZIDE) 12.5 MG capsule Take 1 capsule (12.5 mg total) by mouth daily. 30 capsule 1  . levothyroxine (SYNTHROID) 100 MCG tablet TAKE ONE TABLET DAILY AND TAKE ONE-HALF (1/2) TABLET ON SUNDAY 90 tablet 1  . losartan (COZAAR) 50 MG tablet Take 1 tablet (50 mg total) by mouth daily. 30  tablet 1  . omeprazole (PRILOSEC) 20 MG capsule Take 1 capsule (20 mg total) by mouth daily. 90 capsule 3   No current facility-administered medications for this visit.     Allergies  Allergen Reactions  . Augmentin [Amoxicillin-Pot Clavulanate] Rash    Social History   Social History  . Marital status: Married    Spouse name: N/A  . Number of children: 1  . Years of education: N/A   Occupational History  . 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 Smoking status: Former Smoker    Quit date: 05/07/1973  . Smokeless tobacco: Never Used     Comment: Smoked 1968-1975 up to 2 ppd  . Alcohol use 8.4 oz/week    14 Standard drinks or equivalent per week     Comment: 2 nightly  . Drug  use: No  . Sexual activity: Not on file   Other Topics Concern  . Not on file   Social History Narrative   Regular exercise: no   Caffeine use: daily    Family History  Problem Relation Age of Onset  . Coronary artery disease Father   . Heart attack Father 21  . COPD Father   . Diabetes Mother   . Dysrhythmia Mother   . Depression Mother   . COPD Mother   . Prostate cancer Brother   . Other Brother     Wegener's in sinus; [facility]  . Hypertension Brother   . Prostate cancer Brother   . Cancer Brother     Soft tissue in the face/temple  . Stroke Maternal Aunt     >65  . Stroke Maternal Grandmother     >65  . Stroke Maternal Grandfather 61  . Colon polyps Mother   . Kidney disease Mother     Review of Systems:  As stated in the HPI and otherwise negative.   BP 118/70   Pulse 60   Ht 5\' 5"  (1.651 m)   Wt 213 lb (96.6 kg)   BMI 35.45 kg/m   Physical Examination: General: Well developed, well nourished, NAD  HEENT: OP clear, mucus membranes moist  SKIN: warm, dry. No rashes. Neuro: No focal deficits  Musculoskeletal: Muscle strength 5/5 all ext  Psychiatric: Mood and affect normal  Neck: No JVD, no carotid bruits, no thyromegaly, no lymphadenopathy.  Lungs:Clear bilaterally, no wheezes, rhonci, crackles Cardiovascular: Regular rate and rhythm. No murmurs, gallops or rubs. Abdomen:Soft. Bowel sounds present. Non-tender.  Extremities: No lower extremity edema. Pulses are 2 + in the bilateral DP/PT.  EKG:  EKG is ordered today. The ekg ordered today demonstrates NSR, rate 60 bpm. Poor R wave progression precordial leads. Non-specific T wave abn.   Recent Labs: 07/18/2015: ALT 20; BUN 10; Creatinine, Ser 0.91; Potassium 4.1; Sodium 137; TSH 1.84 11/09/2015: Hemoglobin 12.7; Platelets 313.0   Lipid Panel    Component Value Date/Time   CHOL 237 (H) 11/17/2015 1109   CHOL 236 (H) 10/12/2014 1046   TRIG 59.0 11/17/2015 1109   TRIG 109 10/12/2014 1046   TRIG 91  05/13/2006 1031   HDL 72.20 11/17/2015 1109   HDL 90 10/12/2014 1046   CHOLHDL 3 11/17/2015 1109   VLDL 11.8 11/17/2015 1109   LDLCALC 153 (H) 11/17/2015 1109   LDLCALC 124 (H) 10/12/2014 1046   LDLDIRECT 142.7 12/27/2008 1035     Wt Readings from Last 3 Encounters:  06/01/16 213 lb (96.6 kg)  05/18/16 215 lb 12 oz (97.9 kg)  11/17/15 212 lb (96.2 kg)     Other studies Reviewed: Additional studies/ records that were reviewed today include: . Review of the above records demonstrates:   Assessment and Plan:   1. Dyspnea/Abnormal EKG: Her EKG shows poor R wave progression in the precordial leads and possible inferior Q-waves. Unchanged from 2009. Echo in 2011 was normal. She has some dyspnea. Will arrange echo to assess LV function and exclude valve issues. I do not think she needs ischemic testing at this time.   Current medicines are reviewed at length with the patient today.  The patient does not have concerns regarding medicines.  The following changes have been made:  no change  Labs/ tests ordered today include:   Orders Placed This Encounter  Procedures  . EKG 12-Lead  . ECHOCARDIOGRAM COMPLETE    Disposition:   FU with me in 6 months  Signed, Verne Carrow, MD 06/01/2016 2:56 PM    [facility] [address] [place], [place], [place]  [address] Phone: [phone]; Fax: [phone]

## 2016-06-01 NOTE — Patient Instructions (Signed)

## 2016-06-08 ENCOUNTER — Telehealth (HOSPITAL_COMMUNITY): Payer: Self-pay | Admitting: Cardiovascular Disease

## 2016-06-08 NOTE — Telephone Encounter (Signed)
Called pt and spoke with her husband to reschedule appt due to the technician having a meeting at that time. I told the husband that her appt from be moving from 9:30 am to 1pm on the same day and khe voiced that he would have the patient to call back to confirm.

## 2016-06-14 ENCOUNTER — Other Ambulatory Visit: Payer: Self-pay

## 2016-06-14 ENCOUNTER — Ambulatory Visit (HOSPITAL_COMMUNITY): Payer: Medicare Other | Attending: Cardiology

## 2016-06-14 DIAGNOSIS — R0600 Dyspnea, unspecified: Secondary | ICD-10-CM

## 2016-06-14 DIAGNOSIS — R9439 Abnormal result of other cardiovascular function study: Secondary | ICD-10-CM | POA: Insufficient documentation

## 2016-06-14 DIAGNOSIS — I501 Left ventricular failure: Secondary | ICD-10-CM | POA: Insufficient documentation

## 2016-06-15 ENCOUNTER — Telehealth: Payer: Self-pay | Admitting: Cardiovascular Disease

## 2016-06-15 NOTE — Telephone Encounter (Signed)
Pt states she would just like to hear report from Dr Clifton James. I will forward to Dr Clifton James.

## 2016-06-15 NOTE — Telephone Encounter (Signed)
New Message     Pt would like to speak directly with Dr Clifton James about the echo results, Dennie Bible called her and gave her the results and she wants to talk them over with Dr Clifton James   Use cell if no answer at home [phone]

## 2016-06-18 NOTE — Telephone Encounter (Signed)
I discussed the echo results with the patient. cdm

## 2016-07-16 ENCOUNTER — Other Ambulatory Visit: Payer: Self-pay | Admitting: Internal Medicine

## 2016-09-26 ENCOUNTER — Other Ambulatory Visit: Payer: Self-pay | Admitting: Family Medicine

## 2016-09-26 DIAGNOSIS — K219 Gastro-esophageal reflux disease without esophagitis: Secondary | ICD-10-CM

## 2016-11-12 ENCOUNTER — Telehealth: Payer: Self-pay | Admitting: Family Medicine

## 2016-11-12 ENCOUNTER — Other Ambulatory Visit: Payer: Self-pay | Admitting: Emergency Medicine

## 2016-11-12 DIAGNOSIS — I10 Essential (primary) hypertension: Secondary | ICD-10-CM

## 2016-11-12 MED ORDER — HYDROCHLOROTHIAZIDE 12.5 MG PO CAPS: 12.5000 mg | ORAL_CAPSULE | Freq: Every day | ORAL | 0 refills | Status: DC

## 2016-11-12 MED ORDER — LOSARTAN POTASSIUM 50 MG PO TABS: 50.0000 mg | ORAL_TABLET | Freq: Every day | ORAL | 0 refills | Status: DC

## 2016-11-12 NOTE — Telephone Encounter (Signed)
[facility]

## 2016-11-12 NOTE — Telephone Encounter (Signed)
Relation to pt: self  Call back [phone]  Reason for call:  Patient requesting 90 day supply losartan (COZAAR) 50 MG tablet and hydrochlorothiazide (MICROZIDE) 12.5 MG capsule please send to mail order   [facility] - [place], [place] - [address] [phone] (Phone) [phone] (Fax)   Patient requesting few pills please send to retail to hold patient over until mail order is received  [id number] [place], [place] - [address] [phone] (Phone) [phone] (Fax)

## 2016-11-15 ENCOUNTER — Ambulatory Visit (INDEPENDENT_AMBULATORY_CARE_PROVIDER_SITE_OTHER): Payer: Medicare Other | Admitting: Internal Medicine

## 2016-11-15 ENCOUNTER — Encounter: Payer: Self-pay | Admitting: Internal Medicine

## 2016-11-15 VITALS — BP 120/72 | HR 58 | Wt 214.0 lb

## 2016-11-15 DIAGNOSIS — Z8639 Personal history of other endocrine, nutritional and metabolic disease: Secondary | ICD-10-CM | POA: Diagnosis not present

## 2016-11-15 DIAGNOSIS — E041 Nontoxic single thyroid nodule: Secondary | ICD-10-CM | POA: Diagnosis not present

## 2016-11-15 DIAGNOSIS — E039 Hypothyroidism, unspecified: Secondary | ICD-10-CM

## 2016-11-15 LAB — BASIC METABOLIC PANEL WITH GFR
BUN: 12 mg/dL (ref 7–25)
CALCIUM: 9.3 mg/dL (ref 8.6–10.4)
CO2: 27 mmol/L (ref 20–31)
CREATININE: 0.76 mg/dL (ref 0.50–0.99)
Chloride: 102 mmol/L (ref 98–110)
GFR, Est Non African American: 80 mL/min (ref >=60)
Glucose, Bld: 90 mg/dL (ref 65–99)
Potassium: 4.3 mmol/L (ref 3.5–5.3)
Sodium: 138 mmol/L (ref 135–146)

## 2016-11-15 LAB — VITAMIN D 25 HYDROXY (VIT D DEFICIENCY, FRACTURES): VITD: 43.09 ng/mL (ref 30.00–100.00)

## 2016-11-15 LAB — TSH: TSH: 1.13 u[IU]/mL (ref 0.35–4.50)

## 2016-11-15 LAB — T4, FREE: FREE T4: 0.88 ng/dL (ref 0.60–1.60)

## 2016-11-15 NOTE — Patient Instructions (Signed)
Please stop at the lab.  Continue Vitamin D 2000 units daily.  Continue Levothyroxine 100 mcg 6/7 days and 50 mcg 1/7 days.  Take the thyroid hormone every day, with water, >30 minutes before breakfast, separated by >4 hours from acid reflux medications, calcium, iron, multivitamins.  Please return in 1 year.  

## 2016-11-15 NOTE — Progress Notes (Signed)
Subjective:     Patient ID: Gail Fuentes, female   DOB: 03-07-1946, 71 y.o.   MRN: [id number]   y.o.   MRN: [id number]  HPI [name] is a pleasant 71 y/o returning for f/u for dx hyperparathyroidism, s/p parathyroidectomy, hypothyroidism, and a R thyroid nodule. Last visit 1 year ago.  Reviewed and addended hx: Pt had a thyroid ultrasound (10/08/2012) showing only a right thyroid nodule, 7 x 7 x 7 mm, with internal calcifications. On a subsequent thyroid U/S >> this nodule has disappeared.  A technetium sestamibi scan showed a left inferior parathyroid adenoma and a CT scan confirmed this. However, during surgery, it was found that she had a left superior parathyroid adenoma, which was excised - left superior parathyroidectomy with Dr Gerrit Friends on 02/13/2013.   She feels much better after the surgery and her PTH + Ca remain normal.  Reviewed previous PTH and Ca levels - normal: Lab Results  Component Value Date   PTH 35 11/15/2015   PTH Comment 11/15/2015   PTH 41 11/15/2014   PTH Comment 11/15/2014   PTH 44 03/16/2014   PTH Comment 03/16/2014   CALCIUM 9.7 11/15/2015   CALCIUM 9.5 07/18/2015   CALCIUM 9.5 11/15/2014   CALCIUM 9.4 09/24/2014   CALCIUM 8.8 03/16/2014   CALCIUM 9.3 09/03/2013   CALCIUM 9.2 04/20/2013   CALCIUM 9.1 03/16/2013   CALCIUM 9.1 02/27/2013   CALCIUM 10.7 (H) 02/10/2013   CALCIUM 10.2 10/20/2012   CALCIUM 10.5 10/02/2012   CALCIUM 10.9 (H) 10/01/2012   CALCIUM 10.7 (H) 12/27/2011   CALCIUM 10.5 09/21/2010   She does not have a history of kidney stones, had an osteoporotic T score only at UD radius per DEXA in 2014, no fractures.  She has a h/o vit D def >> was on Ergo 50,000 IU once a week for 1-2 years. Now on 2000 units vit D daily.  Latest vit D level was normal: Lab Results  Component Value Date   VD25OH 45.13 11/15/2015   VD25OH 34.16 07/18/2015   VD25OH 30.52 10/12/2014   VD25OH 19.76 (L) 03/16/2014   VD25OH 35 09/03/2013   VD25OH 35 11/04/2012   VD25OH 59  12/27/2011   VD25OH 48 03/30/2011   VD25OH 50 03/06/2010   VD25OH 37 07/04/2009   She has a long h/o hypothyroidism.   She takes 100 mcg Synthroid DAW 6/7 days and 50 mcg 1/7 days: - in am, ~7:30 am - fasting - at least 30 min from b'fast - no Ca, Fe, MVI - + PPI later in the day (11 am), every other  - not on Biotin  Latest TSH levels - normal: Lab Results  Component Value Date   TSH 1.84 07/18/2015   TSH 1.27 11/15/2014   TSH 0.95 09/21/2014   TSH 0.32 (L) 07/13/2014   TSH 0.28 (L) 05/19/2014   FREET4 0.92 11/15/2014   FREET4 0.77 09/21/2014   FREET4 0.95 07/13/2014   FREET4 0.95 05/19/2014   FREET4 0.82 03/16/2014   Review of Systems Constitutional: + weight gain,+ fatigue, no subjective hyperthermia, no subjective hypothermia Eyes: + blurry vision, no xerophthalmia ENT: no sore throat, no nodules palpated in throat, no dysphagia, no odynophagia, no hoarseness Cardiovascular: no CP/no SOB/+ palpitations/+ leg swelling Respiratory: no cough/no SOB/no wheezing Gastrointestinal: no N/no V/no D/no C/+ acid reflux Musculoskeletal: no muscle aches/no joint aches Skin: no rashes, no hair loss Neurological: no tremors/no numbness/no tingling/no dizziness  I reviewed pt's medications, allergies, PMH, social hx, family hx, and changes were documented in the history  of present illness. Otherwise, unchanged from my initial visit note.  Objective:   Physical Exam BP 120/72 (BP Location: Left Arm, Patient Position: Sitting)   Pulse (!) 58   Wt 214 lb (97.1 kg)   SpO2 95%   BMI 35.61 kg/m  Body mass index is 35.61 kg/m. Wt Readings from Last 3 Encounters:  11/15/16 214 lb (97.1 kg)  06/01/16 213 lb (96.6 kg)  05/18/16 215 lb 12 oz (97.9 kg)   Constitutional: overweight, in NAD Eyes: PERRLA, EOMI, no exophthalmos ENT: moist mucous membranes, no thyromegaly, no cervical lymphadenopathy Cardiovascular: RRR, No MRG Respiratory: CTA B Gastrointestinal: abdomen soft, NT,  ND, BS+ Musculoskeletal: no deformities, strength intact in all 4 Skin: moist, warm, no rashes Neurological: no tremor with outstretched hands, DTR normal in all 4  Assessment:     1. Primary hyperparathyroidism - status post left superior parathyroidectomy 02/13/2013, by Dr. Gerrit Friends - calcium < 1 mg/dl above normal before the surgery - elevated PTH (133, 238) - normal vit D  - elevated 1,25 vitamin D - no renal failure - osteopenia  - no h/o kidney stones - DEXA 01/23/2010 ([facility]):  L1-4 T score -2.0  Left femoral neck T score -1.9  Right femoral neck T score -1.4  10 year any fracture risk 13.8%, 10 year hip fracture risk 2.9% - CT neck 01/14/2013:    Focal nodular appearance along the posterior aspect of the left lobe of thyroid corresponds with a sestamibi exam and suggests a left lower parathyroid adenoma.   No significant nodular tissue adjacent to the submandibular glands.   Sub centimeter lymph nodes are likely within normal limits.  Focal calcification is noted within the right lobe of the thyroid.  - Tc sestamibi scan 12/29/2012: suspicious for left thyroid lobe region parathyroid  adenoma. Questionable abnormal focus also in the right submandibular space - DEXA 11/14/2012 ([facility]):  L1-4 T score -2.0 >> -2.1 (but high SD, L1 T score is -2.8)  Left femoral neck T score -1.9 >> -1.9  Right femoral neck T score -1.4 >> -1.7  33% distal radius: -1.5  But worrisome that UD radius -3.1 (good evaluation for trabecular bone) - UCa 300 mg/24h (11/06/2012)   She had parathyroidectomy with Dr Gerrit Friends on 02/13/2013.   2. Thyroid nodule - thyroid ultrasound 10/08/2012: Thyroid echotexture is heterogeneous.   Right lobe: Measures 3 x 1.2 x 1.1 cm.   Left lobe: Measures 2.9 x 1.9 x 0.9 cm.   Isthmus: Measures 0.4 cm.   Focal lesions: Within the lower pole of the right lobe of thyroid gland there is a hypoechoic nodule containing coarsened calcifications. This  measures 7 x 7 x 7 mm.   No lymphadenopathy identified. - thyroid U/S 03/22/2014:  Right thyroid lobe: 3.4 x 1.1 x 1.0 cm. Stable to slightly smaller focal area of nodularity with dystrophic shadowing calcifications measures approximately 0.6 cm in diameter. The thyroid parenchyma is very heterogeneous and shows no abnormal vascularity.  Left thyroid lobe: 2.9 x 1.1 x 1.1 cm. No nodules visualized. Heterogeneous parenchyma noted without abnormal vascularity. The left lobe is small in size.  Isthmus Thickness: 0.9 cm. No nodules visualized.  Lymphadenopathy None visualized.  3. Hypothyroidism  Plan:     1. Primary Hyperparathyroidism: Pt with h/o PHPTH, with minimally elevated calcium but higher PTH lvels in the past >> now s/p parathyroidectomy in 2014 by Dr Gerrit Friends >> 0.5 g adenoma resectedcalcium and PTH levels normalized - check annual calcium levels from now on,  no PTH checks necessary  2. R thyroid nodule - she had a R 7 mm thyroid nodule with internal calcif >> stable or even smaller per last U/S - may need a repeat U/S at next visit  3. Hypothyroidism - latest thyroid labs reviewed with pt >> normal  - she continues on Synthroid DAW 100 mcg 6/7 and 50 mcg 1/7 daysmcg daily - pt feels good on this dose. - we discussed about taking the thyroid hormone every day, with water, >30 minutes before breakfast, separated by >4 hours from acid reflux medications, calcium, iron, multivitamins. Pt. is taking it correctly - will check thyroid tests today: TSH and fT4 - If labs are abnormal, she will need to return for repeat TFTs in 1.5 months - OTW, Return in about 1 year (around 11/15/2017).  4. Vit D deficiency - continues vit D 2000 units daily - will continue this dose and check level today  Needs refills LT4 DAW.  Component     Latest Ref Rng & Units 11/15/2016  Sodium     135 - 146 mmol/L 138  Potassium     3.5 - 5.3 mmol/L 4.3  Chloride     98 - 110 mmol/L 102  CO2      20 - 31 mmol/L 27  Glucose     65 - 99 mg/dL 90  BUN     7 - 25 mg/dL 12  Creatinine     6.96 - 0.99 mg/dL 2.95  Calcium     8.6 - 10.4 mg/dL 9.3  GFR, Est Non African American     >=60 mL/min 80  GFR, Est African American     >=60 mL/min >89  TSH     0.35 - 4.50 uIU/mL 1.13  T4,Free(Direct)     0.60 - 1.60 ng/dL 2.84  VITD     13.24 - 100.00 ng/mL 43.09  All labs are normal.  Carlus Pavlov, MD PhD [facility] Endocrinology

## 2016-11-16 MED ORDER — SYNTHROID 100 MCG PO TABS: ORAL_TABLET | ORAL | 3 refills | Status: DC

## 2016-11-19 NOTE — Progress Notes (Addendum)
[facility] [address] [place], [place] [address] [phone]  Date:  11/21/2016   Name:  Gail Fuentes   DOB:  08-26-1945   MRN:  [id number]  PCP:  Pearline Cables, MD    Chief Complaint: Annual Exam (Pt here for CPE. Last PAP 09/09/2014. Last Colonoscopy: 11/2014 an last Mammogram: 02/2016. )   History of Present Illness:  Gail Fuentes is a 71 y.o. very pleasant female patient who presents with the following:  Last seen by myself about 1 year ago for a CPE: Here today for a CPE- history of HTN, hyperglycemia, hypothyroid, diverticulitis.   She was recently ill, was treated with azithromcyin and doxycycline. She is feeling better except for hoarse voice.   Pt is fasting for labs today. Last PAP: May 2017, Last Mammogram: 02/2015, Last colonoscopy: 2016. Pt has declined zoster vaccine at this time.  Pt of Dr. Elvera Lennox for endocrinology and she also sees OBG- Dr. Jennette Kettle.   She is taking prozac 40 and xanax as needed- she takes xanax a few times a month at most. She has been on prozac for a very long time, the dose has not changed in years. She continues to note a low level sadness and dissatisfaction with life in general. She is not severely depressed or suicidal but just feels like there is not much to look forward to in life.  She would like to change this," I want to start seeing the glass as half full."  She has tried counseling in the past but did not feel it helped that much  Recent labs in March were normal- CMP, CBC, TSH, calcium, PTH  Last cholesterol panel a year ago BP controlled with HCTZ and cozaar.  She notes that at home her SBP is 130. DBP 70- 80.  No SE of medications  At her last visit she had wanted to taper off her prozac. However she ended up continuing to take it, but just QOD mammo is UTD S/p hysterectomy Tetanus, pneumonia vaccines are UTD Shingles vaccine: pt is not sure if she wants  to get this, discussed iwht her dexa scan 2014 Hep C testing is done Colonoscopy 2016  [facility]: no entries  She will see Dr. Jennette Kettle soon, she saw Elvera Lennox last week She is taking her prozac QOD, 40 mg.   A few months ago she noted some unusual and scary dreams, none for the last couple of weeks However she notes that she has felt more energetic and up over the last few months. She has felt more like doing things, is more active in her garden and has been entertaining friends   Her family is doing well  Lab Results  Component Value Date   TSH 1.13 11/15/2016   She is fasting today for labs - she had a BMP and TSH last week, also vitamin D About 3 weeks ago she notes an itchy spot on her left index finger. It then seemed to turn into a pustule- this lasted a few days and then drained.  This has healed, but she notes that her finger joints can be stiff and they are bigger than in the past. No hip or knee pain however   Patient Active Problem List   Diagnosis Date Noted  . History of hyperparathyroidism 08/24/2014  . Obesity (BMI 30-39.9) 10/08/2013  . HTN (hypertension) 07/13/2013  . Elevated blood pressure reading without diagnosis of hypertension  06/29/2013  . Osteopenia 11/26/2012  . Acute iritis of both eyes 10/17/2012  . Thyroid nodule 10/17/2012  . Cardiomegaly 02/20/2012  . DISTURBANCES OF SENSATION OF SMELL AND TASTE 04/19/2010  . Vitamin D deficiency 12/24/2008  . ANXIETY 12/24/2008  . DEPRESSION 12/24/2008  . BENIGN PAROXYSMAL POSITIONAL VERTIGO 12/24/2008  . HYPERGLYCEMIA 12/24/2008  . Nonspecific abnormal electrocardiogram (ECG) (EKG) 12/24/2008  . Hyperlipidemia 07/23/2007  . Hypothyroidism 07/22/2007  . GERD 07/22/2007  . DIVERTICULITIS, HX OF 07/22/2007    Past Medical History:  Diagnosis Date  . Abnormal EKG    per patient- states evaluated by Dr Daleen Squibb in the past  . Allergy    sneezing  late Fall  . Anemia 01/23/12   H/H 11.7/35  . Anxiety state, unspecified    . Arthritis   . Benign paroxysmal positional vertigo   . Depression   . Depressive disorder, not elsewhere classified   . Diverticulosis   . Esophageal reflux   . Esophageal stricture   . Gallstones   . Heart murmur   . Hiatal hernia   . History of hyperparathyroidism   . History of UTI   . HTN (hypertension)   . Hyperlipidemia   . Other abnormal glucose   . Personal history of other diseases of digestive system    12-15 since 1990, no recurrence 2006  . PONV (postoperative nausea and vomiting)   . Unspecified hypothyroidism   . Vitamin D deficiency     Past Surgical History:  Procedure Laterality Date  . CESAREAN SECTION  1977  . CHOLECYSTECTOMY  2004  . COLONOSCOPY  12/2003   Tics  . DENTAL SURGERY  1962, 2012   Age 65-Abscessed Teeth Extracted   . PARATHYROIDECTOMY Left 02/13/2013   Procedure: LEFT INFERIOR PARATHYROIDECTOMY with frozen section ;  Surgeon: Velora Heckler, MD;  Location: [facility];  Service: General;  Laterality: Left;  . TOTAL ABDOMINAL HYSTERECTOMY W/ BILATERAL SALPINGOOPHORECTOMY  2005   Ovarian Cyst    Social History  Substance Use Topics  . Smoking status: Former Smoker    Quit date: 05/07/1973  . Smokeless tobacco: Never Used     Comment: Smoked 1968-1975 up to 2 ppd  . Alcohol use 8.4 oz/week    14 Standard drinks or equivalent per week     Comment: 2 nightly    Family History  Problem Relation Age of Onset  . Coronary artery disease Father   . Heart attack Father 39  . COPD Father   . Diabetes Mother   . Dysrhythmia Mother   . Depression Mother   . COPD Mother   . Prostate cancer Brother   . Other Brother        Wegener's in sinus; [facility]  . Hypertension Brother   . Prostate cancer Brother   . Cancer Brother        Soft tissue in the face/temple  . Stroke Maternal Aunt        >65  . Stroke Maternal Grandmother        >65  . Stroke Maternal Grandfather 45  . Colon polyps Mother   . Kidney disease Mother     Allergies   Allergen Reactions  . Augmentin [Amoxicillin-Pot Clavulanate] Rash    Medication list has been reviewed and updated.  Current Outpatient Prescriptions on File Prior to Visit  Medication Sig Dispense Refill  . albuterol (PROVENTIL HFA;VENTOLIN HFA) 108 (90 Base) MCG/ACT inhaler Inhale 2 puffs into the lungs every 6 (six) hours as  needed for wheezing or shortness of breath. 1 Inhaler 2  . ALPRAZolam (XANAX) 0.5 MG tablet Take 0.5 mg by mouth 3 (three) times daily as needed for anxiety.     . Cholecalciferol (VITAMIN D3) 2000 UNITS TABS Take 1 tablet by mouth daily.    . cyanocobalamin 100 MCG tablet Take 100 mcg by mouth daily.    . fexofenadine (ALLEGRA) 180 MG tablet Take 180 mg by mouth as needed for allergies. Reported on 07/18/2015    . FLUoxetine (PROZAC) 40 MG capsule Take 40 mg by mouth every morning.     . fluticasone (FLONASE) 50 MCG/ACT nasal spray Place 1 spray into both nostrils 2 (two) times daily as needed for rhinitis. 16 g 3  . fluticasone (FLOVENT HFA) 110 MCG/ACT inhaler Inhale 2 puffs into the lungs 2 (two) times daily. 1 Inhaler 2  . hydrochlorothiazide (MICROZIDE) 12.5 MG capsule Take 1 capsule (12.5 mg total) by mouth daily. 90 capsule 0  . losartan (COZAAR) 50 MG tablet Take 1 tablet (50 mg total) by mouth daily. 90 tablet 0  . omeprazole (PRILOSEC) 20 MG capsule TAKE 1 CAPSULE DAILY 90 capsule 3  . SYNTHROID 100 MCG tablet TAKE 1 TABLET DAILY AND TAKE ONE-HALF (1/2) TABLET ON SUNDAY 90 tablet 3   No current facility-administered medications on file prior to visit.     Review of Systems:  As per HPI- otherwise negative.   Physical Examination: Vitals:   11/21/16 0835  BP: 128/82  Pulse: 63  Temp: 97.6 F (36.4 C)   Vitals:   11/21/16 0835  Weight: 213 lb 9.6 oz (96.9 kg)  Height: 5\' 5"  (1.651 m)   Body mass index is 35.54 kg/m. Ideal Body Weight: Weight in (lb) to have BMI = 25: 149.9  GEN: WDWN, NAD, Non-toxic, A & O x 3, overweight, otherwise  looks well HEENT: Atraumatic, Normocephalic. Neck supple. No masses, No LAD.  eustachian tube dysfunctio Ears and Nose: No external deformity. CV: RRR, No M/G/R. No JVD. No thrill. No extra heart sounds. PULM: CTA B, no wheezes, crackles, rhonchi. No retractions. No resp. distress. No accessory muscle use. ABD: S, NT, ND. No rebound. No HSM. EXTR: No c/c/e NEURO Normal gait.  PSYCH: Normally interactive. Conversant. Not depressed or anxious appearing.  Calm demeanor.  Mild thickening of the IP joints of the hands c/w OA   Assessment and Plan: Physical exam  Dysthymia  Mixed hyperlipidemia - Plan: Lipid panel  Primary osteoarthritis of both hands  Medication monitoring encounter - Plan: Hepatic Function Panel  Estrogen deficiency - Plan: DG Bone Density  Essential hypertension - Plan: hydrochlorothiazide (MICROZIDE) 12.5 MG capsule, losartan (COZAAR) 50 MG tablet  Encounter for Medicare annual wellness exam  Here today for a CPE Her BP is under good control- refilled her medications today Labs pending and ordered dexa scan for her today Her dysthymia is under ok control with current prozac regimen.  She will let me know if not doing ok in this regard   Signed Abbe Amsterdam, MD Received her labs   Results for orders placed or performed in visit on 11/21/16  Lipid panel  Result Value Ref Range   Cholesterol 240 (H) 0 - 200 mg/dL   Triglycerides 38.1 0.0 - 149.0 mg/dL   HDL 77.11 >65.79 mg/dL   VLDL 03.8 0.0 - 33.3 mg/dL   LDL Cholesterol 832 (H) 0 - 99 mg/dL   Total CHOL/HDL Ratio 3    NonHDL 166.37   Hepatic Function  Panel  Result Value Ref Range   Total Bilirubin 0.6 0.2 - 1.2 mg/dL   Bilirubin, Direct 0.1 0.0 - 0.3 mg/dL   Alkaline Phosphatase 73 39 - 117 U/L   AST 29 0 - 37 U/L   ALT 24 0 - 35 U/L   Total Protein 6.8 6.0 - 8.3 g/dL   Albumin 4.1 3.5 - 5.2 g/dL

## 2016-11-20 NOTE — Progress Notes (Signed)
Subjective:   Gail Fuentes is a 71 y.o. female who presents for Medicare Annual (Subsequent) preventive examination.  Review of Systems:  No ROS.  Medicare Wellness Visit. Additional risk factors are reflected in the social history.  Cardiac Risk Factors include: dyslipidemia;hypertension;advanced age (>79men, >73 women);sedentary lifestyle;obesity (BMI >30kg/m2) Sleep patterns: Reports difficulty staying asleep. Sleeps about 7 hrs per night. Naps occasionally. Doesn't really feel rested.  Home Safety/Smoke Alarms: Feels safe in home. Smoke alarms in place.  Living environment; residence and Firearm Safety: Lives with husband in 2 story home. Master on 1st.  Seat Belt Safety/Bike Helmet: Wears seat belt.   Counseling:   Eye Exam- Wearing contacts. Dr.Gould yearly. Dental- Dr.Pool every 6 months.   Female:   Pap- Hysterectomy --Dr.Neal for check ups.      Mammo- Last 02/24/16:   BI-RADS 1-NEGATIVE   Dexa scan- Last 11/20/12:   No result on file.      CCS- Last 11/19/14:   Mild diverticulosis. Recall 10 yrs    Objective:     Vitals: BP 128/82   Pulse 63   Temp 97.6 F (36.4 C) (Oral)   Ht 5\' 5"  (1.651 m)   Wt 213 lb 9.6 oz (96.9 kg)   SpO2 98%   BMI 35.54 kg/m   Body mass index is 35.54 kg/m.   Tobacco History  Smoking Status  . Former Smoker  . Quit date: 05/07/1973  Smokeless Tobacco  . Never Used    Comment: Smoked 1968-1975 up to 2 ppd     Counseling given: Not Answered   Past Medical History:  Diagnosis Date  . Abnormal EKG    per patient- states evaluated by Dr Daleen Squibb in the past  . Allergy    sneezing  late Fall  . Anemia 01/23/12   H/H 11.7/35  . Anxiety state, unspecified   . Arthritis   . Benign paroxysmal positional vertigo   . Depression   . Depressive disorder, not elsewhere classified   . Diverticulosis   . Esophageal reflux   . Esophageal stricture   . Gallstones   . Heart murmur   . Hiatal hernia   . History of hyperparathyroidism    . History of UTI   . HTN (hypertension)   . Hyperlipidemia   . Other abnormal glucose   . Personal history of other diseases of digestive system    12-15 since 1990, no recurrence 2006  . PONV (postoperative nausea and vomiting)   . Unspecified hypothyroidism   . Vitamin D deficiency    Past Surgical History:  Procedure Laterality Date  . CESAREAN SECTION  1977  . CHOLECYSTECTOMY  2004  . COLONOSCOPY  12/2003   Tics  . DENTAL SURGERY  1962, 2012   Age 69-Abscessed Teeth Extracted   . PARATHYROIDECTOMY Left 02/13/2013   Procedure: LEFT INFERIOR PARATHYROIDECTOMY with frozen section ;  Surgeon: Velora Heckler, MD;  Location: [facility];  Service: General;  Laterality: Left;  . TOTAL ABDOMINAL HYSTERECTOMY W/ BILATERAL SALPINGOOPHORECTOMY  2005   Ovarian Cyst   Family History  Problem Relation Age of Onset  . Coronary artery disease Father   . Heart attack Father 45  . COPD Father   . Diabetes Mother   . Dysrhythmia Mother   . Depression Mother   . COPD Mother   . Colon polyps Mother   . Kidney disease Mother   . Prostate cancer Brother   . Other Brother  Wegener's in sinus; [facility]  . Heart disease Brother   . Arthritis Brother   . Hypertension Brother   . Prostate cancer Brother   . Cancer Brother        Soft tissue in the face/temple  . Stroke Maternal Aunt        >65  . Stroke Maternal Grandmother        >65  . Stroke Maternal Grandfather 7   History  Sexual Activity  . Sexual activity: Yes    Outpatient Encounter Prescriptions as of 11/21/2016  Medication Sig  . albuterol (PROVENTIL HFA;VENTOLIN HFA) 108 (90 Base) MCG/ACT inhaler Inhale 2 puffs into the lungs every 6 (six) hours as needed for wheezing or shortness of breath.  . ALPRAZolam (XANAX) 0.5 MG tablet Take 0.5 mg by mouth 3 (three) times daily as needed for anxiety.   . Cholecalciferol (VITAMIN D3) 2000 UNITS TABS Take 1 tablet by mouth daily.  . cyanocobalamin 100 MCG tablet Take 100 mcg by  mouth daily.  . fexofenadine (ALLEGRA) 180 MG tablet Take 180 mg by mouth as needed for allergies. Reported on 07/18/2015  . FLUoxetine (PROZAC) 40 MG capsule Take 40 mg by mouth every morning.   . fluticasone (FLONASE) 50 MCG/ACT nasal spray Place 1 spray into both nostrils 2 (two) times daily as needed for rhinitis.  . fluticasone (FLOVENT HFA) 110 MCG/ACT inhaler Inhale 2 puffs into the lungs 2 (two) times daily.  . hydrochlorothiazide (MICROZIDE) 12.5 MG capsule Take 1 capsule (12.5 mg total) by mouth daily.  [name] losartan (COZAAR) 50 MG tablet Take 1 tablet (50 mg total) by mouth daily.  [name] omeprazole (PRILOSEC) 20 MG capsule TAKE 1 CAPSULE DAILY  . SYNTHROID 100 MCG tablet TAKE 1 TABLET DAILY AND TAKE ONE-HALF (1/2) TABLET ON SUNDAY  . [DISCONTINUED] hydrochlorothiazide (MICROZIDE) 12.5 MG capsule Take 1 capsule (12.5 mg total) by mouth daily.  . [DISCONTINUED] losartan (COZAAR) 50 MG tablet Take 1 tablet (50 mg total) by mouth daily.   No facility-administered encounter medications on file as of 11/21/2016.     Activities of Daily Living In your present state of health, do you have any difficulty performing the following activities: 11/21/2016  Hearing? N  Vision? N  Difficulty concentrating or making decisions? N  Walking or climbing stairs? N  Dressing or bathing? N  Doing errands, shopping? N  Preparing Food and eating ? N  Using the Toilet? N  In the past six months, have you accidently leaked urine? Y  Do you have problems with loss of bowel control? N  Managing your Medications? N  Managing your Finances? N  Housekeeping or managing your Housekeeping? N  Some recent data might be hidden    Patient Care Team: Copland, Gwenlyn Found, MD as PCP - General (Family Medicine)    Assessment:    Physical assessment deferred to PCP.  Exercise Activities and Dietary recommendations Current Exercise Habits: The patient does not participate in regular exercise at present, Exercise  limited by: None identified   Diet (meal preparation, eat out, water intake, caffeinated beverages, dairy products, fruits and vegetables):  Breakfast: eggs and toast. Or cereal Lunch: cheese and crackers. Dinner:  Meat and 2 vegetables Only drinks 2 glasses of water per day    Goals    . Would like to extablish a group of female friends      Fall Risk Fall Risk  11/21/2016 11/21/2016 11/17/2015 07/18/2015 04/01/2013  Falls in the past year? No No No  No No   Depression Screen PHQ 2/9 Scores 11/21/2016 11/21/2016 11/17/2015 07/18/2015  PHQ - 2 Score 0 0 0 3  PHQ- 9 Score - - - 14     Cognitive Function Ad8 score reviewed for issues:  Issues making decisions:no  Less interest in hobbies / activities:no  Repeats questions, stories (family complaining):no  Trouble using ordinary gadgets (microwave, computer, phone):no  Forgets the month or year: no  Mismanaging finances: no  Remembering appts:no  Daily problems with thinking and/or memory:no Ad8 score is=0         Immunization History  Administered Date(s) Administered  . Influenza Split 03/11/2012  . Influenza Whole 02/20/2008, 03/06/2010  . Influenza, High Dose Seasonal PF 03/17/2013  . Influenza,inj,Quad PF,36+ Mos 03/04/2014  . Influenza-Unspecified 03/08/2015, 03/06/2016  . Pneumococcal Conjugate-13 07/18/2015  . Pneumococcal Polysaccharide-23 03/06/2013  . Td 07/18/2015   Screening Tests Health Maintenance  Topic Date Due  . INFLUENZA VACCINE  12/05/2016  . MAMMOGRAM  02/26/2018  . COLONOSCOPY  11/18/2024  . TETANUS/TDAP  07/17/2025  . DEXA SCAN  Completed  . Hepatitis C Screening  Completed  . PNA vac Low Risk Adult  Completed      Plan:   Follow up with PCP as directed.  Continue to eat heart healthy diet (full of fruits, vegetables, whole grains, lean protein, water--limit salt, fat, and sugar intake) and increase physical activity as tolerated.  Continue doing brain stimulating activities  (puzzles, reading, adult coloring books, staying active) to keep memory sharp.    I have personally reviewed and noted the following in the patient's chart:   . Medical and social history . Use of alcohol, tobacco or illicit drugs  . Current medications and supplements . Functional ability and status . Nutritional status . Physical activity . Advanced directives . List of other physicians . Hospitalizations, surgeries, and ER visits in previous 12 months . Vitals . Screenings to include cognitive, depression, and falls . Referrals and appointments  In addition, I have reviewed and discussed with patient certain preventive protocols, quality metrics, and best practice recommendations. A written personalized care plan for preventive services as well as general preventive health recommendations were provided to patient.     Mady Haagensen [place], [place]  11/21/2016

## 2016-11-21 ENCOUNTER — Ambulatory Visit (INDEPENDENT_AMBULATORY_CARE_PROVIDER_SITE_OTHER): Payer: Medicare Other | Admitting: Family Medicine

## 2016-11-21 ENCOUNTER — Encounter: Payer: Self-pay | Admitting: Family Medicine

## 2016-11-21 VITALS — BP 128/82 | HR 63 | Temp 97.6°F | Ht 65.0 in | Wt 213.6 lb

## 2016-11-21 DIAGNOSIS — Z0000 Encounter for general adult medical examination without abnormal findings: Secondary | ICD-10-CM

## 2016-11-21 DIAGNOSIS — Z5181 Encounter for therapeutic drug level monitoring: Secondary | ICD-10-CM

## 2016-11-21 DIAGNOSIS — E2839 Other primary ovarian failure: Secondary | ICD-10-CM

## 2016-11-21 DIAGNOSIS — I10 Essential (primary) hypertension: Secondary | ICD-10-CM

## 2016-11-21 DIAGNOSIS — M19042 Primary osteoarthritis, left hand: Secondary | ICD-10-CM | POA: Diagnosis not present

## 2016-11-21 DIAGNOSIS — M19041 Primary osteoarthritis, right hand: Secondary | ICD-10-CM | POA: Diagnosis not present

## 2016-11-21 DIAGNOSIS — F341 Dysthymic disorder: Secondary | ICD-10-CM

## 2016-11-21 DIAGNOSIS — E782 Mixed hyperlipidemia: Secondary | ICD-10-CM

## 2016-11-21 LAB — HEPATIC FUNCTION PANEL
ALK PHOS: 73 U/L (ref 39–117)
ALT: 24 U/L (ref 0–35)
AST: 29 U/L (ref 0–37)
Albumin: 4.1 g/dL (ref 3.5–5.2)
BILIRUBIN DIRECT: 0.1 mg/dL (ref 0.0–0.3)
TOTAL PROTEIN: 6.8 g/dL (ref 6.0–8.3)
Total Bilirubin: 0.6 mg/dL (ref 0.2–1.2)

## 2016-11-21 LAB — LIPID PANEL
CHOL/HDL RATIO: 3
CHOLESTEROL: 240 mg/dL — AB (ref 0–200)
HDL: 74 mg/dL (ref >39.00)
LDL Cholesterol: 151 mg/dL — ABNORMAL HIGH (ref 0–99)
NonHDL: 166.37
TRIGLYCERIDES: 78 mg/dL (ref 0.0–149.0)
VLDL: 15.6 mg/dL (ref 0.0–40.0)

## 2016-11-21 MED ORDER — HYDROCHLOROTHIAZIDE 12.5 MG PO CAPS: 12.5000 mg | ORAL_CAPSULE | Freq: Every day | ORAL | 3 refills | Status: DC

## 2016-11-21 MED ORDER — LOSARTAN POTASSIUM 50 MG PO TABS: 50.0000 mg | ORAL_TABLET | Freq: Every day | ORAL | 3 refills | Status: DC

## 2016-11-21 NOTE — Progress Notes (Signed)
I have reviewed the MWE note by Ms. Britt and agree with her documentation  

## 2016-11-21 NOTE — Patient Instructions (Addendum)
It was nice to see you today!  Take care and I will be in touch with your labs asap.  Please let me know if your mood is not ok/ if you start to get sadder as the weather changes   You do have some arthritis in your hands- the best thing you can do here is continue to use your hands and keep them limber.  If you get to the point of needing medication, tylenol is what I would suggest that you try first.  Let me know if this is getting to be overly bothersome  I will set you up for a periodic bone density exam- [facility] Your BP looks great today I would suggest that you get the shingrix vaccine at your convenience.  This is available at many major drug stores as well.      Gail Fuentes , Thank you for taking time to come for your Medicare Wellness Visit. I appreciate your ongoing commitment to your health goals. Please review the following plan we discussed and let me know if I can assist you in the future.   These are the goals we discussed: Goals    . Would like to extablish a group of female friends       This is a list of the screening recommended for you and due dates:  Health Maintenance  Topic Date Due  . Flu Shot  12/05/2016  . Mammogram  02/26/2018  . Colon Cancer Screening  11/18/2024  . Tetanus Vaccine  07/17/2025  . DEXA scan (bone density measurement)  Completed  .  Hepatitis C: One time screening is recommended by Center for Disease Control  (CDC) for  adults born from [birth year] through 1964.   Completed  . Pneumonia vaccines  Completed   Continue to eat heart healthy diet (full of fruits, vegetables, whole grains, lean protein, water--limit salt, fat, and sugar intake) and increase physical activity as tolerated.  Continue doing brain stimulating activities (puzzles, reading, adult coloring books, staying active) to keep memory sharp.   Health Maintenance for Postmenopausal Women Menopause is a normal process in which your reproductive ability  comes to an end. This process happens gradually over a span of months to years, usually between the ages of 58 and 57. Menopause is complete when you have missed 12 consecutive menstrual periods. It is important to talk with your health care provider about some of the most common conditions that affect postmenopausal women, such as heart disease, cancer, and bone loss (osteoporosis). Adopting a healthy lifestyle and getting preventive care can help to promote your health and wellness. Those actions can also lower your chances of developing some of these common conditions. What should I know about menopause? During menopause, you may experience a number of symptoms, such as:  Moderate-to-severe hot flashes.  Night sweats.  Decrease in sex drive.  Mood swings.  Headaches.  Tiredness.  Irritability.  Memory problems.  Insomnia.  Choosing to treat or not to treat menopausal changes is an individual decision that you make with your health care provider. What should I know about hormone replacement therapy and supplements? Hormone therapy products are effective for treating symptoms that are associated with menopause, such as hot flashes and night sweats. Hormone replacement carries certain risks, especially as you become older. If you are thinking about using estrogen or estrogen with progestin treatments, discuss the benefits and risks with your health care provider. What should I know about  heart disease and stroke? Heart disease, heart attack, and stroke become more likely as you age. This may be due, in part, to the hormonal changes that your body experiences during menopause. These can affect how your body processes dietary fats, triglycerides, and cholesterol. Heart attack and stroke are both medical emergencies. There are many things that you can do to help prevent heart disease and stroke:  Have your blood pressure checked at least every 1-2 years. High blood pressure causes heart  disease and increases the risk of stroke.  If you are 39-40 years old, ask your health care provider if you should take aspirin to prevent a heart attack or a stroke.  Do not use any tobacco products, including cigarettes, chewing tobacco, or electronic cigarettes. If you need help quitting, ask your health care provider.  It is important to eat a healthy diet and maintain a healthy weight. ? Be sure to include plenty of vegetables, fruits, low-fat dairy products, and lean protein. ? Avoid eating foods that are high in solid fats, added sugars, or salt (sodium).  Get regular exercise. This is one of the most important things that you can do for your health. ? Try to exercise for at least 150 minutes each week. The type of exercise that you do should increase your heart rate and make you sweat. This is known as moderate-intensity exercise. ? Try to do strengthening exercises at least twice each week. Do these in addition to the moderate-intensity exercise.  Know your numbers.Ask your health care provider to check your cholesterol and your blood glucose. Continue to have your blood tested as directed by your health care provider.  What should I know about cancer screening? There are several types of cancer. Take the following steps to reduce your risk and to catch any cancer development as early as possible. Breast Cancer  Practice breast self-awareness. ? This means understanding how your breasts normally appear and feel. ? It also means doing regular breast self-exams. Let your health care provider know about any changes, no matter how small.  If you are 52 or older, have a clinician do a breast exam (clinical breast exam or CBE) every year. Depending on your age, family history, and medical history, it may be recommended that you also have a yearly breast X-ray (mammogram).  If you have a family history of breast cancer, talk with your health care provider about genetic screening.  If  you are at high risk for breast cancer, talk with your health care provider about having an MRI and a mammogram every year.  Breast cancer (BRCA) gene test is recommended for women who have family members with BRCA-related cancers. Results of the assessment will determine the need for genetic counseling and BRCA1 and for BRCA2 testing. BRCA-related cancers include these types: ? Breast. This occurs in males or females. ? Ovarian. ? Tubal. This may also be called fallopian tube cancer. ? Cancer of the abdominal or pelvic lining (peritoneal cancer). ? Prostate. ? Pancreatic.  Cervical, Uterine, and Ovarian Cancer Your health care provider may recommend that you be screened regularly for cancer of the pelvic organs. These include your ovaries, uterus, and vagina. This screening involves a pelvic exam, which includes checking for microscopic changes to the surface of your cervix (Pap test).  For women ages 21-65, health care providers may recommend a pelvic exam and a Pap test every three years. For women ages 27-65, they may recommend the Pap test and pelvic exam, combined with  testing for human papilloma virus (HPV), every five years. Some types of HPV increase your risk of cervical cancer. Testing for HPV may also be done on women of any age who have unclear Pap test results.  Other health care providers may not recommend any screening for nonpregnant women who are considered low risk for pelvic cancer and have no symptoms. Ask your health care provider if a screening pelvic exam is right for you.  If you have had past treatment for cervical cancer or a condition that could lead to cancer, you need Pap tests and screening for cancer for at least 20 years after your treatment. If Pap tests have been discontinued for you, your risk factors (such as having a new sexual partner) need to be reassessed to determine if you should start having screenings again. Some women have medical problems that increase  the chance of getting cervical cancer. In these cases, your health care provider may recommend that you have screening and Pap tests more often.  If you have a family history of uterine cancer or ovarian cancer, talk with your health care provider about genetic screening.  If you have vaginal bleeding after reaching menopause, tell your health care provider.  There are currently no reliable tests available to screen for ovarian cancer.  Lung Cancer Lung cancer screening is recommended for adults 47-66 years old who are at high risk for lung cancer because of a history of smoking. A yearly low-dose CT scan of the lungs is recommended if you:  Currently smoke.  Have a history of at least 30 pack-years of smoking and you currently smoke or have quit within the past 15 years. A pack-year is smoking an average of one pack of cigarettes per day for one year.  Yearly screening should:  Continue until it has been 15 years since you quit.  Stop if you develop a health problem that would prevent you from having lung cancer treatment.  Colorectal Cancer  This type of cancer can be detected and can often be prevented.  Routine colorectal cancer screening usually begins at age 28 and continues through age 17.  If you have risk factors for colon cancer, your health care provider may recommend that you be screened at an earlier age.  If you have a family history of colorectal cancer, talk with your health care provider about genetic screening.  Your health care provider may also recommend using home test kits to check for hidden blood in your stool.  A small camera at the end of a tube can be used to examine your colon directly (sigmoidoscopy or colonoscopy). This is done to check for the earliest forms of colorectal cancer.  Direct examination of the colon should be repeated every 5-10 years until age 9. However, if early forms of precancerous polyps or small growths are found or if you have a  family history or genetic risk for colorectal cancer, you may need to be screened more often.  Skin Cancer  Check your skin from head to toe regularly.  Monitor any moles. Be sure to tell your health care provider: ? About any new moles or changes in moles, especially if there is a change in a mole's shape or color. ? If you have a mole that is larger than the size of a pencil eraser.  If any of your family members has a history of skin cancer, especially at a young age, talk with your health care provider about genetic screening.  Always  use sunscreen. Apply sunscreen liberally and repeatedly throughout the day.  Whenever you are outside, protect yourself by wearing long sleeves, pants, a wide-brimmed hat, and sunglasses.  What should I know about osteoporosis? Osteoporosis is a condition in which bone destruction happens more quickly than new bone creation. After menopause, you may be at an increased risk for osteoporosis. To help prevent osteoporosis or the bone fractures that can happen because of osteoporosis, the following is recommended:  If you are 55-17 years old, get at least 1,000 mg of calcium and at least 600 mg of vitamin D per day.  If you are older than age 66 but younger than age 61, get at least 1,200 mg of calcium and at least 600 mg of vitamin D per day.  If you are older than age 53, get at least 1,200 mg of calcium and at least 800 mg of vitamin D per day.  Smoking and excessive alcohol intake increase the risk of osteoporosis. Eat foods that are rich in calcium and vitamin D, and do weight-bearing exercises several times each week as directed by your health care provider. What should I know about how menopause affects my mental health? Depression may occur at any age, but it is more common as you become older. Common symptoms of depression include:  Low or sad mood.  Changes in sleep patterns.  Changes in appetite or eating patterns.  Feeling an overall lack  of motivation or enjoyment of activities that you previously enjoyed.  Frequent crying spells.  Talk with your health care provider if you think that you are experiencing depression. What should I know about immunizations? It is important that you get and maintain your immunizations. These include:  Tetanus, diphtheria, and pertussis (Tdap) booster vaccine.  Influenza every year before the flu season begins.  Pneumonia vaccine.  Shingles vaccine.  Your health care provider may also recommend other immunizations. This information is not intended to replace advice given to you by your health care provider. Make sure you discuss any questions you have with your health care provider. Document Released: 06/15/2005 Document Revised: 11/11/2015 Document Reviewed: 01/25/2015 Elsevier Interactive Patient Education  2018 [employer].

## 2016-11-28 ENCOUNTER — Ambulatory Visit (HOSPITAL_BASED_OUTPATIENT_CLINIC_OR_DEPARTMENT_OTHER)
Admission: RE | Admit: 2016-11-28 | Discharge: 2016-11-28 | Disposition: A | Discharge status: Home / Self Care | Payer: Medicare Other | Source: Ambulatory Visit | Attending: Family Medicine | Admitting: Family Medicine

## 2016-11-28 DIAGNOSIS — Z13820 Encounter for screening for osteoporosis: Secondary | ICD-10-CM | POA: Diagnosis not present

## 2016-11-28 DIAGNOSIS — M8580 Other specified disorders of bone density and structure, unspecified site: Secondary | ICD-10-CM | POA: Insufficient documentation

## 2016-11-28 DIAGNOSIS — E2839 Other primary ovarian failure: Secondary | ICD-10-CM | POA: Insufficient documentation

## 2016-12-05 ENCOUNTER — Telehealth: Payer: Self-pay | Admitting: Family Medicine

## 2016-12-05 NOTE — Telephone Encounter (Signed)
Pt returned call for bone density results.

## 2016-12-06 ENCOUNTER — Encounter: Payer: Self-pay | Admitting: Family Medicine

## 2016-12-06 NOTE — Telephone Encounter (Signed)
Called her back- went over her bone density report Continue vitamin D She cannot take calcium due to history of hyperparathryoidism, but she does take vitamin D and will work on getting weight bearing exercise Plan to repeat bone density in about 2 years Copy of report to pt

## 2016-12-27 ENCOUNTER — Encounter: Payer: Self-pay | Admitting: Cardiovascular Disease

## 2016-12-27 ENCOUNTER — Ambulatory Visit (INDEPENDENT_AMBULATORY_CARE_PROVIDER_SITE_OTHER): Payer: Medicare Other | Admitting: Cardiovascular Disease

## 2016-12-27 VITALS — BP 114/60 | HR 61 | Ht 65.0 in | Wt 216.0 lb

## 2016-12-27 DIAGNOSIS — I119 Hypertensive heart disease without heart failure: Secondary | ICD-10-CM | POA: Diagnosis not present

## 2016-12-27 NOTE — Patient Instructions (Signed)

## 2016-12-27 NOTE — Progress Notes (Signed)
Chief Complaint  Patient presents with  . Follow-up    hypertensive heart disease     History of Present Illness: 71 yo female with history of HTN, HLD, GERD, anxiety, depression, hyperparathyroidism who is here today for cardiac follow up. She was followed remotely in our office by Dr. Verl Blalock for an abnormal EKG. Echo 2011 with normal LV function, no valve issues. I met her in January 2018. She reported occasional dyspnea. Her EKG showed poor R wave progression in the precordial leads and possible inferior Q waves, all unchanged from 2009. Echo February 2018 with normal LV systolic function, grade 1 diastolic dysfunction, mild LVH, no valve disease.   She is here today for follow up. The patient denies any chest pain, dyspnea, palpitations, lower extremity edema, orthopnea, PND, dizziness, near syncope or syncope. She has rare "flutters' in her chest that last for 1-2 seconds.   Primary Care Physician: Darreld Mclean, MD  Past Medical History:  Diagnosis Date  . Abnormal EKG    per patient- states evaluated by Dr Verl Blalock in the past  . Allergy    sneezing  late Fall  . Anemia 01/23/12   H/H 11.7/35  . Anxiety state, unspecified   . Arthritis   . Benign paroxysmal positional vertigo   . Depression   . Depressive disorder, not elsewhere classified   . Diverticulosis   . Esophageal reflux   . Esophageal stricture   . Gallstones   . Heart murmur   . Hiatal hernia   . History of hyperparathyroidism   . History of UTI   . HTN (hypertension)   . Hyperlipidemia   . Other abnormal glucose   . Personal history of other diseases of digestive system    12-15 since 1990, no recurrence 2006  . PONV (postoperative nausea and vomiting)   . Unspecified hypothyroidism   . Vitamin D deficiency     Past Surgical History:  Procedure Laterality Date  . [place]  . CHOLECYSTECTOMY  2004  . COLONOSCOPY  12/2003   Tics  . DENTAL SURGERY  1962, 2012   Age 20-Abscessed Teeth  Extracted   . PARATHYROIDECTOMY Left 02/13/2013   Procedure: LEFT INFERIOR PARATHYROIDECTOMY with frozen section ;  Surgeon: Earnstine Regal, MD;  Location: [facility];  Service: General;  Laterality: Left;  . TOTAL ABDOMINAL HYSTERECTOMY W/ BILATERAL SALPINGOOPHORECTOMY  2005   Ovarian Cyst    Current Outpatient Prescriptions  Medication Sig Dispense Refill  . albuterol (PROVENTIL HFA;VENTOLIN HFA) 108 (90 Base) MCG/ACT inhaler Inhale 2 puffs into the lungs every 6 (six) hours as needed for wheezing or shortness of breath. 1 Inhaler 2  . ALPRAZolam (XANAX) 0.5 MG tablet Take 0.5 mg by mouth 3 (three) times daily as needed for anxiety.     . Cholecalciferol (VITAMIN D3) 2000 UNITS TABS Take 1 tablet by mouth daily.    . cyanocobalamin 100 MCG tablet Take 100 mcg by mouth daily.    . fexofenadine (ALLEGRA) 180 MG tablet Take 180 mg by mouth as needed for allergies. Reported on 07/18/2015    . FLUoxetine (PROZAC) 40 MG capsule Take 40 mg by mouth every morning.     . fluticasone (FLONASE) 50 MCG/ACT nasal spray Place 1 spray into both nostrils 2 (two) times daily as needed for rhinitis. 16 g 3  . fluticasone (FLOVENT HFA) 110 MCG/ACT inhaler Inhale 2 puffs into the lungs 2 (two) times daily. 1 Inhaler 2  . hydrochlorothiazide (MICROZIDE)  12.5 MG capsule Take 1 capsule (12.5 mg total) by mouth daily. 90 capsule 3  . losartan (COZAAR) 50 MG tablet Take 1 tablet (50 mg total) by mouth daily. 90 tablet 3  . omeprazole (PRILOSEC) 20 MG capsule TAKE 1 CAPSULE DAILY 90 capsule 3  . SYNTHROID 100 MCG tablet TAKE 1 TABLET DAILY AND TAKE ONE-HALF (1/2) TABLET ON SUNDAY 90 tablet 3   No current facility-administered medications for this visit.     Allergies  Allergen Reactions  . Augmentin [Amoxicillin-Pot Clavulanate] Rash    Social History   Social History  . Marital status: Married    Spouse name: N/A  . Number of children: 1  . Years of education: N/A   Occupational History  . cleans offices  and homes Retired  . retired [employer] cabin [employer]    Social History Main Topics  . Smoking status: Former Smoker    Quit date: 05/07/1973  . Smokeless tobacco: Never Used     Comment: Smoked 1968-1975 up to 2 ppd  . Alcohol use 8.4 oz/week    14 Standard drinks or equivalent per week     Comment: 2 nightly wine  . Drug use: No  . Sexual activity: Yes   Other Topics Concern  . Not on file   Social History Narrative   Regular exercise: no   Caffeine use: daily    Family History  Problem Relation Age of Onset  . Coronary artery disease Father   . Heart attack Father 75  . COPD Father   . Diabetes Mother   . Dysrhythmia Mother   . Depression Mother   . COPD Mother   . Colon polyps Mother   . Kidney disease Mother   . Prostate cancer Brother   . Other Brother        Wegener's in sinus; [place]  . Heart disease Brother   . Arthritis Brother   . Hypertension Brother   . Prostate cancer Brother   . Cancer Brother        Soft tissue in the face/temple  . Stroke Maternal Aunt        >65  . Stroke Maternal Grandmother        >65  . Stroke Maternal Grandfather 47    Review of Systems:  As stated in the HPI and otherwise negative.   BP 114/60   Pulse 61   Ht 5' 5"  (1.651 m)   Wt 216 lb (98 kg)   SpO2 96%   BMI 35.94 kg/m   Physical Examination: General: Well developed, well nourished, NAD  HEENT: OP clear, mucus membranes moist  SKIN: warm, dry. No rashes. Neuro: No focal deficits  Musculoskeletal: Muscle strength 5/5 all ext  Psychiatric: Mood and affect normal  Neck: No JVD, no carotid bruits, no thyromegaly, no lymphadenopathy.  Lungs:Clear bilaterally, no wheezes, rhonci, crackles Cardiovascular: Regular rate and rhythm. No murmurs, gallops or rubs. Abdomen:Soft. Bowel sounds present. Non-tender.  Extremities: No lower extremity edema. Pulses are 2 + in the bilateral DP/PT.  Echo February 2018:  Left ventricle: The cavity size was normal. Wall thickness was    increased in a pattern of mild LVH. Systolic function was normal.   The estimated ejection fraction was in the range of 60% to 65%.   Wall motion was normal; there were no regional wall motion   abnormalities. Doppler parameters are consistent with abnormal   left ventricular relaxation (grade 1 diastolic dysfunction).   Doppler parameters are consistent with  high ventricular filling   pressure.  Impressions:  - Normal LV systolic function; grade 1 diastolic dysfunction with   elevated LV filling pressure; mild LVH.  EKG:  EKG is  ordered today. The ekg ordered today demonstrates   Recent Labs: 11/15/2016: BUN 12; Creat 0.76; Potassium 4.3; Sodium 138; TSH 1.13 11/21/2016: ALT 24   Lipid Panel    Component Value Date/Time   CHOL 240 (H) 11/21/2016 0947   CHOL 236 (H) 10/12/2014 1046   TRIG 78.0 11/21/2016 0947   TRIG 109 10/12/2014 1046   TRIG 91 05/13/2006 1031   HDL 74.00 11/21/2016 0947   HDL 90 10/12/2014 1046   CHOLHDL 3 11/21/2016 0947   VLDL 15.6 11/21/2016 0947   LDLCALC 151 (H) 11/21/2016 0947   LDLCALC 124 (H) 10/12/2014 1046   LDLDIRECT 142.7 12/27/2008 1035     Wt Readings from Last 3 Encounters:  12/27/16 216 lb (98 kg)  11/21/16 213 lb 9.6 oz (96.9 kg)  11/15/16 214 lb (97.1 kg)     Other studies Reviewed: Additional studies/ records that were reviewed today include: . Review of the above records demonstrates:   Assessment and Plan:   1. Hypertensive heart disease: Echo February 2018 with mild LVH, grade 1 diastolic dysfunction. BP is well controlled. No changes at this time.    Current medicines are reviewed at length with the patient today.  The patient does not have concerns regarding medicines.  The following changes have been made:  no change  Labs/ tests ordered today include:   No orders of the defined types were placed in this encounter.   Disposition:   FU with me in 12  months  Signed, Lauree Chandler, Jerilynn Mages 12/27/2016 11:29 AM     [address], [place], [place]  [address] Phone: [phone]; Fax: [phone]

## 2017-02-07 ENCOUNTER — Telehealth: Payer: Self-pay | Admitting: *Deleted

## 2017-02-07 NOTE — Telephone Encounter (Signed)
Received Provider Query from [employer] for Medical Record Clarification on Depression for Coding purposes, OV note attached; forwarded to provider/SLS  10/04

## 2017-02-13 ENCOUNTER — Ambulatory Visit: Payer: Self-pay | Admitting: Internal Medicine

## 2017-02-13 ENCOUNTER — Other Ambulatory Visit (INDEPENDENT_AMBULATORY_CARE_PROVIDER_SITE_OTHER): Payer: Self-pay

## 2017-02-13 ENCOUNTER — Ambulatory Visit (INDEPENDENT_AMBULATORY_CARE_PROVIDER_SITE_OTHER): Payer: Medicare Other | Admitting: Gastroenterology

## 2017-02-13 ENCOUNTER — Encounter: Payer: Self-pay | Admitting: Gastroenterology

## 2017-02-13 VITALS — BP 122/70 | HR 68 | Ht 65.0 in | Wt 211.4 lb

## 2017-02-13 DIAGNOSIS — R140 Abdominal distension (gaseous): Secondary | ICD-10-CM | POA: Diagnosis not present

## 2017-02-13 DIAGNOSIS — K219 Gastro-esophageal reflux disease without esophagitis: Secondary | ICD-10-CM

## 2017-02-13 DIAGNOSIS — K5900 Constipation, unspecified: Secondary | ICD-10-CM

## 2017-02-13 DIAGNOSIS — R1013 Epigastric pain: Secondary | ICD-10-CM | POA: Diagnosis not present

## 2017-02-13 LAB — COMPREHENSIVE METABOLIC PANEL
ALK PHOS: 75 U/L (ref 39–117)
ALT: 27 U/L (ref 0–35)
AST: 27 U/L (ref 0–37)
Albumin: 4.2 g/dL (ref 3.5–5.2)
BILIRUBIN TOTAL: 0.5 mg/dL (ref 0.2–1.2)
BUN: 6 mg/dL (ref 6–23)
CO2: 31 mEq/L (ref 19–32)
CREATININE: 0.78 mg/dL (ref 0.40–1.20)
Calcium: 9.8 mg/dL (ref 8.4–10.5)
Chloride: 101 mEq/L (ref 96–112)
GFR: 77.6 mL/min (ref >60.00)
GLUCOSE: 109 mg/dL — AB (ref 70–99)
Potassium: 3.7 mEq/L (ref 3.5–5.1)
SODIUM: 139 meq/L (ref 135–145)
TOTAL PROTEIN: 7.3 g/dL (ref 6.0–8.3)

## 2017-02-13 LAB — LIPASE: Lipase: 37 U/L (ref 11.0–59.0)

## 2017-02-13 LAB — CBC WITH DIFFERENTIAL/PLATELET
BASOS ABS: 0 10*3/uL (ref 0.0–0.1)
Basophils Relative: 0.4 % (ref 0.0–3.0)
Eosinophils Absolute: 0.1 10*3/uL (ref 0.0–0.7)
Eosinophils Relative: 0.8 % (ref 0.0–5.0)
HCT: 40.7 % (ref 36.0–46.0)
HEMOGLOBIN: 13.6 g/dL (ref 12.0–15.0)
LYMPHS ABS: 1.2 10*3/uL (ref 0.7–4.0)
Lymphocytes Relative: 16.6 % (ref 12.0–46.0)
MCHC: 33.4 g/dL (ref 30.0–36.0)
MCV: 93.2 fl (ref 78.0–100.0)
MONO ABS: 0.5 10*3/uL (ref 0.1–1.0)
Monocytes Relative: 7.3 % (ref 3.0–12.0)
NEUTROS PCT: 74.9 % (ref 43.0–77.0)
Neutro Abs: 5.2 10*3/uL (ref 1.4–7.7)
Platelets: 315 10*3/uL (ref 150.0–400.0)
RBC: 4.37 Mil/uL (ref 3.87–5.11)
RDW: 12.4 % (ref 11.5–15.5)
WBC: 6.9 10*3/uL (ref 4.0–10.5)

## 2017-02-13 NOTE — Patient Instructions (Signed)
Your physician has requested that you go to the basement for lab work before leaving today.  Continue omeprazole daily.   Take Miralax twice daily for the next 2-3 days then daily long-term.  You have been scheduled for an abdominal ultrasound at [facility] (1st floor of hospital) on 02/20/17 at 10:00am. Please arrive 15 minutes prior to your appointment for registration. Make certain not to have anything to eat or drink 6 hours prior to your appointment. Should you need to reschedule your appointment, please contact radiology at [phone]. This test typically takes about 30 minutes to perform.  Thank you for choosing me and [facility].  Venita Lick. Pleas Koch., MD., Clementeen Graham

## 2017-02-13 NOTE — Progress Notes (Signed)
    History of Present Illness: This is a 71 year old female omeprazole qod for 1 year who complains of epigastric pain, nausea, worsening constipation and bloating. She increased omeprazole to daily and her symptoms have improved however the constipation, epigastric pain and bloating persist. Denies weight loss, diarrhea, change in stool caliber, melena, hematochezia, vomiting, dysphagia, chest pain.  LFTs, BMET, TSH in 11/2016 were normal.  EGD 11/2014: 1. Two sessile polyps in the gastric fundus; sampling biopsies performed 2. 4 cm hiatal hernia 3. The EGD otherwise appeared normal  Colonoscopy 11/2014 1. Mild diverticulosis in the sigmoid colon, transverse colon, and ascending colon 2. Grade l internal hemorrhoids  Current Medications, Allergies, Past Medical History, Past Surgical History, Family History and Social History were reviewed in [employer] record.  Physical Exam: General: Well developed, well nourished, no acute distress Head: Normocephalic and atraumatic Eyes:  sclerae anicteric, EOMI Ears: Normal auditory acuity Mouth: No deformity or lesions Lungs: Clear throughout to auscultation Heart: Regular rate and rhythm; no murmurs, rubs or bruits Abdomen: Soft, non tender and non distended. No masses, hepatosplenomegaly or hernias noted. Normal Bowel sounds Rectal: not done Musculoskeletal: Symmetrical with no gross deformities  Pulses:  Normal pulses noted Extremities: No clubbing, cyanosis, edema or deformities noted Neurological: Alert oriented x 4, grossly nonfocal Psychological:  Alert and cooperative. Normal mood and affect  Assessment and Recommendations:  1. GERD. Continue omeprazole 20 mg daily and follow standard antireflux measures.  2. Epigastric pain, bloating and constipation. Advised to begin MiraLAX twice daily for the next 3 days and then daily long-term for an adequate bowel movements daily. Schedule abdominal ultrasound. CBC,  CMP, lipase today. If symptoms not resolved in 1-2 weeks pt is advised to call.

## 2017-02-20 ENCOUNTER — Ambulatory Visit (HOSPITAL_COMMUNITY)
Admission: RE | Admit: 2017-02-20 | Discharge: 2017-02-20 | Disposition: A | Discharge status: Home / Self Care | Payer: Medicare Other | Source: Ambulatory Visit | Attending: Gastroenterology | Admitting: Gastroenterology

## 2017-02-20 DIAGNOSIS — K760 Fatty (change of) liver, not elsewhere classified: Secondary | ICD-10-CM | POA: Insufficient documentation

## 2017-02-20 DIAGNOSIS — Z9049 Acquired absence of other specified parts of digestive tract: Secondary | ICD-10-CM | POA: Insufficient documentation

## 2017-02-20 DIAGNOSIS — K219 Gastro-esophageal reflux disease without esophagitis: Secondary | ICD-10-CM | POA: Diagnosis not present

## 2017-04-22 ENCOUNTER — Telehealth: Payer: Self-pay | Admitting: Internal Medicine

## 2017-04-22 MED ORDER — SYNTHROID 100 MCG PO TABS: ORAL_TABLET | ORAL | 1 refills | Status: DC

## 2017-04-22 NOTE — Telephone Encounter (Signed)
Refill of SYNTHROID 100 MCG tablet [[id number]]  Send to [facility] - [place], [place] - [address]

## 2017-04-22 NOTE — Telephone Encounter (Signed)
Sent to pharmacy 

## 2017-05-27 ENCOUNTER — Other Ambulatory Visit: Payer: Self-pay | Admitting: Family Medicine

## 2017-05-27 DIAGNOSIS — I10 Essential (primary) hypertension: Secondary | ICD-10-CM

## 2017-08-01 ENCOUNTER — Telehealth: Payer: Self-pay | Admitting: Family Medicine

## 2017-08-01 DIAGNOSIS — I10 Essential (primary) hypertension: Secondary | ICD-10-CM

## 2017-08-01 NOTE — Telephone Encounter (Signed)
Copied from CRM [id number]. Topic: Quick Communication - See Telephone Encounter >> Aug 01, 2017 10:22 AM Arlyss Gandy, NT wrote: CRM for notification. See Telephone encounter for: 08/01/17. Pt states that her pharmacy sent her a letter that her losartan (COZAAR) is being recalled and she would like to see if a different blood pressure medication can be ordered. [facility].

## 2017-08-02 ENCOUNTER — Encounter: Payer: Self-pay | Admitting: Family Medicine

## 2017-08-02 MED ORDER — LOSARTAN POTASSIUM 50 MG PO TABS: 50.0000 mg | ORAL_TABLET | Freq: Every day | ORAL | 3 refills | Status: DC

## 2017-10-09 ENCOUNTER — Other Ambulatory Visit: Payer: Self-pay | Admitting: Family Medicine

## 2017-10-09 ENCOUNTER — Other Ambulatory Visit: Payer: Self-pay | Admitting: Internal Medicine

## 2017-10-09 DIAGNOSIS — K219 Gastro-esophageal reflux disease without esophagitis: Secondary | ICD-10-CM

## 2017-10-18 ENCOUNTER — Encounter: Payer: Self-pay | Admitting: Medical

## 2017-10-18 ENCOUNTER — Ambulatory Visit (INDEPENDENT_AMBULATORY_CARE_PROVIDER_SITE_OTHER): Payer: Medicare Other | Admitting: Medical

## 2017-10-18 VITALS — BP 132/65 | HR 58 | Temp 98.7°F | Resp 16 | Ht 65.0 in | Wt 208.4 lb

## 2017-10-18 DIAGNOSIS — J208 Acute bronchitis due to other specified organisms: Secondary | ICD-10-CM

## 2017-10-18 DIAGNOSIS — R05 Cough: Secondary | ICD-10-CM

## 2017-10-18 DIAGNOSIS — R062 Wheezing: Secondary | ICD-10-CM | POA: Diagnosis not present

## 2017-10-18 DIAGNOSIS — J40 Bronchitis, not specified as acute or chronic: Secondary | ICD-10-CM

## 2017-10-18 DIAGNOSIS — J0100 Acute maxillary sinusitis, unspecified: Secondary | ICD-10-CM | POA: Diagnosis not present

## 2017-10-18 DIAGNOSIS — B9689 Other specified bacterial agents as the cause of diseases classified elsewhere: Secondary | ICD-10-CM

## 2017-10-18 DIAGNOSIS — R059 Cough, unspecified: Secondary | ICD-10-CM

## 2017-10-18 MED ORDER — DOXYCYCLINE HYCLATE 100 MG PO TABS: 100.0000 mg | ORAL_TABLET | Freq: Two times a day (BID) | ORAL | 0 refills | Status: DC

## 2017-10-18 MED ORDER — ALBUTEROL SULFATE HFA 108 (90 BASE) MCG/ACT IN AERS: 2.0000 | INHALATION_SPRAY | Freq: Four times a day (QID) | RESPIRATORY_TRACT | 2 refills | Status: DC | PRN

## 2017-10-18 MED ORDER — BENZONATATE 100 MG PO CAPS: 100.0000 mg | ORAL_CAPSULE | Freq: Three times a day (TID) | ORAL | 0 refills | Status: DC | PRN

## 2017-10-18 NOTE — Progress Notes (Signed)
Subjective:    Patient ID: Gail Fuentes, female    DOB: 1945-07-25, 72 y.o.   MRN: [id number]  HPI  Pt in feeling sick since Sunday night. She has waxing and waning fatigue. Pt states some nasal congestion, sinus pressure, chest congestion and cough.  Some mild ha with the above.  She states some cough at night with occasional wheeze. In past wheezes with illness but no history of asthma.  Pt has remote hx of smoking in 70's.   Review of Systems  Constitutional: Positive for chills and fatigue. Negative for fever.       Subjective fever.  HENT: Positive for congestion, sinus pressure and sinus pain. Negative for ear pain and sore throat.   Respiratory: Positive for cough and wheezing. Negative for chest tightness and shortness of breath.   Cardiovascular: Negative for chest pain and palpitations.  Gastrointestinal: Negative for abdominal pain.  Musculoskeletal: Negative for back pain and myalgias.  Skin: Negative for rash.  Hematological: Negative for adenopathy. Does not bruise/bleed easily.  Psychiatric/Behavioral: Negative for behavioral problems, confusion and sleep disturbance. The patient is not nervous/anxious.     Past Medical History:  Diagnosis Date  . Abnormal EKG    per patient- states evaluated by Dr Daleen Squibb in the past  . Allergy    sneezing  late Fall  . Anemia 01/23/12   H/H 11.7/35  . Anxiety state, unspecified   . Arthritis   . Benign paroxysmal positional vertigo   . Depression   . Depressive disorder, not elsewhere classified   . Diverticulosis   . Esophageal reflux   . Esophageal stricture   . Gallstones   . Heart murmur   . Hiatal hernia   . History of hyperparathyroidism   . History of UTI   . HTN (hypertension)   . Hyperlipidemia   . Other abnormal glucose   . Personal history of other diseases of digestive system    12-15 since 1990, no recurrence 2006  . PONV (postoperative nausea and vomiting)   . Unspecified hypothyroidism   .  Vitamin D deficiency      Social History   Socioeconomic History  . Marital status: Married    Spouse name: Not on file  . Number of children: 1  . Years of education: Not on file  . Highest education level: Not on file  Occupational History  . 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 Occupation: retired Visual merchandiser  Social Needs  . Financial resource strain: Not on file  . Food insecurity:    Worry: Not on file    Inability: Not on file  . Transportation needs:    Medical: Not on file    Non-medical: Not on file  Tobacco Use  . Smoking status: Former Smoker    Last attempt to quit: 05/07/1973    Years since quitting: 44.4  . Smokeless tobacco: Never Used  . Tobacco comment: Smoked 1968-1975 up to 2 ppd  Substance and Sexual Activity  . Alcohol use: Yes    Alcohol/week: 8.4 oz    Types: 14 Standard drinks or equivalent per week    Comment: 2 nightly wine  . Drug use: No  . Sexual activity: Yes  Lifestyle  . Physical activity:    Days per week: Not on file    Minutes per session: Not on file  . Stress: Not on file  Relationships  . Social connections:    Talks on phone:  Not on file    Gets together: Not on file    Attends religious service: Not on file    Active member of club or organization: Not on file    Attends meetings of clubs or organizations: Not on file    Relationship status: Not on file  . Intimate partner violence:    Fear of current or ex partner: Not on file    Emotionally abused: Not on file    Physically abused: Not on file    Forced sexual activity: Not on file  Other Topics Concern  . Not on file  Social History Narrative   Regular exercise: no   Caffeine use: daily    Past Surgical History:  Procedure Laterality Date  . CESAREAN SECTION  1977  . CHOLECYSTECTOMY  2004  . COLONOSCOPY  12/2003   Tics  . DENTAL SURGERY  1962, 2012   Age 59-Abscessed Teeth Extracted   . PARATHYROIDECTOMY Left 02/13/2013    Procedure: LEFT INFERIOR PARATHYROIDECTOMY with frozen section ;  Surgeon: Velora Heckler, MD;  Location: [facility];  Service: General;  Laterality: Left;  . TOTAL ABDOMINAL HYSTERECTOMY W/ BILATERAL SALPINGOOPHORECTOMY  2005   Ovarian Cyst    Family History  Problem Relation Age of Onset  . Coronary artery disease Father   . Heart attack Father 72  . COPD Father   . Diabetes Mother   . Dysrhythmia Mother   . Depression Mother   . COPD Mother   . Colon polyps Mother   . Kidney disease Mother   . Prostate cancer Brother   . Other Brother        Wegener's in sinus; [facility]  . Heart disease Brother   . Arthritis Brother   . Hypertension Brother   . Prostate cancer Brother   . Cancer Brother        Soft tissue in the face/temple  . Stroke Maternal Aunt        >65  . Stroke Maternal Grandmother        >65  . Stroke Maternal Grandfather 47    Allergies  Allergen Reactions  . Augmentin [Amoxicillin-Pot Clavulanate] Rash    Current Outpatient Medications on File Prior to Visit  Medication Sig Dispense Refill  . ALPRAZolam (XANAX) 0.5 MG tablet Take 0.5 mg by mouth 3 (three) times daily as needed for anxiety.     . Cholecalciferol (VITAMIN D3) 2000 UNITS TABS Take 1 tablet by mouth daily.    . cyanocobalamin 100 MCG tablet Take 100 mcg by mouth daily.    . fexofenadine (ALLEGRA) 180 MG tablet Take 180 mg by mouth as needed for allergies. Reported on 07/18/2015    . FLUoxetine (PROZAC) 40 MG capsule Take 40 mg by mouth every morning.     . fluticasone (FLONASE) 50 MCG/ACT nasal spray Place 1 spray into both nostrils 2 (two) times daily as needed for rhinitis. 16 g 3  . hydrochlorothiazide (MICROZIDE) 12.5 MG capsule TAKE 1 CAPSULE DAILY 30 capsule 0  . levothyroxine (SYNTHROID, LEVOTHROID) 100 MCG tablet TAKE ONE TABLET DAILY AND TAKE ONE-HALF (1/2) TABLET ON SUNDAY 90 tablet 1  . losartan (COZAAR) 50 MG tablet Take 1 tablet (50 mg total) by mouth daily. 90 tablet 3  . omeprazole  (PRILOSEC) 20 MG capsule TAKE 1 CAPSULE DAILY 30 capsule 0  . albuterol (PROVENTIL HFA;VENTOLIN HFA) 108 (90 Base) MCG/ACT inhaler Inhale 2 puffs into the lungs every 6 (six) hours as needed for wheezing or shortness  of breath. (Patient not taking: Reported on 10/18/2017) 1 Inhaler 2  . fluticasone (FLOVENT HFA) 110 MCG/ACT inhaler Inhale 2 puffs into the lungs 2 (two) times daily. (Patient not taking: Reported on 10/18/2017) 1 Inhaler 2   No current facility-administered medications on file prior to visit.     BP 132/65   Pulse (!) 58   Temp 98.7 F (37.1 C) (Oral)   Resp 16   Ht 5\' 5"  (1.651 m)   Wt 208 lb 6.4 oz (94.5 kg)   SpO2 97%   BMI 34.68 kg/m       Objective:   Physical Exam  General  Mental Status - Alert. General Appearance - Well groomed. Not in acute distress.  Skin Rashes- No Rashes.  HEENT Head- Normal. Ear Auditory Canal - Left- Normal. Right - Normal.Tympanic Membrane- Left- Normal. Right- Normal. Eye Sclera/Conjunctiva- Left- Normal. Right- Normal. Nose & Sinuses Nasal Mucosa- Left-  Boggy and Congested. Right-  Boggy and  Congested.Bilateral maxillary and frontal sinus pressure. Mouth & Throat Lips: Upper Lip- Normal: no dryness, cracking, pallor, cyanosis, or vesicular eruption. Lower Lip-Normal: no dryness, cracking, pallor, cyanosis or vesicular eruption. Buccal Mucosa- Bilateral- No Aphthous ulcers. Oropharynx- No Discharge or Erythema. Tonsils: Characteristics- Bilateral- No Erythema or Congestion. Size/Enlargement- Bilateral- No enlargement. Discharge- bilateral-None.  Neck Neck- Supple. No Masses.   Chest and Lung Exam Auscultation: Breath Sounds:-Clear even and unlabored.  Cardiovascular Auscultation:Rythm- Regular, rate and rhythm. Murmurs & Other Heart Sounds:Ausculatation of the heart reveal- No Murmurs.  Lymphatic Head & Neck General Head & Neck Lymphatics: Bilateral: Description- No Localized lymphadenopathy.         Assessment & Plan:   You appear to have bronchitis and sinusitis(more sinus infection) Rest hydrate and tylenol for fever. I am prescribing cough medicine benzontate and doxycycline antibiotic. For your nasal congestion flonase.  You should gradually get better. If not then notify us and would recommend a chest xray.  For wheezing making albuterol available.  Follow up in 7-10 days or as needed   [employer], [employer]

## 2017-10-18 NOTE — Patient Instructions (Signed)
You appear to have bronchitis and sinusitis(more sinus infection) Rest hydrate and tylenol for fever. I am prescribing cough medicine benzontate and doxycycline antibiotic. For your nasal congestion flonase.  You should gradually get better. If not then notify us and would recommend a chest xray.  For wheezing making albuterol available.  Follow up in 7-10 days or as needed

## 2017-11-14 NOTE — Progress Notes (Signed)
Subjective:   Gail Fuentes is a 72 y.o. female who presents for Medicare Annual (Subsequent) preventive examination.  Pt does crosswords and reads some.  Review of Systems: No ROS.  Medicare Wellness Visit. Additional risk factors are reflected in the social history. Cardiac Risk Factors include: advanced age (>53men, >2 women);dyslipidemia;hypertension;obesity (BMI >30kg/m2) Sleep patterns: Sleeps at least 7 hrs per night.  Home Safety/Smoke Alarms: Feels safe in home. Smoke alarms in place.  Living environment; residence and Firearm Safety: Lives with husband. 2 story home. Master on first. No issues with stairs. Walk-in shower.  Female:       Mammo- Dr[facility]-02/04[facility]-normal (pt reported) Dexa scan- utd       CCS- due 11/2024 Eye- pt states she needs cataract sx. Dr.Gould yearly. Wears contacts for distance.     Objective:     Vitals: BP 130/70 (BP Location: Left Arm, Patient Position: Sitting, Cuff Size: Normal)   Pulse (!) 59   Ht 5\' 5"  (1.651 m)   Wt 210 lb 9.6 oz (95.5 kg)   SpO2 98%   BMI 35.05 kg/m   Body mass index is 35.05 kg/m. Wt Readings from Last 3 Encounters:  11/22/17 210 lb 9.6 oz (95.5 kg)  11/15/17 210 lb 12.8 oz (95.6 kg)  10/18/17 208 lb 6.4 oz (94.5 kg)   Temp Readings from Last 3 Encounters:  10/18/17 98.7 F (37.1 C) (Oral)  11/21/16 97.6 F (36.4 C) (Oral)  05/18/16 99.5 F (37.5 C) (Oral)   BP Readings from Last 3 Encounters:  11/22/17 130/70  11/15/17 121/73  10/18/17 132/65   Pulse Readings from Last 3 Encounters:  11/22/17 (!) 59  11/15/17 (!) 56  10/18/17 (!) 58    Advanced Directives 11/22/2017 11/21/2016 11/19/2014 02/10/2013  Does Patient Have a Medical Advance Directive? Yes Yes Yes Patient has advance directive, copy not in chart  Type of Advance Directive Healthcare Power of [place];Living will Healthcare Power of [place];Living will Healthcare Power of [place];Living will Healthcare Power of  [place];Living will  Copy of Healthcare Power of Attorney in Chart? No - copy requested No - copy requested - Copy requested from family    Tobacco Social History   Tobacco Use  Smoking Status Former Smoker  . Last attempt to quit: 05/07/1973  . Years since quitting: 44.5  Smokeless Tobacco Never Used  Tobacco Comment   Smoked [phone] up to 2 ppd     Counseling given: Not Answered Comment: Smoked [phone] up to 2 ppd   Clinical Intake:     Pain : No/denies pain                 Past Medical History:  Diagnosis Date  . Abnormal EKG    per patient- states evaluated by Dr Daleen Squibb in the past  . Allergy    sneezing  late Fall  . Anemia 01/23/12   H/H 11.7/35  . Anxiety state, unspecified   . Arthritis   . Benign paroxysmal positional vertigo   . Depression   . Depressive disorder, not elsewhere classified   . Diverticulosis   . Esophageal reflux   . Esophageal stricture   . Gallstones   . Heart murmur   . Hiatal hernia   . History of hyperparathyroidism   . History of UTI   . HTN (hypertension)   . Hyperlipidemia   . Other abnormal glucose   . Personal history of other diseases of digestive system    12-15 since 1990,  no recurrence 2006  . PONV (postoperative nausea and vomiting)   . Unspecified hypothyroidism   . Vitamin D deficiency    Past Surgical History:  Procedure Laterality Date  . CESAREAN SECTION  1977  . CHOLECYSTECTOMY  2004  . COLONOSCOPY  12/2003   Tics  . DENTAL SURGERY  1962, 2012   Age 37-Abscessed Teeth Extracted   . PARATHYROIDECTOMY Left 02/13/2013   Procedure: LEFT INFERIOR PARATHYROIDECTOMY with frozen section ;  Surgeon: Velora Heckler, MD;  Location: [facility];  Service: General;  Laterality: Left;  . TOTAL ABDOMINAL HYSTERECTOMY W/ BILATERAL SALPINGOOPHORECTOMY  2005   Ovarian Cyst   Family History  Problem Relation Age of Onset  . Coronary artery disease Father   . Heart attack Father 56  . COPD Father   . Diabetes  Mother   . Dysrhythmia Mother   . Depression Mother   . COPD Mother   . Colon polyps Mother   . Kidney disease Mother   . Prostate cancer Brother   . Other Brother        Wegener's in sinus; [facility]  . Heart disease Brother   . Arthritis Brother   . Hypertension Brother   . Prostate cancer Brother   . Cancer Brother        Soft tissue in the face/temple  . Stroke Maternal Aunt        >65  . Stroke Maternal Grandmother        >65  . Stroke Maternal Grandfather 35   Social History   Socioeconomic History  . Marital status: Married    Spouse name: Not on file  . Number of children: 1  . Years of education: Not on file  . Highest education level: Not on file  Occupational History  . 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 Occupation: retired Visual merchandiser  Social Needs  . Financial resource strain: Not on file  . Food insecurity:    Worry: Not on file    Inability: Not on file  . Transportation needs:    Medical: Not on file    Non-medical: Not on file  Tobacco Use  . Smoking status: Former Smoker    Last attempt to quit: 05/07/1973    Years since quitting: 44.5  . Smokeless tobacco: Never Used  . Tobacco comment: Smoked 1968-1975 up to 2 ppd  Substance and Sexual Activity  . Alcohol use: Yes    Alcohol/week: 8.4 oz    Types: 14 Standard drinks or equivalent per week    Comment: 2 nightly wines  . Drug use: No  . Sexual activity: Yes  Lifestyle  . Physical activity:    Days per week: Not on file    Minutes per session: Not on file  . Stress: Not on file  Relationships  . Social connections:    Talks on phone: Not on file    Gets together: Not on file    Attends religious service: Not on file    Active member of club or organization: Not on file    Attends meetings of clubs or organizations: Not on file    Relationship status: Not on file  Other Topics Concern  . Not on file  Social History Narrative   Regular exercise: no    Caffeine use: daily    Outpatient Encounter Medications as of 11/22/2017  Medication Sig  . ALPRAZolam (XANAX) 0.5 MG tablet Take 0.5 mg by mouth 3 (three)  times daily as needed for anxiety.   . Cholecalciferol (VITAMIN D3) 2000 UNITS TABS Take 2 tablets by mouth daily.   . cyanocobalamin 100 MCG tablet Take 100 mcg by mouth daily.  . fexofenadine (ALLEGRA) 180 MG tablet Take 180 mg by mouth as needed for allergies. Reported on 07/18/2015  . FLUoxetine (PROZAC) 40 MG capsule Take 40 mg by mouth every morning.   . hydrochlorothiazide (MICROZIDE) 12.5 MG capsule TAKE 1 CAPSULE DAILY  . losartan (COZAAR) 50 MG tablet Take 1 tablet (50 mg total) by mouth daily.  [name] omeprazole (PRILOSEC) 20 MG capsule TAKE 1 CAPSULE DAILY  . SYNTHROID 100 MCG tablet TAKE ONE TABLET DAILY AND TAKE ONE-HALF (1/2) TABLET ON SUNDAY,  . [DISCONTINUED] albuterol (PROVENTIL HFA;VENTOLIN HFA) 108 (90 Base) MCG/ACT inhaler Inhale 2 puffs into the lungs every 6 (six) hours as needed for wheezing or shortness of breath. (Patient not taking: Reported on 10/18/2017)  . [DISCONTINUED] albuterol (PROVENTIL HFA;VENTOLIN HFA) 108 (90 Base) MCG/ACT inhaler Inhale 2 puffs into the lungs every 6 (six) hours as needed for wheezing or shortness of breath.  . [DISCONTINUED] benzonatate (TESSALON) 100 MG capsule Take 1 capsule (100 mg total) by mouth 3 (three) times daily as needed for cough.  . [DISCONTINUED] doxycycline (VIBRA-TABS) 100 MG tablet Take 1 tablet (100 mg total) by mouth 2 (two) times daily. Can give caps or generic  . [DISCONTINUED] fluticasone (FLONASE) 50 MCG/ACT nasal spray Place 1 spray into both nostrils 2 (two) times daily as needed for rhinitis.  . [DISCONTINUED] fluticasone (FLOVENT HFA) 110 MCG/ACT inhaler Inhale 2 puffs into the lungs 2 (two) times daily. (Patient not taking: Reported on 10/18/2017)  . [DISCONTINUED] levothyroxine (SYNTHROID, LEVOTHROID) 100 MCG tablet TAKE ONE TABLET DAILY AND TAKE ONE-HALF (1/2) TABLET  ON SUNDAY   No facility-administered encounter medications on file as of 11/22/2017.     Activities of Daily Living In your present state of health, do you have any difficulty performing the following activities: 11/22/2017  Hearing? N  Vision? N  Difficulty concentrating or making decisions? N  Walking or climbing stairs? N  Dressing or bathing? N  Doing errands, shopping? N  Preparing Food and eating ? N  Using the Toilet? N  In the past six months, have you accidently leaked urine? N  Do you have problems with loss of bowel control? N  Managing your Medications? N  Managing your Finances? N  Housekeeping or managing your Housekeeping? N  Some recent data might be hidden    Patient Care Team: Copland, Gwenlyn Found, MD as PCP - General (Family Medicine)    Assessment:   This is a routine wellness examination for Gail Fuentes. Physical assessment deferred to PCP.  Exercise Activities and Dietary recommendations Current Exercise Habits: The patient does not participate in regular exercise at present, Exercise limited by: None identified Diet (meal preparation, eat out, water intake, caffeinated beverages, dairy products, fruits and vegetables): in general, an "unhealthy" diet Breakfast: cherrios Lunch: skip Dinner:  Hotdog Drinks 3 glasses of water per day  Goals    . To be out and more social.       Fall Risk Fall Risk  11/22/2017 11/21/2016 11/21/2016 11/17/2015 07/18/2015  Falls in the past year? No No No No No    Depression Screen PHQ 2/9 Scores 11/22/2017 11/21/2016 11/21/2016 11/17/2015  PHQ - 2 Score 1 0 0 0  PHQ- 9 Score - - - -     Cognitive Function MMSE - Mini Mental  State Exam 11/22/2017  Orientation to time 5  Orientation to Place 5  Registration 3  Attention/ Calculation 5  Recall 3  Language- name 2 objects 2  Language- repeat 1  Language- follow 3 step command 3  Language- read & follow direction 1  Write a sentence 1  Copy design 1  Total score 30         Immunization History  Administered Date(s) Administered  . Influenza Split 03/11/2012, 02/22/2017  . Influenza Whole 02/20/2008, 03/06/2010  . Influenza, High Dose Seasonal PF 03/17/2013  . Influenza,inj,Quad PF,6+ Mos 03/04/2014  . Influenza-Unspecified 03/08/2015, 03/06/2016  . Pneumococcal Conjugate-13 07/18/2015  . Pneumococcal Polysaccharide-23 03/06/2013  . Td 07/18/2015    Screening Tests Health Maintenance  Topic Date Due  . INFLUENZA VACCINE  12/05/2017  . MAMMOGRAM  02/26/2018  . COLONOSCOPY  11/18/2024  . TETANUS/TDAP  07/17/2025  . DEXA SCAN  Completed  . Hepatitis C Screening  Completed  . PNA vac Low Risk Adult  Completed      Plan:    Please schedule your next medicare wellness visit with me in 1 yr.  Continue to eat heart healthy diet (full of fruits, vegetables, whole grains, lean protein, water--limit salt, fat, and sugar intake) and increase physical activity as tolerated.  Continue doing brain stimulating activities (puzzles, reading, adult coloring books, staying active) to keep memory sharp.   Bring a copy of your living will and/or healthcare power of attorney to your next office visit.    I have personally reviewed and noted the following in the patient's chart:   . Medical and social history . Use of alcohol, tobacco or illicit drugs  . Current medications and supplements . Functional ability and status . Nutritional status . Physical activity . Advanced directives . List of other physicians . Hospitalizations, surgeries, and ER visits in previous 12 months . Vitals . Screenings to include cognitive, depression, and falls . Referrals and appointments  In addition, I have reviewed and discussed with patient certain preventive protocols, quality metrics, and best practice recommendations. A written personalized care plan for preventive services as well as general preventive health recommendations were provided to patient.     Mady Haagensen [place], [place]  11/22/2017

## 2017-11-15 ENCOUNTER — Ambulatory Visit (INDEPENDENT_AMBULATORY_CARE_PROVIDER_SITE_OTHER): Payer: Medicare Other | Admitting: Internal Medicine

## 2017-11-15 ENCOUNTER — Encounter: Payer: Self-pay | Admitting: Internal Medicine

## 2017-11-15 VITALS — BP 121/73 | HR 56 | Ht 65.0 in | Wt 210.8 lb

## 2017-11-15 DIAGNOSIS — M810 Age-related osteoporosis without current pathological fracture: Secondary | ICD-10-CM

## 2017-11-15 DIAGNOSIS — E039 Hypothyroidism, unspecified: Secondary | ICD-10-CM

## 2017-11-15 DIAGNOSIS — Z8639 Personal history of other endocrine, nutritional and metabolic disease: Secondary | ICD-10-CM

## 2017-11-15 DIAGNOSIS — E559 Vitamin D deficiency, unspecified: Secondary | ICD-10-CM | POA: Diagnosis not present

## 2017-11-15 DIAGNOSIS — E041 Nontoxic single thyroid nodule: Secondary | ICD-10-CM | POA: Diagnosis not present

## 2017-11-15 LAB — TSH: TSH: 0.99 u[IU]/mL (ref 0.35–4.50)

## 2017-11-15 LAB — BASIC METABOLIC PANEL WITH GFR
BUN: 10 mg/dL (ref 7–25)
CALCIUM: 9.3 mg/dL (ref 8.6–10.4)
CO2: 28 mmol/L (ref 20–32)
CREATININE: 0.75 mg/dL (ref 0.60–0.93)
Chloride: 103 mmol/L (ref 98–110)
GFR, EST NON AFRICAN AMERICAN: 81 mL/min/{1.73_m2} (ref >=60)
GFR, Est African American: 94 mL/min/{1.73_m2} (ref >=60)
Glucose, Bld: 79 mg/dL (ref 65–99)
POTASSIUM: 4.4 mmol/L (ref 3.5–5.3)
Sodium: 139 mmol/L (ref 135–146)

## 2017-11-15 LAB — VITAMIN D 25 HYDROXY (VIT D DEFICIENCY, FRACTURES): VITD: 25.82 ng/mL — ABNORMAL LOW (ref 30.00–100.00)

## 2017-11-15 LAB — T4, FREE: Free T4: 0.97 ng/dL (ref 0.60–1.60)

## 2017-11-15 NOTE — Patient Instructions (Signed)
Please stop at the lab.  Continue Vitamin D 2000 units daily.  Continue Levothyroxine 100 mcg 6/7 days and 50 mcg 1/7 days.  Take the thyroid hormone every day, with water, >30 minutes before breakfast, separated by >4 hours from acid reflux medications, calcium, iron, multivitamins.  Please return in 1 year.  

## 2017-11-15 NOTE — Progress Notes (Signed)
Subjective:     Patient ID: Gail Fuentes, female   DOB: [birth year], 72 y.o.   MRN: [id number]  HPI Gail Fuentes is a pleasant 72 y/o returning for f/u for dx hyperparathyroidism, s/p parathyroidectomy, hypothyroidism, and a R thyroid nodule. Last visit 1 year ago.  Last mo she had bronchitis + sinus infection. She is still hoarse.  Reviewed and addended history: Pt had a thyroid ultrasound (10/08/2012) showing only a right thyroid nodule, 7 x 7 x 7 mm, with internal calcifications. On a subsequent thyroid U/S >> this nodule has disappeared.  A technetium sestamibi scan showed a left inferior parathyroid adenoma and a CT scan confirmed this. However, during surgery, it was found that she had a left superior parathyroid adenoma, which was excised - left superior parathyroidectomy with Dr Gerrit Friends in 02/2013.   She feels much better after her PTH and calcium normalized.  Reviewed previous PTH and calcium levels, normal after surgery: Lab Results  Component Value Date   PTH 35 11/15/2015   PTH Comment 11/15/2015   PTH 41 11/15/2014   PTH Comment 11/15/2014   PTH 44 03/16/2014   PTH Comment 03/16/2014   CALCIUM 9.8 02/13/2017   CALCIUM 9.3 11/15/2016   CALCIUM 9.7 11/15/2015   CALCIUM 9.5 07/18/2015   CALCIUM 9.5 11/15/2014   CALCIUM 9.4 09/24/2014   CALCIUM 8.8 03/16/2014   CALCIUM 9.3 09/03/2013   CALCIUM 9.2 04/20/2013   CALCIUM 9.1 03/16/2013   CALCIUM 9.1 02/27/2013   CALCIUM 10.7 (H) 02/10/2013   CALCIUM 10.2 10/20/2012   CALCIUM 10.5 10/02/2012   CALCIUM 10.9 (H) 10/01/2012   No history of kidney stones but had an osteoporotic T score at the ultra distal radius per DEXA scan in 2014.  No fractures.  Latest bone density scan on 11/28/2016 showing osteopenia: - L1-L4: -2.2 - RFN: -1.1 - LFN: -1.3 FRAX: Major osteoporotic fracture risk over 10 years: 13.7; hip fracture risk over 10 years: 2.1.  But unfortunately, appendicular skeleton was not checked.  H/o vit D def   -  previously on ergocalciferol now on vitamin D 2000 units daily  Latest vit D level normal: Lab Results  Component Value Date   VD25OH 43.09 11/15/2016   VD25OH 45.13 11/15/2015   VD25OH 34.16 07/18/2015   VD25OH 30.52 10/12/2014   VD25OH 19.76 (L) 03/16/2014   VD25OH 35 09/03/2013   VD25OH 35 11/04/2012   VD25OH 59 12/27/2011   VD25OH 48 03/30/2011   VD25OH 50 03/06/2010   Hypothyroidism.  -Long-term history of this  Pt is on Synthroid d.a.w. 100 Mcg 6 out of 7 days and 50 mcg 1 out of 7 days, taken: - in am - fasting - at least 20-90 min from b'fast - no Ca, Fe, MVI - + PPIs later in the day - not on Biotin  Reviewed latest TSH levels: Normal: Lab Results  Component Value Date   TSH 1.13 11/15/2016   TSH 1.84 07/18/2015   TSH 1.27 11/15/2014   TSH 0.95 09/21/2014   TSH 0.32 (L) 07/13/2014   FREET4 0.88 11/15/2016   FREET4 0.92 11/15/2014   FREET4 0.77 09/21/2014   FREET4 0.95 07/13/2014   FREET4 0.95 05/19/2014   Pt denies: - feeling nodules in neck - dysphagia - choking - SOB with lying down  + Hoarseness - URI.  Review of Systems Constitutional: + weight gain/no weight loss, + fatigue, no subjective hyperthermia, no subjective hypothermia Eyes: + blurry vision, no xerophthalmia ENT: no sore throat, + see  HPI Cardiovascular: no CP/no SOB/no palpitations/no leg swelling Respiratory: + cough/no SOB/+ wheezing Gastrointestinal: no N/no V/+ D/+ C/+ acid reflux Musculoskeletal: + muscle aches/+ joint aches Skin: no rashes, no hair loss Neurological: no tremors/no numbness/no tingling/no dizziness  I reviewed pt's medications, allergies, PMH, social hx, family hx, and changes were documented in the history of present illness. Otherwise, unchanged from my initial visit note. Started B12.  Past Medical History:  Diagnosis Date  . Abnormal EKG    per patient- states evaluated by Dr Daleen Squibb in the past  . Allergy    sneezing  late Fall  . Anemia 01/23/12    H/H 11.7/35  . Anxiety state, unspecified   . Arthritis   . Benign paroxysmal positional vertigo   . Depression   . Depressive disorder, not elsewhere classified   . Diverticulosis   . Esophageal reflux   . Esophageal stricture   . Gallstones   . Heart murmur   . Hiatal hernia   . History of hyperparathyroidism   . History of UTI   . HTN (hypertension)   . Hyperlipidemia   . Other abnormal glucose   . Personal history of other diseases of digestive system    12-15 since 1990, no recurrence 2006  . PONV (postoperative nausea and vomiting)   . Unspecified hypothyroidism   . Vitamin D deficiency    Past Surgical History:  Procedure Laterality Date  . CESAREAN SECTION  1977  . CHOLECYSTECTOMY  2004  . COLONOSCOPY  12/2003   Tics  . DENTAL SURGERY  1962, 2012   Age 5-Abscessed Teeth Extracted   . PARATHYROIDECTOMY Left 02/13/2013   Procedure: LEFT INFERIOR PARATHYROIDECTOMY with frozen section ;  Surgeon: Velora Heckler, MD;  Location: [facility];  Service: General;  Laterality: Left;  . TOTAL ABDOMINAL HYSTERECTOMY W/ BILATERAL SALPINGOOPHORECTOMY  2005   Ovarian Cyst   Social History   Socioeconomic History  . Marital status: Married    Spouse name: Not on file  . Number of children: 1  . Years of education: Not on file  . Highest education level: Not on file  Occupational History  . 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 Occupation: retired Visual merchandiser  Social Needs  . Financial resource strain: Not on file  . Food insecurity:    Worry: Not on file    Inability: Not on file  . Transportation needs:    Medical: Not on file    Non-medical: Not on file  Tobacco Use  . Smoking status: Former Smoker    Last attempt to quit: 05/07/1973    Years since quitting: 44.5  . Smokeless tobacco: Never Used  . Tobacco comment: Smoked 1968-1975 up to 2 ppd  Substance and Sexual Activity  . Alcohol use: Yes    Alcohol/week: 8.4 oz    Types: 14  Standard drinks or equivalent per week    Comment: 2 nightly wine  . Drug use: No  . Sexual activity: Yes  Lifestyle  . Physical activity:    Days per week: Not on file    Minutes per session: Not on file  . Stress: Not on file  Relationships  . Social connections:    Talks on phone: Not on file    Gets together: Not on file    Attends religious service: Not on file    Active member of club or organization: Not on file    Attends meetings of clubs or organizations:  Not on file    Relationship status: Not on file  . Intimate partner violence:    Fear of current or ex partner: Not on file    Emotionally abused: Not on file    Physically abused: Not on file    Forced sexual activity: Not on file  Other Topics Concern  . Not on file  Social History Narrative   Regular exercise: no   Caffeine use: daily   Current Outpatient Medications on File Prior to Visit  Medication Sig Dispense Refill  . ALPRAZolam (XANAX) 0.5 MG tablet Take 0.5 mg by mouth 3 (three) times daily as needed for anxiety.     . Cholecalciferol (VITAMIN D3) 2000 UNITS TABS Take 1 tablet by mouth daily.    . cyanocobalamin 100 MCG tablet Take 100 mcg by mouth daily.    . fexofenadine (ALLEGRA) 180 MG tablet Take 180 mg by mouth as needed for allergies. Reported on 07/18/2015    . FLUoxetine (PROZAC) 40 MG capsule Take 40 mg by mouth every morning.     . hydrochlorothiazide (MICROZIDE) 12.5 MG capsule TAKE 1 CAPSULE DAILY 30 capsule 0  . levothyroxine (SYNTHROID, LEVOTHROID) 100 MCG tablet TAKE ONE TABLET DAILY AND TAKE ONE-HALF (1/2) TABLET ON SUNDAY 90 tablet 1  . losartan (COZAAR) 50 MG tablet Take 1 tablet (50 mg total) by mouth daily. 90 tablet 3  . omeprazole (PRILOSEC) 20 MG capsule TAKE 1 CAPSULE DAILY 30 capsule 0   No current facility-administered medications on file prior to visit.    Allergies  Allergen Reactions  . Augmentin [Amoxicillin-Pot Clavulanate] Rash   Family History  Problem Relation Age  of Onset  . Coronary artery disease Father   . Heart attack Father 63  . COPD Father   . Diabetes Mother   . Dysrhythmia Mother   . Depression Mother   . COPD Mother   . Colon polyps Mother   . Kidney disease Mother   . Prostate cancer Brother   . Other Brother        Wegener's in sinus; [facility]  . Heart disease Brother   . Arthritis Brother   . Hypertension Brother   . Prostate cancer Brother   . Cancer Brother        Soft tissue in the face/temple  . Stroke Maternal Aunt        >65  . Stroke Maternal Grandmother        >65  . Stroke Maternal Grandfather 47    Objective:   Physical Exam BP 121/73   Pulse (!) 56   Ht 5\' 5"  (1.651 m)   Wt 210 lb 12.8 oz (95.6 kg)   SpO2 98%   BMI 35.08 kg/m  Body mass index is 35.08 kg/m. Wt Readings from Last 3 Encounters:  11/15/17 210 lb 12.8 oz (95.6 kg)  10/18/17 208 lb 6.4 oz (94.5 kg)  02/13/17 211 lb 6.4 oz (95.9 kg)   Constitutional: overweight, in NAD Eyes: PERRLA, EOMI, no exophthalmos ENT: moist mucous membranes, no thyromegaly, no cervical lymphadenopathy Cardiovascular: RRR, No MRG Respiratory: CTA B Gastrointestinal: abdomen soft, NT, ND, BS+ Musculoskeletal: no deformities, strength intact in all 4 Skin: moist, warm, no rashes Neurological: no tremor with outstretched hands, DTR normal in all 4  Assessment:     1. Primary hyperparathyroidism - status post left superior parathyroidectomy 02/13/2013, by Dr. Gerrit Friends - calcium < 1 mg/dl above normal before the surgery - elevated PTH (133, 238) - normal vit D  -  elevated 1,25 vitamin D - no renal failure - osteopenia  - no h/o kidney stones - DXA 01/23/2010 ([facility]):  L1-4 T score -2.0  Left femoral neck T score -1.9  Right femoral neck T score -1.4  10 year any fracture risk 13.8%, 10 year hip fracture risk 2.9% - CT neck 01/14/2013:    Focal nodular appearance along the posterior aspect of the left lobe of thyroid corresponds with a sestamibi exam and  suggests a left lower parathyroid adenoma.   No significant nodular tissue adjacent to the submandibular glands.   Sub centimeter lymph nodes are likely within normal limits.  Focal calcification is noted within the right lobe of the thyroid.  - Tc sestamibi scan 12/29/2012: suspicious for left thyroid lobe region parathyroid  adenoma. Questionable abnormal focus also in the right submandibular space - DXA 11/14/2012 ([facility]):  L1-4 T score -2.0 >> -2.1 (but high SD, L1 T score is -2.8)  Left femoral neck T score -1.9 >> -1.9  Right femoral neck T score -1.4 >> -1.7  33% distal radius: -1.5  But worrisome that UD radius -3.1 (good evaluation for trabecular bone) - UCa 300 mg/24h (11/06/2012)   She had parathyroidectomy with Dr Gerrit Friends on 02/13/2013.   - DXA 11/28/2016 ([facility]):  - L1-L4: -2.2  - RFN: -1.1  - LFN: -1.3 FRAX: Major osteoporotic fracture risk over 10 years: 13.7; hip fracture risk over 10 years: 2.1.  2. Thyroid nodule - thyroid ultrasound 10/08/2012: Thyroid echotexture is heterogeneous.   Right lobe: Measures 3 x 1.2 x 1.1 cm.   Left lobe: Measures 2.9 x 1.9 x 0.9 cm.   Isthmus: Measures 0.4 cm.   Focal lesions: Within the lower pole of the right lobe of thyroid gland there is a hypoechoic nodule containing coarsened calcifications. This measures 7 x 7 x 7 mm.   No lymphadenopathy identified. - thyroid U/S 03/22/2014:  Right thyroid lobe: 3.4 x 1.1 x 1.0 cm. Stable to slightly smaller focal area of nodularity with dystrophic shadowing calcifications measures approximately 0.6 cm in diameter. The thyroid parenchyma is very heterogeneous and shows no abnormal vascularity.  Left thyroid lobe: 2.9 x 1.1 x 1.1 cm. No nodules visualized. Heterogeneous parenchyma noted without abnormal vascularity. The left lobe is small in size.  Isthmus Thickness: 0.9 cm. No nodules visualized.  Lymphadenopathy None visualized.  3. Hypothyroidism   4.  vitamin D deficiency  5. OP  Plan:     1. Primary Hyperparathyroidism: -Patient with history of primary hyperparathyroidism, with minimally elevated calcium but high PTH levels in the past.  She is now status post parathyroidectomy in 02/2013 by Dr. Gerrit Friends.  0.5 g adenoma was resected at that time.  Calcium and PTH levels normalized afterwards.  She is usually having annual calcium levels checked and these have been normal postop. - last calcium also normal in 02/2017: Lab Results  Component Value Date   CALCIUM 9.8 02/13/2017   CALCIUM 9.3 11/15/2016   2. R thyroid nodule - She had a right 7 mm thyroid nodule with internal calcification, which was stable or even smaller on the latest thyroid ultrasound  - no neck compression symptoms - No follow-up ultrasound needed, will follow her clinically  3. Hypothyroidism - latest thyroid labs reviewed with pt >> normal 1 year ago - she continues on LT4 100 mcg 6 out of 7 days and 50 mcg 1 out of 7 days - pt feels good on this dose. - we discussed  about taking the thyroid hormone every day, with water, >30 minutes before breakfast, separated by >4 hours from acid reflux medications, calcium, iron, multivitamins. Pt. is taking it correctly. - will check thyroid tests today: TSH and fT4 - If labs are abnormal, she will need to return for repeat TFTs in 1.5 months  4. Vit D deficiency - continues vitamin D 2000 units daily -We will check a new level today  5.  Osteoporosis  - reviewed previous DXA scan reports >>  osteoporotic radius score before her parathyroid surgery.  Results of the bone density scan from last year showed an improved set of T-scores, however, these were checked on another DXA machine - will recheck a DXA scan including appendicular skeleton next year on the same machine  Needs refills LT4 DAW.  Component     Latest Ref Rng & Units 11/15/2017  Glucose     65 - 99 mg/dL 79  BUN     7 - 25 mg/dL 10  Creatinine      1.61 - 0.93 mg/dL 0.96  GFR, Est Non African American     > OR = 60 mL/min/1.65m2 81  GFR, Est African American     > OR = 60 mL/min/1.108m2 94  BUN/Creatinine Ratio     6 - 22 (calc) NOT APPLICABLE  Sodium     135 - 146 mmol/L 139  Potassium     3.5 - 5.3 mmol/L 4.4  Chloride     98 - 110 mmol/L 103  CO2     20 - 32 mmol/L 28  Calcium     8.6 - 10.4 mg/dL 9.3  TSH     0.45 - 4.09 uIU/mL 0.99  T4,Free(Direct)     0.60 - 1.60 ng/dL 8.11  VITD     91.47 - 100.00 ng/mL 25.82 (L)  TSH normal. Vit D low >> increase vitamin D to 4000 units daily.  Carlus Pavlov, MD PhD [facility] Endocrinology

## 2017-11-18 MED ORDER — SYNTHROID 100 MCG PO TABS: ORAL_TABLET | ORAL | 3 refills | Status: DC

## 2017-11-22 ENCOUNTER — Encounter: Payer: Self-pay | Admitting: *Deleted

## 2017-11-22 ENCOUNTER — Ambulatory Visit (INDEPENDENT_AMBULATORY_CARE_PROVIDER_SITE_OTHER): Payer: Medicare Other | Admitting: *Deleted

## 2017-11-22 VITALS — BP 130/70 | HR 59 | Ht 65.0 in | Wt 210.6 lb

## 2017-11-22 DIAGNOSIS — Z0000 Encounter for general adult medical examination without abnormal findings: Secondary | ICD-10-CM | POA: Diagnosis not present

## 2017-11-22 NOTE — Patient Instructions (Signed)
Please schedule your next medicare wellness visit with me in 1 yr.  Continue to eat heart healthy diet (full of fruits, vegetables, whole grains, lean protein, water--limit salt, fat, and sugar intake) and increase physical activity as tolerated.  Continue doing brain stimulating activities (puzzles, reading, adult coloring books, staying active) to keep memory sharp.   Bring a copy of your living will and/or healthcare power of attorney to your next office visit.   Gail Fuentes , Thank you for taking time to come for your Medicare Wellness Visit. I appreciate your ongoing commitment to your health goals. Please review the following plan we discussed and let me know if I can assist you in the future.   These are the goals we discussed: Goals    . DIET - EAT MORE FRUITS AND VEGETABLES    . Increase physical activity    . To be out and more social.       This is a list of the screening recommended for you and due dates:  Health Maintenance  Topic Date Due  . Flu Shot  12/05/2017  . Mammogram  02/26/2018  . Colon Cancer Screening  11/18/2024  . Tetanus Vaccine  07/17/2025  . DEXA scan (bone density measurement)  Completed  .  Hepatitis C: One time screening is recommended by Center for Disease Control  (CDC) for  adults born from [birth year] through 1964.   Completed  . Pneumonia vaccines  Completed    Health Maintenance for Postmenopausal Women Menopause is a normal process in which your reproductive ability comes to an end. This process happens gradually over a span of months to years, usually between the ages of 97 and 55. Menopause is complete when you have missed 12 consecutive menstrual periods. It is important to talk with your health care provider about some of the most common conditions that affect postmenopausal women, such as heart disease, cancer, and bone loss (osteoporosis). Adopting a healthy lifestyle and getting preventive care can help to promote your health and wellness.  Those actions can also lower your chances of developing some of these common conditions. What should I know about menopause? During menopause, you may experience a number of symptoms, such as:  Moderate-to-severe hot flashes.  Night sweats.  Decrease in sex drive.  Mood swings.  Headaches.  Tiredness.  Irritability.  Memory problems.  Insomnia.  Choosing to treat or not to treat menopausal changes is an individual decision that you make with your health care provider. What should I know about hormone replacement therapy and supplements? Hormone therapy products are effective for treating symptoms that are associated with menopause, such as hot flashes and night sweats. Hormone replacement carries certain risks, especially as you become older. If you are thinking about using estrogen or estrogen with progestin treatments, discuss the benefits and risks with your health care provider. What should I know about heart disease and stroke? Heart disease, heart attack, and stroke become more likely as you age. This may be due, in part, to the hormonal changes that your body experiences during menopause. These can affect how your body processes dietary fats, triglycerides, and cholesterol. Heart attack and stroke are both medical emergencies. There are many things that you can do to help prevent heart disease and stroke:  Have your blood pressure checked at least every 1-2 years. High blood pressure causes heart disease and increases the risk of stroke.  If you are 22-79 years old, ask your health care provider if you should  take aspirin to prevent a heart attack or a stroke.  Do not use any tobacco products, including cigarettes, chewing tobacco, or electronic cigarettes. If you need help quitting, ask your health care provider.  It is important to eat a healthy diet and maintain a healthy weight. ? Be sure to include plenty of vegetables, fruits, low-fat dairy products, and lean  protein. ? Avoid eating foods that are high in solid fats, added sugars, or salt (sodium).  Get regular exercise. This is one of the most important things that you can do for your health. ? Try to exercise for at least 150 minutes each week. The type of exercise that you do should increase your heart rate and make you sweat. This is known as moderate-intensity exercise. ? Try to do strengthening exercises at least twice each week. Do these in addition to the moderate-intensity exercise.  Know your numbers.Ask your health care provider to check your cholesterol and your blood glucose. Continue to have your blood tested as directed by your health care provider.  What should I know about cancer screening? There are several types of cancer. Take the following steps to reduce your risk and to catch any cancer development as early as possible. Breast Cancer  Practice breast self-awareness. ? This means understanding how your breasts normally appear and feel. ? It also means doing regular breast self-exams. Let your health care provider know about any changes, no matter how small.  If you are 62 or older, have a clinician do a breast exam (clinical breast exam or CBE) every year. Depending on your age, family history, and medical history, it may be recommended that you also have a yearly breast X-ray (mammogram).  If you have a family history of breast cancer, talk with your health care provider about genetic screening.  If you are at high risk for breast cancer, talk with your health care provider about having an MRI and a mammogram every year.  Breast cancer (BRCA) gene test is recommended for women who have family members with BRCA-related cancers. Results of the assessment will determine the need for genetic counseling and BRCA1 and for BRCA2 testing. BRCA-related cancers include these types: ? Breast. This occurs in males or females. ? Ovarian. ? Tubal. This may also be called fallopian tube  cancer. ? Cancer of the abdominal or pelvic lining (peritoneal cancer). ? Prostate. ? Pancreatic.  Cervical, Uterine, and Ovarian Cancer Your health care provider may recommend that you be screened regularly for cancer of the pelvic organs. These include your ovaries, uterus, and vagina. This screening involves a pelvic exam, which includes checking for microscopic changes to the surface of your cervix (Pap test).  For women ages 21-65, health care providers may recommend a pelvic exam and a Pap test every three years. For women ages 83-65, they may recommend the Pap test and pelvic exam, combined with testing for human papilloma virus (HPV), every five years. Some types of HPV increase your risk of cervical cancer. Testing for HPV may also be done on women of any age who have unclear Pap test results.  Other health care providers may not recommend any screening for nonpregnant women who are considered low risk for pelvic cancer and have no symptoms. Ask your health care provider if a screening pelvic exam is right for you.  If you have had past treatment for cervical cancer or a condition that could lead to cancer, you need Pap tests and screening for cancer for at least  20 years after your treatment. If Pap tests have been discontinued for you, your risk factors (such as having a new sexual partner) need to be reassessed to determine if you should start having screenings again. Some women have medical problems that increase the chance of getting cervical cancer. In these cases, your health care provider may recommend that you have screening and Pap tests more often.  If you have a family history of uterine cancer or ovarian cancer, talk with your health care provider about genetic screening.  If you have vaginal bleeding after reaching menopause, tell your health care provider.  There are currently no reliable tests available to screen for ovarian cancer.  Lung Cancer Lung cancer screening is  recommended for adults 46-109 years old who are at high risk for lung cancer because of a history of smoking. A yearly low-dose CT scan of the lungs is recommended if you:  Currently smoke.  Have a history of at least 30 pack-years of smoking and you currently smoke or have quit within the past 15 years. A pack-year is smoking an average of one pack of cigarettes per day for one year.  Yearly screening should:  Continue until it has been 15 years since you quit.  Stop if you develop a health problem that would prevent you from having lung cancer treatment.  Colorectal Cancer  This type of cancer can be detected and can often be prevented.  Routine colorectal cancer screening usually begins at age 31 and continues through age 47.  If you have risk factors for colon cancer, your health care provider may recommend that you be screened at an earlier age.  If you have a family history of colorectal cancer, talk with your health care provider about genetic screening.  Your health care provider may also recommend using home test kits to check for hidden blood in your stool.  A small camera at the end of a tube can be used to examine your colon directly (sigmoidoscopy or colonoscopy). This is done to check for the earliest forms of colorectal cancer.  Direct examination of the colon should be repeated every 5-10 years until age 51. However, if early forms of precancerous polyps or small growths are found or if you have a family history or genetic risk for colorectal cancer, you may need to be screened more often.  Skin Cancer  Check your skin from head to toe regularly.  Monitor any moles. Be sure to tell your health care provider: ? About any new moles or changes in moles, especially if there is a change in a mole's shape or color. ? If you have a mole that is larger than the size of a pencil eraser.  If any of your family members has a history of skin cancer, especially at a young age,  talk with your health care provider about genetic screening.  Always use sunscreen. Apply sunscreen liberally and repeatedly throughout the day.  Whenever you are outside, protect yourself by wearing long sleeves, pants, a wide-brimmed hat, and sunglasses.  What should I know about osteoporosis? Osteoporosis is a condition in which bone destruction happens more quickly than new bone creation. After menopause, you may be at an increased risk for osteoporosis. To help prevent osteoporosis or the bone fractures that can happen because of osteoporosis, the following is recommended:  If you are 60-19 years old, get at least 1,000 mg of calcium and at least 600 mg of vitamin D per day.  If you  are older than age 67 but younger than age 31, get at least 1,200 mg of calcium and at least 600 mg of vitamin D per day.  If you are older than age 66, get at least 1,200 mg of calcium and at least 800 mg of vitamin D per day.  Smoking and excessive alcohol intake increase the risk of osteoporosis. Eat foods that are rich in calcium and vitamin D, and do weight-bearing exercises several times each week as directed by your health care provider. What should I know about how menopause affects my mental health? Depression may occur at any age, but it is more common as you become older. Common symptoms of depression include:  Low or sad mood.  Changes in sleep patterns.  Changes in appetite or eating patterns.  Feeling an overall lack of motivation or enjoyment of activities that you previously enjoyed.  Frequent crying spells.  Talk with your health care provider if you think that you are experiencing depression. What should I know about immunizations? It is important that you get and maintain your immunizations. These include:  Tetanus, diphtheria, and pertussis (Tdap) booster vaccine.  Influenza every year before the flu season begins.  Pneumonia vaccine.  Shingles vaccine.  Your health care  provider may also recommend other immunizations. This information is not intended to replace advice given to you by your health care provider. Make sure you discuss any questions you have with your health care provider. Document Released: 06/15/2005 Document Revised: 11/11/2015 Document Reviewed: 01/25/2015 Elsevier Interactive Patient Education  2018 [employer].

## 2017-11-22 NOTE — Progress Notes (Signed)
Reviewed  Gail R Lowne Chase, DO  

## 2017-11-28 NOTE — Progress Notes (Addendum)
[facility] [address], [address] [place], [place] [address] [phone]  Date:  12/02/2017   Name:  Gail Fuentes   DOB:  1945/10/28   MRN:  [id number]  PCP:  Pearline Cables, MD    Chief Complaint: Annual Exam (no concerns, med refill on prilosec)   History of Present Illness:  Gail Fuentes is a 72 y.o. very pleasant female patient who presents with the following:  Here today for a CPE History of obesity, HTN, hyperparathyroidism, osteoporosis, hyperlipididemia Last seen here by myself about a year ago, but she did see Ramon Dredge last month for a sinus infection- this is resolved but she does seem to have some PND still which bothers her most at night   BP Readings from Last 3 Encounters:  12/02/17 116/78  11/22/17 130/70  11/15/17 121/73   She sees Dr. Elvera Lennox for endocrinology, per her recent visit with her:  1. Primary Hyperparathyroidism: -Patient with history of primary hyperparathyroidism, with minimally elevated calcium but high PTH levels in the past.  She is now status post parathyroidectomy in 02/2013 by Dr. Gerrit Friends.  0.5 g adenoma was resected at that time.  Calcium and PTH levels normalized afterwards.  She is usually having annual calcium levels checked and these have been normal postop. - last calcium also normal in 02/2017: Recent Labs       Lab Results  Component Value Date   CALCIUM 9.8 02/13/2017   CALCIUM 9.3 11/15/2016     2. R thyroid nodule - She had a right 7 mm thyroid nodule with internal calcification, which was stable or even smaller on the latest thyroid ultrasound  - no neck compression symptoms - No follow-up ultrasound needed, will follow her clinically 3. Hypothyroidism - latest thyroid labs reviewed with pt >> normal 1 year ago - she continues on LT4 100 mcg 6 out of 7 days and 50 mcg 1 out of 7 days - pt feels good on this dose. - we discussed about taking the thyroid  hormone every day, with water, >30 minutes before breakfast, separated by >4 hours from acid reflux medications, calcium, iron, multivitamins. Pt. is taking it correctly. - will check thyroid tests today: TSH and fT4 - If labs are abnormal, she will need to return for repeat TFTs in 1.5 months 4. Vit D deficiency - continues vitamin D 2000 units daily -We will check a new level today 5.  Osteoporosis  - reviewed previous DXA scan reports >>  osteoporotic radius score before her parathyroid surgery.  Results of the bone density scan from last year showed an improved set of T-scores, however, these were checked on another DXA machine - will recheck a DXA scan including appendicular skeleton next year on the same machine  She sees Dr. Jennette Kettle for her GYN care  At her last visit she had wanted to taper off her prozac. However she ended up continuing to take it, and feels that is is working fine. She has discussed this with DR. Neal as well.  She went back to taking it every day and is doing fine on this regimen  She is taking prilosec prn- about every other day, does need a RF of same  mammo is UTD S/p hysterectomy  Tetanus, pneumonia vaccines are UTD Shingles vaccine: pt is not sure if she wants to get this, discussed with he; she she is still thinking about this  dexa scan 2014-  we be done next year per Dr. Elvera Lennox Hep C testing is done Colonoscopy 2016 Last labs:  Varies, some done by Dr. Reece Agar  Pt is taking an oral B12 - offered to check a level but she declines today.   BP Readings from Last 3 Encounters:  12/02/17 116/78  11/22/17 130/70  11/15/17 121/73   Her family is doing ok She wonders if she could come off her HCTZ- discussed with her, can try as her BP is well controlled today She is taking hctz 12.5 and losartan 50 mg - will continue her losartan  Her husband is retiring from his job next month- he works in Insurance account manager, Airline pilot and delivery of large business signs  He is 72 yo so  she is glad that he is retiring soon   She does take a xanax on rare occasion She is on prilosec, synthroid, hctz and losartan, and prozac   No falls No CP or SOB Admits she does not get a lot of exercise   Patient Active Problem List   Diagnosis Date Noted  . History of hyperparathyroidism 08/24/2014  . Obesity (BMI 30-39.9) 10/08/2013  . HTN (hypertension) 07/13/2013  . Elevated blood pressure reading without diagnosis of hypertension 06/29/2013  . Osteoporosis 11/14/2012  . Acute iritis of both eyes 10/17/2012  . Thyroid nodule 10/17/2012  . Cardiomegaly 02/20/2012  . DISTURBANCES OF SENSATION OF SMELL AND TASTE 04/19/2010  . Vitamin D deficiency 12/24/2008  . ANXIETY 12/24/2008  . DEPRESSION 12/24/2008  . BENIGN PAROXYSMAL POSITIONAL VERTIGO 12/24/2008  . HYPERGLYCEMIA 12/24/2008  . Nonspecific abnormal electrocardiogram (ECG) (EKG) 12/24/2008  . Hyperlipidemia 07/23/2007  . Hypothyroidism 07/22/2007  . GERD 07/22/2007  . DIVERTICULITIS, HX OF 07/22/2007    Past Medical History:  Diagnosis Date  . Abnormal EKG    per patient- states evaluated by Dr Daleen Squibb in the past  . Allergy    sneezing  late Fall  . Anemia 01/23/12   H/H 11.7/35  . Anxiety state, unspecified   . Arthritis   . Benign paroxysmal positional vertigo   . Depression   . Depressive disorder, not elsewhere classified   . Diverticulosis   . Esophageal reflux   . Esophageal stricture   . Gallstones   . Heart murmur   . Hiatal hernia   . History of hyperparathyroidism   . History of UTI   . HTN (hypertension)   . Hyperlipidemia   . Other abnormal glucose   . Personal history of other diseases of digestive system    12-15 since 1990, no recurrence 2006  . PONV (postoperative nausea and vomiting)   . Unspecified hypothyroidism   . Vitamin D deficiency     Past Surgical History:  Procedure Laterality Date  . CESAREAN SECTION  1977  . CHOLECYSTECTOMY  2004  . COLONOSCOPY  12/2003   Tics  .  DENTAL SURGERY  1962, 2012   Age 47-Abscessed Teeth Extracted   . PARATHYROIDECTOMY Left 02/13/2013   Procedure: LEFT INFERIOR PARATHYROIDECTOMY with frozen section ;  Surgeon: Velora Heckler, MD;  Location: [facility];  Service: General;  Laterality: Left;  . TOTAL ABDOMINAL HYSTERECTOMY W/ BILATERAL SALPINGOOPHORECTOMY  2005   Ovarian Cyst    Social History   Tobacco Use  . Smoking status: Former Smoker    Last attempt to quit: 05/07/1973    Years since quitting: 44.6  . Smokeless tobacco: Never Used  . Tobacco comment: Smoked 1968-1975 up to 2 ppd  Substance Use Topics  .  Alcohol use: Yes    Alcohol/week: 8.4 oz    Types: 14 Standard drinks or equivalent per week    Comment: 2 nightly wines  . Drug use: No    Family History  Problem Relation Age of Onset  . Coronary artery disease Father   . Heart attack Father 73  . COPD Father   . Diabetes Mother   . Dysrhythmia Mother   . Depression Mother   . COPD Mother   . Colon polyps Mother   . Kidney disease Mother   . Prostate cancer Brother   . Other Brother        Wegener's in sinus; [facility]  . Heart disease Brother   . Arthritis Brother   . Hypertension Brother   . Prostate cancer Brother   . Cancer Brother        Soft tissue in the face/temple  . Stroke Maternal Aunt        >65  . Stroke Maternal Grandmother        >65  . Stroke Maternal Grandfather 47    Allergies  Allergen Reactions  . Augmentin [Amoxicillin-Pot Clavulanate] Rash    Medication list has been reviewed and updated.  Current Outpatient Medications on File Prior to Visit  Medication Sig Dispense Refill  . ALPRAZolam (XANAX) 0.5 MG tablet Take 0.5 mg by mouth 3 (three) times daily as needed for anxiety.     . Cholecalciferol (VITAMIN D3) 2000 UNITS TABS Take 2 tablets by mouth daily.     . cyanocobalamin 100 MCG tablet Take 100 mcg by mouth daily.    . fexofenadine (ALLEGRA) 180 MG tablet Take 180 mg by mouth as needed for allergies. Reported on  07/18/2015    . FLUoxetine (PROZAC) 40 MG capsule Take 40 mg by mouth every morning.     . hydrochlorothiazide (MICROZIDE) 12.5 MG capsule TAKE 1 CAPSULE DAILY 30 capsule 0  . losartan (COZAAR) 50 MG tablet Take 1 tablet (50 mg total) by mouth daily. 90 tablet 3  . SYNTHROID 100 MCG tablet TAKE ONE TABLET DAILY AND TAKE ONE-HALF (1/2) TABLET ON SUNDAY, 90 tablet 3   No current facility-administered medications on file prior to visit.     Review of Systems:  As per HPI- otherwise negative. No history of glaucoma   Physical Examination: Vitals:   12/02/17 0850  BP: 116/78  Pulse: 61  Resp: 16  Temp: 97.6 F (36.4 C)  SpO2: 97%   Vitals:   12/02/17 0850  Weight: 209 lb (94.8 kg)  Height: 5\' 5"  (1.651 m)   Body mass index is 34.78 kg/m. Ideal Body Weight: Weight in (lb) to have BMI = 25: 149.9  GEN: WDWN, NAD, Non-toxic, A & O x 3, obese, looks well  HEENT: Atraumatic, Normocephalic. Neck supple. No masses, No LAD.  Bilateral TM wnl, oropharynx normal.  PEERL,EOMI.   Ears and Nose: No external deformity. CV: RRR, No M/G/R. No JVD. No thrill. No extra heart sounds. PULM: CTA B, no wheezes, crackles, rhonchi. No retractions. No resp. distress. No accessory muscle use. ABD: S, NT, ND. No rebound. No HSM. EXTR: No c/c/e NEURO Normal gait.  PSYCH: Normally interactive. Conversant. Not depressed or anxious appearing.  Calm demeanor.    Assessment and Plan: Physical exam - Plan: CBC  Essential hypertension - Plan: CBC  Dysthymia  Mixed hyperlipidemia - Plan: Lipid panel  Gastroesophageal reflux disease, esophagitis presence not specified - Plan: omeprazole (PRILOSEC) 20 MG capsule  PND (post-nasal drip) -  Plan: ipratropium (ATROVENT) 0.03 % nasal spray  CPE today Labs pending as above- some of her labs are already UTD per endocrinology Offered shingrix but she declines for now Will try atrovent nasal for PND sx  Encouraged exercise to keep her strong and active She  will try holding her HCTZ and see if BP remains at goal  Will plan further follow- up pending labs.   Signed Abbe Amsterdam, MD  Received her labs 7/30-  Look good, let's visit in 6 months  Results for orders placed or performed in visit on 12/02/17  CBC  Result Value Ref Range   WBC 5.5 4.0 - 10.5 K/uL   RBC 4.41 3.87 - 5.11 Mil/uL   Platelets 317.0 150.0 - 400.0 K/uL   Hemoglobin 13.9 12.0 - 15.0 g/dL   HCT 16.1 09.6 - 04.5 %   MCV 93.4 78.0 - 100.0 fl   MCHC 33.6 30.0 - 36.0 g/dL   RDW 40.9 81.1 - 91.4 %  Lipid panel  Result Value Ref Range   Cholesterol 230 (H) 0 - 200 mg/dL   Triglycerides 78.2 0.0 - 149.0 mg/dL   HDL 95.62 >13.08 mg/dL   VLDL 65.7 0.0 - 84.6 mg/dL   LDL Cholesterol 962 (H) 0 - 99 mg/dL   Total CHOL/HDL Ratio 3    NonHDL 152.38

## 2017-12-02 ENCOUNTER — Ambulatory Visit (INDEPENDENT_AMBULATORY_CARE_PROVIDER_SITE_OTHER): Payer: Medicare Other | Admitting: Family Medicine

## 2017-12-02 ENCOUNTER — Encounter: Payer: Self-pay | Admitting: Family Medicine

## 2017-12-02 VITALS — BP 116/78 | HR 61 | Temp 97.6°F | Resp 16 | Ht 65.0 in | Wt 209.0 lb

## 2017-12-02 DIAGNOSIS — I10 Essential (primary) hypertension: Secondary | ICD-10-CM

## 2017-12-02 DIAGNOSIS — R0982 Postnasal drip: Secondary | ICD-10-CM

## 2017-12-02 DIAGNOSIS — E782 Mixed hyperlipidemia: Secondary | ICD-10-CM | POA: Diagnosis not present

## 2017-12-02 DIAGNOSIS — Z0000 Encounter for general adult medical examination without abnormal findings: Secondary | ICD-10-CM

## 2017-12-02 DIAGNOSIS — F341 Dysthymic disorder: Secondary | ICD-10-CM

## 2017-12-02 DIAGNOSIS — K219 Gastro-esophageal reflux disease without esophagitis: Secondary | ICD-10-CM

## 2017-12-02 LAB — CBC
HCT: 41.2 % (ref 36.0–46.0)
HEMOGLOBIN: 13.9 g/dL (ref 12.0–15.0)
MCHC: 33.6 g/dL (ref 30.0–36.0)
MCV: 93.4 fl (ref 78.0–100.0)
PLATELETS: 317 10*3/uL (ref 150.0–400.0)
RBC: 4.41 Mil/uL (ref 3.87–5.11)
RDW: 13.8 % (ref 11.5–15.5)
WBC: 5.5 10*3/uL (ref 4.0–10.5)

## 2017-12-02 LAB — LIPID PANEL
CHOL/HDL RATIO: 3
Cholesterol: 230 mg/dL — ABNORMAL HIGH (ref 0–200)
HDL: 77.5 mg/dL (ref >39.00)
LDL Cholesterol: 137 mg/dL — ABNORMAL HIGH (ref 0–99)
NONHDL: 152.38
TRIGLYCERIDES: 78 mg/dL (ref 0.0–149.0)
VLDL: 15.6 mg/dL (ref 0.0–40.0)

## 2017-12-02 MED ORDER — OMEPRAZOLE 20 MG PO CPDR: 20.0000 mg | DELAYED_RELEASE_CAPSULE | Freq: Every day | ORAL | 3 refills | Status: DC

## 2017-12-02 MED ORDER — IPRATROPIUM BROMIDE 0.03 % NA SOLN: NASAL | 6 refills | Status: DC

## 2017-12-02 NOTE — Patient Instructions (Addendum)
If you want to try coming off your HCTZ that is ok- keep an eye on your BP. If you start running higher than 140/90 re-start the medication Continue taking losartan Let's have you try atrovent nasal as needed for throat clearing/ post nasal drip.  You can use this up to 4x a day as needed- if not helpful you can stop using this  Please do work on getting a bit more exercise- staying active and strong will serve you well as you get older.  Walking, taking some senior exercise classes, and water aerobics are all good ideas    Health Maintenance for Postmenopausal Women Menopause is a normal process in which your reproductive ability comes to an end. This process happens gradually over a span of months to years, usually between the ages of 59 and 65. Menopause is complete when you have missed 12 consecutive menstrual periods. It is important to talk with your health care provider about some of the most common conditions that affect postmenopausal women, such as heart disease, cancer, and bone loss (osteoporosis). Adopting a healthy lifestyle and getting preventive care can help to promote your health and wellness. Those actions can also lower your chances of developing some of these common conditions. What should I know about menopause? During menopause, you may experience a number of symptoms, such as:  Moderate-to-severe hot flashes.  Night sweats.  Decrease in sex drive.  Mood swings.  Headaches.  Tiredness.  Irritability.  Memory problems.  Insomnia.  Choosing to treat or not to treat menopausal changes is an individual decision that you make with your health care provider. What should I know about hormone replacement therapy and supplements? Hormone therapy products are effective for treating symptoms that are associated with menopause, such as hot flashes and night sweats. Hormone replacement carries certain risks, especially as you become older. If you are thinking about using  estrogen or estrogen with progestin treatments, discuss the benefits and risks with your health care provider. What should I know about heart disease and stroke? Heart disease, heart attack, and stroke become more likely as you age. This may be due, in part, to the hormonal changes that your body experiences during menopause. These can affect how your body processes dietary fats, triglycerides, and cholesterol. Heart attack and stroke are both medical emergencies. There are many things that you can do to help prevent heart disease and stroke:  Have your blood pressure checked at least every 1-2 years. High blood pressure causes heart disease and increases the risk of stroke.  If you are 57-48 years old, ask your health care provider if you should take aspirin to prevent a heart attack or a stroke.  Do not use any tobacco products, including cigarettes, chewing tobacco, or electronic cigarettes. If you need help quitting, ask your health care provider.  It is important to eat a healthy diet and maintain a healthy weight. ? Be sure to include plenty of vegetables, fruits, low-fat dairy products, and lean protein. ? Avoid eating foods that are high in solid fats, added sugars, or salt (sodium).  Get regular exercise. This is one of the most important things that you can do for your health. ? Try to exercise for at least 150 minutes each week. The type of exercise that you do should increase your heart rate and make you sweat. This is known as moderate-intensity exercise. ? Try to do strengthening exercises at least twice each week. Do these in addition to the moderate-intensity exercise.  Know your numbers.Ask your health care provider to check your cholesterol and your blood glucose. Continue to have your blood tested as directed by your health care provider.  What should I know about cancer screening? There are several types of cancer. Take the following steps to reduce your risk and to catch  any cancer development as early as possible. Breast Cancer  Practice breast self-awareness. ? This means understanding how your breasts normally appear and feel. ? It also means doing regular breast self-exams. Let your health care provider know about any changes, no matter how small.  If you are 75 or older, have a clinician do a breast exam (clinical breast exam or CBE) every year. Depending on your age, family history, and medical history, it may be recommended that you also have a yearly breast X-ray (mammogram).  If you have a family history of breast cancer, talk with your health care provider about genetic screening.  If you are at high risk for breast cancer, talk with your health care provider about having an MRI and a mammogram every year.  Breast cancer (BRCA) gene test is recommended for women who have family members with BRCA-related cancers. Results of the assessment will determine the need for genetic counseling and BRCA1 and for BRCA2 testing. BRCA-related cancers include these types: ? Breast. This occurs in males or females. ? Ovarian. ? Tubal. This may also be called fallopian tube cancer. ? Cancer of the abdominal or pelvic lining (peritoneal cancer). ? Prostate. ? Pancreatic.  Cervical, Uterine, and Ovarian Cancer Your health care provider may recommend that you be screened regularly for cancer of the pelvic organs. These include your ovaries, uterus, and vagina. This screening involves a pelvic exam, which includes checking for microscopic changes to the surface of your cervix (Pap test).  For women ages 21-65, health care providers may recommend a pelvic exam and a Pap test every three years. For women ages 64-65, they may recommend the Pap test and pelvic exam, combined with testing for human papilloma virus (HPV), every five years. Some types of HPV increase your risk of cervical cancer. Testing for HPV may also be done on women of any age who have unclear Pap test  results.  Other health care providers may not recommend any screening for nonpregnant women who are considered low risk for pelvic cancer and have no symptoms. Ask your health care provider if a screening pelvic exam is right for you.  If you have had past treatment for cervical cancer or a condition that could lead to cancer, you need Pap tests and screening for cancer for at least 20 years after your treatment. If Pap tests have been discontinued for you, your risk factors (such as having a new sexual partner) need to be reassessed to determine if you should start having screenings again. Some women have medical problems that increase the chance of getting cervical cancer. In these cases, your health care provider may recommend that you have screening and Pap tests more often.  If you have a family history of uterine cancer or ovarian cancer, talk with your health care provider about genetic screening.  If you have vaginal bleeding after reaching menopause, tell your health care provider.  There are currently no reliable tests available to screen for ovarian cancer.  Lung Cancer Lung cancer screening is recommended for adults 25-57 years old who are at high risk for lung cancer because of a history of smoking. A yearly low-dose CT scan of the  lungs is recommended if you:  Currently smoke.  Have a history of at least 30 pack-years of smoking and you currently smoke or have quit within the past 15 years. A pack-year is smoking an average of one pack of cigarettes per day for one year.  Yearly screening should:  Continue until it has been 15 years since you quit.  Stop if you develop a health problem that would prevent you from having lung cancer treatment.  Colorectal Cancer  This type of cancer can be detected and can often be prevented.  Routine colorectal cancer screening usually begins at age 63 and continues through age 33.  If you have risk factors for colon cancer, your health  care provider may recommend that you be screened at an earlier age.  If you have a family history of colorectal cancer, talk with your health care provider about genetic screening.  Your health care provider may also recommend using home test kits to check for hidden blood in your stool.  A small camera at the end of a tube can be used to examine your colon directly (sigmoidoscopy or colonoscopy). This is done to check for the earliest forms of colorectal cancer.  Direct examination of the colon should be repeated every 5-10 years until age 55. However, if early forms of precancerous polyps or small growths are found or if you have a family history or genetic risk for colorectal cancer, you may need to be screened more often.  Skin Cancer  Check your skin from head to toe regularly.  Monitor any moles. Be sure to tell your health care provider: ? About any new moles or changes in moles, especially if there is a change in a mole's shape or color. ? If you have a mole that is larger than the size of a pencil eraser.  If any of your family members has a history of skin cancer, especially at a young age, talk with your health care provider about genetic screening.  Always use sunscreen. Apply sunscreen liberally and repeatedly throughout the day.  Whenever you are outside, protect yourself by wearing long sleeves, pants, a wide-brimmed hat, and sunglasses.  What should I know about osteoporosis? Osteoporosis is a condition in which bone destruction happens more quickly than new bone creation. After menopause, you may be at an increased risk for osteoporosis. To help prevent osteoporosis or the bone fractures that can happen because of osteoporosis, the following is recommended:  If you are 63-33 years old, get at least 1,000 mg of calcium and at least 600 mg of vitamin D per day.  If you are older than age 37 but younger than age 73, get at least 1,200 mg of calcium and at least 600 mg of  vitamin D per day.  If you are older than age 73, get at least 1,200 mg of calcium and at least 800 mg of vitamin D per day.  Smoking and excessive alcohol intake increase the risk of osteoporosis. Eat foods that are rich in calcium and vitamin D, and do weight-bearing exercises several times each week as directed by your health care provider. What should I know about how menopause affects my mental health? Depression may occur at any age, but it is more common as you become older. Common symptoms of depression include:  Low or sad mood.  Changes in sleep patterns.  Changes in appetite or eating patterns.  Feeling an overall lack of motivation or enjoyment of activities that you previously  enjoyed.  Frequent crying spells.  Talk with your health care provider if you think that you are experiencing depression. What should I know about immunizations? It is important that you get and maintain your immunizations. These include:  Tetanus, diphtheria, and pertussis (Tdap) booster vaccine.  Influenza every year before the flu season begins.  Pneumonia vaccine.  Shingles vaccine.  Your health care provider may also recommend other immunizations. This information is not intended to replace advice given to you by your health care provider. Make sure you discuss any questions you have with your health care provider. Document Released: 06/15/2005 Document Revised: 11/11/2015 Document Reviewed: 01/25/2015 Elsevier Interactive Patient Education  2018 [employer].

## 2017-12-03 ENCOUNTER — Encounter: Payer: Self-pay | Admitting: Family Medicine

## 2018-02-13 ENCOUNTER — Encounter: Payer: Self-pay | Admitting: Cardiovascular Disease

## 2018-02-13 ENCOUNTER — Ambulatory Visit (INDEPENDENT_AMBULATORY_CARE_PROVIDER_SITE_OTHER): Payer: Medicare Other | Admitting: Cardiovascular Disease

## 2018-02-13 VITALS — BP 110/70 | HR 59 | Ht 65.0 in | Wt 210.8 lb

## 2018-02-13 DIAGNOSIS — I119 Hypertensive heart disease without heart failure: Secondary | ICD-10-CM | POA: Diagnosis not present

## 2018-02-13 NOTE — Patient Instructions (Signed)

## 2018-02-13 NOTE — Progress Notes (Signed)
Chief Complaint  Patient presents with  . Follow-up    Diastolic dysfunction   History of Present Illness: 72 yo female with history of HTN, HLD, GERD, anxiety, depression, hyperparathyroidism who is here today for cardiac follow up. She was followed remotely in our office by Dr. Verl Blalock for an abnormal EKG. Echo 2011 with normal LV function, no valve issues. I met her in January 2018. She reported occasional dyspnea. Her EKG showed poor R wave progression in the precordial leads and possible inferior Q waves, all unchanged from 2009. Echo February 2018 with normal LV systolic function, grade 1 diastolic dysfunction, mild LVH, no valve disease. She had no complaints at her last visit in our office.   She is here today for follow up. The patient denies any chest pain, dyspnea, palpitations, lower extremity edema, orthopnea, PND, dizziness, near syncope or syncope. She has been busy helping her brother remodel a house. She feel great.   Primary Care Physician: Darreld Mclean, MD  Past Medical History:  Diagnosis Date  . Abnormal EKG    per patient- states evaluated by Dr Verl Blalock in the past  . Allergy    sneezing  late Fall  . Anemia 01/23/12   H/H 11.7/35  . Anxiety state, unspecified   . Arthritis   . Benign paroxysmal positional vertigo   . Depression   . Depressive disorder, not elsewhere classified   . Diverticulosis   . Esophageal reflux   . Esophageal stricture   . Gallstones   . Heart murmur   . Hiatal hernia   . History of hyperparathyroidism   . History of UTI   . HTN (hypertension)   . Hyperlipidemia   . Other abnormal glucose   . Personal history of other diseases of digestive system    12-15 since 1990, no recurrence 2006  . PONV (postoperative nausea and vomiting)   . Unspecified hypothyroidism   . Vitamin D deficiency     Past Surgical History:  Procedure Laterality Date  . [place]  . CHOLECYSTECTOMY  2004  . COLONOSCOPY  12/2003   Tics    . DENTAL SURGERY  1962, 2012   Age 21-Abscessed Teeth Extracted   . PARATHYROIDECTOMY Left 02/13/2013   Procedure: LEFT INFERIOR PARATHYROIDECTOMY with frozen section ;  Surgeon: Earnstine Regal, MD;  Location: [facility];  Service: General;  Laterality: Left;  . TOTAL ABDOMINAL HYSTERECTOMY W/ BILATERAL SALPINGOOPHORECTOMY  2005   Ovarian Cyst    Current Outpatient Medications  Medication Sig Dispense Refill  . ALPRAZolam (XANAX) 0.5 MG tablet Take 0.5 mg by mouth 3 (three) times daily as needed for anxiety.     . Cholecalciferol (VITAMIN D3) 2000 UNITS TABS Take 2 tablets by mouth daily.     . cyanocobalamin 100 MCG tablet Take 100 mcg by mouth daily.    . fexofenadine (ALLEGRA) 180 MG tablet Take 180 mg by mouth as needed for allergies. Reported on 07/18/2015    . FLUoxetine (PROZAC) 40 MG capsule Take 40 mg by mouth every morning.     . hydrochlorothiazide (MICROZIDE) 12.5 MG capsule TAKE 1 CAPSULE DAILY 30 capsule 0  . ipratropium (ATROVENT) 0.03 % nasal spray Place 2 sprays in each nostril up to 4x a day for post nasal drip 30 mL 6  . losartan (COZAAR) 50 MG tablet Take 1 tablet (50 mg total) by mouth daily. 90 tablet 3  . omeprazole (PRILOSEC) 20 MG capsule Take 1 capsule (20 mg  total) by mouth daily. 90 capsule 3  . SYNTHROID 100 MCG tablet TAKE ONE TABLET DAILY AND TAKE ONE-HALF (1/2) TABLET ON SUNDAY, 90 tablet 3   No current facility-administered medications for this visit.     Allergies  Allergen Reactions  . Augmentin [Amoxicillin-Pot Clavulanate] Rash    Social History   Socioeconomic History  . Marital status: Married    Spouse name: Not on file  . Number of children: 1  . Years of education: Not on file  . Highest education level: Not on file  Occupational History  . Occupation: Administrator, [employer] and homes    Employer: RETIRED  . Occupation: retired Air cabin crew  Social Needs  . Financial resource strain: Not on file  . Food insecurity:    Worry: Not on file     Inability: Not on file  . Transportation needs:    Medical: Not on file    Non-medical: Not on file  Tobacco Use  . Smoking status: Former Smoker    Last attempt to quit: 05/07/1973    Years since quitting: 44.8  . Smokeless tobacco: Never Used  . Tobacco comment: Smoked 1968-1975 up to 2 ppd  Substance and Sexual Activity  . Alcohol use: Yes    Alcohol/week: 14.0 standard drinks    Types: 14 Standard drinks or equivalent per week    Comment: 2 nightly wines  . Drug use: No  . Sexual activity: Yes  Lifestyle  . Physical activity:    Days per week: Not on file    Minutes per session: Not on file  . Stress: Not on file  Relationships  . Social connections:    Talks on phone: Not on file    Gets together: Not on file    Attends religious service: Not on file    Active member of club or organization: Not on file    Attends meetings of clubs or organizations: Not on file    Relationship status: Not on file  . Intimate partner violence:    Fear of current or ex partner: Not on file    Emotionally abused: Not on file    Physically abused: Not on file    Forced sexual activity: Not on file  Other Topics Concern  . Not on file  Social History Narrative   Regular exercise: no   Caffeine use: daily    Family History  Problem Relation Age of Onset  . Coronary artery disease Father   . Heart attack Father 6  . COPD Father   . Diabetes Mother   . Dysrhythmia Mother   . Depression Mother   . COPD Mother   . Colon polyps Mother   . Kidney disease Mother   . Prostate cancer Brother   . Other Brother        Wegener's in sinus; [place]  . Heart disease Brother   . Arthritis Brother   . Hypertension Brother   . Prostate cancer Brother   . Cancer Brother        Soft tissue in the face/temple  . Stroke Maternal Aunt        >65  . Stroke Maternal Grandmother        >65  . Stroke Maternal Grandfather 47    Review of Systems:  As stated in the HPI and otherwise negative.    BP 110/70   Pulse (!) 59   Ht 5' 5"  (1.651 m)   Wt 210 lb 12.8 oz (95.6 kg)  BMI 35.08 kg/m   Physical Examination: General: Well developed, well nourished, NAD  HEENT: OP clear, mucus membranes moist  SKIN: warm, dry. No rashes. Neuro: No focal deficits  Musculoskeletal: Muscle strength 5/5 all ext  Psychiatric: Mood and affect normal  Neck: No JVD, no carotid bruits, no thyromegaly, no lymphadenopathy.  Lungs:Clear bilaterally, no wheezes, rhonci, crackles Cardiovascular: Regular rate and rhythm. No murmurs, gallops or rubs. Abdomen:Soft. Bowel sounds present. Non-tender.  Extremities: No lower extremity edema. Pulses are 2 + in the bilateral DP/PT.  Echo February 2018:  Left ventricle: The cavity size was normal. Wall thickness was   increased in a pattern of mild LVH. Systolic function was normal.   The estimated ejection fraction was in the range of 60% to 65%.   Wall motion was normal; there were no regional wall motion   abnormalities. Doppler parameters are consistent with abnormal   left ventricular relaxation (grade 1 diastolic dysfunction).   Doppler parameters are consistent with high ventricular filling   pressure.  Impressions:  - Normal LV systolic function; grade 1 diastolic dysfunction with   elevated LV filling pressure; mild LVH.  EKG:  EKG is  ordered today. The ekg ordered today demonstrates Sinus brady, rate 59 bpm. Inferior Q waves. Poor R wave progression. Unchanged.   Recent Labs: 11/15/2017: BUN 10; Creat 0.75; Potassium 4.4; Sodium 139; TSH 0.99 12/02/2017: Hemoglobin 13.9; Platelets 317.0   Lipid Panel    Component Value Date/Time   CHOL 230 (H) 12/02/2017 0924   CHOL 236 (H) 10/12/2014 1046   TRIG 78.0 12/02/2017 0924   TRIG 109 10/12/2014 1046   TRIG 91 05/13/2006 1031   HDL 77.50 12/02/2017 0924   HDL 90 10/12/2014 1046   CHOLHDL 3 12/02/2017 0924   VLDL 15.6 12/02/2017 0924   LDLCALC 137 (H) 12/02/2017 0924   LDLCALC 124 (H)  10/12/2014 1046   LDLDIRECT 142.7 12/27/2008 1035     Wt Readings from Last 3 Encounters:  02/13/18 210 lb 12.8 oz (95.6 kg)  12/02/17 209 lb (94.8 kg)  11/22/17 210 lb 9.6 oz (95.5 kg)     Other studies Reviewed: Additional studies/ records that were reviewed today include: . Review of the above records demonstrates:   Assessment and Plan:   1. Hypertensive heart disease: Echo February 2018 with mild LVH, grade 1 diastolic dysfunction. Her BP is well controlled today. Continue current medications.     Current medicines are reviewed at length with the patient today.  The patient does not have concerns regarding medicines.  The following changes have been made:  no change  Labs/ tests ordered today include:   Orders Placed This Encounter  Procedures  . EKG 12-Lead    Disposition:   FU with me in 12  months  Signed, Lauree Chandler, Jerilynn Mages 02/13/2018 4:24 PM    [facility] [place], [place], [place]  [address] Phone: [phone]; Fax: [phone]

## 2018-02-17 ENCOUNTER — Other Ambulatory Visit: Payer: Self-pay | Admitting: Family Medicine

## 2018-02-17 MED ORDER — HYDROCHLOROTHIAZIDE 12.5 MG PO CAPS: 12.5000 mg | ORAL_CAPSULE | Freq: Every day | ORAL | 6 refills | Status: DC

## 2018-02-24 ENCOUNTER — Telehealth: Payer: Self-pay

## 2018-02-24 MED ORDER — HYDROCHLOROTHIAZIDE 12.5 MG PO CAPS
12.5000 mg | ORAL_CAPSULE | Freq: Every day | ORAL | 2 refills | Status: DC
Start: 2018-02-24 — End: 2019-12-10

## 2018-02-24 MED ORDER — HYDROCHLOROTHIAZIDE 12.5 MG PO CAPS: 12.5000 mg | ORAL_CAPSULE | Freq: Every day | ORAL | 2 refills | Status: DC

## 2018-02-24 NOTE — Telephone Encounter (Signed)
Received fax from [facility] requesting 90 day supply. Refill has been sent in to [facility] with 3 month supply per insurance request.

## 2018-02-24 NOTE — Telephone Encounter (Signed)
Copied from CRM [id number]. Topic: General - Other >> Feb 24, 2018  8:55 AM Percival Spanish wrote:  Pt call to say [facility] contacted her saying they have questions about  hydrochlorothiazide (MICROZIDE) 12.5 MG capsule and would like a call back from the office .They did not tell pt what the questions was

## 2018-02-26 ENCOUNTER — Ambulatory Visit: Payer: Self-pay | Admitting: Cardiovascular Disease

## 2018-02-28 ENCOUNTER — Encounter: Payer: Self-pay | Admitting: Family

## 2018-02-28 ENCOUNTER — Ambulatory Visit (INDEPENDENT_AMBULATORY_CARE_PROVIDER_SITE_OTHER): Payer: Medicare Other | Admitting: Family

## 2018-02-28 VITALS — BP 112/74 | HR 59 | Temp 98.4°F | Ht 65.0 in | Wt 209.0 lb

## 2018-02-28 DIAGNOSIS — R059 Cough, unspecified: Secondary | ICD-10-CM

## 2018-02-28 DIAGNOSIS — R05 Cough: Secondary | ICD-10-CM

## 2018-02-28 MED ORDER — BENZONATATE 100 MG PO CAPS: 100.0000 mg | ORAL_CAPSULE | Freq: Three times a day (TID) | ORAL | 0 refills | Status: DC | PRN

## 2018-02-28 MED ORDER — DOXYCYCLINE HYCLATE 100 MG PO TABS: 100.0000 mg | ORAL_TABLET | Freq: Two times a day (BID) | ORAL | 0 refills | Status: DC

## 2018-02-28 NOTE — Progress Notes (Signed)
CLAUDETTE WERMUTH is a 72 y.o. female with the following history as recorded in [employer]:  Patient Active Problem List   Diagnosis Date Noted  . History of hyperparathyroidism 08/24/2014  . Obesity (BMI 30-39.9) 10/08/2013  . HTN (hypertension) 07/13/2013  . Elevated blood pressure reading without diagnosis of hypertension 06/29/2013  . Osteoporosis 11/14/2012  . Acute iritis of both eyes 10/17/2012  . Thyroid nodule 10/17/2012  . Cardiomegaly 02/20/2012  . DISTURBANCES OF SENSATION OF SMELL AND TASTE 04/19/2010  . Vitamin D deficiency 12/24/2008  . ANXIETY 12/24/2008  . DEPRESSION 12/24/2008  . BENIGN PAROXYSMAL POSITIONAL VERTIGO 12/24/2008  . HYPERGLYCEMIA 12/24/2008  . Nonspecific abnormal electrocardiogram (ECG) (EKG) 12/24/2008  . Hyperlipidemia 07/23/2007  . Hypothyroidism 07/22/2007  . GERD 07/22/2007  . DIVERTICULITIS, HX OF 07/22/2007    Current Outpatient Medications  Medication Sig Dispense Refill  . ALPRAZolam (XANAX) 0.5 MG tablet Take 0.5 mg by mouth 3 (three) times daily as needed for anxiety.     . Cholecalciferol (VITAMIN D3) 2000 UNITS TABS Take 2 tablets by mouth daily.     . cyanocobalamin 100 MCG tablet Take 100 mcg by mouth daily.    . fexofenadine (ALLEGRA) 180 MG tablet Take 180 mg by mouth as needed for allergies. Reported on 07/18/2015    . FLUoxetine (PROZAC) 40 MG capsule Take 40 mg by mouth every morning.     . hydrochlorothiazide (MICROZIDE) 12.5 MG capsule Take 1 capsule (12.5 mg total) by mouth daily. 90 capsule 2  . ipratropium (ATROVENT) 0.03 % nasal spray Place 2 sprays in each nostril up to 4x a day for post nasal drip 30 mL 6  . losartan (COZAAR) 50 MG tablet Take 1 tablet (50 mg total) by mouth daily. 90 tablet 3  . omeprazole (PRILOSEC) 20 MG capsule Take 1 capsule (20 mg total) by mouth daily. 90 capsule 3  . SYNTHROID 100 MCG tablet TAKE ONE TABLET DAILY AND TAKE ONE-HALF (1/2) TABLET ON SUNDAY, 90 tablet 3  . benzonatate (TESSALON) 100  MG capsule Take 1 capsule (100 mg total) by mouth 3 (three) times daily as needed. 20 capsule 0  . doxycycline (VIBRA-TABS) 100 MG tablet Take 1 tablet (100 mg total) by mouth 2 (two) times daily. 20 tablet 0   No current facility-administered medications for this visit.     Allergies: Augmentin [amoxicillin-pot clavulanate]  Past Medical History:  Diagnosis Date  . Abnormal EKG    per patient- states evaluated by Dr Daleen Squibb in the past  . Allergy    sneezing  late Fall  . Anemia 01/23/12   H/H 11.7/35  . Anxiety state, unspecified   . Arthritis   . Benign paroxysmal positional vertigo   . Depression   . Depressive disorder, not elsewhere classified   . Diverticulosis   . Esophageal reflux   . Esophageal stricture   . Gallstones   . Heart murmur   . Hiatal hernia   . History of hyperparathyroidism   . History of UTI   . HTN (hypertension)   . Hyperlipidemia   . Other abnormal glucose   . Personal history of other diseases of digestive system    12-15 since 1990, no recurrence 2006  . PONV (postoperative nausea and vomiting)   . Unspecified hypothyroidism   . Vitamin D deficiency     Past Surgical History:  Procedure Laterality Date  . CESAREAN SECTION  1977  . CHOLECYSTECTOMY  2004  . COLONOSCOPY  12/2003   Tics  .  DENTAL SURGERY  1962, 2012   Age 5-Abscessed Teeth Extracted   . PARATHYROIDECTOMY Left 02/13/2013   Procedure: LEFT INFERIOR PARATHYROIDECTOMY with frozen section ;  Surgeon: Velora Heckler, MD;  Location: [facility];  Service: General;  Laterality: Left;  . TOTAL ABDOMINAL HYSTERECTOMY W/ BILATERAL SALPINGOOPHORECTOMY  2005   Ovarian Cyst    Family History  Problem Relation Age of Onset  . Coronary artery disease Father   . Heart attack Father 20  . COPD Father   . Diabetes Mother   . Dysrhythmia Mother   . Depression Mother   . COPD Mother   . Colon polyps Mother   . Kidney disease Mother   . Prostate cancer Brother   . Other Brother         Wegener's in sinus; [facility]  . Heart disease Brother   . Arthritis Brother   . Hypertension Brother   . Prostate cancer Brother   . Cancer Brother        Soft tissue in the face/temple  . Stroke Maternal Aunt        >65  . Stroke Maternal Grandmother        >65  . Stroke Maternal Grandfather 30    Social History   Tobacco Use  . Smoking status: Former Smoker    Last attempt to quit: 05/07/1973    Years since quitting: 44.8  . Smokeless tobacco: Never Used  . Tobacco comment: Smoked 1968-1975 up to 2 ppd  Substance Use Topics  . Alcohol use: Yes    Alcohol/week: 14.0 standard drinks    Types: 14 Standard drinks or equivalent per week    Comment: 2 nightly wines    Subjective:  Patient presents with concerns for 1 week history of cough/ congestion/ sore throat; notes that on Monday of this week felt like she was running a fever; + post-nasal drainage; right ear pain; prone to bronchitis; + sinus pressure; has been using Flonase;     Objective:  Vitals:   02/28/18 1451  BP: 112/74  Pulse: (!) 59  Temp: 98.4 F (36.9 C)  TempSrc: Oral  SpO2: 96%  Weight: 209 lb (94.8 kg)  Height: 5\' 5"  (1.651 m)    General: Well developed, well nourished, in no acute distress  Skin : Warm and dry.  Head: Normocephalic and atraumatic  Eyes: Sclera and conjunctiva clear; pupils round and reactive to light; extraocular movements intact  Ears: External normal; canals clear; tympanic membranes normal  Oropharynx: Pink, supple. No suspicious lesions  Neck: Supple without thyromegaly, adenopathy  Lungs: Respirations unlabored; clear to auscultation bilaterally without wheeze, rales, rhonchi  CVS exam: normal rate and regular rhythm.  Neurologic: Alert and oriented; speech intact; face symmetrical; moves all extremities well; CNII-XII intact without focal deficit  Assessment:  1. Cough     Plan:  Suspect acute bronchitis; Rx for Doxycycline 100 mg bid x 10 days, Tessalon Perles; use  albuterol inhaler as needed for wheezing; increase fluids, rest and follow-up worse, no better. Will refer to pulmonology per patient request for asthma evaluation.   No follow-ups on file.  Orders Placed This Encounter  Procedures  . Ambulatory referral to Pulmonology    Referral Priority:   Routine    Referral Type:   Consultation    Referral Reason:   Specialty Services Required    Requested Specialty:   Pulmonary Disease    Number of Visits Requested:   1    Requested Prescriptions  Signed Prescriptions Disp Refills  . doxycycline (VIBRA-TABS) 100 MG tablet 20 tablet 0    Sig: Take 1 tablet (100 mg total) by mouth 2 (two) times daily.  . benzonatate (TESSALON) 100 MG capsule 20 capsule 0    Sig: Take 1 capsule (100 mg total) by mouth 3 (three) times daily as needed.

## 2018-03-06 ENCOUNTER — Encounter: Payer: Self-pay | Admitting: Pulmonary Disease

## 2018-03-06 ENCOUNTER — Ambulatory Visit (HOSPITAL_BASED_OUTPATIENT_CLINIC_OR_DEPARTMENT_OTHER)
Admission: RE | Admit: 2018-03-06 | Discharge: 2018-03-06 | Disposition: A | Discharge status: Home / Self Care | Payer: Medicare Other | Source: Ambulatory Visit | Attending: Pulmonary Disease | Admitting: Pulmonary Disease

## 2018-03-06 ENCOUNTER — Ambulatory Visit (INDEPENDENT_AMBULATORY_CARE_PROVIDER_SITE_OTHER): Payer: Medicare Other | Admitting: Pulmonary Disease

## 2018-03-06 ENCOUNTER — Other Ambulatory Visit (INDEPENDENT_AMBULATORY_CARE_PROVIDER_SITE_OTHER): Payer: Medicare Other

## 2018-03-06 VITALS — BP 130/84 | HR 53 | Ht 65.0 in | Wt 208.0 lb

## 2018-03-06 DIAGNOSIS — R053 Chronic cough: Secondary | ICD-10-CM

## 2018-03-06 DIAGNOSIS — R05 Cough: Secondary | ICD-10-CM | POA: Insufficient documentation

## 2018-03-06 NOTE — Patient Instructions (Signed)
Will arrange for following lab tests: RAST with IgE, CBC with differential, CMET  Will schedule chest xray and pulmonary function test  Follow up in 4 weeks with Dr. Craige Cotta or Nurse Practitioner

## 2018-03-06 NOTE — Progress Notes (Signed)
[facility]  Chief Complaint  Patient presents with  . pulm consult    Pt referred by Ria Clock NP. Pt has productive cough-white/yellow, wheezing, and sinus pressure and drainage. Pt feels congested in her lungs, has history of bronchitisis    Constitutional:  BP 130/84 (BP Location: Left Arm, Cuff Size: Normal)   Pulse (!) 53   Ht 5\' 5"  (1.651 m)   Wt 208 lb (94.3 kg)   SpO2 97%   BMI 34.61 kg/m   Past Medical History:  Allergies, Anemia, Anxiety, Vertigo, Depression, Diverticulosis, GERD, Esophageal stricture, Gallstones, Hiatal hernia, Hyperparathyroidism s/p Lt parathyroidectomy, HTN, HLD, Hypothyroidism, Vit D deficiency  Brief Summary:  Gail Fuentes is a 72 y.o. female former smoker with cough.  Her parents had COPD and emphysema and were both heavy smokers.  Her brother has Wegener's.  She has remote history of smoking, but significant second hand tobacco exposure.  She gets asthmatic bronchitis at least once a year, and has this happen for past several years.  She typical gets episodes around October or November.  She is prone to getting allergies, and this is usually how her symptoms start.  She gets sinus congestion, post nasal drip, and cough.  This then settles into her chest.  She gets a rattle and whistle.  In between episodes she does okay.  She doesn't feel like her breathing limits her activity level.  She has not had recent chest xray.  Doesn't remember having PFT or allergy testing.  She is from [place].  Worked in an office, and retired several years ago.  Her son has several cats.  No history of pneumonia.  Her mother had TB years ago, but she wasn't living at home then. Physical Exam:   Appearance - well kempt   ENMT - clear nasal mucosa, midline nasal  septum, no oral exudates, no LAN, trachea midline  Respiratory - normal chest wall, normal respiratory effort, no accessory muscle use, no  wheeze/rales  CV - s1s2 regular rate and rhythm, no murmurs, no peripheral edema, radial pulses symmetric  GI - soft, non tender, no masses  Lymph - no adenopathy noted in neck and axillary areas  MSK - normal gait  Ext - no cyanosis, clubbing, or joint inflammation noted  Skin - no rashes, lesions, or ulcers  Neuro - normal strength, oriented x 3  Psych - normal mood and affect  Discussion:  She has intermittent episodes of asthmatic bronchitis that are triggered by rhino-sinusitis.  There seems to be a seasonal component to her symptoms.  She does have prior history of smoking and significant second hand tobacco exposure.  She also has several first degree relatives with lung diseases.  Assessment/Plan:   Chronic cough. - okay to continue prn albuterol, allegra, tessalon - will arrange for RAST with IgE, CBC with diff, CMET - will arrange for PFT and CXR - based on test results will determine if she needs further diagnostic evaluation, or initiation of maintenance therapies    Patient Instructions  Will arrange for following lab tests: RAST with IgE, CBC with differential, CMET  Will schedule chest xray and pulmonary function test  Follow up in 4 weeks with Dr. Craige Cotta or Nurse Practitioner  Time spent 43 minutes  Coralyn Helling, MD [facility] Pager: [phone] 03/06/2018, 12:27 PM  Flow Sheet     Pulmonary tests:    Cardiac tests:  Echo 06/14/16 >> mild LVH, EF 60 to  65%, grade 1 DD   Review of Systems:  Constitutional: Positive for fever. Negative for unexpected weight change.  HENT: Positive for congestion, postnasal drip, sinus pressure, sneezing, sore throat and trouble swallowing. Negative for dental problem, ear pain, nosebleeds and rhinorrhea.   Eyes: Negative for redness and itching.  Respiratory: Positive for cough, chest tightness, shortness of breath and wheezing.   Cardiovascular: Negative for palpitations and leg swelling.   Gastrointestinal: Positive for nausea. Negative for vomiting.  Genitourinary: Negative for dysuria.  Musculoskeletal: Negative for joint swelling.  Skin: Negative for rash.  Allergic/Immunologic: Positive for environmental allergies. Negative for food allergies and immunocompromised state.  Neurological: Positive for headaches.  Hematological: Bruises/bleeds easily.  Psychiatric/Behavioral: Negative for dysphoric mood. The patient is nervous/anxious.    Medications:   Allergies as of 03/06/2018      Reactions   Augmentin [amoxicillin-pot Clavulanate] Rash      Medication List        Accurate as of 03/06/18 12:27 PM. Always use your most recent med list.          ALPRAZolam 0.5 MG tablet Commonly known as:  XANAX Take 0.5 mg by mouth 3 (three) times daily as needed for anxiety.   benzonatate 100 MG capsule Commonly known as:  TESSALON Take 1 capsule (100 mg total) by mouth 3 (three) times daily as needed.   cyanocobalamin 100 MCG tablet Take 100 mcg by mouth daily.   doxycycline 100 MG tablet Commonly known as:  VIBRA-TABS Take 1 tablet (100 mg total) by mouth 2 (two) times daily.   fexofenadine 180 MG tablet Commonly known as:  ALLEGRA Take 180 mg by mouth as needed for allergies. Reported on 07/18/2015   FLUoxetine 40 MG capsule Commonly known as:  PROZAC Take 40 mg by mouth every morning.   hydrochlorothiazide 12.5 MG capsule Commonly known as:  MICROZIDE Take 1 capsule (12.5 mg total) by mouth daily.   ipratropium 0.03 % nasal spray Commonly known as:  ATROVENT Place 2 sprays in each nostril up to 4x a day for post nasal drip   losartan 50 MG tablet Commonly known as:  COZAAR Take 1 tablet (50 mg total) by mouth daily.   omeprazole 20 MG capsule Commonly known as:  PRILOSEC Take 1 capsule (20 mg total) by mouth daily.   SYNTHROID 100 MCG tablet Generic drug:  levothyroxine TAKE ONE TABLET DAILY AND TAKE ONE-HALF (1/2) TABLET ON SUNDAY,   Vitamin  D3 2000 units Tabs Take 2 tablets by mouth daily.       Past Surgical History:  She  has a past surgical history that includes Dental surgery (1962, 2012); Cesarean section (1977); Colonoscopy (12/2003); Total abdominal hysterectomy w/ bilateral salpingoophorectomy (2005); Cholecystectomy (2004); and Parathyroidectomy (Left, 02/13/2013).  Family History:  Her family history includes Arthritis in her brother; COPD in her father and mother; Cancer in her brother; Colon polyps in her mother; Coronary artery disease in her father; Depression in her mother; Diabetes in her mother; Dysrhythmia in her mother; Heart attack (age of onset: 57) in her father; Heart disease in her brother; Hypertension in her brother; Kidney disease in her mother; Other in her brother; Prostate cancer in her brother and brother; Stroke in her maternal aunt and maternal grandmother; Stroke (age of onset: 31) in her maternal grandfather.  Social History:  She  reports that she quit smoking about 44 years ago. She has never used smokeless tobacco. She reports that she drinks about 14.0 standard drinks of  alcohol per week. She reports that she does not use drugs.

## 2018-03-06 NOTE — Progress Notes (Signed)
   Subjective:    Patient ID: Gail Fuentes, female    DOB: 1945-12-28, 72 y.o.   MRN: [id number]  HPI    Review of Systems  Constitutional: Positive for fever. Negative for unexpected weight change.  HENT: Positive for congestion, postnasal drip, sinus pressure, sneezing, sore throat and trouble swallowing. Negative for dental problem, ear pain, nosebleeds and rhinorrhea.   Eyes: Negative for redness and itching.  Respiratory: Positive for cough, chest tightness, shortness of breath and wheezing.   Cardiovascular: Negative for palpitations and leg swelling.  Gastrointestinal: Positive for nausea. Negative for vomiting.  Genitourinary: Negative for dysuria.  Musculoskeletal: Negative for joint swelling.  Skin: Negative for rash.  Allergic/Immunologic: Positive for environmental allergies. Negative for food allergies and immunocompromised state.  Neurological: Positive for headaches.  Hematological: Bruises/bleeds easily.  Psychiatric/Behavioral: Negative for dysphoric mood. The patient is nervous/anxious.        Objective:   Physical Exam        Assessment & Plan:

## 2018-03-07 ENCOUNTER — Encounter

## 2018-03-07 LAB — COMPREHENSIVE METABOLIC PANEL
A/G RATIO: 1.7 (ref 1.2–2.2)
ALK PHOS: 68 IU/L (ref 39–117)
ALT: 25 IU/L (ref 0–32)
AST: 27 IU/L (ref 0–40)
Albumin: 4 g/dL (ref 3.5–4.8)
BILIRUBIN TOTAL: 0.3 mg/dL (ref 0.0–1.2)
BUN/Creatinine Ratio: 12 (ref 12–28)
BUN: 10 mg/dL (ref 8–27)
CHLORIDE: 101 mmol/L (ref 96–106)
CO2: 25 mmol/L (ref 20–29)
Calcium: 9.7 mg/dL (ref 8.7–10.3)
Creatinine, Ser: 0.82 mg/dL (ref 0.57–1.00)
GFR calc non Af Amer: 72 mL/min/{1.73_m2} (ref >59)
GFR, EST AFRICAN AMERICAN: 83 mL/min/{1.73_m2} (ref >59)
GLUCOSE: 93 mg/dL (ref 65–99)
Globulin, Total: 2.3 g/dL (ref 1.5–4.5)
POTASSIUM: 4.7 mmol/L (ref 3.5–5.2)
Sodium: 138 mmol/L (ref 134–144)
TOTAL PROTEIN: 6.3 g/dL (ref 6.0–8.5)

## 2018-03-07 LAB — CBC WITH DIFFERENTIAL
BASOS ABS: 0 10*3/uL (ref 0.0–0.2)
Basos: 1 %
EOS (ABSOLUTE): 0.3 10*3/uL (ref 0.0–0.4)
Eos: 5 %
HEMOGLOBIN: 13.4 g/dL (ref 11.1–15.9)
Hematocrit: 39.4 % (ref 34.0–46.6)
IMMATURE GRANULOCYTES: 1 %
Immature Grans (Abs): 0.1 10*3/uL (ref 0.0–0.1)
LYMPHS ABS: 1.4 10*3/uL (ref 0.7–3.1)
Lymphs: 22 %
MCH: 30.5 pg (ref 26.6–33.0)
MCHC: 34 g/dL (ref 31.5–35.7)
MCV: 90 fL (ref 79–97)
MONOCYTES: 9 %
Monocytes Absolute: 0.6 10*3/uL (ref 0.1–0.9)
Neutrophils Absolute: 4.1 10*3/uL (ref 1.4–7.0)
Neutrophils: 62 %
RBC: 4.39 x10E6/uL (ref 3.77–5.28)
RDW: 11.6 % — AB (ref 12.3–15.4)
WBC: 6.5 10*3/uL (ref 3.4–10.8)

## 2018-03-07 LAB — SPECIMEN STATUS REPORT

## 2018-03-09 LAB — ALLERGENS W/TOTAL IGE AREA 2
Alternaria Alternata IgE: <0.1 kU/L
Cat Dander IgE: <0.1 kU/L
Cladosporium Herbarum IgE: <0.1 kU/L
Common Silver Birch IgE: <0.1 kU/L
D001-IGE D PTERONYSSINUS: 0.12 kU/L — AB
Dog Dander IgE: <0.1 kU/L
Elm, American IgE: <0.1 kU/L
IgE (Immunoglobulin E), Serum: 11 IU/mL (ref 6–495)
Maple/Box Elder IgE: <0.1 kU/L
Mouse Urine IgE: <0.1 kU/L
Pecan, Hickory IgE: <0.1 kU/L
Penicillium Chrysogen IgE: <0.1 kU/L
Pigweed, Rough IgE: <0.1 kU/L
Sheep Sorrel IgE Qn: <0.1 kU/L
White Mulberry IgE: <0.1 kU/L

## 2018-03-09 LAB — SPECIMEN STATUS REPORT

## 2018-03-11 ENCOUNTER — Telehealth: Payer: Self-pay | Admitting: Pulmonary Disease

## 2018-03-11 NOTE — Telephone Encounter (Signed)
CMP Latest Ref Rng & Units 03/06/2018 11/15/2017 02/13/2017  Glucose 65 - 99 mg/dL 93 79 142(L)  BUN 8 - 27 mg/dL 10 10 6   Creatinine 0.57 - 1.00 mg/dL 9.53 2.02 3.34  Sodium 134 - 144 mmol/L 138 139 139  Potassium 3.5 - 5.2 mmol/L 4.7 4.4 3.7  Chloride 96 - 106 mmol/L 101 103 101  CO2 20 - 29 mmol/L 25 28 31   Calcium 8.7 - 10.3 mg/dL 9.7 9.3 9.8  Total Protein 6.0 - 8.5 g/dL 6.3 - 7.3  Total Bilirubin 0.0 - 1.2 mg/dL 0.3 - 0.5  Alkaline Phos 39 - 117 IU/L 68 - 75  AST 0 - 40 IU/L 27 - 27  ALT 0 - 32 IU/L 25 - 27    CBC    Component Value Date/Time   WBC 6.5 03/06/2018 0000   WBC 5.5 12/02/2017 0924   RBC 4.39 03/06/2018 0000   RBC 4.41 12/02/2017 0924   HGB 13.4 03/06/2018 0000   HCT 39.4 03/06/2018 0000   PLT 317.0 12/02/2017 0924   MCV 90 03/06/2018 0000   MCH 30.5 03/06/2018 0000   MCH 30.4 02/10/2013 0940   MCHC 34.0 03/06/2018 0000   MCHC 33.6 12/02/2017 0924   RDW 11.6 (L) 03/06/2018 0000   LYMPHSABS 1.4 03/06/2018 0000   MONOABS 0.5 02/13/2017 1118   EOSABS 0.3 03/06/2018 0000   BASOSABS 0.0 03/06/2018 0000    Dg Chest 2 View  Result Date: 03/06/2018 CLINICAL DATA:  Chronic cough, congestion over the last week. EXAM: CHEST - 2 VIEW COMPARISON:  Chest x-ray of 11/09/2015 FINDINGS: No active infiltrate or effusion is seen. Mediastinal and hilar contours are unremarkable. Mild cardiomegaly is stable. No acute bony abnormality is seen IMPRESSION: No active cardiopulmonary disease. Electronically Signed   By: Dwyane Dee M.D.   On: 03/06/2018 16:12    RAST 03/06/18 >> dust mites, IgE 11   Please let her know her lab tests showed mild sensitivity to dust mites.  All other labs normal, and chest xray normal.  Will discuss in more detail at next visit.

## 2018-03-13 NOTE — Telephone Encounter (Signed)
Attempted to call patient today regarding results. I did not receive an answer at time of call. I have left a voicemail message for pt to return call. X1  

## 2018-03-14 ENCOUNTER — Telehealth: Payer: Self-pay | Admitting: Pulmonary Disease

## 2018-03-14 NOTE — Telephone Encounter (Signed)
Called patient unable to reach left message to give us a call back.

## 2018-03-17 NOTE — Telephone Encounter (Signed)
See telephone encounter 03/11/18

## 2018-03-17 NOTE — Telephone Encounter (Signed)
Attempted to contact pt. I did not receive an answer. I have left a message for pt to return our call.  

## 2018-03-17 NOTE — Telephone Encounter (Signed)
Called and spoke with patient she is aware of results and verbalized understanding. Nothing further needed.  

## 2018-03-17 NOTE — Telephone Encounter (Signed)
Patient returned phone call. °

## 2018-03-25 ENCOUNTER — Ambulatory Visit (INDEPENDENT_AMBULATORY_CARE_PROVIDER_SITE_OTHER): Payer: Medicare Other | Admitting: Family

## 2018-03-25 ENCOUNTER — Encounter: Payer: Self-pay | Admitting: Family

## 2018-03-25 VITALS — BP 140/78 | HR 90 | Temp 98.7°F | Ht 65.0 in | Wt 206.0 lb

## 2018-03-25 DIAGNOSIS — J020 Streptococcal pharyngitis: Secondary | ICD-10-CM | POA: Diagnosis not present

## 2018-03-25 MED ORDER — AZITHROMYCIN 250 MG PO TABS: ORAL_TABLET | ORAL | 0 refills | Status: DC

## 2018-03-25 NOTE — Patient Instructions (Signed)
Strep Throat Strep throat is a bacterial infection of the throat. Your health care provider may call the infection tonsillitis or pharyngitis, depending on whether there is swelling in the tonsils or at the back of the throat. Strep throat is most common during the cold months of the year in children who are 5-71 years of age, but it can happen during any season in people of any age. This infection is spread from person to person (contagious) through coughing, sneezing, or close contact. What are the causes? Strep throat is caused by the bacteria called Streptococcus pyogenes. What increases the risk? This condition is more likely to develop in:  People who spend time in crowded places where the infection can spread easily.  People who have close contact with someone who has strep throat.  What are the signs or symptoms? Symptoms of this condition include:  Fever or chills.  Redness, swelling, or pain in the tonsils or throat.  Pain or difficulty when swallowing.  White or yellow spots on the tonsils or throat.  Swollen, tender glands in the neck or under the jaw.  Red rash all over the body (rare).  How is this diagnosed? This condition is diagnosed by performing a rapid strep test or by taking a swab of your throat (throat culture test). Results from a rapid strep test are usually ready in a few minutes, but throat culture test results are available after one or two days. How is this treated? This condition is treated with antibiotic medicine. Follow these instructions at home: Medicines  Take over-the-counter and prescription medicines only as told by your health care provider.  Take your antibiotic as told by your health care provider. Do not stop taking the antibiotic even if you start to feel better.  Have family members who also have a sore throat or fever tested for strep throat. They may need antibiotics if they have the strep infection. Eating and drinking  Do not  share food, drinking cups, or personal items that could cause the infection to spread to other people.  If swallowing is difficult, try eating soft foods until your sore throat feels better.  Drink enough fluid to keep your urine clear or pale yellow. General instructions  Gargle with a salt-water mixture 3-4 times per day or as needed. To make a salt-water mixture, completely dissolve -1 tsp of salt in 1 cup of warm water.  Make sure that all household members wash their hands well.  Get plenty of rest.  Stay home from school or work until you have been taking antibiotics for 24 hours.  Keep all follow-up visits as told by your health care provider. This is important. Contact a health care provider if:  The glands in your neck continue to get bigger.  You develop a rash, cough, or earache.  You cough up a thick liquid that is green, yellow-brown, or bloody.  You have pain or discomfort that does not get better with medicine.  Your problems seem to be getting worse rather than better.  You have a fever. Get help right away if:  You have new symptoms, such as vomiting, severe headache, stiff or painful neck, chest pain, or shortness of breath.  You have severe throat pain, drooling, or changes in your voice.  You have swelling of the neck, or the skin on the neck becomes red and tender.  You have signs of dehydration, such as fatigue, dry mouth, and decreased urination.  You become increasingly sleepy, or   you cannot wake up completely.  Your joints become red or painful. This information is not intended to replace advice given to you by your health care provider. Make sure you discuss any questions you have with your health care provider. Document Released: 04/20/2000 Document Revised: 12/21/2015 Document Reviewed: 08/16/2014 Elsevier Interactive Patient Education  2018 [employer]  

## 2018-03-25 NOTE — Progress Notes (Signed)
Gail Fuentes is a 72 y.o. female with the following history as recorded in EpicCare:  Patient Active Problem List   Diagnosis Date Noted  . History of hyperparathyroidism 08/24/2014  . Obesity (BMI 30-39.9) 10/08/2013  . HTN (hypertension) 07/13/2013  . Elevated blood pressure reading without diagnosis of hypertension 06/29/2013  . Osteoporosis 11/14/2012  . Acute iritis of both eyes 10/17/2012  . Thyroid nodule 10/17/2012  . Cardiomegaly 02/20/2012  . DISTURBANCES OF SENSATION OF SMELL AND TASTE 04/19/2010  . Vitamin D deficiency 12/24/2008  . ANXIETY 12/24/2008  . DEPRESSION 12/24/2008  . BENIGN PAROXYSMAL POSITIONAL VERTIGO 12/24/2008  . HYPERGLYCEMIA 12/24/2008  . Nonspecific abnormal electrocardiogram (ECG) (EKG) 12/24/2008  . Hyperlipidemia 07/23/2007  . Hypothyroidism 07/22/2007  . GERD 07/22/2007  . DIVERTICULITIS, HX OF 07/22/2007    Current Outpatient Medications  Medication Sig Dispense Refill  . ALPRAZolam (XANAX) 0.5 MG tablet Take 0.5 mg by mouth 3 (three) times daily as needed for anxiety.     . benzonatate (TESSALON) 100 MG capsule Take 1 capsule (100 mg total) by mouth 3 (three) times daily as needed. 20 capsule 0  . Cholecalciferol (VITAMIN D3) 2000 UNITS TABS Take 2 tablets by mouth daily.     . cyanocobalamin 100 MCG tablet Take 100 mcg by mouth daily.    [name] doxycycline (VIBRA-TABS) 100 MG tablet Take 1 tablet (100 mg total) by mouth 2 (two) times daily. 20 tablet 0  . fexofenadine (ALLEGRA) 180 MG tablet Take 180 mg by mouth as needed for allergies. Reported on 07/18/2015    . FLUoxetine (PROZAC) 40 MG capsule Take 40 mg by mouth every morning.     . hydrochlorothiazide (MICROZIDE) 12.5 MG capsule Take 1 capsule (12.5 mg total) by mouth daily. 90 capsule 2  . ipratropium (ATROVENT) 0.03 % nasal spray Place 2 sprays in each nostril up to 4x a day for post nasal drip 30 mL 6  . losartan (COZAAR) 50 MG tablet Take 1 tablet (50 mg total) by mouth daily. 90  tablet 3  . omeprazole (PRILOSEC) 20 MG capsule Take 1 capsule (20 mg total) by mouth daily. 90 capsule 3  . SYNTHROID 100 MCG tablet TAKE ONE TABLET DAILY AND TAKE ONE-HALF (1/2) TABLET ON SUNDAY, 90 tablet 3  . azithromycin (ZITHROMAX) 250 MG tablet 2 tabs po qd x 1 day; 1 tablet per day x 4 days; 6 tablet 0   No current facility-administered medications for this visit.     Allergies: Augmentin [amoxicillin-pot clavulanate]  Past Medical History:  Diagnosis Date  . Abnormal EKG    per patient- states evaluated by Dr Daleen Squibb in the past  . Allergy    sneezing  late Fall  . Anemia 01/23/12   H/H 11.7/35  . Anxiety state, unspecified   . Arthritis   . Benign paroxysmal positional vertigo   . Depression   . Depressive disorder, not elsewhere classified   . Diverticulosis   . Esophageal reflux   . Esophageal stricture   . Gallstones   . Heart murmur   . Hiatal hernia   . History of hyperparathyroidism   . History of UTI   . HTN (hypertension)   . Hyperlipidemia   . Other abnormal glucose   . Personal history of other diseases of digestive system    12-15 since 1990, no recurrence 2006  . PONV (postoperative nausea and vomiting)   . Unspecified hypothyroidism   . Vitamin D deficiency     Past Surgical History:  Procedure Laterality Date  . CESAREAN SECTION  1977  . CHOLECYSTECTOMY  2004  . COLONOSCOPY  12/2003   Tics  . DENTAL SURGERY  1962, 2012   Age 25-Abscessed Teeth Extracted   . PARATHYROIDECTOMY Left 02/13/2013   Procedure: LEFT INFERIOR PARATHYROIDECTOMY with frozen section ;  Surgeon: Velora Heckler, MD;  Location: [facility];  Service: General;  Laterality: Left;  . TOTAL ABDOMINAL HYSTERECTOMY W/ BILATERAL SALPINGOOPHORECTOMY  2005   Ovarian Cyst    Family History  Problem Relation Age of Onset  . Coronary artery disease Father   . Heart attack Father 37  . COPD Father   . Diabetes Mother   . Dysrhythmia Mother   . Depression Mother   . COPD Mother   . Colon  polyps Mother   . Kidney disease Mother   . Prostate cancer Brother   . Other Brother        Wegener's in sinus; [facility]  . Heart disease Brother   . Arthritis Brother   . Hypertension Brother   . Prostate cancer Brother   . Cancer Brother        Soft tissue in the face/temple  . Stroke Maternal Aunt        >65  . Stroke Maternal Grandmother        >65  . Stroke Maternal Grandfather 35    Social History   Tobacco Use  . Smoking status: Former Smoker    Last attempt to quit: 05/07/1973    Years since quitting: 44.9  . Smokeless tobacco: Never Used  . Tobacco comment: Smoked 1968-1975 up to 2 ppd  Substance Use Topics  . Alcohol use: Yes    Alcohol/week: 14.0 standard drinks    Types: 14 Standard drinks or equivalent per week    Comment: 2 nightly wines    Subjective:  Patient notes she woke up this morning with sore throat/ tonsils swollen; + low-grade fever; + increased fatigue over the past week; "just haven't felt well;" concerned for possible strep based on appearance of tonsils; has 4 grandchildren- does not think any of them have had strep;    Objective:  Vitals:   03/25/18 1419  BP: 140/78  Pulse: 90  Temp: 98.7 F (37.1 C)  TempSrc: Oral  SpO2: 98%  Weight: 206 lb 0.6 oz (93.5 kg)  Height: 5\' 5"  (1.651 m)    General: Well developed, well nourished, in no acute distress  Skin : Warm and dry.  Head: Normocephalic and atraumatic  Eyes: Sclera and conjunctiva clear; pupils round and reactive to light; extraocular movements intact  Ears: External normal; canals clear; tympanic membranes normal  Oropharynx: Pink, supple. + tonsilar exudates; + erythema;  Neck: Supple without thyromegaly, adenopathy  Lungs: Respirations unlabored; clear to auscultation bilaterally without wheeze, rales, rhonchi  CVS exam: normal rate and regular rhythm.  Neurologic: Alert and oriented; speech intact; face symmetrical; moves all extremities well; CNII-XII intact without focal  deficit   Assessment:  1. Strep throat     Plan:  Rapid strep in office is negative; Rx for Z-pak #1 take as directed; change toothbrush after 24 hours and again when finishes antibiotics; she will talk to her children/ grandchildren to make sure they are aware of infection; increase fluids, rest; follow-up worse, no better.  Continue to see PCP for chronic care needs.    No follow-ups on file.  No orders of the defined types were placed in this encounter.   Requested Prescriptions  Signed Prescriptions Disp Refills  . azithromycin (ZITHROMAX) 250 MG tablet 6 tablet 0    Sig: 2 tabs po qd x 1 day; 1 tablet per day x 4 days;

## 2018-04-11 ENCOUNTER — Encounter: Payer: Self-pay | Admitting: Pulmonary Disease

## 2018-04-11 ENCOUNTER — Ambulatory Visit (INDEPENDENT_AMBULATORY_CARE_PROVIDER_SITE_OTHER): Payer: Medicare Other | Admitting: Pulmonary Disease

## 2018-04-11 VITALS — BP 112/80 | HR 59 | Ht 65.0 in | Wt 204.6 lb

## 2018-04-11 DIAGNOSIS — R0683 Snoring: Secondary | ICD-10-CM

## 2018-04-11 DIAGNOSIS — R05 Cough: Secondary | ICD-10-CM

## 2018-04-11 DIAGNOSIS — R058 Other specified cough: Secondary | ICD-10-CM

## 2018-04-11 DIAGNOSIS — R053 Chronic cough: Secondary | ICD-10-CM

## 2018-04-11 MED ORDER — FLUTICASONE PROPIONATE 50 MCG/ACT NA SUSP: 1.0000 | Freq: Every day | NASAL | 2 refills | Status: DC

## 2018-04-11 NOTE — Progress Notes (Signed)
PFT was attempted today. Patient gave a good effort on Spirometry and DLCO but they could not be completed due to the patient being unable to perform the proper technique of the tests. Dr. Craige Cotta was made aware of this. I offered to have a coworker attempt to try to test but Dr. Craige Cotta declined.

## 2018-04-11 NOTE — Progress Notes (Signed)
[facility]  Chief Complaint  Patient presents with  . Follow-up    Pt had PFT prior to ov today. Pt has dry cough, that is not as bad since last ov.    Constitutional:  BP 112/80 (BP Location: Left Arm, Cuff Size: Normal)   Pulse (!) 59   Ht 5\' 5"  (1.651 m)   Wt 204 lb 9.6 oz (92.8 kg)   SpO2 100%   BMI 34.05 kg/m   Past Medical History:  Allergies, Anemia, Anxiety, Vertigo, Depression, Diverticulosis, GERD, Esophageal stricture, Gallstones, Hiatal hernia, Hyperparathyroidism s/p Lt parathyroidectomy, HTN, HLD, Hypothyroidism, Vit D deficiency  Brief Summary:  Gail Fuentes is a 72 y.o. female former smoker with cough.  Her CXR from 03/06/18 was normal (reviewed by me).  She had lab work which showed mildly elevated eosinophil level.  RAST was mildly positive for dust mites.  She was treated with a Zpak recently.  Cough has been better.  Has frequent sinus congestion and post nasal drip.  Has to clear her throat.  No having sputum, wheeze, chest pain.  She snores at night and is a restless sleeper.  She feels like she holds her breath at times during the day.  She is tired during the day.  Her husband says her breathing gets quiet when she is asleep.  Spirometry today was normal.  Physical Exam:   Appearance - well kempt   ENMT - no sinus tenderness, no nasal discharge, no oral exudate, narrow nasal angles, pale nasal mucosa  Neck - no masses, trachea midline, no thyromegaly, no elevation in JVP  Respiratory - normal appearance of chest wall, normal respiratory effort w/o accessory muscle use, no dullness on percussion, no tactile fremitus, no wheezing or rales  CV - s1s2 regular rate and rhythm, no murmurs, no peripheral edema, no varicosities, radial pulses symmetric  GI - soft, non tender, no masses, no hepatosplenomegaly  Lymph - no adenopathy noted in neck and axillary areas  MSK - normal gait  Ext - no cyanosis,  clubbing, or joint inflammation noted  Skin - no rashes, lesions, or ulcers  Neuro - normal strength, oriented x 3  Psych - normal mood and affect  Assessment/Plan:   Upper airway cough syndrome. - add flonase - continue allegra - avoid throat clearing - prn albuterol  Snoring with daytime sleepiness. - concerned she could have sleep apnea - will arrange for home sleep study   Patient Instructions  Flonase 1 spray in each nostril daily  Will arrange for home sleep study  Will call to schedule follow up after sleep study reviewed  Time spent 29 minutes  Coralyn Helling, MD [facility] Pager: [phone] 04/11/2018, 1:58 PM  Flow Sheet     Pulmonary tests:  CBC 03/06/18 >> Eosinophils 300  RAST 03/06/18 >> dust mites, IgE 11 Spirometry 04/11/18 >> FEV1 2.1 (90%), FEV1% 79  Cardiac tests:  Echo 06/14/16 >> mild LVH, EF 60 to 65%, grade 1 DD  Medications:   Allergies as of 04/11/2018      Reactions   Augmentin [amoxicillin-pot Clavulanate] Rash      Medication List        Accurate as of 04/11/18  1:58 PM. Always use your most recent med list.          ALPRAZolam 0.5 MG tablet Commonly known as:  XANAX Take 0.5 mg by mouth 3 (three) times daily as needed for anxiety.   cyanocobalamin 100  MCG tablet Take 100 mcg by mouth daily.   fexofenadine 180 MG tablet Commonly known as:  ALLEGRA Take 180 mg by mouth as needed for allergies. Reported on 07/18/2015   FLUoxetine 40 MG capsule Commonly known as:  PROZAC Take 40 mg by mouth every morning.   fluticasone 50 MCG/ACT nasal spray Commonly known as:  FLONASE Place 1 spray into both nostrils daily.   hydrochlorothiazide 12.5 MG capsule Commonly known as:  MICROZIDE Take 1 capsule (12.5 mg total) by mouth daily.   losartan 50 MG tablet Commonly known as:  COZAAR Take 1 tablet (50 mg total) by mouth daily.   omeprazole 20 MG capsule Commonly known as:  PRILOSEC Take 1 capsule (20  mg total) by mouth daily.   SYNTHROID 100 MCG tablet Generic drug:  levothyroxine TAKE ONE TABLET DAILY AND TAKE ONE-HALF (1/2) TABLET ON SUNDAY,   Vitamin D3 50 MCG (2000 UT) Tabs Take 2 tablets by mouth daily.       Past Surgical History:  She  has a past surgical history that includes Dental surgery (1962, 2012); Cesarean section (1977); Colonoscopy (12/2003); Total abdominal hysterectomy w/ bilateral salpingoophorectomy (2005); Cholecystectomy (2004); and Parathyroidectomy (Left, 02/13/2013).  Family History:  Her family history includes Arthritis in her brother; COPD in her father and mother; Cancer in her brother; Colon polyps in her mother; Coronary artery disease in her father; Depression in her mother; Diabetes in her mother; Dysrhythmia in her mother; Heart attack (age of onset: 50) in her father; Heart disease in her brother; Hypertension in her brother; Kidney disease in her mother; Other in her brother; Prostate cancer in her brother and brother; Stroke in her maternal aunt and maternal grandmother; Stroke (age of onset: 4) in her maternal grandfather.  Social History:  She  reports that she quit smoking about 44 years ago. She has never used smokeless tobacco. She reports that she drinks about 14.0 standard drinks of alcohol per week. She reports that she does not use drugs.

## 2018-04-11 NOTE — Patient Instructions (Signed)
Flonase 1 spray in each nostril daily  Will arrange for home sleep study  Will call to schedule follow up after sleep study reviewed

## 2018-05-15 ENCOUNTER — Ambulatory Visit: Payer: Self-pay | Admitting: Cardiovascular Disease

## 2018-05-15 DIAGNOSIS — G4733 Obstructive sleep apnea (adult) (pediatric): Secondary | ICD-10-CM

## 2018-05-16 NOTE — Progress Notes (Addendum)
[facility] [address] [place], [place] [address] [phone]  Date:  05/19/2018   Name:  Gail Fuentes   DOB:  [birth year]   MRN:  [id number]  PCP:  Pearline Cables, MD    Chief Complaint: Emotional Issues (would like referral to therapist)   History of Present Illness:  Gail Fuentes is a 73 y.o. very pleasant female patient who presents with the following:  Patient with history of obesity, hypertension, osteoporosis, hyperlipidemia, hypothyroidism, hyperparathyroidism status post parathyroidectomy in 2014.  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  When I last saw her in July she was taking Prozac and felt like it was working well for her She uses Xanax on rare occasion  She is a patient of Dr. Elvera Lennox for endocrinology care  She feels like she is cycling through depression and anxiety The colder weather tends to make her feel more sad, and she has recurrent thoughts about traumatic events that have occurred in the past She really has to work hard over the holidays and feels like she is responsible for so much.  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  She loves her family and enjoys giving of her time to them, but she feels that maybe she needs to take more time for herself.  She feels that she needs to get better at saying no She saw a therapist years ago who is working for [employer].  She would like to go back and see her again perhaps   She is planning cataract surgery in the next month or so Her husband did retired   Her son did Wellsite geologist not long ago, and she now has 2 step grands as well.  He has 2 kids of his own as well.   She did a sleep study per Dr. Craige Cotta, pulmonology last week  Patient Active Problem List   Diagnosis Date Noted  . History of hyperparathyroidism 08/24/2014  . Obesity (BMI 30-39.9)  10/08/2013  . HTN (hypertension) 07/13/2013  . Elevated blood pressure reading without diagnosis of hypertension 06/29/2013  . Osteoporosis 11/14/2012  . Acute iritis of both eyes 10/17/2012  . Thyroid nodule 10/17/2012  . Cardiomegaly 02/20/2012  . DISTURBANCES OF SENSATION OF SMELL AND TASTE 04/19/2010  . Vitamin D deficiency 12/24/2008  . ANXIETY 12/24/2008  . DEPRESSION 12/24/2008  . BENIGN PAROXYSMAL POSITIONAL VERTIGO 12/24/2008  . HYPERGLYCEMIA 12/24/2008  . Nonspecific abnormal electrocardiogram (ECG) (EKG) 12/24/2008  . Hyperlipidemia 07/23/2007  . Hypothyroidism 07/22/2007  . GERD 07/22/2007  . DIVERTICULITIS, HX OF 07/22/2007    Past Medical History:  Diagnosis Date  . Abnormal EKG    per patient- states evaluated by Dr Daleen Squibb in the past  . Allergy    sneezing  late Fall  . Anemia 01/23/12   H/H 11.7/35  . Anxiety state, unspecified   . Arthritis   . Benign paroxysmal positional vertigo   . Depression   . Depressive disorder, not elsewhere classified   . Diverticulosis   . Esophageal reflux   . Esophageal stricture   . Gallstones   . Heart murmur   . Hiatal hernia   . History of hyperparathyroidism   . History of UTI   . HTN (hypertension)   . Hyperlipidemia   . Other abnormal glucose   . Personal history of other diseases of digestive  system    12-15 since 1990, no recurrence 2006  . PONV (postoperative nausea and vomiting)   . Unspecified hypothyroidism   . Vitamin D deficiency     Past Surgical History:  Procedure Laterality Date  . CESAREAN SECTION  1977  . CHOLECYSTECTOMY  2004  . COLONOSCOPY  12/2003   Tics  . DENTAL SURGERY  1962, 2012   Age 88-Abscessed Teeth Extracted   . PARATHYROIDECTOMY Left 02/13/2013   Procedure: LEFT INFERIOR PARATHYROIDECTOMY with frozen section ;  Surgeon: Velora Heckler, MD;  Location: [facility];  Service: General;  Laterality: Left;  . TOTAL ABDOMINAL HYSTERECTOMY W/ BILATERAL SALPINGOOPHORECTOMY  2005   Ovarian  Cyst    Social History   Tobacco Use  . Smoking status: Former Smoker    Last attempt to quit: 05/07/1973    Years since quitting: 45.0  . Smokeless tobacco: Never Used  . Tobacco comment: Smoked 1968-1975 up to 2 ppd  Substance Use Topics  . Alcohol use: Yes    Alcohol/week: 14.0 standard drinks    Types: 14 Standard drinks or equivalent per week    Comment: 2 nightly wines  . Drug use: No    Family History  Problem Relation Age of Onset  . Coronary artery disease Father   . Heart attack Father 75  . COPD Father   . Diabetes Mother   . Dysrhythmia Mother   . Depression Mother   . COPD Mother   . Colon polyps Mother   . Kidney disease Mother   . Prostate cancer Brother   . Other Brother        Wegener's in sinus; [facility]  . Heart disease Brother   . Arthritis Brother   . Hypertension Brother   . Prostate cancer Brother   . Cancer Brother        Soft tissue in the face/temple  . Stroke Maternal Aunt        >65  . Stroke Maternal Grandmother        >65  . Stroke Maternal Grandfather 47    Allergies  Allergen Reactions  . Augmentin [Amoxicillin-Pot Clavulanate] Rash    Medication list has been reviewed and updated.  Current Outpatient Medications on File Prior to Visit  Medication Sig Dispense Refill  . ALPRAZolam (XANAX) 0.5 MG tablet Take 0.5 mg by mouth 3 (three) times daily as needed for anxiety.     . Cholecalciferol (VITAMIN D3) 2000 UNITS TABS Take 2 tablets by mouth daily.     . cyanocobalamin 100 MCG tablet Take 100 mcg by mouth daily.    . fexofenadine (ALLEGRA) 180 MG tablet Take 180 mg by mouth as needed for allergies. Reported on 07/18/2015    . FLUoxetine (PROZAC) 40 MG capsule Take 40 mg by mouth every morning.     . fluticasone (FLONASE) 50 MCG/ACT nasal spray Place 1 spray into both nostrils daily. 16 g 2  . hydrochlorothiazide (MICROZIDE) 12.5 MG capsule Take 1 capsule (12.5 mg total) by mouth daily. 90 capsule 2  . losartan (COZAAR) 50 MG  tablet Take 1 tablet (50 mg total) by mouth daily. 90 tablet 3  . omeprazole (PRILOSEC) 20 MG capsule Take 1 capsule (20 mg total) by mouth daily. 90 capsule 3  . SYNTHROID 100 MCG tablet TAKE ONE TABLET DAILY AND TAKE ONE-HALF (1/2) TABLET ON SUNDAY, 90 tablet 3   No current facility-administered medications on file prior to visit.     Review of Systems:  As  per HPI- otherwise negative. No fever or chills, no chest pain or shortness of breath   Physical Examination: Vitals:   05/19/18 0910  BP: 126/70  Pulse: (!) 56  Resp: 16  Temp: (!) 97.5 F (36.4 C)  SpO2: 97%   Vitals:   05/19/18 0910  Weight: 204 lb (92.5 kg)  Height: 5\' 5"  (1.651 m)   Body mass index is 33.95 kg/m. Ideal Body Weight: Weight in (lb) to have BMI = 25: 149.9  GEN: WDWN, NAD, Non-toxic, A & O x 3, obese, looks well HEENT: Atraumatic, Normocephalic. Neck supple. No masses, No LAD. Ears and Nose: No external deformity. CV: RRR, No M/G/R. No JVD. No thrill. No extra heart sounds. PULM: CTA B, no wheezes, crackles, rhonchi. No retractions. No resp. distress. No accessory muscle use. EXTR: No c/c/e NEURO Normal gait.  PSYCH: Normally interactive. Conversant. Not depressed or anxious appearing.  Calm demeanor.    Assessment and Plan: Dysthymia  Essential hypertension  Hypothyroidism, unspecified type - Plan: TSH  Gavin PoundDeborah is here today to discuss recurrent/persistent dysthymia.  She is treated with Prozac, which she has taken for many years.  She is not really sure the Prozac is helping, but at this point does not want to switch medications.  She denies any suicidal or homicidal ideation.  She is interested in doing therapy, to discuss some issues she has with her family.  She actually found a therapist she saw years ago, who was practicing with [facility].  She plans to go and see them which I agree is a great idea. She does have hyperparathyroidism, but a recent calcium level was normal.  We will check a  TSH today as she also has hypothyroidism.  Certainly if her TSH is high we will increase her thyroid replacement dose Blood pressure is under good control today. Suggested that she have the Shingrix vaccine  Signed Abbe AmsterdamJessica Copland, MD  Received her thyroid, it is within normal range Results for orders placed or performed in visit on 05/19/18  TSH  Result Value Ref Range   TSH 0.77 0.35 - 4.50 uIU/mL   Message to patient

## 2018-05-19 ENCOUNTER — Ambulatory Visit (INDEPENDENT_AMBULATORY_CARE_PROVIDER_SITE_OTHER): Payer: Medicare Other | Admitting: Family Medicine

## 2018-05-19 ENCOUNTER — Encounter: Payer: Self-pay | Admitting: Family Medicine

## 2018-05-19 ENCOUNTER — Other Ambulatory Visit: Payer: Self-pay | Admitting: *Deleted

## 2018-05-19 VITALS — BP 126/70 | HR 56 | Temp 97.5°F | Resp 16 | Ht 65.0 in | Wt 204.0 lb

## 2018-05-19 DIAGNOSIS — F341 Dysthymic disorder: Secondary | ICD-10-CM

## 2018-05-19 DIAGNOSIS — I10 Essential (primary) hypertension: Secondary | ICD-10-CM | POA: Diagnosis not present

## 2018-05-19 DIAGNOSIS — E039 Hypothyroidism, unspecified: Secondary | ICD-10-CM

## 2018-05-19 DIAGNOSIS — R0683 Snoring: Secondary | ICD-10-CM

## 2018-05-19 LAB — TSH: TSH: 0.77 u[IU]/mL (ref 0.35–4.50)

## 2018-05-19 NOTE — Patient Instructions (Addendum)
Let's check your tyroid today.  Assuming this is ok however, I think going back into therapy is a great idea.   Colen Darling is practicing at our [place] location-  I believe you can just call over there make an appointment.  However, let me know if any issues or if you need a referral. Let me know if you and Ms. Florez feel that we need to do any medication change  [address] [place] [address] [place]: [phone] Behavioral Medicine: [phone] Fax: [phone] Hours (M-F): 7am - 5pm   You might consider having shingrix done at your drug store

## 2018-05-27 ENCOUNTER — Encounter: Payer: Self-pay | Admitting: Pulmonary Disease

## 2018-05-27 ENCOUNTER — Telehealth: Payer: Self-pay | Admitting: Pulmonary Disease

## 2018-05-27 DIAGNOSIS — G4733 Obstructive sleep apnea (adult) (pediatric): Secondary | ICD-10-CM

## 2018-05-27 HISTORY — DX: Obstructive sleep apnea (adult) (pediatric): G47.33

## 2018-05-27 NOTE — Telephone Encounter (Signed)
Called and spoke with patient regarding results.  Informed the patient of results and recommendations today. Scheduled appt with EW for 05/30/2018 at 10am Pt verbalized understanding and denied any questions or concerns at this time.  Nothing further needed.

## 2018-05-27 NOTE — Telephone Encounter (Signed)
HST 05/15/18 >> AHI 5.6, SpO2 low 78%   Please let her know that the sleep study showed mild obstructive sleep apnea.  Please arrange for ROV with a nurse practitioner to review treatment options.

## 2018-05-29 NOTE — Progress Notes (Signed)
@Patient  ID: Gail Fuentes, female    DOB: [birth year], 73 y.o.   MRN: [id number]  Chief Complaint  Patient presents with  . Follow-up    results HST and therapy    Referring provider: Copland, Gwenlyn Found, MD  HPI: 73 year old female, former smoker. PMH significant for HTN, GERD, cough, OSA, hypothyroidism. Patient of Dr. Craige Cotta, seen on 04/11/18 for sleep consult. HST on 05/15/18 showed AHI 5.6, SpO2 78%. Normal spirometry. Cough consistent with upper airway cough syndrome, advised Flonase 1 spray in each nostril daily, allegra and prn albuterol.   05/30/2018 Patient presents today for follow-up to review home sleep test. HST showed mild sleep apnea, SpO2 low 78%. Feels well, no acute complaints. Has some daytime fatigue and interupted sleep. Denies cough and significant shortness of breath. She finds herself anxious throughout the day and reports holding her breath. She has upper and lower partial dentures and claustrophobia. Discussed treatment options including weight loss, dental device, CPAP and surgery.   Testing: 04/11/18 Spirometry- FEV1 2.1 (90%), RATIO 79 03/06/18- Eos 300, IgE 11, RAST positive for dust mites 2//8/18 ECHO- mild LVH, EF 60-65%, grade 1 DD  Allergies  Allergen Reactions  . Augmentin [Amoxicillin-Pot Clavulanate] Rash    Immunization History  Administered Date(s) Administered  . Influenza Split 03/11/2012, 02/22/2017  . Influenza Whole 02/20/2008, 03/06/2010  . Influenza, High Dose Seasonal PF 03/17/2013, 02/22/2017, 01/15/2018  . Influenza,inj,Quad PF,6+ Mos 03/04/2014  . Influenza-Unspecified 03/08/2015, 03/06/2016, 01/27/2018  . Pneumococcal Conjugate-13 07/18/2015  . Pneumococcal Polysaccharide-23 03/06/2013  . Td 07/18/2015    Past Medical History:  Diagnosis Date  . Abnormal EKG    per patient- states evaluated by Dr Daleen Squibb in the past  . Allergy    sneezing  late Fall  . Anemia 01/23/12   H/H 11.7/35  . Anxiety state, unspecified   .  Arthritis   . Benign paroxysmal positional vertigo   . Depression   . Depressive disorder, not elsewhere classified   . Diverticulosis   . Esophageal reflux   . Esophageal stricture   . Gallstones   . Heart murmur   . Hiatal hernia   . History of hyperparathyroidism   . History of UTI   . HTN (hypertension)   . Hyperlipidemia   . OSA (obstructive sleep apnea) 05/27/2018  . Other abnormal glucose   . Personal history of other diseases of digestive system    12-15 since 1990, no recurrence 2006  . PONV (postoperative nausea and vomiting)   . Unspecified hypothyroidism   . Vitamin D deficiency     Tobacco History: Social History   Tobacco Use  Smoking Status Former Smoker  . Last attempt to quit: 05/07/1973  . Years since quitting: 45.0  Smokeless Tobacco Never Used  Tobacco Comment   Smoked [phone] up to 2 ppd   Counseling given: Not Answered Comment: Smoked [phone] up to 2 ppd   Outpatient Medications Prior to Visit  Medication Sig Dispense Refill  . ALPRAZolam (XANAX) 0.5 MG tablet Take 0.5 mg by mouth 3 (three) times daily as needed for anxiety.     . Cholecalciferol (VITAMIN D3) 2000 UNITS TABS Take 2 tablets by mouth daily.     . cyanocobalamin 100 MCG tablet Take 100 mcg by mouth daily.    . fexofenadine (ALLEGRA) 180 MG tablet Take 180 mg by mouth as needed for allergies. Reported on 07/18/2015    . FLUoxetine (PROZAC) 40 MG capsule Take 40 mg by mouth every  morning.     . fluticasone (FLONASE) 50 MCG/ACT nasal spray Place 1 spray into both nostrils daily. 16 g 2  . hydrochlorothiazide (MICROZIDE) 12.5 MG capsule Take 1 capsule (12.5 mg total) by mouth daily. 90 capsule 2  . losartan (COZAAR) 50 MG tablet Take 1 tablet (50 mg total) by mouth daily. 90 tablet 3  . omeprazole (PRILOSEC) 20 MG capsule Take 1 capsule (20 mg total) by mouth daily. 90 capsule 3  . SYNTHROID 100 MCG tablet TAKE ONE TABLET DAILY AND TAKE ONE-HALF (1/2) TABLET ON SUNDAY, 90 tablet 3    No facility-administered medications prior to visit.     Review of Systems  Review of Systems  Constitutional: Positive for fatigue.  HENT: Negative.   Respiratory: Positive for cough. Negative for shortness of breath and wheezing.   Cardiovascular: Negative.   Psychiatric/Behavioral: Positive for sleep disturbance.    Physical Exam  BP 100/68 (BP Location: Left Arm, Cuff Size: Normal)   Pulse 62   Ht 5' 5.25" (1.657 m)   Wt 205 lb (93 kg)   SpO2 97%   BMI 33.85 kg/m  Physical Exam Constitutional:      Appearance: Normal appearance.  HENT:     Head: Normocephalic and atraumatic.     Right Ear: Tympanic membrane normal.     Left Ear: Tympanic membrane normal.     Mouth/Throat:     Mouth: Mucous membranes are moist.     Pharynx: Oropharynx is clear.  Neck:     Musculoskeletal: Normal range of motion and neck supple.  Cardiovascular:     Rate and Rhythm: Normal rate and regular rhythm.  Pulmonary:     Effort: Pulmonary effort is normal.     Breath sounds: Normal breath sounds. No wheezing, rhonchi or rales.  Neurological:     General: No focal deficit present.     Mental Status: She is alert. Mental status is at baseline.  Psychiatric:        Mood and Affect: Mood normal.        Behavior: Behavior normal.        Thought Content: Thought content normal.        Judgment: Judgment normal.      Lab Results:  CBC    Component Value Date/Time   WBC 6.5 03/06/2018 0000   WBC 5.5 12/02/2017 0924   RBC 4.39 03/06/2018 0000   RBC 4.41 12/02/2017 0924   HGB 13.4 03/06/2018 0000   HCT 39.4 03/06/2018 0000   PLT 317.0 12/02/2017 0924   MCV 90 03/06/2018 0000   MCH 30.5 03/06/2018 0000   MCH 30.4 02/10/2013 0940   MCHC 34.0 03/06/2018 0000   MCHC 33.6 12/02/2017 0924   RDW 11.6 (L) 03/06/2018 0000   LYMPHSABS 1.4 03/06/2018 0000   MONOABS 0.5 02/13/2017 1118   EOSABS 0.3 03/06/2018 0000   BASOSABS 0.0 03/06/2018 0000    BMET    Component Value Date/Time    NA 138 03/06/2018 0000   K 4.7 03/06/2018 0000   CL 101 03/06/2018 0000   CO2 25 03/06/2018 0000   GLUCOSE 93 03/06/2018 0000   GLUCOSE 79 11/15/2017 1017   GLUCOSE 103 (H) 05/13/2006 1031   BUN 10 03/06/2018 0000   CREATININE 0.82 03/06/2018 0000   CREATININE 0.75 11/15/2017 1017   CALCIUM 9.7 03/06/2018 0000   GFRNONAA 72 03/06/2018 0000   GFRNONAA 81 11/15/2017 1017   GFRAA 83 03/06/2018 0000   GFRAA 94 11/15/2017 1017  BNP No results found for: BNP  ProBNP No results found for: PROBNP  Imaging: No results found.   Assessment & Plan:   OSA (obstructive sleep apnea) - AHI 5.6, SpO2 78%; Mild obstructive sleep apnea with nocturnal hypoxia - Patient not a candidate for dental appliance d/t dentures - Reluctant to CPAP d/t claustrophobia - Advised weight loss and side sleeping position   - Check ONO, if significant hypoxia would advise in lab sleep test    FU in 6 weeks   Glenford BayleyElizabeth W Laniqua Torrens, NP 05/30/2018

## 2018-05-30 ENCOUNTER — Encounter: Payer: Self-pay | Admitting: Primary Care

## 2018-05-30 ENCOUNTER — Ambulatory Visit (INDEPENDENT_AMBULATORY_CARE_PROVIDER_SITE_OTHER): Payer: Medicare Other | Admitting: Primary Care

## 2018-05-30 VITALS — BP 100/68 | HR 62 | Ht 65.25 in | Wt 205.0 lb

## 2018-05-30 DIAGNOSIS — G4733 Obstructive sleep apnea (adult) (pediatric): Secondary | ICD-10-CM

## 2018-05-30 NOTE — Assessment & Plan Note (Signed)
-   AHI 5.6, SpO2 78%; Mild obstructive sleep apnea with nocturnal hypoxia - Patient not a candidate for dental appliance d/t dentures - Reluctant to CPAP d/t claustrophobia - Advised weight loss and side sleeping position   - Check ONO, if significant hypoxia would advise in lab sleep test

## 2018-05-30 NOTE — Patient Instructions (Addendum)
Home sleep test showed mild obstructive sleep apnea  Recommendations: Side sleeping Weight loss Avoid sedation medication and excessive alcohol prior to bedtime   Orders: Overnight oximetry   Sleep Apnea Sleep apnea is a condition in which breathing pauses or becomes shallow during sleep. Episodes of sleep apnea usually last 10 seconds or longer, and they may occur as many as 20 times an hour. Sleep apnea disrupts your sleep and keeps your body from getting the rest that it needs. This condition can increase your risk of certain health problems, including:  Heart attack.  Stroke.  Obesity.  Diabetes.  Heart failure.  Irregular heartbeat. There are three kinds of sleep apnea:  Obstructive sleep apnea. This kind is caused by a blocked or collapsed airway.  Central sleep apnea. This kind happens when the part of the brain that controls breathing does not send the correct signals to the muscles that control breathing.  Mixed sleep apnea. This is a combination of obstructive and central sleep apnea. What are the causes? The most common cause of this condition is a collapsed or blocked airway. An airway can collapse or become blocked if:  Your throat muscles are abnormally relaxed.  Your tongue and tonsils are larger than normal.  You are overweight.  Your airway is smaller than normal. What increases the risk? This condition is more likely to develop in people who:  Are overweight.  Smoke.  Have a smaller than normal airway.  Are elderly.  Are female.  Drink alcohol.  Take sedatives or tranquilizers.  Have a family history of sleep apnea. What are the signs or symptoms? Symptoms of this condition include:  Trouble staying asleep.  Daytime sleepiness and tiredness.  Irritability.  Loud snoring.  Morning headaches.  Trouble concentrating.  Forgetfulness.  Decreased interest in sex.  Unexplained sleepiness.  Mood swings.  Personality  changes.  Feelings of depression.  Waking up often during the night to urinate.  Dry mouth.  Sore throat. How is this diagnosed? This condition may be diagnosed with:  A medical history.  A physical exam.  A series of tests that are done while you are sleeping (sleep study). These tests are usually done in a sleep lab, but they may also be done at home. How is this treated? Treatment for this condition aims to restore normal breathing and to ease symptoms during sleep. It may involve managing health issues that can affect breathing, such as high blood pressure or obesity. Treatment may include:  Sleeping on your side.  Using a decongestant if you have nasal congestion.  Avoiding the use of depressants, including alcohol, sedatives, and narcotics.  Losing weight if you are overweight.  Making changes to your diet.  Quitting smoking.  Using a device to open your airway while you sleep, such as: ? An oral appliance. This is a custom-made mouthpiece that shifts your lower jaw forward. ? A continuous positive airway pressure (CPAP) device. This device delivers oxygen to your airway through a mask. ? A nasal expiratory positive airway pressure (EPAP) device. This device has valves that you put into each nostril. ? A bi-level positive airway pressure (BPAP) device. This device delivers oxygen to your airway through a mask.  Surgery if other treatments do not work. During surgery, excess tissue is removed to create a wider airway. It is important to get treatment for sleep apnea. Without treatment, this condition can lead to:  High blood pressure.  Coronary artery disease.  (Men) An inability to achieve  or maintain an erection (impotence).  Reduced thinking abilities. Follow these instructions at home:  Make any lifestyle changes that your health care provider recommends.  Eat a healthy, well-balanced diet.  Take over-the-counter and prescription medicines only as told  by your health care provider.  Avoid using depressants, including alcohol, sedatives, and narcotics.  Take steps to lose weight if you are overweight.  If you were given a device to open your airway while you sleep, use it only as told by your health care provider.  Do not use any tobacco products, such as cigarettes, chewing tobacco, and e-cigarettes. If you need help quitting, ask your health care provider.  Keep all follow-up visits as told by your health care provider. This is important. Contact a health care provider if:  The device that you received to open your airway during sleep is uncomfortable or does not seem to be working.  Your symptoms do not improve.  Your symptoms get worse. Get help right away if:  You develop chest pain.  You develop shortness of breath.  You develop discomfort in your back, arms, or stomach.  You have trouble speaking.  You have weakness on one side of your body.  You have drooping in your face. These symptoms may represent a serious problem that is an emergency. Do not wait to see if the symptoms will go away. Get medical help right away. Call your local emergency services (911 in the [place] Do not drive yourself to the hospital. This information is not intended to replace advice given to you by your health care provider. Make sure you discuss any questions you have with your health care provider. Document Released: 04/13/2002 Document Revised: 11/19/2016 Document Reviewed: 01/31/2015 Elsevier Interactive Patient Education  2019 [employer].

## 2018-05-30 NOTE — Progress Notes (Signed)
Reviewed and agree with assessment/plan.   Amelia Macken, MD [facility] 05/02/2016, 12:24 PM Pager:  [phone]  

## 2018-06-05 ENCOUNTER — Telehealth: Payer: Self-pay | Admitting: Pulmonary Disease

## 2018-06-05 NOTE — Telephone Encounter (Signed)
Re-faxed to [facility] & rec'd fax confirm.

## 2018-06-05 NOTE — Telephone Encounter (Signed)
Spoke with [place] from [place]. She confirmed the did not get a signed ONO and it was missing the demographic information. Confirmed the fax # [phone] was correct. Routed to [facility] to provide information requested from [place].   PCC's - please see above. Thank you.

## 2018-06-16 ENCOUNTER — Encounter: Payer: Self-pay | Admitting: Primary Care

## 2018-07-01 ENCOUNTER — Telehealth: Payer: Self-pay | Admitting: Primary Care

## 2018-07-01 NOTE — Telephone Encounter (Signed)
ONO showed average SpO2 92%, Low 89%. Spent 0 min <88%. Did not qualify/does not need supplemental oxygen at night. Continue cpap.   CC: Dr. Craige Cotta

## 2018-07-02 ENCOUNTER — Ambulatory Visit (INDEPENDENT_AMBULATORY_CARE_PROVIDER_SITE_OTHER): Payer: Medicare Other | Admitting: Psychology

## 2018-07-02 DIAGNOSIS — F330 Major depressive disorder, recurrent, mild: Secondary | ICD-10-CM | POA: Diagnosis not present

## 2018-07-02 NOTE — Telephone Encounter (Signed)
Spoke with pt.  Relayed results of ONO.  Pt wanted to know if she needed CPAP and if she does could we order it.  Beth please advise.

## 2018-07-02 NOTE — Telephone Encounter (Signed)
Pt stated she was waiting until she got results to see if she was to be on CPAP or oxygen.  I do not see an order.  Would you like an order? Beth, please advise.

## 2018-07-02 NOTE — Telephone Encounter (Signed)
lmtcb x1 pt 

## 2018-07-02 NOTE — Telephone Encounter (Signed)
Is she not on CPAP? Can you look at my last note, I thought she was supposed to be on it.

## 2018-07-02 NOTE — Telephone Encounter (Signed)
Ok, right she was reluctant to CPAP d/t reported claustrophobia. I would hold off on CPAP d/t OSA being mild and ONO showed no oxygen desaturation for now. Continue to recommend weight loss and side sleeping position. She has an apt with Dr. Craige Cotta for March. We can discuss further then.

## 2018-07-03 NOTE — Telephone Encounter (Signed)
Spoke with pt. She is aware of Beth's response. Nothing further was needed at this time. 

## 2018-07-03 NOTE — Telephone Encounter (Signed)
Patient is returning phone call.  Patient phone number is [phone].

## 2018-07-14 ENCOUNTER — Ambulatory Visit (INDEPENDENT_AMBULATORY_CARE_PROVIDER_SITE_OTHER): Payer: Medicare Other | Admitting: Pulmonary Disease

## 2018-07-14 ENCOUNTER — Encounter: Payer: Self-pay | Admitting: Pulmonary Disease

## 2018-07-14 VITALS — BP 118/74 | HR 56 | Ht 65.0 in | Wt 206.0 lb

## 2018-07-14 DIAGNOSIS — R05 Cough: Secondary | ICD-10-CM

## 2018-07-14 DIAGNOSIS — G4733 Obstructive sleep apnea (adult) (pediatric): Secondary | ICD-10-CM

## 2018-07-14 DIAGNOSIS — R058 Other specified cough: Secondary | ICD-10-CM

## 2018-07-14 NOTE — Progress Notes (Signed)
[facility]  Chief Complaint  Patient presents with  . Follow-up    Is doing better with changing sleep positions.     Constitutional:  BP 118/74 (BP Location: Left Arm, Patient Position: Sitting, Cuff Size: Normal)   Pulse (!) 56   Ht 5\' 5"  (1.651 m)   Wt 206 lb (93.4 kg)   SpO2 99%   BMI 34.28 kg/m   Past Medical History:  Allergies, Anemia, Anxiety, Vertigo, Depression, Diverticulosis, GERD, Esophageal stricture, Gallstones, Hiatal hernia, Hyperparathyroidism s/p Lt parathyroidectomy, HTN, HLD, Hypothyroidism, Vit D deficiency  Brief Summary:  Gail Fuentes is a 73 y.o. female former smoker with upper airway cough and mild obstructive sleep apnea.  Cough is better.  Still on allegra.  Not using flonase or albuterol.  Not having sputum, chest discomfort or wheezing.  Sleep study showed very mild sleep apnea.  ONO on room air was normal.  Sleeping on her side.  Working on weight reduction.  Sleeping better.  Feels more relaxed.  Not feeling as tired during the day.  Physical Exam:   Appearance - well kempt   ENMT - no sinus tenderness, no nasal discharge, no oral exudate, narrow nasal angles  Neck - no masses, trachea midline, no thyromegaly, no elevation in JVP  Respiratory - normal appearance of chest wall, normal respiratory effort w/o accessory muscle use, no dullness on percussion, no wheezing or rales  CV - s1s2 regular rate and rhythm, no murmurs, no peripheral edema, radial pulses symmetric  GI - soft, non tender  Lymph - no adenopathy noted in neck and axillary areas  MSK - normal gait  Ext - no cyanosis, clubbing, or joint inflammation noted  Skin - no rashes, lesions, or ulcers  Neuro - normal strength, oriented x 3  Psych - normal mood and affect  Assessment/Plan:   Upper airway cough syndrome. - continue allegra - prn flonase, albuterol  Obstructive sleep apnea. - very mild - not candidate for  oral appliance due to wearing dentures - continue weight loss efforts and positional therapy   Patient Instructions  Follow up in 1 year    Coralyn Helling, MD [facility] Pager: [phone] 07/14/2018, 2:31 PM  Flow Sheet     Pulmonary tests:  CBC 03/06/18 >> Eosinophils 300  RAST 03/06/18 >> dust mites, IgE 11 Spirometry 04/11/18 >> FEV1 2.1 (90%), FEV1% 79  Cardiac tests:  Echo 06/14/16 >> mild LVH, EF 60 to 65%, grade 1 DD  Sleep tests:  HST 05/15/18 >> AHI 5.6, SpO2 low 78%   Medications:   Allergies as of 07/14/2018      Reactions   Augmentin [amoxicillin-pot Clavulanate] Rash      Medication List       Accurate as of July 14, 2018  2:31 PM. Always use your most recent med list.        ALPRAZolam 0.5 MG tablet Commonly known as:  XANAX Take 0.5 mg by mouth 3 (three) times daily as needed for anxiety.   cyanocobalamin 100 MCG tablet Take 100 mcg by mouth daily.   fexofenadine 180 MG tablet Commonly known as:  ALLEGRA Take 180 mg by mouth as needed for allergies. Reported on 07/18/2015   FLUoxetine 40 MG capsule Commonly known as:  PROZAC Take 40 mg by mouth every morning.   fluticasone 50 MCG/ACT nasal spray Commonly known as:  FLONASE Place 1 spray into both nostrils daily.   hydrochlorothiazide 12.5 MG capsule Commonly  known as:  MICROZIDE Take 1 capsule (12.5 mg total) by mouth daily.   losartan 50 MG tablet Commonly known as:  COZAAR Take 1 tablet (50 mg total) by mouth daily.   omeprazole 20 MG capsule Commonly known as:  PRILOSEC Take 1 capsule (20 mg total) by mouth daily.   Synthroid 100 MCG tablet Generic drug:  levothyroxine TAKE ONE TABLET DAILY AND TAKE ONE-HALF (1/2) TABLET ON SUNDAY,   Vitamin D3 50 MCG (2000 UT) Tabs Take 2 tablets by mouth daily.       Past Surgical History:  She  has a past surgical history that includes Dental surgery (1962, 2012); Cesarean section (1977); Colonoscopy (12/2003);  Total abdominal hysterectomy w/ bilateral salpingoophorectomy (2005); Cholecystectomy (2004); and Parathyroidectomy (Left, 02/13/2013).  Family History:  Her family history includes Arthritis in her brother; COPD in her father and mother; Cancer in her brother; Colon polyps in her mother; Coronary artery disease in her father; Depression in her mother; Diabetes in her mother; Dysrhythmia in her mother; Heart attack (age of onset: 98) in her father; Heart disease in her brother; Hypertension in her brother; Kidney disease in her mother; Other in her brother; Prostate cancer in her brother and brother; Stroke in her maternal aunt and maternal grandmother; Stroke (age of onset: 26) in her maternal grandfather.  Social History:  She  reports that she quit smoking about 45 years ago. She has never used smokeless tobacco. She reports current alcohol use of about 14.0 standard drinks of alcohol per week. She reports that she does not use drugs.

## 2018-07-14 NOTE — Patient Instructions (Signed)
Follow up in 1 year.

## 2018-07-16 ENCOUNTER — Ambulatory Visit (INDEPENDENT_AMBULATORY_CARE_PROVIDER_SITE_OTHER): Payer: Medicare Other | Admitting: Psychology

## 2018-07-16 ENCOUNTER — Other Ambulatory Visit: Payer: Self-pay

## 2018-07-16 DIAGNOSIS — F330 Major depressive disorder, recurrent, mild: Secondary | ICD-10-CM

## 2018-07-30 ENCOUNTER — Ambulatory Visit (INDEPENDENT_AMBULATORY_CARE_PROVIDER_SITE_OTHER): Payer: Medicare Other | Admitting: Psychology

## 2018-07-30 DIAGNOSIS — F330 Major depressive disorder, recurrent, mild: Secondary | ICD-10-CM

## 2018-08-29 ENCOUNTER — Other Ambulatory Visit: Payer: Self-pay | Admitting: Family Medicine

## 2018-09-02 ENCOUNTER — Ambulatory Visit: Payer: Medicare Other | Admitting: Psychology

## 2018-09-24 ENCOUNTER — Ambulatory Visit: Payer: Medicare Other | Admitting: Psychology

## 2018-10-21 ENCOUNTER — Ambulatory Visit: Payer: Medicare Other | Admitting: Psychology

## 2018-10-28 ENCOUNTER — Ambulatory Visit (HOSPITAL_BASED_OUTPATIENT_CLINIC_OR_DEPARTMENT_OTHER)
Admission: RE | Admit: 2018-10-28 | Discharge: 2018-10-28 | Disposition: A | Discharge status: Home / Self Care | Payer: Medicare Other | Source: Ambulatory Visit | Attending: Family Medicine | Admitting: Family Medicine

## 2018-10-28 ENCOUNTER — Other Ambulatory Visit: Payer: Self-pay

## 2018-10-28 ENCOUNTER — Encounter: Payer: Self-pay | Admitting: Family Medicine

## 2018-10-28 ENCOUNTER — Ambulatory Visit (INDEPENDENT_AMBULATORY_CARE_PROVIDER_SITE_OTHER): Payer: Medicare Other | Admitting: Family Medicine

## 2018-10-28 VITALS — BP 120/68 | HR 68 | Temp 98.2°F | Ht 65.0 in | Wt 209.0 lb

## 2018-10-28 DIAGNOSIS — M25531 Pain in right wrist: Secondary | ICD-10-CM | POA: Diagnosis not present

## 2018-10-28 NOTE — Patient Instructions (Addendum)
Ice/cold pack over area for 10-15 min twice daily.  OK to take Tylenol 1000 mg (2 extra strength tabs) or 975 mg (3 regular strength tabs) every 6 hours as needed.  We will be in touch with your X-ray results on MyChart.  This looks broken to me, but we will see what the radiologist says.   Continue wearing the brace.   Neosporin or triple antibiotic ointment over the cuts/scrapes twice daily for 7-10 days. This is to prevent infection.

## 2018-10-28 NOTE — Progress Notes (Signed)
Musculoskeletal Exam  Patient: Gail Fuentes DOB: Apr 19, 1946  DOS: 10/28/2018  SUBJECTIVE:  Chief Complaint:   Chief Complaint  Patient presents with  . Fall    Gail Fuentes is a 73 y.o.  female for evaluation and treatment of R wrist pain.   Onset:  2 days ago. No inj or change in activity.  Location: R wrist; scraped L foot and R forehead Character:  aching  Progression of issue:  is unchanged Associated symptoms: bruising, swelling Treatment: to date has been ice, acetaminophen and bracing.   Neurovascular symptoms: no  ROS: Musculoskeletal/Extremities: +R wrist pain  Past Medical History:  Diagnosis Date  . Abnormal EKG    per patient- states evaluated by Dr Verl Blalock in the past  . Allergy    sneezing  late Fall  . Anemia 01/23/12   H/H 11.7/35  . Anxiety state, unspecified   . Arthritis   . Benign paroxysmal positional vertigo   . Depression   . Depressive disorder, not elsewhere classified   . Diverticulosis   . Esophageal reflux   . Esophageal stricture   . Gallstones   . Heart murmur   . Hiatal hernia   . History of hyperparathyroidism   . History of UTI   . HTN (hypertension)   . Hyperlipidemia   . OSA (obstructive sleep apnea) 05/27/2018  . Other abnormal glucose   . Personal history of other diseases of digestive system    12-15 since 1990, no recurrence 2006  . PONV (postoperative nausea and vomiting)   . Unspecified hypothyroidism   . Vitamin D deficiency     Objective: VITAL SIGNS: BP 120/68 (BP Location: Left Arm, Patient Position: Sitting, Cuff Size: Normal)   Pulse 68   Temp 98.2 F (36.8 C) (Oral)   Ht 5\' 5"  (1.651 m)   Wt 209 lb (94.8 kg)   SpO2 96%   BMI 34.78 kg/m  Constitutional: Well formed, well developed. No acute distress. Cardiovascular: Brisk cap refill Thorax & Lungs: No accessory muscle use Musculoskeletal: R wrist.   Normal active range of motion: yes.   Normal passive range of motion: yes Tenderness to  palpation: yes- over lateral distal radius Deformity: no Ecchymosis: yes Skin: Excoriation noted over R frontal bone laterally, 1st medial MTP Neurologic: Normal sensory function. No focal deficits noted. Psychiatric: Normal mood. Age appropriate judgment and insight. Alert & oriented x 3.    Assessment:  Right wrist pain - Plan: DG Wrist Complete Right  Plan: Tylenol, ice, brace. XR unofficially shows a nondisplaced distal lateral radial fracture. Await official read. If fx'd, will refer to sports med as it does not appear displaced. If neg, will send stretches/exercises. F/u prn. The patient voiced understanding and agreement to the plan.   [place], DO 10/28/18  2:44 PM

## 2018-10-29 ENCOUNTER — Other Ambulatory Visit: Payer: Self-pay | Admitting: Family Medicine

## 2018-10-29 DIAGNOSIS — S52514A Nondisplaced fracture of right radial styloid process, initial encounter for closed fracture: Secondary | ICD-10-CM

## 2018-10-29 NOTE — Progress Notes (Signed)
ambu

## 2018-10-30 ENCOUNTER — Other Ambulatory Visit: Payer: Self-pay

## 2018-10-30 ENCOUNTER — Encounter: Payer: Self-pay | Admitting: Family Medicine

## 2018-10-30 ENCOUNTER — Ambulatory Visit (INDEPENDENT_AMBULATORY_CARE_PROVIDER_SITE_OTHER): Payer: Medicare Other | Admitting: Family Medicine

## 2018-10-30 VITALS — BP 145/98 | HR 61 | Ht 65.0 in | Wt 209.0 lb

## 2018-10-30 DIAGNOSIS — S52514A Nondisplaced fracture of right radial styloid process, initial encounter for closed fracture: Secondary | ICD-10-CM | POA: Diagnosis not present

## 2018-10-30 NOTE — Patient Instructions (Signed)
Nice to meet you Please continue ice and tylenol  Please keep the brace on at all times  Please try Vitamin K2  You can look up Prolia. It helps with bone health.   Please send me a message in MyChart with any questions or updates.  Please see me back in 1 week.   --Dr. Raeford Razor

## 2018-10-30 NOTE — Assessment & Plan Note (Signed)
Had a fall onto the outstretched hand.  Fracture appears to be nondisplaced.  Possible for component of being intra-articular.  Reports having a low bone density. -Continue wrist brace. -Counseled on home exercise therapy and supportive care. -Counseled on vitamin K2 and Prolia. -Follow-up in 1 week to reimage.

## 2018-10-30 NOTE — Progress Notes (Signed)
Gail Fuentes - 73 y.o. female MRN [id number]  Date of birth: 11-17-1945  SUBJECTIVE:  Including CC & ROS.  Chief Complaint  Patient presents with  . Wrist Injury    right wrist  x 10/26/2018    Gail Fuentes is a 73 y.o. female that is presenting with right wrist pain.  She had a fall on 6/21 where she landed on the right side.  She is having some pain at the distal radius as well as ecchymosis.  The pain is improved and she has been wearing a wrist brace.  She does have swelling over this area.  She reports a history of low bone density.  She also hit her head but did not lose consciousness.  She has some bruising around her eye.  She also has an abrasion over the left foot.  She denies any numbness or tingling.  Has been taking Tylenol for the pain.  Pain is intermittent and throbbing.  Independent review of the right wrist x-ray from 6/23 shows a possible radial styloid fracture.   Review of Systems  Constitutional: Negative for fever.  HENT: Negative for congestion.   Respiratory: Negative for cough.   Cardiovascular: Negative for chest pain.  Gastrointestinal: Negative for abdominal pain.  Musculoskeletal: Positive for arthralgias and joint swelling.  Skin: Positive for color change.  Neurological: Negative for weakness.  Hematological: Bruises/bleeds easily.    HISTORY: Past Medical, Surgical, Social, and Family History Reviewed & Updated per EMR.   Pertinent Historical Findings include:  Past Medical History:  Diagnosis Date  . Abnormal EKG    per patient- states evaluated by Dr Daleen Squibb in the past  . Allergy    sneezing  late Fall  . Anemia 01/23/12   H/H 11.7/35  . Anxiety state, unspecified   . Arthritis   . Benign paroxysmal positional vertigo   . Depression   . Depressive disorder, not elsewhere classified   . Diverticulosis   . Esophageal reflux   . Esophageal stricture   . Gallstones   . Heart murmur   . Hiatal hernia   . History of  hyperparathyroidism   . History of UTI   . HTN (hypertension)   . Hyperlipidemia   . OSA (obstructive sleep apnea) 05/27/2018  . Other abnormal glucose   . Personal history of other diseases of digestive system    12-15 since 1990, no recurrence 2006  . PONV (postoperative nausea and vomiting)   . Unspecified hypothyroidism   . Vitamin D deficiency     Past Surgical History:  Procedure Laterality Date  . CESAREAN SECTION  1977  . CHOLECYSTECTOMY  2004  . COLONOSCOPY  12/2003   Tics  . DENTAL SURGERY  1962, 2012   Age 8-Abscessed Teeth Extracted   . PARATHYROIDECTOMY Left 02/13/2013   Procedure: LEFT INFERIOR PARATHYROIDECTOMY with frozen section ;  Surgeon: Velora Heckler, MD;  Location: [facility];  Service: General;  Laterality: Left;  . TOTAL ABDOMINAL HYSTERECTOMY W/ BILATERAL SALPINGOOPHORECTOMY  2005   Ovarian Cyst    Allergies  Allergen Reactions  . Augmentin [Amoxicillin-Pot Clavulanate] Rash    Family History  Problem Relation Age of Onset  . Coronary artery disease Father   . Heart attack Father 80  . COPD Father   . Diabetes Mother   . Dysrhythmia Mother   . Depression Mother   . COPD Mother   . Colon polyps Mother   . Kidney disease Mother   . Prostate cancer Brother   .  Other Brother        Wegener's in sinus; [place]  . Heart disease Brother   . Arthritis Brother   . Hypertension Brother   . Prostate cancer Brother   . Cancer Brother        Soft tissue in the face/temple  . Stroke Maternal Aunt        >65  . Stroke Maternal Grandmother        >65  . Stroke Maternal Grandfather 35     Social History   Socioeconomic History  . Marital status: Married    Spouse name: Not on file  . Number of children: 1  . Years of education: Not on file  . Highest education level: Not on file  Occupational History  . Occupation: Administrator, [employer] and homes    Employer: RETIRED  . Occupation: retired Air cabin crew  Social Needs  . Financial resource strain:  Not on file  . Food insecurity    Worry: Not on file    Inability: Not on file  . Transportation needs    Medical: Not on file    Non-medical: Not on file  Tobacco Use  . Smoking status: Former Smoker    Quit date: 05/07/1973    Years since quitting: 45.5  . Smokeless tobacco: Never Used  . Tobacco comment: Smoked 1968-1975 up to 2 ppd  Substance and Sexual Activity  . Alcohol use: Yes    Alcohol/week: 14.0 standard drinks    Types: 14 Standard drinks or equivalent per week    Comment: 2 nightly wines  . Drug use: No  . Sexual activity: Yes  Lifestyle  . Physical activity    Days per week: Not on file    Minutes per session: Not on file  . Stress: Not on file  Relationships  . Social Herbalist on phone: Not on file    Gets together: Not on file    Attends religious service: Not on file    Active member of club or organization: Not on file    Attends meetings of clubs or organizations: Not on file    Relationship status: Not on file  . Intimate partner violence    Fear of current or ex partner: Not on file    Emotionally abused: Not on file    Physically abused: Not on file    Forced sexual activity: Not on file  Other Topics Concern  . Not on file  Social History Narrative   Regular exercise: no   Caffeine use: daily     PHYSICAL EXAM:  VS: BP (!) 145/98   Pulse 61   Ht 5\' 5"  (1.651 m)   Wt 209 lb (94.8 kg)   BMI 34.78 kg/m  Physical Exam Gen: NAD, alert, cooperative with exam, well-appearing ENT: normal lips, normal nasal mucosa,  Eye: normal EOM, ecchymosis and swelling of the right lid and orbit CV:  no edema, +2 pedal pulses   Resp: no accessory muscle use, non-labored,  Skin: no rashes, no areas of induration  Neuro: normal tone, normal sensation to touch Psych:  normal insight, alert and oriented MSK:  Right wrist: Ecchymosis and swelling over the distal radius. Tenderness to palpation over the distal radius. Limited range of motion  secondary to pain in flexion and extension. Normal grip strength. Normal strength resistance with finger abduction abduction. Neurovascular intact     ASSESSMENT & PLAN:   Closed nondisplaced fracture of styloid process of right radius  Had a fall onto the outstretched hand.  Fracture appears to be nondisplaced.  Possible for component of being intra-articular.  Reports having a low bone density. -Continue wrist brace. -Counseled on home exercise therapy and supportive care. -Counseled on vitamin K2 and Prolia. -Follow-up in 1 week to reimage.

## 2018-11-06 ENCOUNTER — Other Ambulatory Visit: Payer: Self-pay

## 2018-11-06 ENCOUNTER — Ambulatory Visit (HOSPITAL_BASED_OUTPATIENT_CLINIC_OR_DEPARTMENT_OTHER)
Admission: RE | Admit: 2018-11-06 | Discharge: 2018-11-06 | Disposition: A | Discharge status: Home / Self Care | Payer: Medicare Other | Source: Ambulatory Visit | Attending: Family Medicine | Admitting: Family Medicine

## 2018-11-06 ENCOUNTER — Ambulatory Visit (INDEPENDENT_AMBULATORY_CARE_PROVIDER_SITE_OTHER): Payer: Medicare Other | Admitting: Family Medicine

## 2018-11-06 ENCOUNTER — Encounter: Payer: Self-pay | Admitting: Family Medicine

## 2018-11-06 VITALS — BP 113/63 | Ht 65.0 in | Wt 209.0 lb

## 2018-11-06 DIAGNOSIS — S52514D Nondisplaced fracture of right radial styloid process, subsequent encounter for closed fracture with routine healing: Secondary | ICD-10-CM | POA: Diagnosis present

## 2018-11-06 NOTE — Assessment & Plan Note (Signed)
Has good range of motion and no pain today. -X-ray. -Counseled on supportive care -Can consider referral to physical therapy.

## 2018-11-06 NOTE — Progress Notes (Signed)
KONA HOLLIMON - 73 y.o. female MRN [id number]  Date of birth: 10-16-1945  SUBJECTIVE:  Including CC & ROS.  No chief complaint on file.   AUBRIEE AZARI is a 73 y.o. female that is following up for her styloid process fracture.  She has been compliant with the brace.  Her fracture was about a week and a half ago.  She has had no pain.  She has had improvement of her swelling and ecchymosis.  Denies any numbness or tingling.    Review of Systems  Constitutional: Negative for fever.  HENT: Negative for congestion.   Respiratory: Negative for cough.   Cardiovascular: Negative for chest pain.  Gastrointestinal: Negative for abdominal pain.  Musculoskeletal: Positive for arthralgias.  Skin: Negative for color change.  Neurological: Negative for weakness.  Hematological: Negative for adenopathy.    HISTORY: Past Medical, Surgical, Social, and Family History Reviewed & Updated per EMR.   Pertinent Historical Findings include:  Past Medical History:  Diagnosis Date  . Abnormal EKG    per patient- states evaluated by Dr Daleen Squibb in the past  . Allergy    sneezing  late Fall  . Anemia 01/23/12   H/H 11.7/35  . Anxiety state, unspecified   . Arthritis   . Benign paroxysmal positional vertigo   . Depression   . Depressive disorder, not elsewhere classified   . Diverticulosis   . Esophageal reflux   . Esophageal stricture   . Gallstones   . Heart murmur   . Hiatal hernia   . History of hyperparathyroidism   . History of UTI   . HTN (hypertension)   . Hyperlipidemia   . OSA (obstructive sleep apnea) 05/27/2018  . Other abnormal glucose   . Personal history of other diseases of digestive system    12-15 since 1990, no recurrence 2006  . PONV (postoperative nausea and vomiting)   . Unspecified hypothyroidism   . Vitamin D deficiency     Past Surgical History:  Procedure Laterality Date  . CESAREAN SECTION  1977  . CHOLECYSTECTOMY  2004  . COLONOSCOPY  12/2003   Tics   . DENTAL SURGERY  1962, 2012   Age 35-Abscessed Teeth Extracted   . PARATHYROIDECTOMY Left 02/13/2013   Procedure: LEFT INFERIOR PARATHYROIDECTOMY with frozen section ;  Surgeon: Velora Heckler, MD;  Location: [facility];  Service: General;  Laterality: Left;  . TOTAL ABDOMINAL HYSTERECTOMY W/ BILATERAL SALPINGOOPHORECTOMY  2005   Ovarian Cyst    Allergies  Allergen Reactions  . Augmentin [Amoxicillin-Pot Clavulanate] Rash    Family History  Problem Relation Age of Onset  . Coronary artery disease Father   . Heart attack Father 73  . COPD Father   . Diabetes Mother   . Dysrhythmia Mother   . Depression Mother   . COPD Mother   . Colon polyps Mother   . Kidney disease Mother   . Prostate cancer Brother   . Other Brother        Wegener's in sinus; [facility]  . Heart disease Brother   . Arthritis Brother   . Hypertension Brother   . Prostate cancer Brother   . Cancer Brother        Soft tissue in the face/temple  . Stroke Maternal Aunt        >65  . Stroke Maternal Grandmother        >65  . Stroke Maternal Grandfather 26     Social History   Socioeconomic History  .  Marital status: Married    Spouse name: Not on file  . Number of children: 1  . Years of education: Not on file  . Highest education level: Not on file  Occupational History  . Occupation: Administrator, [employer] and homes    Employer: RETIRED  . Occupation: retired Air cabin crew  Social Needs  . Financial resource strain: Not on file  . Food insecurity    Worry: Not on file    Inability: Not on file  . Transportation needs    Medical: Not on file    Non-medical: Not on file  Tobacco Use  . Smoking status: Former Smoker    Quit date: 05/07/1973    Years since quitting: 45.5  . Smokeless tobacco: Never Used  . Tobacco comment: Smoked 1968-1975 up to 2 ppd  Substance and Sexual Activity  . Alcohol use: Yes    Alcohol/week: 14.0 standard drinks    Types: 14 Standard drinks or equivalent per week    Comment:  2 nightly wines  . Drug use: No  . Sexual activity: Yes  Lifestyle  . Physical activity    Days per week: Not on file    Minutes per session: Not on file  . Stress: Not on file  Relationships  . Social Herbalist on phone: Not on file    Gets together: Not on file    Attends religious service: Not on file    Active member of club or organization: Not on file    Attends meetings of clubs or organizations: Not on file    Relationship status: Not on file  . Intimate partner violence    Fear of current or ex partner: Not on file    Emotionally abused: Not on file    Physically abused: Not on file    Forced sexual activity: Not on file  Other Topics Concern  . Not on file  Social History Narrative   Regular exercise: no   Caffeine use: daily     PHYSICAL EXAM:  VS: There were no vitals taken for this visit. Physical Exam Gen: NAD, alert, cooperative with exam, well-appearing ENT: normal lips, normal nasal mucosa,  Eye: normal EOM, normal conjunctiva and lids CV:  no edema, +2 pedal pulses   Resp: no accessory muscle use, non-labored,  Skin: no rashes, no areas of induration  Neuro: normal tone, normal sensation to touch Psych:  normal insight, alert and oriented MSK:  Right wrist:  No swelling or ecchymosis  Tender to palpation over the distal radius. Normal passive wrist range of motion. Normal grip strength. Normal strength resistance with finger abduction and adduction. Neurovascular intact     ASSESSMENT & PLAN:   Closed nondisplaced fracture of styloid process of right radius Has good range of motion and no pain today. -X-ray. -Counseled on supportive care -Can consider referral to physical therapy.

## 2018-11-06 NOTE — Patient Instructions (Signed)
Good to see you I will call you with the results from today   Please send me a message in MyChart with any questions or updates.  Please see me back in 3 weeks.   --Dr. Raeford Razor

## 2018-11-11 ENCOUNTER — Telehealth: Payer: Self-pay | Admitting: Family Medicine

## 2018-11-11 NOTE — Telephone Encounter (Signed)
Left VM for patient. If she calls back please have her speak with a nurse/CMA and inform that her xray looks stable. We can send to physical therapy. Ask if she has thought prolia any more. Bone health medication.   If any questions then please take the best time and phone number to call and I will try to call her back.   Rosemarie Ax, MD [facility] 11/11/2018, 8:30 AM

## 2018-11-12 ENCOUNTER — Telehealth: Payer: Self-pay | Admitting: *Deleted

## 2018-11-12 DIAGNOSIS — S52514D Nondisplaced fracture of right radial styloid process, subsequent encounter for closed fracture with routine healing: Secondary | ICD-10-CM

## 2018-11-12 NOTE — Addendum Note (Signed)
Addended by: Sherrie George F on: 11/12/2018 09:17 AM   Modules accepted: Orders

## 2018-11-12 NOTE — Telephone Encounter (Signed)
Spoke to patient and she would like to do Physical Therapy next door. Will send in order. She wants to wait on the medicine at this time.

## 2018-11-13 ENCOUNTER — Other Ambulatory Visit: Payer: Self-pay

## 2018-11-13 ENCOUNTER — Encounter: Payer: Self-pay | Admitting: Physical Therapy

## 2018-11-13 ENCOUNTER — Ambulatory Visit: Payer: Medicare Other | Attending: Family Medicine | Admitting: Physical Therapy

## 2018-11-13 DIAGNOSIS — M25531 Pain in right wrist: Secondary | ICD-10-CM | POA: Insufficient documentation

## 2018-11-13 DIAGNOSIS — M25631 Stiffness of right wrist, not elsewhere classified: Secondary | ICD-10-CM | POA: Diagnosis present

## 2018-11-13 DIAGNOSIS — R600 Localized edema: Secondary | ICD-10-CM | POA: Diagnosis present

## 2018-11-13 DIAGNOSIS — M6281 Muscle weakness (generalized): Secondary | ICD-10-CM | POA: Insufficient documentation

## 2018-11-13 NOTE — Therapy (Signed)
[facility] [address]  [address] [place], [place], [address] Phone: [phone]   Fax:  [phone]  Physical Therapy Evaluation  Patient Details  Name: Gail Fuentes MRN: [id number] Date of Birth: Sep 17, 1945 Referring Provider (PT): Clare Gandy, MD    Encounter Date: 11/13/2018  PT End of Session - 11/13/18 1518    Visit Number  1    Number of Visits  5    Date for PT Re-Evaluation  12/11/18    Authorization Type  UHC Medicare    PT Start Time  1314    PT Stop Time  1405    PT Time Calculation (min)  51 min    Activity Tolerance  Patient tolerated treatment well    Behavior During Therapy  [facility] for tasks assessed/performed       Past Medical History:  Diagnosis Date  . Abnormal EKG    per patient- states evaluated by Dr Daleen Squibb in the past  . Allergy    sneezing  late Fall  . Anemia 01/23/12   H/H 11.7/35  . Anxiety state, unspecified   . Arthritis   . Benign paroxysmal positional vertigo   . Depression   . Depressive disorder, not elsewhere classified   . Diverticulosis   . Esophageal reflux   . Esophageal stricture   . Gallstones   . Heart murmur   . Hiatal hernia   . History of hyperparathyroidism   . History of UTI   . HTN (hypertension)   . Hyperlipidemia   . OSA (obstructive sleep apnea) 05/27/2018  . Other abnormal glucose   . Personal history of other diseases of digestive system    12-15 since 1990, no recurrence 2006  . PONV (postoperative nausea and vomiting)   . Unspecified hypothyroidism   . Vitamin D deficiency     Past Surgical History:  Procedure Laterality Date  . CESAREAN SECTION  1977  . CHOLECYSTECTOMY  2004  . COLONOSCOPY  12/2003   Tics  . DENTAL SURGERY  1962, 2012   Age 73-Abscessed Teeth Extracted   . PARATHYROIDECTOMY Left 02/13/2013   Procedure: LEFT INFERIOR PARATHYROIDECTOMY with frozen section ;  Surgeon: Velora Heckler, MD;  Location: [facility];  Service: General;   Laterality: Left;  . TOTAL ABDOMINAL HYSTERECTOMY W/ BILATERAL SALPINGOOPHORECTOMY  2005   Ovarian Cyst    There were no vitals filed for this visit.   Subjective Assessment - 11/13/18 1317    Subjective  Patient reports that on 06/21 she tripped over a threshold and fell on her R side, hitting her head. Saw MD for R hand pain a couple days later who did x-ray and found a fracture in her wrist. Started wearing wrist brace d/t bruising. Sports MD told her to wear her brace most of the time but avoid lifting. Will be able to take brace off on 07/12. Pain is located over R radial distal forearm and lateral shoulder. Worse with trying to make the bed, using curling iron. Better with brace.    Pertinent History  anemia, anxiety, BPPV, depression, hiatal hernia, HTN, HLD, hypothyroidism    Limitations  Lifting;House hold activities    Diagnostic tests  11/06/18 R wrist xray: Stable appearance of the nondisplaced intra-articular fracture of the radial styloid    Patient Stated Goals  "to feel that this is healed and to return to my normal activities"    Currently in Pain?  No/denies    Pain Score  0-No pain    Pain Location  Wrist    Pain Orientation  Right   distal radius   Pain Descriptors / Indicators  Dull    Pain Type  Acute pain         [facility] PT Assessment - 11/13/18 1335      Assessment   Medical Diagnosis  Closed nondisplaced fx of radial styloid    Referring Provider (PT)  Clare GandyJeremy Schmitz, MD    Onset Date/Surgical Date  10/26/18    Hand Dominance  Right    Next MD Visit  11/27/18    Prior Therapy  yes      Precautions   Precautions  --   osteopenia; wear brace until 07/21, no lifting     Balance Screen   Has the patient fallen in the past 6 months  Yes    How many times?  1    Has the patient had a decrease in activity level because of a fear of falling?   No    Is the patient reluctant to leave their home because of a fear of falling?   No      Home Technical brewernvironment    Living Environment  Private residence    Living Arrangements  Spouse/significant other    Available Help at Discharge  Family      Prior Function   Level of Independence  Independent    Vocation  Retired    Leisure  shopping      Cognition   Overall Cognitive Status  Within Functional Limits for tasks assessed      Observation/Other Assessments   Observations  swelling over radial side of R wrist    Focus on Therapeutic Outcomes (FOTO)   Wrist: 67 (33% limited, 30% predicted)      Sensation   Light Touch  Appears Intact   intermittent tingling in R fingers     Coordination   Gross Motor Movements are Fluid and Coordinated  Yes      Posture/Postural Control   Posture/Postural Control  Postural limitations    Postural Limitations  Rounded Shoulders;Forward head;Weight shift right      ROM / Strength   AROM / PROM / Strength  AROM;Strength      AROM   AROM Assessment Site  Wrist;Shoulder    Right/Left Shoulder  --   WNL and symmetrical shoulder AROM   Right/Left Wrist  Left    Right Wrist Extension  55 Degrees    Right Wrist Flexion  58 Degrees    Right Wrist Radial Deviation  17 Degrees    Right Wrist Ulnar Deviation  20 Degrees   1-2/10 pain in distal radius   Left Wrist Extension  70 Degrees    Left Wrist Flexion  70 Degrees    Left Wrist Radial Deviation  25 Degrees    Left Wrist Ulnar Deviation  35 Degrees      Strength   Strength Assessment Site  Shoulder;Elbow;Wrist;Hand    Right/Left Shoulder  Right;Left    Right Shoulder Flexion  4+/5    Right Shoulder ABduction  4/5    Right Shoulder Internal Rotation  4/5   pain in lateral shoulder   Right Shoulder External Rotation  4/5    Left Shoulder Flexion  4+/5    Left Shoulder ABduction  4/5    Left Shoulder Internal Rotation  4+/5    Left Shoulder External Rotation  4+/5    Right/Left Elbow  Right;Left  Right Elbow Flexion  4+/5    Right Elbow Extension  4+/5    Left Elbow Flexion  4+/5    Left Elbow  Extension  4+/5    Right/Left Wrist  Left;Right    Left Wrist Flexion  4+/5    Left Wrist Extension  4+/5    Left Wrist Radial Deviation  4+/5    Left Wrist Ulnar Deviation  4+/5    Right/Left hand  Right;Left    Right Hand Grip (lbs)  0    Right Hand Lateral Pinch  11 lbs    Left Hand Grip (lbs)  20    Left Hand Lateral Pinch  14 lbs      Palpation   Palpation comment  TTP in L posterior deltoid and proximal biceps tendon, L disal radius                Objective measurements completed on examination: See above findings.              PT Education - 11/13/18 1517    Education Details  prognosis, POC, HEP; advised to use ice pack to R wrist 10-2min or ice massage no more than 5 min    Person(s) Educated  Patient    Methods  Explanation;Demonstration;Tactile cues;Verbal cues;Handout    Comprehension  Verbalized understanding;Returned demonstration       PT Short Term Goals - 11/13/18 1525      PT SHORT TERM GOAL #1   Title  Patient to be independent with initial HEP.    Time  2    Period  Weeks    Status  New    Target Date  11/27/18        PT Long Term Goals - 11/13/18 1525      PT LONG TERM GOAL #1   Title  Patient to be independent with advanced HEP.    Time  4    Period  Weeks    Status  New    Target Date  12/11/18      PT LONG TERM GOAL #2   Title  Patient to demonstrate B UE strength >=4+/5.    Time  4    Period  Weeks    Status  New    Target Date  12/11/18      PT LONG TERM GOAL #3   Title  Patient to demonstrate R wrist AROM symmetrical to opposite side and nonpainful.    Time  4    Period  Weeks    Status  New    Target Date  12/11/18      PT LONG TERM GOAL #4   Title  Patient to demonstrate R hand grip strength >=20 lbs.    Time  4    Period  Weeks    Status  New    Target Date  12/11/18             Plan - 11/13/18 1518    Clinical Impression Statement  Patient is a 73y/o F presenting to OPPT with c/o R wrist  pain since 06/21 when she tripped and fell on R side. Reports hitting her head but denies HA or dizziness. Recent R wrist x-ray revealed stable nondisplaced intra-articular radial styloid fx. Pain is located over radial styloid and lateral shoulder. Worse with trying to make the bed and using a curling iron. Patient today with limited and painful R wrist AROM, decreased R shoulder strength, decreased R grip strength, and tenderness  and edema over radial side of wrist. Educated patient on gentle PROM and grip strengthening- patient reported understanding. Would benefit from skilled PT services 1x/week for 4 weeks to address aforementioned impairments.    Personal Factors and Comorbidities  Age;Comorbidity 3+;Time since onset of injury/illness/exacerbation;Fitness;Past/Current Experience    Comorbidities  ostepenia, anemia, anxiety, BPPV, depression, hiatal hernia, HTN, HLD, hypothyroidism    Examination-Activity Limitations  Caring for Others;Carry;Dressing;Transfers;Hygiene/Grooming;Reach Overhead;Lift    Examination-Participation Restrictions  Cleaning;Shop;Driving;Yard Work;Interpersonal Relationship;Laundry;Meal Prep    Stability/Clinical Decision Making  Stable/Uncomplicated    Clinical Decision Making  Low    Rehab Potential  Good    PT Frequency  1x / week    PT Duration  4 weeks    PT Treatment/Interventions  ADLs/Self Care Home Management;Cryotherapy;Electrical Stimulation;Moist Heat;Ultrasound;Functional mobility training;Therapeutic activities;Therapeutic exercise;Manual techniques;Patient/family education;Neuromuscular re-education;Passive range of motion;Dry needling;Energy conservation;Splinting;Taping;Vasopneumatic Device    PT Next Visit Plan  reassess HEP    Consulted and Agree with Plan of Care  Patient       Patient will benefit from skilled therapeutic intervention in order to improve the following deficits and impairments:  Hypomobility, Increased edema, Decreased activity  tolerance, Decreased strength, Impaired UE functional use, Pain, Decreased range of motion, Postural dysfunction, Impaired flexibility  Visit Diagnosis: 1. Pain in right wrist   2. Stiffness of right wrist, not elsewhere classified   3. Muscle weakness (generalized)   4. Localized edema        Problem List Patient Active Problem List   Diagnosis Date Noted  . Closed nondisplaced fracture of styloid process of right radius 10/30/2018  . OSA (obstructive sleep apnea) 05/27/2018  . History of hyperparathyroidism 08/24/2014  . Obesity (BMI 30-39.9) 10/08/2013  . HTN (hypertension) 07/13/2013  . Elevated blood pressure reading without diagnosis of hypertension 06/29/2013  . Osteoporosis 11/14/2012  . Acute iritis of both eyes 10/17/2012  . Thyroid nodule 10/17/2012  . Cardiomegaly 02/20/2012  . DISTURBANCES OF SENSATION OF SMELL AND TASTE 04/19/2010  . Vitamin D deficiency 12/24/2008  . ANXIETY 12/24/2008  . DEPRESSION 12/24/2008  . BENIGN PAROXYSMAL POSITIONAL VERTIGO 12/24/2008  . HYPERGLYCEMIA 12/24/2008  . Nonspecific abnormal electrocardiogram (ECG) (EKG) 12/24/2008  . Hyperlipidemia 07/23/2007  . Hypothyroidism 07/22/2007  . GERD 07/22/2007  . DIVERTICULITIS, HX OF 07/22/2007     Anette GuarneriYevgeniya Sholonda Jobst, PT, DPT 11/13/18 3:28 PM   [facility] [address]  [address] [place], [place], [address] Phone: [phone]   Fax:  [phone]  Name: Gail Fuentes MRN: [id number] Date of Birth: 1945-10-02

## 2018-11-14 ENCOUNTER — Encounter: Payer: Self-pay | Admitting: Family Medicine

## 2018-11-18 ENCOUNTER — Other Ambulatory Visit: Payer: Self-pay

## 2018-11-19 ENCOUNTER — Ambulatory Visit: Payer: Medicare Other

## 2018-11-19 DIAGNOSIS — M25531 Pain in right wrist: Secondary | ICD-10-CM | POA: Diagnosis not present

## 2018-11-19 DIAGNOSIS — M6281 Muscle weakness (generalized): Secondary | ICD-10-CM

## 2018-11-19 DIAGNOSIS — R600 Localized edema: Secondary | ICD-10-CM

## 2018-11-19 DIAGNOSIS — M25631 Stiffness of right wrist, not elsewhere classified: Secondary | ICD-10-CM

## 2018-11-19 NOTE — Therapy (Signed)
[facility] [address]  [place], [place], [address] Phone: [phone]   Fax:  [phone]  Physical Therapy Treatment  Patient Details  Name: Gail Fuentes MRN: [id number] Date of Birth: 08-27-1945 Referring Provider (PT): Clearance Coots, MD   Encounter Date: 11/19/2018  PT End of Session - 11/19/18 1027    Visit Number  2    Number of Visits  5    Date for PT Re-Evaluation  12/11/18    Authorization Type  UHC Medicare    PT Start Time  1017    PT Stop Time  1110   Ended visit with 10 min moist heat and 10 min ice pack to wrist   PT Time Calculation (min)  53 min    Activity Tolerance  Patient tolerated treatment well    Behavior During Therapy  [facility] for tasks assessed/performed       Past Medical History:  Diagnosis Date  . Abnormal EKG    per patient- states evaluated by Dr Verl Blalock in the past  . Allergy    sneezing  late Fall  . Anemia 01/23/12   H/H 11.7/35  . Anxiety state, unspecified   . Arthritis   . Benign paroxysmal positional vertigo   . Depression   . Depressive disorder, not elsewhere classified   . Diverticulosis   . Esophageal reflux   . Esophageal stricture   . Gallstones   . Heart murmur   . Hiatal hernia   . History of hyperparathyroidism   . History of UTI   . HTN (hypertension)   . Hyperlipidemia   . OSA (obstructive sleep apnea) 05/27/2018  . Other abnormal glucose   . Personal history of other diseases of digestive system    12-15 since 1990, no recurrence 2006  . PONV (postoperative nausea and vomiting)   . Unspecified hypothyroidism   . Vitamin D deficiency     Past Surgical History:  Procedure Laterality Date  . [place]  . CHOLECYSTECTOMY  2004  . COLONOSCOPY  12/2003   Tics  . DENTAL SURGERY  1962, 2012   Age 19-Abscessed Teeth Extracted   . PARATHYROIDECTOMY Left 02/13/2013   Procedure: LEFT INFERIOR PARATHYROIDECTOMY with frozen section ;   Surgeon: Earnstine Regal, MD;  Location: [facility];  Service: General;  Laterality: Left;  . TOTAL ABDOMINAL HYSTERECTOMY W/ BILATERAL SALPINGOOPHORECTOMY  2005   Ovarian Cyst    There were no vitals filed for this visit.  Subjective Assessment - 11/19/18 1021    Subjective  Pt. noting she had comfusion on one of her HEP activities.    Pertinent History  anemia, anxiety, BPPV, depression, hiatal hernia, HTN, HLD, hypothyroidism    Diagnostic tests  11/06/18 R wrist xray: Stable appearance of the nondisplaced intra-articular fracture of the radial styloid    Patient Stated Goals  "to feel that this is healed and to return to my normal activities"    Currently in Pain?  Yes    Pain Score  --   pain up to 2/10 pain at most   Pain Location  Wrist    Pain Orientation  Right    Pain Descriptors / Indicators  --   "stiffness"   Pain Type  Acute pain    Aggravating Factors   using R hand    Pain Relieving Factors  rest    Multiple Pain Sites  No  [facility] Adult PT Treatment/Exercise - 11/19/18 0001      Shoulder Exercises: Stretch   Other Shoulder Stretches  B UT stretch with therapist x 30 sec each wa    Other Shoulder Stretches  B LS stretch with therapist x 30 sec each way       Hand Exercises   Other Hand Exercises  R towel grip squeeze 2 x 10 reps; 3" hold       Wrist Exercises   Wrist Flexion  Right;AAROM;10 reps    Wrist Flexion Limitations  5" hold - L hand assist     Wrist Extension  Right;AAROM;10 reps    Wrist Extension Limitations  5" hold - L hand assistance     Wrist Radial Deviation  Right;10 reps;AROM    Wrist Radial Deviation Limitations  resting on table - parallel to gravity     Wrist Ulnar Deviation  Right;10 reps;AROM    Wrist Ulnar Deviation Limitations  resting on table - parallel to gravity     Other wrist exercises  Gentle R wrist flexion/extension stretch x 30 sec with L hand assistance       Modalities   Modalities  Moist  Heat;Cryotherapy      Moist Heat Therapy   Number Minutes Moist Heat  10 Minutes    Moist Heat Location  Shoulder   B Upper trap      Cryotherapy   Number Minutes Cryotherapy  10 Minutes    Cryotherapy Location  Wrist   R wrist    Type of Cryotherapy  Ice pack               PT Short Term Goals - 11/19/18 1028      PT SHORT TERM GOAL #1   Title  Patient to be independent with initial HEP.    Time  2    Period  Weeks    Status  On-going    Target Date  11/27/18        PT Long Term Goals - 11/19/18 1028      PT LONG TERM GOAL #1   Title  Patient to be independent with advanced HEP.    Time  4    Period  Weeks    Status  On-going      PT LONG TERM GOAL #2   Title  Patient to demonstrate B UE strength >=4+/5.    Time  4    Period  Weeks    Status  On-going      PT LONG TERM GOAL #3   Title  Patient to demonstrate R wrist AROM symmetrical to opposite side and nonpainful.    Time  4    Period  Weeks    Status  On-going      PT LONG TERM GOAL #4   Title  Patient to demonstrate R hand grip strength >=20 lbs.    Time  4    Period  Weeks    Status  On-going            Plan - 11/19/18 1029    Clinical Impression Statement  Pt. doing ok today.  Has some questions regarding HEP performance.  Session focusing on HEP review and mild progression of ROM and grip strengthening activities.  Ended visit with ice pack to R wrist to reduce post-exercise swelling.  Pt. verbalizing improved HEP understanding to end session.    Personal Factors and Comorbidities  Age;Comorbidity 3+;Time since onset of injury/illness/exacerbation;Fitness;Past/Current Experience  Comorbidities  ostepenia, anemia, anxiety, BPPV, depression, hiatal hernia, HTN, HLD, hypothyroidism    PT Treatment/Interventions  ADLs/Self Care Home Management;Cryotherapy;Electrical Stimulation;Moist Heat;Ultrasound;Functional mobility training;Therapeutic activities;Therapeutic exercise;Manual  techniques;Patient/family education;Neuromuscular re-education;Passive range of motion;Dry needling;Energy conservation;Splinting;Taping;Vasopneumatic Device    PT Next Visit Plan  progress ROM; UE strengthening activities per pt. tolernce; modalities prn    Consulted and Agree with Plan of Care  Patient       Patient will benefit from skilled therapeutic intervention in order to improve the following deficits and impairments:  Hypomobility, Increased edema, Decreased activity tolerance, Decreased strength, Impaired UE functional use, Pain, Decreased range of motion, Postural dysfunction, Impaired flexibility  Visit Diagnosis: 1. Pain in right wrist   2. Stiffness of right wrist, not elsewhere classified   3. Muscle weakness (generalized)   4. Localized edema        Problem List Patient Active Problem List   Diagnosis Date Noted  . Closed nondisplaced fracture of styloid process of right radius 10/30/2018  . OSA (obstructive sleep apnea) 05/27/2018  . History of hyperparathyroidism 08/24/2014  . Obesity (BMI 30-39.9) 10/08/2013  . HTN (hypertension) 07/13/2013  . Elevated blood pressure reading without diagnosis of hypertension 06/29/2013  . Osteoporosis 11/14/2012  . Acute iritis of both eyes 10/17/2012  . Thyroid nodule 10/17/2012  . Cardiomegaly 02/20/2012  . DISTURBANCES OF SENSATION OF SMELL AND TASTE 04/19/2010  . Vitamin D deficiency 12/24/2008  . ANXIETY 12/24/2008  . DEPRESSION 12/24/2008  . BENIGN PAROXYSMAL POSITIONAL VERTIGO 12/24/2008  . HYPERGLYCEMIA 12/24/2008  . Nonspecific abnormal electrocardiogram (ECG) (EKG) 12/24/2008  . Hyperlipidemia 07/23/2007  . Hypothyroidism 07/22/2007  . GERD 07/22/2007  . DIVERTICULITIS, HX OF 07/22/2007    Kermit Balo, PTA 11/19/18 11:34 AM    [facility] [address]  [address] [place], [place], [address] Phone: [phone]   Fax:  [phone]  Name: Gail Fuentes MRN: [id number] Date of Birth: 1945-12-16

## 2018-11-20 ENCOUNTER — Encounter: Payer: Self-pay | Admitting: Internal Medicine

## 2018-11-20 ENCOUNTER — Other Ambulatory Visit: Payer: Self-pay

## 2018-11-20 ENCOUNTER — Ambulatory Visit (INDEPENDENT_AMBULATORY_CARE_PROVIDER_SITE_OTHER): Payer: Medicare Other | Admitting: Internal Medicine

## 2018-11-20 VITALS — BP 120/70 | HR 60 | Ht 64.75 in | Wt 208.0 lb

## 2018-11-20 DIAGNOSIS — S52514D Nondisplaced fracture of right radial styloid process, subsequent encounter for closed fracture with routine healing: Secondary | ICD-10-CM

## 2018-11-20 DIAGNOSIS — E041 Nontoxic single thyroid nodule: Secondary | ICD-10-CM | POA: Diagnosis not present

## 2018-11-20 DIAGNOSIS — E039 Hypothyroidism, unspecified: Secondary | ICD-10-CM | POA: Diagnosis not present

## 2018-11-20 DIAGNOSIS — M8080XD Other osteoporosis with current pathological fracture, unspecified site, subsequent encounter for fracture with routine healing: Secondary | ICD-10-CM

## 2018-11-20 DIAGNOSIS — E559 Vitamin D deficiency, unspecified: Secondary | ICD-10-CM | POA: Diagnosis not present

## 2018-11-20 DIAGNOSIS — Z8639 Personal history of other endocrine, nutritional and metabolic disease: Secondary | ICD-10-CM | POA: Diagnosis not present

## 2018-11-20 LAB — VITAMIN D 25 HYDROXY (VIT D DEFICIENCY, FRACTURES): VITD: 62.76 ng/mL (ref 30.00–100.00)

## 2018-11-20 LAB — TSH: TSH: 1.19 u[IU]/mL (ref 0.35–4.50)

## 2018-11-20 LAB — T4, FREE: Free T4: 1.04 ng/dL (ref 0.60–1.60)

## 2018-11-20 NOTE — Progress Notes (Signed)
Subjective:     Patient ID: Gail Fuentes, female   DOB: [birth year], 74 y.o.   MRN: [id number]  HPI Gail Fuentes is a pleasant 73 y/o returning for f/u for dx hyperparathyroidism, s/p parathyroidectomy, hypothyroidism, and a R thyroid nodule. Last visit 1 year ago.  Since last OV, she fell >> R styloid radial fracture 10/2018.  She has L leg sciatica.  Reviewed history:: Pt had a thyroid ultrasound (10/08/2012) showing only a right thyroid nodule, 7 x 7 x 7 mm, with internal calcifications. On a subsequent thyroid U/S >> this nodule has disappeared.  A technetium sestamibi scan showed a left inferior parathyroid adenoma and a CT scan confirmed this. However, during surgery, it was found that she had a left superior parathyroid adenoma, which was excised - left superior parathyroidectomy with Dr Gerrit Friends in 02/2013.   She feels much better after her PTH and calcium normalized.  Her PTH and calcium levels normalized after surgery: Lab Results  Component Value Date   PTH 35 11/15/2015   PTH Comment 11/15/2015   PTH 41 11/15/2014   PTH Comment 11/15/2014   PTH 44 03/16/2014   PTH Comment 03/16/2014   CALCIUM 9.7 03/06/2018   CALCIUM 9.3 11/15/2017   CALCIUM 9.8 02/13/2017   CALCIUM 9.3 11/15/2016   CALCIUM 9.7 11/15/2015   CALCIUM 9.5 07/18/2015   CALCIUM 9.5 11/15/2014   CALCIUM 9.4 09/24/2014   CALCIUM 8.8 03/16/2014   CALCIUM 9.3 09/03/2013   CALCIUM 9.2 04/20/2013   CALCIUM 9.1 03/16/2013   CALCIUM 9.1 02/27/2013   CALCIUM 10.7 (H) 02/10/2013   CALCIUM 10.2 10/20/2012   No history of kidney stones but had an osteoporotic T score at the ultra distal radius per DEXA scan in 2014.  No fractures  Latest bone density scan ([facility]) on 11/28/2016 showed osteopenia: - L1-L4: -2.2 - RFN: -1.1 - LFN: -1.3 FRAX: Major osteoporotic fracture risk over 10 years: 13.7; hip fracture risk over 10 years: 2.1. But unfortunately appendicular skeleton was not  checked..  H/o vit D def  Previously on ergocalciferol, now on vitamin D 4000 units daily, increased at last OV  Her vitamin D was normal: Lab Results  Component Value Date   VD25OH 25.82 (L) 11/15/2017   VD25OH 43.09 11/15/2016   VD25OH 45.13 11/15/2015   VD25OH 34.16 07/18/2015   VD25OH 30.52 10/12/2014   VD25OH 19.76 (L) 03/16/2014   VD25OH 35 09/03/2013   VD25OH 35 11/04/2012   VD25OH 59 12/27/2011   VD25OH 48 03/30/2011   Hypothyroidism.  -Longstanding  Pt is on levothyroxine Synthroid d.a.w. 100 mcg 6/7, 50 mcg 1/7 days await, taken: - in am - fasting - at least 30 min from b'fast - no Ca, Fe, MVI - + PPIs later in the day - not on Biotin  Reviewed her TFTs and they are normal: Lab Results  Component Value Date   TSH 0.77 05/19/2018   TSH 0.99 11/15/2017   TSH 1.13 11/15/2016   TSH 1.84 07/18/2015   TSH 1.27 11/15/2014   FREET4 0.97 11/15/2017   FREET4 0.88 11/15/2016   FREET4 0.92 11/15/2014   FREET4 0.77 09/21/2014   FREET4 0.95 07/13/2014   Pt denies: - feeling nodules in neck - hoarseness - dysphagia - choking - SOB with lying down  Review of Systems Constitutional: no weight gain/no weight loss, + fatigue, + waking up not refreshed, no subjective hyperthermia, no subjective hypothermia Eyes: no blurry vision, no xerophthalmia ENT: no sore throat, +  see HPI Cardiovascular: no CP/no SOB/no palpitations/no leg swelling Respiratory: no cough/no SOB/no wheezing Gastrointestinal: no N/no V/no D/no C/no acid reflux Musculoskeletal: no muscle aches/no joint aches Skin: no rashes, no hair loss Neurological: no tremors/no numbness/no tingling/no dizziness  I reviewed pt's medications, allergies, PMH, social hx, family hx, and changes were documented in the history of present illness. Otherwise, unchanged from my initial visit note.  Past Medical History:  Diagnosis Date  . Abnormal EKG    per patient- states evaluated by Dr Daleen SquibbWall in the past  .  Allergy    sneezing  late Fall  . Anemia 01/23/12   H/H 11.7/35  . Anxiety state, unspecified   . Arthritis   . Benign paroxysmal positional vertigo   . Depression   . Depressive disorder, not elsewhere classified   . Diverticulosis   . Esophageal reflux   . Esophageal stricture   . Gallstones   . Heart murmur   . Hiatal hernia   . History of hyperparathyroidism   . History of UTI   . HTN (hypertension)   . Hyperlipidemia   . OSA (obstructive sleep apnea) 05/27/2018  . Other abnormal glucose   . Personal history of other diseases of digestive system    12-15 since 1990, no recurrence 2006  . PONV (postoperative nausea and vomiting)   . Unspecified hypothyroidism   . Vitamin D deficiency    Past Surgical History:  Procedure Laterality Date  . CESAREAN SECTION  1977  . CHOLECYSTECTOMY  2004  . COLONOSCOPY  12/2003   Tics  . DENTAL SURGERY  1962, 2012   Age 61-Abscessed Teeth Extracted   . PARATHYROIDECTOMY Left 02/13/2013   Procedure: LEFT INFERIOR PARATHYROIDECTOMY with frozen section ;  Surgeon: Velora Hecklerodd M Gerkin, MD;  Location: [facility];  Service: General;  Laterality: Left;  . TOTAL ABDOMINAL HYSTERECTOMY W/ BILATERAL SALPINGOOPHORECTOMY  2005   Ovarian Cyst   Social History   Socioeconomic History  . Marital status: Married    Spouse name: Not on file  . Number of children: 1  . Years of education: Not on file  . Highest education level: Not on file  Occupational History  . Occupation: [employer]    Employer: RETIRED  . Occupation: retired Visual merchandiserclerical worker  Social Needs  . Financial resource strain: Not on file  . Food insecurity    Worry: Not on file    Inability: Not on file  . Transportation needs    Medical: Not on file    Non-medical: Not on file  Tobacco Use  . Smoking status: Former Smoker    Quit date: 05/07/1973    Years since quitting: 45.5  . Smokeless tobacco: Never Used  . Tobacco comment: Smoked 1968-1975 up to 2 ppd  Substance and  Sexual Activity  . Alcohol use: Yes    Alcohol/week: 14.0 standard drinks    Types: 14 Standard drinks or equivalent per week    Comment: 2 nightly wines  . Drug use: No  . Sexual activity: Yes  Lifestyle  . Physical activity    Days per week: Not on file    Minutes per session: Not on file  . Stress: Not on file  Relationships  . Social Musicianconnections    Talks on phone: Not on file    Gets together: Not on file    Attends religious service: Not on file    Active member of club or organization: Not on file    Attends  meetings of clubs or organizations: Not on file    Relationship status: Not on file  . Intimate partner violence    Fear of current or ex partner: Not on file    Emotionally abused: Not on file    Physically abused: Not on file    Forced sexual activity: Not on file  Other Topics Concern  . Not on file  Social History Narrative   Regular exercise: no   Caffeine use: daily   Current Outpatient Medications on File Prior to Visit  Medication Sig Dispense Refill  . ALPRAZolam (XANAX) 0.5 MG tablet Take 0.5 mg by mouth 3 (three) times daily as needed for anxiety.     . Cholecalciferol (VITAMIN D3) 2000 UNITS TABS Take 2 tablets by mouth daily.     . cyanocobalamin 100 MCG tablet Take 100 mcg by mouth daily.    . fexofenadine (ALLEGRA) 180 MG tablet Take 180 mg by mouth as needed for allergies. Reported on 07/18/2015    . FLUoxetine (PROZAC) 40 MG capsule Take 40 mg by mouth every morning.     . fluticasone (FLONASE) 50 MCG/ACT nasal spray Place 1 spray into both nostrils daily. 16 g 2  . hydrochlorothiazide (MICROZIDE) 12.5 MG capsule Take 1 capsule (12.5 mg total) by mouth daily. 90 capsule 2  . losartan (COZAAR) 25 MG tablet TAKE 2 TABLETS BY MOUTH EVERY DAY (Patient not taking: Reported on 11/13/2018) 60 tablet 11  . losartan (COZAAR) 50 MG tablet Take 1 tablet (50 mg total) by mouth daily. 90 tablet 3  . omeprazole (PRILOSEC) 20 MG capsule Take 1 capsule (20 mg total)  by mouth daily. 90 capsule 3  . SYNTHROID 100 MCG tablet TAKE ONE TABLET DAILY AND TAKE ONE-HALF (1/2) TABLET ON SUNDAY, 90 tablet 3   No current facility-administered medications on file prior to visit.    Allergies  Allergen Reactions  . Augmentin [Amoxicillin-Pot Clavulanate] Rash   Family History  Problem Relation Age of Onset  . Coronary artery disease Father   . Heart attack Father 33  . COPD Father   . Diabetes Mother   . Dysrhythmia Mother   . Depression Mother   . COPD Mother   . Colon polyps Mother   . Kidney disease Mother   . Prostate cancer Brother   . Other Brother        Wegener's in sinus; [place]  . Heart disease Brother   . Arthritis Brother   . Hypertension Brother   . Prostate cancer Brother   . Cancer Brother        Soft tissue in the face/temple  . Stroke Maternal Aunt        >65  . Stroke Maternal Grandmother        >65  . Stroke Maternal Grandfather 47    Objective:   Physical Exam There were no vitals taken for this visit. There is no height or weight on file to calculate BMI. Wt Readings from Last 3 Encounters:  11/06/18 209 lb (94.8 kg)  10/30/18 209 lb (94.8 kg)  10/28/18 209 lb (94.8 kg)   Constitutional: overweight, in NAD Eyes: PERRLA, EOMI, no exophthalmos ENT: moist mucous membranes, no thyromegaly, no cervical lymphadenopathy Cardiovascular: RRR, No MRG Respiratory: CTA B Gastrointestinal: abdomen soft, NT, ND, BS+ Musculoskeletal: no deformities, strength intact in all 4 Skin: moist, warm, no rashes Neurological: no tremor with outstretched hands, DTR normal in all 4  Assessment:     1. Primary hyperparathyroidism -  status post left superior parathyroidectomy 02/13/2013, by Dr. Gerrit FriendsGerkin - calcium < 1 mg/dl above normal before the surgery - elevated PTH (133, 238) - normal vit D  - elevated 1,25 vitamin D - no renal failure - osteopenia  - no h/o kidney stones - DXA 01/23/2010 ([facility]):  L1-4 T score -2.0  Left femoral  neck T score -1.9  Right femoral neck T score -1.4  10 year any fracture risk 13.8%, 10 year hip fracture risk 2.9% - CT neck 01/14/2013:    Focal nodular appearance along the posterior aspect of the left lobe of thyroid corresponds with a sestamibi exam and suggests a left lower parathyroid adenoma.   No significant nodular tissue adjacent to the submandibular glands.   Sub centimeter lymph nodes are likely within normal limits.  Focal calcification is noted within the right lobe of the thyroid.  - Tc sestamibi scan 12/29/2012: suspicious for left thyroid lobe region parathyroid  adenoma. Questionable abnormal focus also in the right submandibular space - DXA 11/14/2012 ([facility]):  L1-4 T score -2.0 >> -2.1 (but high SD, L1 T score is -2.8)  Left femoral neck T score -1.9 >> -1.9  Right femoral neck T score -1.4 >> -1.7  33% distal radius: -1.5  But worrisome that UD radius -3.1 (good evaluation for trabecular bone) - UCa 300 mg/24h (11/06/2012)   She had parathyroidectomy with Dr Gerrit FriendsGerkin on 02/13/2013.   - DXA 11/28/2016 ([facility]):  - L1-L4: -2.2  - RFN: -1.1  - LFN: -1.3 FRAX: Major osteoporotic fracture risk over 10 years: 13.7; hip fracture risk over 10 years: 2.1.  2. Thyroid nodule - thyroid ultrasound 10/08/2012: Thyroid echotexture is heterogeneous.   Right lobe: Measures 3 x 1.2 x 1.1 cm.   Left lobe: Measures 2.9 x 1.9 x 0.9 cm.   Isthmus: Measures 0.4 cm.   Focal lesions: Within the lower pole of the right lobe of thyroid gland there is a hypoechoic nodule containing coarsened calcifications. This measures 7 x 7 x 7 mm.   No lymphadenopathy identified. - thyroid U/S 03/22/2014:  Right thyroid lobe: 3.4 x 1.1 x 1.0 cm. Stable to slightly smaller focal area of nodularity with dystrophic shadowing calcifications measures approximately 0.6 cm in diameter. The thyroid parenchyma is very heterogeneous and shows no abnormal vascularity.  Left  thyroid lobe: 2.9 x 1.1 x 1.1 cm. No nodules visualized. Heterogeneous parenchyma noted without abnormal vascularity. The left lobe is small in size.  Isthmus Thickness: 0.9 cm. No nodules visualized.  Lymphadenopathy None visualized.  3. Hypothyroidism   4. vitamin D deficiency  5. OP  Plan:     1. Primary Hyperparathyroidism: -Patient with history of primary hyperparathyroidism, with minimally elevated calcium but high PTH levels in the past.  She is now status post parathyroidectomy in 02/2013 by Dr. Gerrit FriendsGerkin.  At that time, she had resection of a 0.5 g adenoma.  Subsequent calcium and PTH levels were normal. Lab Results  Component Value Date   CALCIUM 9.7 03/06/2018   CALCIUM 9.3 11/15/2017  -Recheck her calcium today  2. R thyroid nodule -She had a right 7 mm thyroid nodule with internal calcification which was stable and even smaller on the latest thyroid ultrasound -No neck compression symptoms -No follow-up ultrasound needed unless she becomes symptomatic.  We will follow her clinically.  3. Hypothyroidism - latest thyroid labs reviewed with pt >> normal at last visit - she continues on LT4 100 mcg 6/7 days and 50 mcg  1/7 days - pt feels good on this dose. - we discussed about taking the thyroid hormone every day, with water, >30 minutes before breakfast, separated by >4 hours from acid reflux medications, calcium, iron, multivitamins. Pt. is taking it correctly. - will check thyroid tests today: TSH and fT4 - If labs are abnormal, she will need to return for repeat TFTs in 1.5 months  4. Vit D deficiency -At last visit we increased her vitamin D daily dose from 2000 to 4000 units daily latest vitamin D was low at 25.8 -We will recheck the level today  5.  Osteoporosis  -Reviewed previous DXA scan reports.  She had osteoporosis at the radius before her parathyroid surgery.  The latest bone density scan reports from 2018 showed an improved set of T-scores, however,  these were checked on another DEXA machine -Unfortunately, she had a right styloid radial fracture after a fall in 10/2018 -At this visit, we will repeat her DXA scan on the same machine as her last scan -We discussed about the possibility of starting antiresorptive medicines after the results are back. We discussed about the main med classes and mech. Of action. She agrees to try Prolia if needed. No dental work coming up or in progress.  Needs refills.  Component     Latest Ref Rng & Units 11/20/2018  Glucose     65 - 99 mg/dL 90  BUN     7 - 25 mg/dL 9  Creatinine     4.090.60 - 0.93 mg/dL 8.110.76  GFR, Est Non African American     > OR = 60 mL/min/1.6073m2 79  GFR, Est African American     > OR = 60 mL/min/1.2673m2 91  BUN/Creatinine Ratio     6 - 22 (calc) NOT APPLICABLE  Sodium     135 - 146 mmol/L 138  Potassium     3.5 - 5.3 mmol/L 4.2  Chloride     98 - 110 mmol/L 106  CO2     20 - 32 mmol/L 22  Calcium     8.6 - 10.4 mg/dL 9.4  TSH     9.140.35 - 7.824.50 uIU/mL 1.19  T4,Free(Direct)     0.60 - 1.60 ng/dL 9.561.04  VITD     21.3030.00 - 100.00 ng/mL 62.76   All labs are normal.  Carlus Pavlovristina Ramell Wacha, MD PhD [facility]

## 2018-11-20 NOTE — Patient Instructions (Signed)
Please stop at the lab.  Continue Vitamin D 4000 units daily.  Continue Levothyroxine 100 mcg 6/7 days and 50 mcg 1/7 days.  Take the thyroid hormone every day, with water, >30 minutes before breakfast, separated by >4 hours from acid reflux medications, calcium, iron, multivitamins.  Please return in 1 year.

## 2018-11-21 LAB — BASIC METABOLIC PANEL WITH GFR
BUN: 9 mg/dL (ref 7–25)
CO2: 22 mmol/L (ref 20–32)
Calcium: 9.4 mg/dL (ref 8.6–10.4)
Chloride: 106 mmol/L (ref 98–110)
Creat: 0.76 mg/dL (ref 0.60–0.93)
GFR, Est African American: 91 mL/min/{1.73_m2} (ref >=60)
GFR, Est Non African American: 79 mL/min/{1.73_m2} (ref >=60)
Glucose, Bld: 90 mg/dL (ref 65–99)
Potassium: 4.2 mmol/L (ref 3.5–5.3)
Sodium: 138 mmol/L (ref 135–146)

## 2018-11-21 MED ORDER — SYNTHROID 100 MCG PO TABS: ORAL_TABLET | ORAL | 3 refills | Status: DC

## 2018-11-25 ENCOUNTER — Other Ambulatory Visit: Payer: Self-pay

## 2018-11-25 ENCOUNTER — Encounter: Payer: Self-pay | Admitting: Physical Therapy

## 2018-11-25 ENCOUNTER — Ambulatory Visit: Payer: Medicare Other | Admitting: Physical Therapy

## 2018-11-25 DIAGNOSIS — M25531 Pain in right wrist: Secondary | ICD-10-CM | POA: Diagnosis not present

## 2018-11-25 DIAGNOSIS — R600 Localized edema: Secondary | ICD-10-CM

## 2018-11-25 DIAGNOSIS — M6281 Muscle weakness (generalized): Secondary | ICD-10-CM

## 2018-11-25 DIAGNOSIS — M25631 Stiffness of right wrist, not elsewhere classified: Secondary | ICD-10-CM

## 2018-11-25 NOTE — Therapy (Signed)
[facility] [address]  [place], [place], [address] Phone: [phone]   Fax:  [phone]  Physical Therapy Treatment  Patient Details  Name: Gail Fuentes MRN: [id number] Date of Birth: [birth year] Referring Provider (PT): Clearance Coots, MD   Encounter Date: 11/25/2018  PT End of Session - 11/25/18 1211    Visit Number  3    Number of Visits  5    Date for PT Re-Evaluation  12/11/18    Authorization Type  [employer] Medicare    PT Start Time  1017    PT Stop Time  1101    PT Time Calculation (min)  44 min    Activity Tolerance  Patient tolerated treatment well;Patient limited by pain    Behavior During Therapy  [facility] for tasks assessed/performed       Past Medical History:  Diagnosis Date  . Abnormal EKG    per patient- states evaluated by Dr Verl Blalock in the past  . Allergy    sneezing  late Fall  . Anemia 01/23/12   H/H 11.7/35  . Anxiety state, unspecified   . Arthritis   . Benign paroxysmal positional vertigo   . Depression   . Depressive disorder, not elsewhere classified   . Diverticulosis   . Esophageal reflux   . Esophageal stricture   . Gallstones   . Heart murmur   . Hiatal hernia   . History of hyperparathyroidism   . History of UTI   . HTN (hypertension)   . Hyperlipidemia   . OSA (obstructive sleep apnea) 05/27/2018  . Other abnormal glucose   . Personal history of other diseases of digestive system    12-15 since 1990, no recurrence 2006  . PONV (postoperative nausea and vomiting)   . Unspecified hypothyroidism   . Vitamin D deficiency     Past Surgical History:  Procedure Laterality Date  . [place]  . CHOLECYSTECTOMY  2004  . COLONOSCOPY  12/2003   Tics  . DENTAL SURGERY  1962, 2012   Age 73-Abscessed Teeth Extracted   . PARATHYROIDECTOMY Left 02/13/2013   Procedure: LEFT INFERIOR PARATHYROIDECTOMY with frozen section ;  Surgeon: Earnstine Regal, MD;  Location: [facility];  Service: General;  Laterality: Left;  . TOTAL ABDOMINAL HYSTERECTOMY W/ BILATERAL SALPINGOOPHORECTOMY  2005   Ovarian Cyst    There were no vitals filed for this visit.  Subjective Assessment - 11/25/18 1017    Subjective  Saturday went to the grocery store and found herself pushing the cart with her R elbow. Feels like she might have pulled something else in her R wrist. Has noticed some more swelling in the wrist. Still wearing wrist brace. Also having "a little tired feeling" in her R shoulder.    Pertinent History  anemia, anxiety, BPPV, depression, hiatal hernia, HTN, HLD, hypothyroidism    Diagnostic tests  11/06/18 R wrist xray: Stable appearance of the nondisplaced intra-articular fracture of the radial styloid    Patient Stated Goals  "to feel that this is healed and to return to my normal activities"    Currently in Pain?  Yes    Pain Score  1     Pain Location  Wrist    Pain Orientation  Right    Pain Descriptors / Indicators  Dull    Pain Type  Acute pain         [facility] PT Assessment - 11/25/18 0001  AROM   Right Wrist Extension  58 Degrees    Right Wrist Flexion  60 Degrees    Right Wrist Radial Deviation  15 Degrees   mild pain   Right Wrist Ulnar Deviation  30 Degrees   mild pain     Strength   Right Shoulder Flexion  4+/5    Right Shoulder ABduction  4+/5    Right Shoulder Internal Rotation  4/5    Right Shoulder External Rotation  4/5    Right Elbow Flexion  4+/5    Right Elbow Extension  4+/5    Right Wrist Flexion  4/5    Right Wrist Extension  4/5    Right Wrist Radial Deviation  3+/5    Right Wrist Ulnar Deviation  4-/5    Right Hand Grip (lbs)  14.3   3, 15, 25   Right Hand Lateral Pinch  12.7 lbs   13, 13, 12                  [facility] Adult PT Treatment/Exercise - 11/25/18 0001      Exercises   Exercises  Shoulder      Shoulder Exercises: Seated   Flexion  Strengthening;Right;10 reps;Weights    Flexion Weight (lbs)  1     Flexion Limitations  scaption with 1# ankle wt around forearm   cues to avoid shoulder hike   Abduction  Strengthening;Right;10 reps;Weights    ABduction Weight (lbs)  1    ABduction Limitations  with 1# ankle wt around forearm   cues to avoid shoulder hiking     Shoulder Exercises: Sidelying   External Rotation  Strengthening;Right;10 reps    External Rotation Limitations  with elbow at torso   cues for slight scap squeeze     Shoulder Exercises: Stretch   Elbow Flexion  Strengthening;Right;10 reps;Seated;Limitations   bicep curl w/ ankle wt around forearm; 10x 1#, 10x 2#     Wrist Exercises   Wrist Flexion  AROM;Right;10 reps;Seated    Wrist Flexion Limitations  elbow supported on table    Wrist Extension  AROM;Right;10 reps;Seated    Wrist Extension Limitations  elbow supported on table    Wrist Radial Deviation  Right;10 reps;AROM    Wrist Radial Deviation Limitations  palm flat on table    Wrist Ulnar Deviation  Right;10 reps;AROM    Wrist Ulnar Deviation Limitations  palm flat on table      Manual Therapy   Manual Therapy  Passive ROM    Manual therapy comments  sitting    Passive ROM  R wrist PROM in all directions to tolerance- mild discomfort at end range ulnar deviation             PT Education - 11/25/18 1223    Education Details  update to HEP    Person(s) Educated  Patient    Methods  Explanation;Demonstration;Tactile cues;Verbal cues;Handout    Comprehension  Verbalized understanding;Returned demonstration       PT Short Term Goals - 11/25/18 1023      PT SHORT TERM GOAL #1   Title  Patient to be independent with initial HEP.    Time  2    Period  Weeks    Status  Achieved    Target Date  11/27/18        PT Long Term Goals - 11/25/18 1024      PT LONG TERM GOAL #1   Title  Patient to be independent with   advanced HEP.    Time  4    Period  Weeks    Status  On-going   met for current     PT LONG TERM GOAL #2   Title  Patient to  demonstrate B UE strength >=4+/5.    Time  4    Period  Weeks    Status  On-going   able to tolerate light resistance to wrist with most weakness in ulnar and radial deviation     PT LONG TERM GOAL #3   Title  Patient to demonstrate R wrist AROM symmetrical to opposite side and nonpainful.    Time  4    Period  Weeks    Status  Partially Met   improvements in R wrist flexion, extension, ulnar deviation AROM     PT LONG TERM GOAL #4   Title  Patient to demonstrate R hand grip strength >=20 lbs.    Time  4    Period  Weeks    Status  On-going   averaged 14.3 lbs on R hand           Plan - 11/25/18 1223    Clinical Impression Statement  Patient reporting slight increase in pain and edema in R wrist since last weekend. Continues to wear wrist brace at all times. MD instructed to D/C brace today, thus reminded patient of these instructions. Swelling present in R radial aspect of wrist at beginning of appointment, but no worse than previously observed. Patient has demonstrated improvements in R wrist flexion, extension, ulnar deviation AROM; most limited in radial deviation as expected. Able to tolerate light resistance to wrist with strength testing, with most weakness in ulnar and radial deviation. Grip strength is improving dramatically, with patient averaging 14.3 lbs on R hand today compared to 0 lbs at initial eval. Patient tolerated gentle R wrist PROM with most discomfort with ulnar deviation. Worked on proximal joint strengthening with good tolerance by patient. Updated HEP with sidelying shoulder ER as this muscle group is still limited. Patient reported understanding.    Comorbidities  ostepenia, anemia, anxiety, BPPV, depression, hiatal hernia, HTN, HLD, hypothyroidism    PT Treatment/Interventions  ADLs/Self Care Home Management;Cryotherapy;Electrical Stimulation;Moist Heat;Ultrasound;Functional mobility training;Therapeutic activities;Therapeutic exercise;Manual  techniques;Patient/family education;Neuromuscular re-education;Passive range of motion;Dry needling;Energy conservation;Splinting;Taping;Vasopneumatic Device    PT Next Visit Plan  progress ROM; UE strengthening activities per pt. tolernce; modalities prn    Consulted and Agree with Plan of Care  Patient       Patient will benefit from skilled therapeutic intervention in order to improve the following deficits and impairments:  Hypomobility, Increased edema, Decreased activity tolerance, Decreased strength, Impaired UE functional use, Pain, Decreased range of motion, Postural dysfunction, Impaired flexibility  Visit Diagnosis: 1. Pain in right wrist   2. Stiffness of right wrist, not elsewhere classified   3. Muscle weakness (generalized)   4. Localized edema        Problem List Patient Active Problem List   Diagnosis Date Noted  . Closed nondisplaced fracture of styloid process of right radius 10/30/2018  . OSA (obstructive sleep apnea) 05/27/2018  . History of hyperparathyroidism 08/24/2014  . Obesity (BMI 30-39.9) 10/08/2013  . HTN (hypertension) 07/13/2013  . Elevated blood pressure reading without diagnosis of hypertension 06/29/2013  . Osteoporosis 11/14/2012  . Acute iritis of both eyes 10/17/2012  . Thyroid nodule 10/17/2012  . Cardiomegaly 02/20/2012  . DISTURBANCES OF SENSATION OF SMELL AND TASTE 04/19/2010  . Vitamin D deficiency 12/24/2008  .  ANXIETY 12/24/2008  . DEPRESSION 12/24/2008  . BENIGN PAROXYSMAL POSITIONAL VERTIGO 12/24/2008  . HYPERGLYCEMIA 12/24/2008  . Nonspecific abnormal electrocardiogram (ECG) (EKG) 12/24/2008  . Hyperlipidemia 07/23/2007  . Hypothyroidism 07/22/2007  . GERD 07/22/2007  . DIVERTICULITIS, HX OF 07/22/2007     Janene Harvey, PT, DPT 11/25/18 12:25 PM   [facility] [address]  [place], [place], [address] Phone: [phone]   Fax:  [phone]  Name:  SHANIQUA GUILLOT MRN: [id number] Date of Birth: [birth year]

## 2018-11-26 ENCOUNTER — Ambulatory Visit: Payer: Self-pay | Admitting: *Deleted

## 2018-11-26 NOTE — Progress Notes (Addendum)
Virtual Visit via Video Note  I connected with patient on 11/27/18 at  3:00 PM EDT by a video enabled telemedicine application and verified that I am speaking with the correct person using two identifiers.   THIS ENCOUNTER IS A VIRTUAL VISIT DUE TO COVID-19 - PATIENT WAS NOT SEEN IN THE OFFICE. PATIENT HAS CONSENTED TO VIRTUAL VISIT / TELEMEDICINE VISIT   Location of patient: home  Location of provider: office  I discussed the limitations of evaluation and management by telemedicine and the availability of in person appointments. The patient expressed understanding and agreed to proceed.   Subjective:   Gail Fuentes is a 73 y.o. female who presents for Medicare Annual (Subsequent) preventive examination.  Review of Systems: No ROS.  Medicare Wellness Virtual Visit.  Visual/audio telehealth visit, UTA vital signs.   See social history for additional risk factors. Cardiac Risk Factors include: advanced age (>64men, >69 women);dyslipidemia;hypertension;obesity (BMI >30kg/m2) Sleep patterns: varies Home Safety/Smoke Alarms: Feels safe in home. Smoke alarms in place.   Lives with husband in 2 story home. Master on  1st. Walk-in shower. No issues with navigating stairs.  Female:     Mammo- pt states she will do later in the fall     Dexa scan- active order       CCS- due 11/2024     Objective:    Advanced Directives 11/27/2018 11/13/2018 11/22/2017 11/21/2016 11/19/2014 02/10/2013  Does Patient Have a Medical Advance Directive? Yes Yes Yes Yes Yes Patient has advance directive, copy not in chart  Type of Advance Directive Healthcare Power of [place];Living will - Healthcare Power of [place];Living will Healthcare Power of [place];Living will Healthcare Power of [place];Living will Healthcare Power of [place];Living will  Does patient want to make changes to medical advance directive? No - Patient declined No - Patient declined - - - -  Copy of Healthcare Power of Attorney in  Chart? No - copy requested - No - copy requested No - copy requested - Copy requested from family    Tobacco Social History   Tobacco Use  Smoking Status Former Smoker  . Quit date: 05/07/1973  . Years since quitting: 45.5  Smokeless Tobacco Never Used  Tobacco Comment   Smoked [phone] up to 2 ppd     Counseling given: Not Answered Comment: Smoked [phone] up to 2 ppd   Clinical Intake:  Pain : No/denies pain    Past Medical History:  Diagnosis Date  . Abnormal EKG    per patient- states evaluated by Dr Daleen Squibb in the past  . Allergy    sneezing  late Fall  . Anemia 01/23/12   H/H 11.7/35  . Anxiety state, unspecified   . Arthritis   . Benign paroxysmal positional vertigo   . Depression   . Depressive disorder, not elsewhere classified   . Diverticulosis   . Esophageal reflux   . Esophageal stricture   . Gallstones   . Heart murmur   . Hiatal hernia   . History of hyperparathyroidism   . History of UTI   . HTN (hypertension)   . Hyperlipidemia   . OSA (obstructive sleep apnea) 05/27/2018  . Other abnormal glucose   . Personal history of other diseases of digestive system    12-15 since 1990, no recurrence 2006  . PONV (postoperative nausea and vomiting)   . Unspecified hypothyroidism   . Vitamin D deficiency    Past Surgical History:  Procedure Laterality Date  . CESAREAN SECTION  1977  .  CHOLECYSTECTOMY  2004  . COLONOSCOPY  12/2003   Tics  . DENTAL SURGERY  1962, 2012   Age 36-Abscessed Teeth Extracted   . PARATHYROIDECTOMY Left 02/13/2013   Procedure: LEFT INFERIOR PARATHYROIDECTOMY with frozen section ;  Surgeon: Velora Hecklerodd M Gerkin, MD;  Location: [facility];  Service: General;  Laterality: Left;  . TOTAL ABDOMINAL HYSTERECTOMY W/ BILATERAL SALPINGOOPHORECTOMY  2005   Ovarian Cyst   Family History  Problem Relation Age of Onset  . Coronary artery disease Father   . Heart attack Father 1865  . COPD Father   . Diabetes Mother   . Dysrhythmia Mother   .  Depression Mother   . COPD Mother   . Colon polyps Mother   . Kidney disease Mother   . Prostate cancer Brother   . Other Brother        Wegener's in sinus; [facility]  . Heart disease Brother   . Arthritis Brother   . Hypertension Brother   . Prostate cancer Brother   . Cancer Brother        Soft tissue in the face/temple  . Stroke Maternal Aunt        >65  . Stroke Maternal Grandmother        >65  . Stroke Maternal Grandfather [id number]   Social History   Socioeconomic History  . Marital status: Married    Spouse name: Not on file  . Number of children: 1  . Years of education: Not on file  . Highest education level: Not on file  Occupational History  . Occupation: [employer]    Employer: RETIRED  . Occupation: retired Visual merchandiserclerical worker  Social Needs  . Financial resource strain: Not on file  . Food insecurity    Worry: Not on file    Inability: Not on file  . Transportation needs    Medical: Not on file    Non-medical: Not on file  Tobacco Use  . Smoking status: Former Smoker    Quit date: 05/07/1973    Years since quitting: 45.5  . Smokeless tobacco: Never Used  . Tobacco comment: Smoked 1968-1975 up to 2 ppd  Substance and Sexual Activity  . Alcohol use: Yes    Alcohol/week: 14.0 standard drinks    Types: 14 Standard drinks or equivalent per week    Comment: 2 nightly wines  . Drug use: No  . Sexual activity: Not Currently  Lifestyle  . Physical activity    Days per week: Not on file    Minutes per session: Not on file  . Stress: Not on file  Relationships  . Social Musicianconnections    Talks on phone: Not on file    Gets together: Not on file    Attends religious service: Not on file    Active member of club or organization: Not on file    Attends meetings of clubs or organizations: Not on file    Relationship status: Not on file  Other Topics Concern  . Not on file  Social History Narrative   Regular exercise: no   Caffeine use: daily     Outpatient Encounter Medications as of 11/27/2018  Medication Sig  . ALPRAZolam (XANAX) 0.5 MG tablet Take 0.5 mg by mouth 3 (three) times daily as needed for anxiety.   . Cholecalciferol (VITAMIN D3) 2000 UNITS TABS Take 2 tablets by mouth daily.   . cyanocobalamin 100 MCG tablet Take 100 mcg by mouth daily.  . fexofenadine (ALLEGRA) 180  MG tablet Take 180 mg by mouth as needed for allergies. Reported on 07/18/2015  . FLUoxetine (PROZAC) 40 MG capsule Take 40 mg by mouth every morning.   . fluticasone (FLONASE) 50 MCG/ACT nasal spray Place 1 spray into both nostrils daily.  . hydrochlorothiazide (MICROZIDE) 12.5 MG capsule Take 1 capsule (12.5 mg total) by mouth daily.  [name] losartan (COZAAR) 25 MG tablet TAKE 2 TABLETS BY MOUTH EVERY DAY  . omeprazole (PRILOSEC) 20 MG capsule Take 1 capsule (20 mg total) by mouth daily.  [name] SYNTHROID 100 MCG tablet TAKE ONE TABLET DAILY AND TAKE ONE-HALF (1/2) TABLET ON SUNDAY,   No facility-administered encounter medications on file as of 11/27/2018.     Activities of Daily Living In your present state of health, do you have any difficulty performing the following activities: 11/27/2018  Hearing? N  Vision? N  Difficulty concentrating or making decisions? N  Walking or climbing stairs? N  Dressing or bathing? N  Doing errands, shopping? N  Preparing Food and eating ? N  Using the Toilet? N  In the past six months, have you accidently leaked urine? N  Do you have problems with loss of bowel control? N  Managing your Medications? N  Managing your Finances? N  Housekeeping or managing your Housekeeping? N  Some recent data might be hidden    Patient Care Team: Copland, Gay Filler, MD as PCP - General (Family Medicine) Burnell Blanks, MD as Consulting Physician (Cardiology) Philemon Kingdom, MD as Consulting Physician (Internal Medicine) [name], MD as Consulting Physician (Obstetrics and Gynecology) Melissa Noon, [place] as Referring  Physician (Optometry)    Assessment:   This is a routine wellness examination for Gail Fuentes. Physical assessment deferred to PCP.  Exercise Activities and Dietary recommendations Current Exercise Habits: The patient does not participate in regular exercise at present, Exercise limited by: None identified Diet (meal preparation, eat out, water intake, caffeinated beverages, dairy products, fruits and vegetables): in general, an "unhealthy" diet      Goals    . DIET - EAT MORE FRUITS AND VEGETABLES    . Increase physical activity    . To be out and more social.    . Would like to [place] a group of female friends       Fall Risk Fall Risk  11/27/2018 11/22/2017 11/21/2016 11/21/2016 11/17/2015  Falls in the past year? 1 No No No No  Number falls in past yr: 0 - - - -  Injury with Fall? 1 - - - -    Depression Screen PHQ 2/9 Scores 11/27/2018 11/22/2017 11/21/2016 11/21/2016  PHQ - 2 Score 1 1 0 0  PHQ- 9 Score - - - -     Cognitive Function Ad8 score reviewed for issues:  Issues making decisions:no  Less interest in hobbies / activities:no  Repeats questions, stories (family complaining):no  Trouble using ordinary gadgets (microwave, computer, phone):no  Forgets the month or year:  no  Mismanaging finances: no  Remembering appts:no  Daily problems with thinking and/or memory:no Ad8 score is=0   MMSE - Mini Mental State Exam 11/22/2017  Orientation to time 5  Orientation to Place 5  Registration 3  Attention/ Calculation 5  Recall 3  Language- name 2 objects 2  Language- repeat 1  Language- follow 3 step command 3  Language- read & follow direction 1  Write a sentence 1  Copy design 1  Total score 30        Immunization  History  Administered Date(s) Administered  . Influenza Split 03/11/2012, 02/22/2017  . Influenza Whole 02/20/2008, 03/06/2010  . Influenza, High Dose Seasonal PF 03/17/2013, 02/22/2017, 01/15/2018  . Influenza,inj,Quad PF,6+ Mos 03/04/2014   . Influenza-Unspecified 03/08/2015, 03/06/2016, 01/27/2018  . Pneumococcal Conjugate-13 07/18/2015  . Pneumococcal Polysaccharide-23 03/06/2013  . Td 07/18/2015    Screening Tests Health Maintenance  Topic Date Due  . INFLUENZA VACCINE  12/06/2018  . MAMMOGRAM  02/19/2020  . COLONOSCOPY  11/18/2024  . TETANUS/TDAP  07/17/2025  . DEXA SCAN  Completed  . Hepatitis C Screening  Completed  . PNA vac Low Risk Adult  Completed      Plan:    Please schedule your next medicare wellness visit with me in 1 yr.  Continue to eat heart healthy diet (full of fruits, vegetables, whole grains, lean protein, water--limit salt, fat, and sugar intake) and increase physical activity as tolerated.  Continue doing brain stimulating activities (puzzles, reading, adult coloring books, staying active) to keep memory sharp.   Bring a copy of your living will and/or healthcare power of attorney to your next office visit.   I have personally reviewed and noted the following in the patient's chart:   . Medical and social history . Use of alcohol, tobacco or illicit drugs  . Current medications and supplements . Functional ability and status . Nutritional status . Physical activity . Advanced directives . List of other physicians . Hospitalizations, surgeries, and ER visits in previous 12 months . Vitals . Screenings to include cognitive, depression, and falls . Referrals and appointments  In addition, I have reviewed and discussed with patient certain preventive protocols, quality metrics, and best practice recommendations. A written personalized care plan for preventive services as well as general preventive health recommendations were provided to patient.     Mady HaagensenBritt, Magaret Justo H[place] Hills[place], [place]  11/27/2018  Willow OraJose Paz, MD

## 2018-11-27 ENCOUNTER — Encounter: Payer: Self-pay | Admitting: *Deleted

## 2018-11-27 ENCOUNTER — Encounter: Payer: Self-pay | Admitting: Family Medicine

## 2018-11-27 ENCOUNTER — Other Ambulatory Visit: Payer: Self-pay

## 2018-11-27 ENCOUNTER — Ambulatory Visit: Payer: Self-pay

## 2018-11-27 ENCOUNTER — Ambulatory Visit (INDEPENDENT_AMBULATORY_CARE_PROVIDER_SITE_OTHER): Payer: Medicare Other | Admitting: *Deleted

## 2018-11-27 ENCOUNTER — Ambulatory Visit (INDEPENDENT_AMBULATORY_CARE_PROVIDER_SITE_OTHER): Payer: Medicare Other | Admitting: Family Medicine

## 2018-11-27 VITALS — Ht 65.0 in | Wt 207.0 lb

## 2018-11-27 DIAGNOSIS — S52514D Nondisplaced fracture of right radial styloid process, subsequent encounter for closed fracture with routine healing: Secondary | ICD-10-CM

## 2018-11-27 DIAGNOSIS — Z0000 Encounter for general adult medical examination without abnormal findings: Secondary | ICD-10-CM | POA: Diagnosis not present

## 2018-11-27 NOTE — Patient Instructions (Signed)
Please schedule your next medicare wellness visit with me in 1 yr.  Continue to eat heart healthy diet (full of fruits, vegetables, whole grains, lean protein, water--limit salt, fat, and sugar intake) and increase physical activity as tolerated.  Continue doing brain stimulating activities (puzzles, reading, adult coloring books, staying active) to keep memory sharp.   Bring a copy of your living will and/or healthcare power of attorney to your next office visit.   Gail Fuentes , Thank you for taking time to come for your Medicare Wellness Visit. I appreciate your ongoing commitment to your health goals. Please review the following plan we discussed and let me know if I can assist you in the future.   These are the goals we discussed: Goals    . DIET - EAT MORE FRUITS AND VEGETABLES    . Increase physical activity    . To be out and more social.    . Would like to [place] a group of female friends       This is a list of the screening recommended for you and due dates:  Health Maintenance  Topic Date Due  . Flu Shot  12/06/2018  . Mammogram  02/19/2020  . Colon Cancer Screening  11/18/2024  . Tetanus Vaccine  07/17/2025  . DEXA scan (bone density measurement)  Completed  .  Hepatitis C: One time screening is recommended by Center for Disease Control  (CDC) for  adults born from [birth year] through 1964.   Completed  . Pneumonia vaccines  Completed    Health Maintenance After Age 60 After age 40, you are at a higher risk for certain long-term diseases and infections as well as injuries from falls. Falls are a major cause of broken bones and head injuries in people who are older than age 25. Getting regular preventive care can help to keep you healthy and well. Preventive care includes getting regular testing and making lifestyle changes as recommended by your health care provider. Talk with your health care provider about:  Which screenings and tests you should have. A screening is a  test that checks for a disease when you have no symptoms.  A diet and exercise plan that is right for you. What should I know about screenings and tests to prevent falls? Screening and testing are the best ways to find a health problem early. Early diagnosis and treatment give you the best chance of managing medical conditions that are common after age 46. Certain conditions and lifestyle choices may make you more likely to have a fall. Your health care provider may recommend:  Regular vision checks. Poor vision and conditions such as cataracts can make you more likely to have a fall. If you wear glasses, make sure to get your prescription updated if your vision changes.  Medicine review. Work with your health care provider to regularly review all of the medicines you are taking, including over-the-counter medicines. Ask your health care provider about any side effects that may make you more likely to have a fall. Tell your health care provider if any medicines that you take make you feel dizzy or sleepy.  Osteoporosis screening. Osteoporosis is a condition that causes the bones to get weaker. This can make the bones weak and cause them to break more easily.  Blood pressure screening. Blood pressure changes and medicines to control blood pressure can make you feel dizzy.  Strength and balance checks. Your health care provider may recommend certain tests to check your strength  and balance while standing, walking, or changing positions.  Foot health exam. Foot pain and numbness, as well as not wearing proper footwear, can make you more likely to have a fall.  Depression screening. You may be more likely to have a fall if you have a fear of falling, feel emotionally low, or feel unable to do activities that you used to do.  Alcohol use screening. Using too much alcohol can affect your balance and may make you more likely to have a fall. What actions can I take to lower my risk of falls? General  instructions  Talk with your health care provider about your risks for falling. Tell your health care provider if: ? You fall. Be sure to tell your health care provider about all falls, even ones that seem minor. ? You feel dizzy, sleepy, or off-balance.  Take over-the-counter and prescription medicines only as told by your health care provider. These include any supplements.  Eat a healthy diet and maintain a healthy weight. A healthy diet includes low-fat dairy products, low-fat (lean) meats, and fiber from whole grains, beans, and lots of fruits and vegetables. Home safety  Remove any tripping hazards, such as rugs, cords, and clutter.  Install safety equipment such as grab bars in bathrooms and safety rails on stairs.  Keep rooms and walkways well-lit. Activity   Follow a regular exercise program to stay fit. This will help you maintain your balance. Ask your health care provider what types of exercise are appropriate for you.  If you need a cane or walker, use it as recommended by your health care provider.  Wear supportive shoes that have nonskid soles. Lifestyle  Do not drink alcohol if your health care provider tells you not to drink.  If you drink alcohol, limit how much you have: ? 0-1 drink a day for women. ? 0-2 drinks a day for men.  Be aware of how much alcohol is in your drink. In the [place] one drink equals one typical bottle of beer (12 oz), one-half glass of wine (5 oz), or one shot of hard liquor (1 oz).  Do not use any products that contain nicotine or tobacco, such as cigarettes and e-cigarettes. If you need help quitting, ask your health care provider. Summary  Having a healthy lifestyle and getting preventive care can help to protect your health and wellness after age 60.  Screening and testing are the best way to find a health problem early and help you avoid having a fall. Early diagnosis and treatment give you the best chance for managing medical  conditions that are more common for people who are older than age 59.  Falls are a major cause of broken bones and head injuries in people who are older than age 76. Take precautions to prevent a fall at home.  Work with your health care provider to learn what changes you can make to improve your health and wellness and to prevent falls. This information is not intended to replace advice given to you by your health care provider. Make sure you discuss any questions you have with your health care provider. Document Released: 03/06/2017 Document Revised: 08/14/2018 Document Reviewed: 03/06/2017 Elsevier Patient Education  2020 [employer].

## 2018-11-27 NOTE — Progress Notes (Signed)
Gail Fuentes - 73 y.o. female MRN [id number]  Date of birth: [birth year]  SUBJECTIVE:  Including CC & ROS.  Chief Complaint  Patient presents with  . Follow-up    follow up for right wrist    Gail Fuentes is a 73 y.o. female that is following up for right distal radius styloid process fracture.  Has went through physical therapy and feels her grip strength is improving.  Has some swelling over the distal radius.  Has intermittent changes in sensation over the thumb and finger.  Denies any significant pain at this time..  Independent review of the right wrist x-ray from 7/2 shows stable appearance of the nondisplaced intra-articular fracture of the radial styloid.   Review of Systems  Constitutional: Negative for fever.  HENT: Negative for congestion.   Respiratory: Negative for cough.   Cardiovascular: Negative for chest pain.  Gastrointestinal: Negative for abdominal pain.  Musculoskeletal: Positive for arthralgias.  Skin: Negative for color change.  Neurological: Negative for weakness.  Hematological: Negative for adenopathy.    HISTORY: Past Medical, Surgical, Social, and Family History Reviewed & Updated per EMR.   Pertinent Historical Findings include:  Past Medical History:  Diagnosis Date  . Abnormal EKG    per patient- states evaluated by Dr Verl Blalock in the past  . Allergy    sneezing  late Fall  . Anemia 01/23/12   H/H 11.7/35  . Anxiety state, unspecified   . Arthritis   . Benign paroxysmal positional vertigo   . Depression   . Depressive disorder, not elsewhere classified   . Diverticulosis   . Esophageal reflux   . Esophageal stricture   . Gallstones   . Heart murmur   . Hiatal hernia   . History of hyperparathyroidism   . History of UTI   . HTN (hypertension)   . Hyperlipidemia   . OSA (obstructive sleep apnea) 05/27/2018  . Other abnormal glucose   . Personal history of other diseases of digestive system    12-15 since 1990, no recurrence 2006   . PONV (postoperative nausea and vomiting)   . Unspecified hypothyroidism   . Vitamin D deficiency     Past Surgical History:  Procedure Laterality Date  . [place]  . CHOLECYSTECTOMY  2004  . COLONOSCOPY  12/2003   Tics  . DENTAL SURGERY  1962, 2012   Age 65-Abscessed Teeth Extracted   . PARATHYROIDECTOMY Left 02/13/2013   Procedure: LEFT INFERIOR PARATHYROIDECTOMY with frozen section ;  Surgeon: Earnstine Regal, MD;  Location: [facility];  Service: General;  Laterality: Left;  . TOTAL ABDOMINAL HYSTERECTOMY W/ BILATERAL SALPINGOOPHORECTOMY  2005   Ovarian Cyst    Allergies  Allergen Reactions  . Augmentin [Amoxicillin-Pot Clavulanate] Rash    Family History  Problem Relation Age of Onset  . Coronary artery disease Father   . Heart attack Father 50  . COPD Father   . Diabetes Mother   . Dysrhythmia Mother   . Depression Mother   . COPD Mother   . Colon polyps Mother   . Kidney disease Mother   . Prostate cancer Brother   . Other Brother        Wegener's in sinus; [place]  . Heart disease Brother   . Arthritis Brother   . Hypertension Brother   . Prostate cancer Brother   . Cancer Brother        Soft tissue in the face/temple  . Stroke Maternal Aunt        >  65  . Stroke Maternal Grandmother        >65  . Stroke Maternal Grandfather 75     Social History   Socioeconomic History  . Marital status: Married    Spouse name: Not on file  . Number of children: 1  . Years of education: Not on file  . Highest education level: Not on file  Occupational History  . 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 Occupation: retired Visual merchandiser  Social Needs  . Financial resource strain: Not on file  . Food insecurity    Worry: Not on file    Inability: Not on file  . Transportation needs    Medical: Not on file    Non-medical: Not on file  Tobacco Use  . Smoking status: Former Smoker    Quit date: 05/07/1973    Years since  quitting: 45.5  . Smokeless tobacco: Never Used  . Tobacco comment: Smoked 1968-1975 up to 2 ppd  Substance and Sexual Activity  . Alcohol use: Yes    Alcohol/week: 14.0 standard drinks    Types: 14 Standard drinks or equivalent per week    Comment: 2 nightly wines  . Drug use: No  . Sexual activity: Yes  Lifestyle  . Physical activity    Days per week: Not on file    Minutes per session: Not on file  . Stress: Not on file  Relationships  . Social Musician on phone: Not on file    Gets together: Not on file    Attends religious service: Not on file    Active member of club or organization: Not on file    Attends meetings of clubs or organizations: Not on file    Relationship status: Not on file  . Intimate partner violence    Fear of current or ex partner: Not on file    Emotionally abused: Not on file    Physically abused: Not on file    Forced sexual activity: Not on file  Other Topics Concern  . Not on file  Social History Narrative   Regular exercise: no   Caffeine use: daily     PHYSICAL EXAM:  VS: Ht 5\' 5"  (1.651 m)   Wt 207 lb (93.9 kg)   BMI 34.45 kg/m  Physical Exam Gen: NAD, alert, cooperative with exam, well-appearing ENT: normal lips, normal nasal mucosa,  Eye: normal EOM, normal conjunctiva and lids CV:  no edema, +2 pedal pulses   Resp: no accessory muscle use, non-labored,   Skin: no rashes, no areas of induration  Neuro: normal tone, normal sensation to touch Psych:  normal insight, alert and oriented MSK:  Right wrist:  Swelling over the distal radius  Good strength with grip. Some tenderness over the distal radius. Good wrist range of motion. Neurovascularly intact  Limited ultrasound: Right wrist:  Healing distal radius fracture with callus formation. Short view showing the first dorsal compartment has increased hyperemia in this area. Fracture is nondisplaced.   There does not appear to be synovitis occurring in the first  dorsal compartment  Summary: Healing distal radius fracture.  Ultrasound and interpretation by Clare Gandy, MD      ASSESSMENT & PLAN:   Closed nondisplaced fracture of styloid process of right radius Fracture occurred on 6/21. Has some swelling over the distal radius.  Does not appear to be related to the first dorsal compartment.  Has good callus on ultrasound. -Counseled  on the use of the brace and its continued use if she feels like she needs to wear it. -Continue physical therapy. -Has an appoint with endocrinologist.  Provided her information about Prolia -Follow-up in 4 weeks.

## 2018-11-27 NOTE — Assessment & Plan Note (Signed)
Fracture occurred on 6/21. Has some swelling over the distal radius.  Does not appear to be related to the first dorsal compartment.  Has good callus on ultrasound. -Counseled on the use of the brace and its continued use if she feels like she needs to wear it. -Continue physical therapy. -Has an appoint with endocrinologist.  Provided her information about Prolia -Follow-up in 4 weeks.

## 2018-11-27 NOTE — Patient Instructions (Signed)
Good to see you Please continue icing and tylenol for pain  Please continue with Physical therapy  Please wear the brace if you're going out.   Please send me a message in MyChart with any questions or updates.  Please see me back in 4 weeks.   --Dr. Raeford Razor

## 2018-12-02 ENCOUNTER — Ambulatory Visit: Payer: Medicare Other | Admitting: Psychology

## 2018-12-03 ENCOUNTER — Ambulatory Visit (HOSPITAL_BASED_OUTPATIENT_CLINIC_OR_DEPARTMENT_OTHER)
Admission: RE | Admit: 2018-12-03 | Discharge: 2018-12-03 | Disposition: A | Discharge status: Home / Self Care | Payer: Medicare Other | Source: Ambulatory Visit | Attending: Internal Medicine | Admitting: Internal Medicine

## 2018-12-03 ENCOUNTER — Encounter: Payer: Self-pay | Admitting: Physical Therapy

## 2018-12-03 ENCOUNTER — Ambulatory Visit: Payer: Medicare Other | Admitting: Physical Therapy

## 2018-12-03 ENCOUNTER — Other Ambulatory Visit: Payer: Self-pay

## 2018-12-03 DIAGNOSIS — E210 Primary hyperparathyroidism: Secondary | ICD-10-CM | POA: Diagnosis not present

## 2018-12-03 DIAGNOSIS — S52514D Nondisplaced fracture of right radial styloid process, subsequent encounter for closed fracture with routine healing: Secondary | ICD-10-CM | POA: Insufficient documentation

## 2018-12-03 DIAGNOSIS — M6281 Muscle weakness (generalized): Secondary | ICD-10-CM

## 2018-12-03 DIAGNOSIS — M8080XD Other osteoporosis with current pathological fracture, unspecified site, subsequent encounter for fracture with routine healing: Secondary | ICD-10-CM | POA: Insufficient documentation

## 2018-12-03 DIAGNOSIS — X58XXXD Exposure to other specified factors, subsequent encounter: Secondary | ICD-10-CM | POA: Diagnosis not present

## 2018-12-03 DIAGNOSIS — R600 Localized edema: Secondary | ICD-10-CM

## 2018-12-03 DIAGNOSIS — M25631 Stiffness of right wrist, not elsewhere classified: Secondary | ICD-10-CM

## 2018-12-03 DIAGNOSIS — M25531 Pain in right wrist: Secondary | ICD-10-CM | POA: Diagnosis not present

## 2018-12-03 NOTE — Therapy (Signed)
[facility] [address]  [place], [place], [address] Phone: [phone]   Fax:  [phone]  Physical Therapy Treatment  Patient Details  Name: Gail Fuentes MRN: [id number] Date of Birth: Aug 26, 1945 Referring Provider (PT): Clearance Coots, MD   Encounter Date: 12/03/2018  PT End of Session - 12/03/18 1146    Visit Number  4    Number of Visits  5    Date for PT Re-Evaluation  12/11/18    Authorization Type  [employer] Medicare    PT Start Time  1103    PT Stop Time  1143    PT Time Calculation (min)  40 min    Activity Tolerance  Patient tolerated treatment well    Behavior During Therapy  [facility] for tasks assessed/performed       Past Medical History:  Diagnosis Date  . Abnormal EKG    per patient- states evaluated by Dr Verl Blalock in the past  . Allergy    sneezing  late Fall  . Anemia 01/23/12   H/H 11.7/35  . Anxiety state, unspecified   . Arthritis   . Benign paroxysmal positional vertigo   . Depression   . Depressive disorder, not elsewhere classified   . Diverticulosis   . Esophageal reflux   . Esophageal stricture   . Gallstones   . Heart murmur   . Hiatal hernia   . History of hyperparathyroidism   . History of UTI   . HTN (hypertension)   . Hyperlipidemia   . OSA (obstructive sleep apnea) 05/27/2018  . Other abnormal glucose   . Personal history of other diseases of digestive system    12-15 since 1990, no recurrence 2006  . PONV (postoperative nausea and vomiting)   . Unspecified hypothyroidism   . Vitamin D deficiency     Past Surgical History:  Procedure Laterality Date  . [place]  . CHOLECYSTECTOMY  2004  . COLONOSCOPY  12/2003   Tics  . DENTAL SURGERY  1962, 2012   Age 73-Abscessed Teeth Extracted   . PARATHYROIDECTOMY Left 02/13/2013   Procedure: LEFT INFERIOR PARATHYROIDECTOMY with frozen section ;  Surgeon: Earnstine Regal, MD;  Location: [facility];  Service: General;   Laterality: Left;  . TOTAL ABDOMINAL HYSTERECTOMY W/ BILATERAL SALPINGOOPHORECTOMY  2005   Ovarian Cyst    There were no vitals filed for this visit.  Subjective Assessment - 12/03/18 1103    Subjective  Reports that the [name] that her MD did showed healing in the R wrist. Cooked dinner for 8 people on Friday without wrist brace and had some soreness. Overall, pain levels have been good since last session.    Pertinent History  anemia, anxiety, BPPV, depression, hiatal hernia, HTN, HLD, hypothyroidism    Diagnostic tests  11/06/18 R wrist xray: Stable appearance of the nondisplaced intra-articular fracture of the radial styloid    Patient Stated Goals  "to feel that this is healed and to return to my normal activities"    Currently in Pain?  Yes    Pain Score  1     Pain Location  Wrist    Pain Orientation  Right    Pain Descriptors / Indicators  Dull    Pain Type  Acute pain                       [facility] Adult PT Treatment/Exercise - 12/03/18 0001  Elbow Exercises   Elbow Flexion  Strengthening;Right;Seated;Limitations;15 reps   3# ankle weight wrapped around forearm     Hand Exercises   Hand Gripper with Large Beads  --    Digiticizer  R hand digi-flex 3 lbs x10; 5 lbs x10    Fine Motor Coordination  Rubberbands;Digit    In hand manipulation training   R lateral pinch with red putty x5 ; R thumb and index extension with black rubber band x10   mild pain in wrist     Wrist Exercises   Wrist Flexion  AROM;Right;Seated;15 reps    Wrist Flexion Limitations  elbow supported    Wrist Extension  AROM;Right;Seated;15 reps    Wrist Extension Limitations  elbow supported     Wrist Radial Deviation  Right;AROM;15 reps    Wrist Radial Deviation Limitations  thumb up    Wrist Ulnar Deviation  Right;AROM;15 reps    Wrist Ulnar Deviation Limitations  thumb up      Manual Therapy   Manual Therapy  Passive ROM;Taping    Manual therapy comments  sitting    Passive ROM  R  wrist PROM in all directions to tolerance- mild discomfort at end range ulnar and radial deviation    Kinesiotex  Edema      Kinesiotix   Edema  Edema taping pattern over R radial side of wrist             PT Education - 12/03/18 1145    Education Details  update to HEP- administered putty for grip strengthening; edu on wear time, precautions, removal of KT tape    Person(s) Educated  Patient    Methods  Explanation;Demonstration;Tactile cues;Verbal cues;Handout    Comprehension  Verbalized understanding;Returned demonstration       PT Short Term Goals - 11/25/18 1023      PT SHORT TERM GOAL #1   Title  Patient to be independent with initial HEP.    Time  2    Period  Weeks    Status  Achieved    Target Date  11/27/18        PT Long Term Goals - 11/25/18 1024      PT LONG TERM GOAL #1   Title  Patient to be independent with advanced HEP.    Time  4    Period  Weeks    Status  On-going   met for current     PT LONG TERM GOAL #2   Title  Patient to demonstrate B UE strength >=4+/5.    Time  4    Period  Weeks    Status  On-going   able to tolerate light resistance to wrist with most weakness in ulnar and radial deviation     PT LONG TERM GOAL #3   Title  Patient to demonstrate R wrist AROM symmetrical to opposite side and nonpainful.    Time  4    Period  Weeks    Status  Partially Met   improvements in R wrist flexion, extension, ulnar deviation AROM     PT LONG TERM GOAL #4   Title  Patient to demonstrate R hand grip strength >=20 lbs.    Time  4    Period  Weeks    Status  On-going   averaged 14.3 lbs on R hand           Plan - 12/03/18 1146    Clinical Impression Statement  Patient reporting good report from  MD about healing of R wrist. Patient now wearing wrist brace only when out in the community. Still showing edema over R radial side of wrist at beginning of session. Patient tolerated gentle R wrist PROM in all planes with mild discomfort  with end-range radial and ulnar deviation. Worked on progressive hand grip strengthening as well as lateral pinch and palmar pinch to increase thumb strengthening. Patient reported mild pain with resisted thumb extension, but tolerable. Ended session with KT taping to R wrist for edema. Patient reported understanding of wear time, precautions, and removal of tape. No complaints at end of session.    Comorbidities  ostepenia, anemia, anxiety, BPPV, depression, hiatal hernia, HTN, HLD, hypothyroidism    PT Treatment/Interventions  ADLs/Self Care Home Management;Cryotherapy;Electrical Stimulation;Moist Heat;Ultrasound;Functional mobility training;Therapeutic activities;Therapeutic exercise;Manual techniques;Patient/family education;Neuromuscular re-education;Passive range of motion;Dry needling;Energy conservation;Splinting;Taping;Vasopneumatic Device    PT Next Visit Plan  progress ROM; UE strengthening activities per pt. tolerance; modalities prn    Consulted and Agree with Plan of Care  Patient       Patient will benefit from skilled therapeutic intervention in order to improve the following deficits and impairments:  Hypomobility, Increased edema, Decreased activity tolerance, Decreased strength, Impaired UE functional use, Pain, Decreased range of motion, Postural dysfunction, Impaired flexibility  Visit Diagnosis: 1. Pain in right wrist   2. Stiffness of right wrist, not elsewhere classified   3. Muscle weakness (generalized)   4. Localized edema        Problem List Patient Active Problem List   Diagnosis Date Noted  . Closed nondisplaced fracture of styloid process of right radius 10/30/2018  . OSA (obstructive sleep apnea) 05/27/2018  . History of hyperparathyroidism 08/24/2014  . Obesity (BMI 30-39.9) 10/08/2013  . HTN (hypertension) 07/13/2013  . Elevated blood pressure reading without diagnosis of hypertension 06/29/2013  . Osteoporosis 11/14/2012  . Acute iritis of both eyes  10/17/2012  . Thyroid nodule 10/17/2012  . Cardiomegaly 02/20/2012  . DISTURBANCES OF SENSATION OF SMELL AND TASTE 04/19/2010  . Vitamin D deficiency 12/24/2008  . ANXIETY 12/24/2008  . DEPRESSION 12/24/2008  . BENIGN PAROXYSMAL POSITIONAL VERTIGO 12/24/2008  . HYPERGLYCEMIA 12/24/2008  . Nonspecific abnormal electrocardiogram (ECG) (EKG) 12/24/2008  . Hyperlipidemia 07/23/2007  . Hypothyroidism 07/22/2007  . GERD 07/22/2007  . DIVERTICULITIS, HX OF 07/22/2007     Janene Harvey, PT, DPT 12/03/18 11:51 AM   [facility] [address]  [place], [place], [address] Phone: [phone]   Fax:  [phone]  Name: PHOENYX MELKA MRN: [id number] Date of Birth: 1946/02/01

## 2018-12-08 ENCOUNTER — Encounter: Payer: Self-pay | Admitting: Family Medicine

## 2018-12-08 ENCOUNTER — Other Ambulatory Visit: Payer: Self-pay

## 2018-12-08 ENCOUNTER — Ambulatory Visit (INDEPENDENT_AMBULATORY_CARE_PROVIDER_SITE_OTHER): Payer: Medicare Other | Admitting: Family Medicine

## 2018-12-08 VITALS — BP 120/80 | HR 65 | Temp 97.8°F | Ht 65.0 in | Wt 208.0 lb

## 2018-12-08 DIAGNOSIS — M7751 Other enthesopathy of right foot: Secondary | ICD-10-CM | POA: Diagnosis not present

## 2018-12-08 MED ORDER — PREDNISONE 20 MG PO TABS: 40.0000 mg | ORAL_TABLET | Freq: Every day | ORAL | 0 refills | Status: AC

## 2018-12-08 NOTE — Patient Instructions (Addendum)
Ice/cold pack over area for 10-15 min twice daily.  OK to take Tylenol 1000 mg (2 extra strength tabs) or 975 mg (3 regular strength tabs) every 6 hours as needed.  Let us know if you need anything.   Bursitis  Bursitis is inflammation and irritation of a bursa, which is one of the small, fluid-filled sacs that cushion and protect the moving parts of your body. These sacs are located between bones and muscles, bones and muscle attachments, or bones and skin areas that are next to bones. A bursa protects those structures from the wear and tear that results from frequent movement. An inflamed bursa causes pain and swelling. Fluid may build up inside the sac. Bursitis is most common near joints, especially the knees, elbows, hips, and shoulders. What are the causes? This condition may be caused by:  Injury from: ? A direct hit (blow), like falling on your knee or elbow. ? Overuse of a joint (repetitive stress).  Infection. This can happen if bacteria get into a bursa through a cut or scrape near a joint.  Diseases that cause joint inflammation, such as gout and rheumatoid arthritis. What increases the risk? You are more likely to develop this condition if you:  Have a job or hobby that involves a lot of repetitive stress on your joints.  Have a condition that weakens your body's defense system (immune system), such as diabetes, cancer, or HIV.  Do any of these often: ? Lift and reach overhead. ? Kneel or lean on hard surfaces. ? Run or walk. What are the signs or symptoms? The most common symptoms of this condition include:  Pain that gets worse when you move the affected body part or use it to support (bear) your body weight.  Inflammation.  Stiffness. Other symptoms include:  Redness.  Swelling.  Tenderness.  Warmth.  Pain that continues after rest.  Fever or chills. These may occur in bursitis that is caused by infection. How is this diagnosed? This condition may  be diagnosed based on:  Your medical history and a physical exam.  Imaging tests, such as an MRI.  A procedure to drain fluid from the bursa with a needle (aspiration). The fluid may be checked for signs of infection or gout.  Blood tests to rule out other causes of inflammation. How is this treated? This condition can usually be treated at home with rest, ice, applying pressure (compression), and raising the body part that is affected (elevation). This is called RICE therapy. For mild bursitis, RICE therapy may be all you need. Other treatments may include:  NSAIDs to treat pain and inflammation.  Corticosteroid medicines to fight inflammation. These medicines may be injected into and around the area of bursitis.  Aspiration of fluid from the bursa to relieve pain and improve movement.  Antibiotic medicine to treat an infected bursa.  A splint, brace, or walking aid, such as a cane.  Physical therapy if you continue to have pain or limited movement.  Surgery to remove a damaged or infected bursa. This may be needed if other treatments have not worked. Follow these instructions at home: Medicines  Take over-the-counter and prescription medicines only as told by your health care provider.  If you were prescribed an antibiotic medicine, take it as told by your health care provider. Do not stop taking the antibiotic even if you start to feel better. General instructions   Rest the affected area as told by your health care provider. ? If possible,  raise (elevate) the affected area above the level of your heart while you are sitting or lying down. ? Avoid activities that make pain worse.  Use splints, braces, pads, or walking aids as told by your health care provider.  If directed, put ice on the affected area: ? If you have a removable splint or brace, remove it as told by your health care provider. ? Put ice in a plastic bag. ? Place a towel between your skin and the bag or  between your splint or brace and the bag. ? Leave the ice on for 20 minutes, 2-3 times a day.  Keep all follow-up visits as told by your health care provider. This is important. Preventing future episodes Take actions to help prevent future episodes of bursitis.  Wear knee pads if you kneel often.  Wear sturdy running or walking shoes that fit you well.  Take breaks regularly from repetitive activity.  Warm up by stretching before doing any activity that takes a lot of effort.  Maintain a healthy weight or lose weight as recommended by your health care provider. If you need help doing this, ask your health care provider.  Exercise regularly. Start any new physical activity gradually. Contact a health care provider if you:  Have a fever.  Have chills.  Have bursitis that is not getting better with treatment or home care. Summary  Bursitis is inflammation and irritation of a bursa, which is one of the small, fluid-filled sacs that cushion and protect the moving parts of your body.  An inflamed bursa causes pain and swelling.  Bursitis is commonly diagnosed with a physical exam, but other tests are sometimes needed.  This condition can usually be treated at home with rest, ice, applying pressure (compression), and raising the body part that is affected (elevation). This is called RICE therapy. This information is not intended to replace advice given to you by your health care provider. Make sure you discuss any questions you have with your health care provider. Document Released: 04/20/2000 Document Revised: 04/05/2017 Document Reviewed: 03/08/2017 Elsevier Patient Education  2020 [employer].

## 2018-12-08 NOTE — Progress Notes (Signed)
Musculoskeletal Exam  Patient: Gail Fuentes DOB: [birth year]  DOS: 12/08/2018  SUBJECTIVE:  Chief Complaint:   Chief Complaint  Patient presents with  . Foot Pain    right    Gail Fuentes is a 73 y.o.  female for evaluation and treatment of R foot pain.   Onset:  1 day ago. Has been walking omre.  Location: Back of heel Character:  aching  Progression of issue:  is unchanged Associated symptoms: redness and swelling Treatment: to date has been acetaminophen.   Neurovascular symptoms: no  ROS: Musculoskeletal/Extremities: +R foot pain  Past Medical History:  Diagnosis Date  . Abnormal EKG    per patient- states evaluated by Dr Verl Blalock in the past  . Allergy    sneezing  late Fall  . Anemia 01/23/12   H/H 11.7/35  . Anxiety state, unspecified   . Arthritis   . Benign paroxysmal positional vertigo   . Depression   . Depressive disorder, not elsewhere classified   . Diverticulosis   . Esophageal reflux   . Esophageal stricture   . Gallstones   . Heart murmur   . Hiatal hernia   . History of hyperparathyroidism   . History of UTI   . HTN (hypertension)   . Hyperlipidemia   . OSA (obstructive sleep apnea) 05/27/2018  . Other abnormal glucose   . Personal history of other diseases of digestive system    12-15 since 1990, no recurrence 2006  . PONV (postoperative nausea and vomiting)   . Unspecified hypothyroidism   . Vitamin D deficiency     Objective: VITAL SIGNS: BP 120/80 (BP Location: Left Arm, Patient Position: Sitting, Cuff Size: Large)   Pulse 65   Temp 97.8 F (36.6 C) (Oral)   Ht 5\' 5"  (1.651 m)   Wt 208 lb (94.3 kg)   SpO2 94%   BMI 34.61 kg/m  Constitutional: Well formed, well developed. No acute distress. Thorax & Lungs: No accessory muscle use Musculoskeletal: R heel.   Tenderness to palpation: yes over posterior calcaneus Deformity: swelling noted Ecchymosis: no Erythema and mild warmth noted Neurologic: Normal sensory function.  No focal deficits noted.  Psychiatric: Normal mood. Age appropriate judgment and insight. Alert & oriented x 3.    Assessment:  Calcaneal bursitis (heel), right - Plan: predniSONE (DELTASONE) 20 MG tablet, ice, Tylenol. Activity as tolerated. If no better, will put in CAM walker.   Plan: Orders as above. F/u prn. The patient voiced understanding and agreement to the plan.   [place], DO 12/08/18  2:34 PM

## 2018-12-09 ENCOUNTER — Encounter: Payer: Medicare Other | Admitting: Physical Therapy

## 2018-12-12 ENCOUNTER — Encounter: Payer: Self-pay | Admitting: Physical Therapy

## 2018-12-12 ENCOUNTER — Ambulatory Visit: Payer: Medicare Other | Attending: Family Medicine | Admitting: Physical Therapy

## 2018-12-12 ENCOUNTER — Other Ambulatory Visit: Payer: Self-pay

## 2018-12-12 DIAGNOSIS — R600 Localized edema: Secondary | ICD-10-CM | POA: Diagnosis present

## 2018-12-12 DIAGNOSIS — M25531 Pain in right wrist: Secondary | ICD-10-CM | POA: Insufficient documentation

## 2018-12-12 DIAGNOSIS — M6281 Muscle weakness (generalized): Secondary | ICD-10-CM | POA: Diagnosis present

## 2018-12-12 DIAGNOSIS — M25631 Stiffness of right wrist, not elsewhere classified: Secondary | ICD-10-CM | POA: Diagnosis present

## 2018-12-12 NOTE — Therapy (Signed)
[facility] [address]  [place], [place], [address] Phone: [phone]   Fax:  [phone]  Physical Therapy Treatment  Patient Details  Name: Gail Fuentes MRN: [id number] Date of Birth: Sep 22, 1945 Referring Provider (PT): Clearance Coots, MD   Encounter Date: 12/12/2018  PT End of Session - 12/12/18 1008    Visit Number  5    Number of Visits  5    Date for PT Re-Evaluation  12/11/18    Authorization Type  [employer] Medicare    PT Start Time  0927    PT Stop Time  1007    PT Time Calculation (min)  40 min    Activity Tolerance  Patient tolerated treatment well    Behavior During Therapy  [facility] for tasks assessed/performed       Past Medical History:  Diagnosis Date  . Abnormal EKG    per patient- states evaluated by Dr Verl Blalock in the past  . Allergy    sneezing  late Fall  . Anemia 01/23/12   H/H 11.7/35  . Anxiety state, unspecified   . Arthritis   . Benign paroxysmal positional vertigo   . Depression   . Depressive disorder, not elsewhere classified   . Diverticulosis   . Esophageal reflux   . Esophageal stricture   . Gallstones   . Heart murmur   . Hiatal hernia   . History of hyperparathyroidism   . History of UTI   . HTN (hypertension)   . Hyperlipidemia   . OSA (obstructive sleep apnea) 05/27/2018  . Other abnormal glucose   . Personal history of other diseases of digestive system    12-15 since 1990, no recurrence 2006  . PONV (postoperative nausea and vomiting)   . Unspecified hypothyroidism   . Vitamin D deficiency     Past Surgical History:  Procedure Laterality Date  . [place]  . CHOLECYSTECTOMY  2004  . COLONOSCOPY  12/2003   Tics  . DENTAL SURGERY  1962, 2012   Age 49-Abscessed Teeth Extracted   . PARATHYROIDECTOMY Left 02/13/2013   Procedure: LEFT INFERIOR PARATHYROIDECTOMY with frozen section ;  Surgeon: Earnstine Regal, MD;  Location: [facility];  Service: General;   Laterality: Left;  . TOTAL ABDOMINAL HYSTERECTOMY W/ BILATERAL SALPINGOOPHORECTOMY  2005   Ovarian Cyst    There were no vitals filed for this visit.  Subjective Assessment - 12/12/18 0927    Subjective  Reports that she believes the tape last session helped with swelling. Wrist has been feeling good, but has had a flare up in heel pain. Feels like she doesnt notice pain in the wrist much anymore. Hasn't been using brace much. Reports 70% improvement in wrist. Feels like she still needs to work on lifting.    Pertinent History  anemia, anxiety, BPPV, depression, hiatal hernia, HTN, HLD, hypothyroidism    Diagnostic tests  11/06/18 R wrist xray: Stable appearance of the nondisplaced intra-articular fracture of the radial styloid    Patient Stated Goals  "to feel that this is healed and to return to my normal activities"    Currently in Pain?  No/denies         [facility] PT Assessment - 12/12/18 0001      Observation/Other Assessments   Focus on Therapeutic Outcomes (FOTO)   Wrist: 67 (33% limited, 30% predicted)      AROM   Right Wrist Extension  60 Degrees  Right Wrist Flexion  65 Degrees    Right Wrist Radial Deviation  20 Degrees    Right Wrist Ulnar Deviation  35 Degrees      Strength   Right Shoulder Flexion  4+/5    Right Shoulder ABduction  4+/5    Right Shoulder Internal Rotation  4/5    Right Shoulder External Rotation  4/5    Right Elbow Flexion  4+/5    Right Elbow Extension  4+/5    Right Wrist Flexion  4/5    Right Wrist Extension  4/5    Right Wrist Radial Deviation  4/5   pain over thumb   Right Wrist Ulnar Deviation  4/5    Right Hand Grip (lbs)  35.67   32, 36, 39   Left Hand Grip (lbs)  40.67   40, 40, 42                  [facility] Adult PT Treatment/Exercise - 12/12/18 0001      Wrist Exercises   Wrist Flexion  AROM;Right;Seated;10 reps    Wrist Flexion Limitations  elbow supported; against gravity    Wrist Extension  AROM;Right;Seated;10  reps    Wrist Extension Limitations  elbow supported; against gravity    Wrist Radial Deviation  Right;AROM;10 reps    Wrist Radial Deviation Limitations  thumb up; against gravity    Wrist Ulnar Deviation  Right;AROM;10 reps    Wrist Ulnar Deviation Limitations  thumb up             PT Education - 12/12/18 1008    Education Details  update to HEP; discussion on objective progress with PT    Person(s) Educated  Patient    Methods  Explanation;Demonstration;Tactile cues;Handout;Verbal cues    Comprehension  Verbalized understanding;Returned demonstration       PT Short Term Goals - 12/12/18 1011      PT SHORT TERM GOAL #1   Title  Patient to be independent with initial HEP.    Time  2    Period  Weeks    Status  Achieved    Target Date  11/27/18        PT Long Term Goals - 12/12/18 1011      PT LONG TERM GOAL #1   Title  Patient to be independent with advanced HEP.    Time  4    Period  Weeks    Status  Partially Met   met for current     PT LONG TERM GOAL #2   Title  Patient to demonstrate B UE strength >=4+/5.    Time  4    Period  Weeks    Status  Partially Met   showing improvements in radial and ulnar deviation strength     PT LONG TERM GOAL #3   Title  Patient to demonstrate R wrist AROM symmetrical to opposite side and nonpainful.    Time  4    Period  Weeks    Status  Partially Met   showing improvements in all planes     PT LONG TERM GOAL #4   Title  Patient to demonstrate R hand grip strength >=20 lbs.    Time  4    Period  Weeks    Status  Achieved   averaged 35.67 lbs on R hand           Plan - 12/12/18 1208    Clinical Impression Statement  Patient arrived to  session with report of 70% improvement in R wrist. Notes that she is noticing less pain and is not needing to wear her wrist brace as often. R wrist observably with improvement in edema at beginning of session. Strength testing revealed improvements in R wrist radial and ulnar  deviation strength. Still limited overall d/t fracture precautions. Patient is showing improvements in all planes of wrist AROM. Has now met her grip strength goal on R hand. Educated patient on wrist AROM against gravity as slow introduction to strengthening. Patient reported understanding and with no complaints at end of session. Placing patient on 30 day hold until next MD F/U- awaiting clearance for strength training in order to safely progress patient towards her goals.    Comorbidities  ostepenia, anemia, anxiety, BPPV, depression, hiatal hernia, HTN, HLD, hypothyroidism    PT Treatment/Interventions  ADLs/Self Care Home Management;Cryotherapy;Electrical Stimulation;Moist Heat;Ultrasound;Functional mobility training;Therapeutic activities;Therapeutic exercise;Manual techniques;Patient/family education;Neuromuscular re-education;Passive range of motion;Dry needling;Energy conservation;Splinting;Taping;Vasopneumatic Device    PT Next Visit Plan  30 day hold until next MD F/U    Consulted and Agree with Plan of Care  Patient       Patient will benefit from skilled therapeutic intervention in order to improve the following deficits and impairments:  Hypomobility, Increased edema, Decreased activity tolerance, Decreased strength, Impaired UE functional use, Pain, Decreased range of motion, Postural dysfunction, Impaired flexibility  Visit Diagnosis: 1. Pain in right wrist   2. Stiffness of right wrist, not elsewhere classified   3. Muscle weakness (generalized)   4. Localized edema        Problem List Patient Active Problem List   Diagnosis Date Noted  . Closed nondisplaced fracture of styloid process of right radius 10/30/2018  . OSA (obstructive sleep apnea) 05/27/2018  . History of hyperparathyroidism 08/24/2014  . Obesity (BMI 30-39.9) 10/08/2013  . HTN (hypertension) 07/13/2013  . Elevated blood pressure reading without diagnosis of hypertension 06/29/2013  . Osteoporosis  11/14/2012  . Acute iritis of both eyes 10/17/2012  . Thyroid nodule 10/17/2012  . Cardiomegaly 02/20/2012  . DISTURBANCES OF SENSATION OF SMELL AND TASTE 04/19/2010  . Vitamin D deficiency 12/24/2008  . ANXIETY 12/24/2008  . DEPRESSION 12/24/2008  . BENIGN PAROXYSMAL POSITIONAL VERTIGO 12/24/2008  . HYPERGLYCEMIA 12/24/2008  . Nonspecific abnormal electrocardiogram (ECG) (EKG) 12/24/2008  . Hyperlipidemia 07/23/2007  . Hypothyroidism 07/22/2007  . GERD 07/22/2007  . DIVERTICULITIS, HX OF 07/22/2007     Janene Harvey, PT, DPT 12/12/18 12:14 PM   [facility] [address]  [place], [place], [address] Phone: [phone]   Fax:  [phone]  Name: Gail Fuentes MRN: [id number] Date of Birth: 07-16-1945

## 2018-12-15 ENCOUNTER — Other Ambulatory Visit: Payer: Self-pay | Admitting: Family Medicine

## 2018-12-15 DIAGNOSIS — K219 Gastro-esophageal reflux disease without esophagitis: Secondary | ICD-10-CM

## 2018-12-15 NOTE — Progress Notes (Addendum)
[facility] [address], [address] [place], [place] [address] [phone] [phone]  Date:  12/17/2018   Name:  Gail RosebushDeborah W Fuentes   DOB:  May 09, 1945   MRN:  [id number]  PCP:  Gail Fuentes, Gail Fuentes    Chief Complaint: Annual Exam   History of Present Illness:  Gail Fuentes is a 73 y.o. very pleasant female patient who presents with the following:  Here today for complete physical Patient history of hypertension, sleep apnea, hypothyroidism, osteoporosis, hyperlipidemia status post parathyroidectomy  Last seen by myself in January at which point she noted worsening seasonal dysthymia.  We had her continue Prozac, plan to have her start seeing a therapist She did start therapy but only got to have 3 visits prior to pandemic.  She was not able to figure out video visits.  She hopes to get in for a real visit soon She does feel like seeing her therapist was helpful for her  She has actually felt a little better the last 10 days or so- she is not sure if this was due to taking prednisone recently for her foot  Her GYN is Dr. Reynold Fuentes-she Fuentes to see him for follow-up Dr. Elvera Fuentes is her endocrinologist- saw her in July  Her husband recently retired from his job-he was working Insurance account managerdesigning and delivering large business signs Her husband being home all the time- esp with the pandemic- has been a big adjustment  Her 72 yo grandson has been dx with OCD and anxiety, depression.  This really worries her  She tends to go to bed and also wake up thinking and worrying about him.  Colon UTD Immun:shingrix Due for a lipid panel Dr. Elvera Fuentes recommended prolia to her- she is thinking about this  She had coffee so far today and part of a muffin  Lab Results  Component Value Date   TSH 1.19 11/20/2018   tsh and calcium, vit D UTD Patient Active Problem List   Diagnosis Date Noted  . Closed nondisplaced fracture of styloid process of right radius  10/30/2018  . OSA (obstructive sleep apnea) 05/27/2018  . History of hyperparathyroidism 08/24/2014  . Obesity (BMI 30-39.9) 10/08/2013  . HTN (hypertension) 07/13/2013  . Elevated blood pressure reading without diagnosis of hypertension 06/29/2013  . Osteoporosis 11/14/2012  . Acute iritis of both eyes 10/17/2012  . Thyroid nodule 10/17/2012  . Cardiomegaly 02/20/2012  . DISTURBANCES OF SENSATION OF SMELL AND TASTE 04/19/2010  . Vitamin D deficiency 12/24/2008  . ANXIETY 12/24/2008  . DEPRESSION 12/24/2008  . BENIGN PAROXYSMAL POSITIONAL VERTIGO 12/24/2008  . HYPERGLYCEMIA 12/24/2008  . Nonspecific abnormal electrocardiogram (ECG) (EKG) 12/24/2008  . Hyperlipidemia 07/23/2007  . Hypothyroidism 07/22/2007  . GERD 07/22/2007  . DIVERTICULITIS, HX OF 07/22/2007    Past Medical History:  Diagnosis Date  . Abnormal EKG    per patient- states evaluated by Dr Daleen SquibbWall in the past  . Allergy    sneezing  late Fall  . Anemia 01/23/12   H/H 11.7/35  . Anxiety state, unspecified   . Arthritis   . Benign paroxysmal positional vertigo   . Depression   . Depressive disorder, not elsewhere classified   . Diverticulosis   . Esophageal reflux   . Esophageal stricture   . Gallstones   . Heart murmur   . Hiatal hernia   . History of hyperparathyroidism   . History of UTI   . HTN (hypertension)   .  Hyperlipidemia   . OSA (obstructive sleep apnea) 05/27/2018  . Other abnormal glucose   . Personal history of other diseases of digestive system    12-15 since 1990, no recurrence 2006  . PONV (postoperative nausea and vomiting)   . Unspecified hypothyroidism   . Vitamin D deficiency     Past Surgical History:  Procedure Laterality Date  . CESAREAN SECTION  1977  . CHOLECYSTECTOMY  2004  . COLONOSCOPY  12/2003   Tics  . DENTAL SURGERY  1962, 2012   Age 43-Abscessed Teeth Extracted   . PARATHYROIDECTOMY Left 02/13/2013   Procedure: LEFT INFERIOR PARATHYROIDECTOMY with frozen section ;   Surgeon: Velora Heckler, Fuentes;  Location: [facility];  Service: General;  Laterality: Left;  . TOTAL ABDOMINAL HYSTERECTOMY W/ BILATERAL SALPINGOOPHORECTOMY  2005   Ovarian Cyst    Social History   Tobacco Use  . Smoking status: Former Smoker    Quit date: 05/07/1973    Years since quitting: 45.6  . Smokeless tobacco: Never Used  . Tobacco comment: Smoked 1968-1975 up to 2 ppd  Substance Use Topics  . Alcohol use: Yes    Alcohol/week: 14.0 standard drinks    Types: 14 Standard drinks or equivalent per week    Comment: 2 nightly wines  . Drug use: No    Family History  Problem Relation Age of Onset  . Coronary artery disease Father   . Heart attack Father 13  . COPD Father   . Diabetes Mother   . Dysrhythmia Mother   . Depression Mother   . COPD Mother   . Colon polyps Mother   . Kidney disease Mother   . Prostate cancer Brother   . Other Brother        Wegener's in sinus; [facility]  . Heart disease Brother   . Arthritis Brother   . Hypertension Brother   . Prostate cancer Brother   . Cancer Brother        Soft tissue in the face/temple  . Stroke Maternal Aunt        >65  . Stroke Maternal Grandmother        >65  . Stroke Maternal Grandfather 47    Allergies  Allergen Reactions  . Augmentin [Amoxicillin-Pot Clavulanate] Rash    Medication list has been reviewed and updated.  Current Outpatient Medications on File Prior to Visit  Medication Sig Dispense Refill  . ALPRAZolam (XANAX) 0.5 MG tablet Take 0.5 mg by mouth 3 (three) times daily as needed for anxiety.     . Cholecalciferol (VITAMIN D3) 2000 UNITS TABS Take 2 tablets by mouth daily.     . cyanocobalamin 100 MCG tablet Take 100 mcg by mouth daily.    . fexofenadine (ALLEGRA) 180 MG tablet Take 180 mg by mouth as needed for allergies. Reported on 07/18/2015    . FLUoxetine (PROZAC) 40 MG capsule Take 40 mg by mouth every morning.     . fluticasone (FLONASE) 50 MCG/ACT nasal spray Place 1 spray into both nostrils  daily. 16 g 2  . hydrochlorothiazide (MICROZIDE) 12.5 MG capsule Take 1 capsule (12.5 mg total) by mouth daily. 90 capsule 2  . losartan (COZAAR) 25 MG tablet TAKE 2 TABLETS BY MOUTH EVERY DAY 60 tablet 11  . Menaquinone-7 (VITAMIN K2 PO) Take by mouth.    [name] omeprazole (PRILOSEC) 20 MG capsule TAKE 1 CAPSULE DAILY 90 capsule 1  . SYNTHROID 100 MCG tablet TAKE ONE TABLET DAILY AND TAKE ONE-HALF (1/2)  TABLET ON SUNDAY, 90 tablet 3   No current facility-administered medications on file prior to visit.     Review of Systems:  As per HPI- otherwise negative. No fever or chills, no chest pain or shortness of breath  Physical Examination: Vitals:   12/17/18 1124  BP: 128/80  Pulse: 64  Resp: 16  Temp: 97.7 F (36.5 C)  SpO2: 98%   Vitals:   12/17/18 1124  Weight: 209 lb (94.8 kg)  Height: 5\' 5"  (1.651 m)   Body mass index is 34.78 kg/m. Ideal Body Weight: Weight in (lb) to have BMI = 25: 149.9  GEN: WDWN, NAD, Non-toxic, A & O x 3, overweight, looks well HEENT: Atraumatic, Normocephalic. Neck supple. No masses, No LAD. Ears and Nose: No external deformity. CV: RRR, No M/G/R. No JVD. No thrill. No extra heart sounds. PULM: CTA B, no wheezes, crackles, rhonchi. No retractions. No resp. distress. No accessory muscle use. ABD: S, NT, ND, +BS. No rebound. No HSM. EXTR: No c/c/e NEURO Normal gait.  PSYCH: Normally interactive. Conversant. Not depressed or anxious appearing.  Calm demeanor.    Assessment and Plan:   ICD-10-CM   1. Physical exam  Z00.00   2. Essential hypertension  I10 CBC  3. Dysthymia  F34.1 buPROPion (WELLBUTRIN SR) 150 MG 12 hr tablet  4. Mixed hyperlipidemia  E78.2 Lipid panel  5. Elevated glucose  R73.09 Hemoglobin A1c   Complete physical exam today Catch up on labs as indicated above Recommended Shingrix vaccine Otherwise screening is up-to-date  Gail Fuentes notes that she has a lot of worries and anxiety, especially relating to her teenage grandson who  has some special needs.  At this point she would be interested in trying time besides Prozac.  Discussed how to taper off Prozac and will have her start on Wellbutrin.  I did encourage her to get in touch with her therapist, perhaps she can do a phone visit if nothing else  Asked her to see me posted on her progress  Follow-up: No follow-ups on file.  Meds ordered this encounter  Medications  . buPROPion (WELLBUTRIN SR) 150 MG 12 hr tablet    Sig: Take 1 tablet (150 mg total) by mouth 2 (two) times daily.    Dispense:  180 tablet    Refill:  2   Orders Placed This Encounter  Procedures  . CBC  . Lipid panel  . Hemoglobin A1c    @SIGN @    Signed Abbe AmsterdamJessica Ansley Stanwood, Fuentes  Received her labs- message to pt  Results for orders placed or performed in visit on 12/17/18  CBC  Result Value Ref Range   WBC 7.5 4.0 - 10.5 K/uL   RBC 4.40 3.87 - 5.11 Mil/uL   Platelets 298.0 150.0 - 400.0 K/uL   Hemoglobin 14.0 12.0 - 15.0 g/dL   HCT 16.142.4 09.636.0 - 04.546.0 %   MCV 96.3 78.0 - 100.0 fl   MCHC 33.1 30.0 - 36.0 g/dL   RDW 40.912.8 81.111.5 - 91.415.5 %  Lipid panel  Result Value Ref Range   Cholesterol 232 (H) 0 - 200 mg/dL   Triglycerides 782.9159.0 (H) 0.0 - 149.0 mg/dL   HDL 56.2179.50 >30.86>39.00 mg/dL   VLDL 57.831.8 0.0 - 46.940.0 mg/dL   LDL Cholesterol 629121 (H) 0 - 99 mg/dL   Total CHOL/HDL Ratio 3    NonHDL 152.53   Hemoglobin A1c  Result Value Ref Range   Hgb A1c MFr Bld 5.7 4.6 - 6.5 %  A1c is stable The 10-year ASCVD risk score Mikey Bussing DC Jr., et al., 2013) is: 13.8%   Values used to calculate the score:     Age: 52 years     Sex: Female     Is Non-Hispanic African American: No     Diabetic: No     Tobacco smoker: No     Systolic Blood Pressure: 335 mmHg     Is BP treated: Yes     HDL Cholesterol: 79.5 mg/dL     Total Cholesterol: 232 mg/dL

## 2018-12-17 ENCOUNTER — Encounter: Payer: Self-pay | Admitting: Family Medicine

## 2018-12-17 ENCOUNTER — Other Ambulatory Visit: Payer: Self-pay

## 2018-12-17 ENCOUNTER — Ambulatory Visit (INDEPENDENT_AMBULATORY_CARE_PROVIDER_SITE_OTHER): Payer: Medicare Other | Admitting: Family Medicine

## 2018-12-17 VITALS — BP 128/80 | HR 64 | Temp 97.7°F | Resp 16 | Ht 65.0 in | Wt 209.0 lb

## 2018-12-17 DIAGNOSIS — I10 Essential (primary) hypertension: Secondary | ICD-10-CM

## 2018-12-17 DIAGNOSIS — E782 Mixed hyperlipidemia: Secondary | ICD-10-CM

## 2018-12-17 DIAGNOSIS — F341 Dysthymic disorder: Secondary | ICD-10-CM

## 2018-12-17 DIAGNOSIS — Z0000 Encounter for general adult medical examination without abnormal findings: Secondary | ICD-10-CM

## 2018-12-17 DIAGNOSIS — R7309 Other abnormal glucose: Secondary | ICD-10-CM

## 2018-12-17 LAB — CBC
HCT: 42.4 % (ref 36.0–46.0)
Hemoglobin: 14 g/dL (ref 12.0–15.0)
MCHC: 33.1 g/dL (ref 30.0–36.0)
MCV: 96.3 fl (ref 78.0–100.0)
Platelets: 298 10*3/uL (ref 150.0–400.0)
RBC: 4.4 Mil/uL (ref 3.87–5.11)
RDW: 12.8 % (ref 11.5–15.5)
WBC: 7.5 10*3/uL (ref 4.0–10.5)

## 2018-12-17 LAB — LIPID PANEL
Cholesterol: 232 mg/dL — ABNORMAL HIGH (ref 0–200)
HDL: 79.5 mg/dL (ref >39.00)
LDL Cholesterol: 121 mg/dL — ABNORMAL HIGH (ref 0–99)
NonHDL: 152.53
Total CHOL/HDL Ratio: 3
Triglycerides: 159 mg/dL — ABNORMAL HIGH (ref 0.0–149.0)
VLDL: 31.8 mg/dL (ref 0.0–40.0)

## 2018-12-17 LAB — HEMOGLOBIN A1C: Hgb A1c MFr Bld: 5.7 % (ref 4.6–6.5)

## 2018-12-17 MED ORDER — BUPROPION HCL ER (SR) 150 MG PO TB12: 150.0000 mg | ORAL_TABLET | Freq: Two times a day (BID) | ORAL | 2 refills | Status: DC

## 2018-12-17 NOTE — Patient Instructions (Addendum)
It was good to see you today- I will be in touch with your labs asap Please get your flu shot this fall  Consider getting the shingles vaccine- shingrix- at your drug store as well I am sorry that your grandson is struggling right now- he is fortunate to have a grandmother who loves him so much!  Your love and caring surely means a lot to him and is a big support   Let's try changing you from prozac to wellbutrin for your mood symptoms Please take the prozac every other day for one week, then you can stop Then you can start on wellbutrin- take it once a day for 3 days, then go to twice a day  Please let me know how this works for you and take care! JC   Health Maintenance After Age 18 After age 59, you are at a higher risk for certain long-term diseases and infections as well as injuries from falls. Falls are a major cause of broken bones and head injuries in people who are older than age 15. Getting regular preventive care can help to keep you healthy and well. Preventive care includes getting regular testing and making lifestyle changes as recommended by your health care provider. Talk with your health care provider about:  Which screenings and tests you should have. A screening is a test that checks for a disease when you have no symptoms.  A diet and exercise plan that is right for you. What should I know about screenings and tests to prevent falls? Screening and testing are the best ways to find a health problem early. Early diagnosis and treatment give you the best chance of managing medical conditions that are common after age 32. Certain conditions and lifestyle choices may make you more likely to have a fall. Your health care provider may recommend:  Regular vision checks. Poor vision and conditions such as cataracts can make you more likely to have a fall. If you wear glasses, make sure to get your prescription updated if your vision changes.  Medicine review. Work with your health  care provider to regularly review all of the medicines you are taking, including over-the-counter medicines. Ask your health care provider about any side effects that may make you more likely to have a fall. Tell your health care provider if any medicines that you take make you feel dizzy or sleepy.  Osteoporosis screening. Osteoporosis is a condition that causes the bones to get weaker. This can make the bones weak and cause them to break more easily.  Blood pressure screening. Blood pressure changes and medicines to control blood pressure can make you feel dizzy.  Strength and balance checks. Your health care provider may recommend certain tests to check your strength and balance while standing, walking, or changing positions.  Foot health exam. Foot pain and numbness, as well as not wearing proper footwear, can make you more likely to have a fall.  Depression screening. You may be more likely to have a fall if you have a fear of falling, feel emotionally low, or feel unable to do activities that you used to do.  Alcohol use screening. Using too much alcohol can affect your balance and may make you more likely to have a fall. What actions can I take to lower my risk of falls? General instructions  Talk with your health care provider about your risks for falling. Tell your health care provider if: ? You fall. Be sure to tell your health care  provider about all falls, even ones that seem minor. ? You feel dizzy, sleepy, or off-balance.  Take over-the-counter and prescription medicines only as told by your health care provider. These include any supplements.  Eat a healthy diet and maintain a healthy weight. A healthy diet includes low-fat dairy products, low-fat (lean) meats, and fiber from whole grains, beans, and lots of fruits and vegetables. Home safety  Remove any tripping hazards, such as rugs, cords, and clutter.  Install safety equipment such as grab bars in bathrooms and safety  rails on stairs.  Keep rooms and walkways well-lit. Activity   Follow a regular exercise program to stay fit. This will help you maintain your balance. Ask your health care provider what types of exercise are appropriate for you.  If you need a cane or walker, use it as recommended by your health care provider.  Wear supportive shoes that have nonskid soles. Lifestyle  Do not drink alcohol if your health care provider tells you not to drink.  If you drink alcohol, limit how much you have: ? 0-1 drink a day for women. ? 0-2 drinks a day for men.  Be aware of how much alcohol is in your drink. In the [place] one drink equals one typical bottle of beer (12 oz), one-half glass of wine (5 oz), or one shot of hard liquor (1 oz).  Do not use any products that contain nicotine or tobacco, such as cigarettes and e-cigarettes. If you need help quitting, ask your health care provider. Summary  Having a healthy lifestyle and getting preventive care can help to protect your health and wellness after age 1.  Screening and testing are the best way to find a health problem early and help you avoid having a fall. Early diagnosis and treatment give you the best chance for managing medical conditions that are more common for people who are older than age 70.  Falls are a major cause of broken bones and head injuries in people who are older than age 71. Take precautions to prevent a fall at home.  Work with your health care provider to learn what changes you can make to improve your health and wellness and to prevent falls. This information is not intended to replace advice given to you by your health care provider. Make sure you discuss any questions you have with your health care provider. Document Released: 03/06/2017 Document Revised: 08/14/2018 Document Reviewed: 03/06/2017 Elsevier Patient Education  2020 [employer].

## 2018-12-25 ENCOUNTER — Ambulatory Visit (HOSPITAL_BASED_OUTPATIENT_CLINIC_OR_DEPARTMENT_OTHER)
Admission: RE | Admit: 2018-12-25 | Discharge: 2018-12-25 | Disposition: A | Discharge status: Home / Self Care | Payer: Medicare Other | Source: Ambulatory Visit | Attending: Family Medicine | Admitting: Family Medicine

## 2018-12-25 ENCOUNTER — Ambulatory Visit (INDEPENDENT_AMBULATORY_CARE_PROVIDER_SITE_OTHER): Payer: Medicare Other | Admitting: Family Medicine

## 2018-12-25 ENCOUNTER — Other Ambulatory Visit: Payer: Self-pay

## 2018-12-25 ENCOUNTER — Encounter: Payer: Self-pay | Admitting: Family Medicine

## 2018-12-25 VITALS — BP 124/67 | HR 59 | Ht 65.0 in | Wt 207.0 lb

## 2018-12-25 DIAGNOSIS — S52514D Nondisplaced fracture of right radial styloid process, subsequent encounter for closed fracture with routine healing: Secondary | ICD-10-CM | POA: Diagnosis present

## 2018-12-25 NOTE — Progress Notes (Signed)
Gail Fuentes - 73 y.o. female MRN [id number]  Date of birth: Sep 29, 1945  SUBJECTIVE:  Including CC & ROS.  Chief Complaint  Patient presents with  . Follow-up    follow up for right wrist    JACOBI Fuentes is a 73 y.o. female that is  Following up for her fracture. Reports no significant pain. Has been performing her home exercises. Only has mild pain with she has ulnar deviation. No swelling. No numbness or tingling.    Review of Systems  Constitutional: Negative for fever.  HENT: Negative for congestion.   Respiratory: Negative for cough.   Cardiovascular: Negative for chest pain.  Gastrointestinal: Negative for abdominal pain.  Musculoskeletal: Positive for arthralgias.  Neurological: Negative for weakness.  Hematological: Negative for adenopathy.    HISTORY: Past Medical, Surgical, Social, and Family History Reviewed & Updated per EMR.   Pertinent Historical Findings include:  Past Medical History:  Diagnosis Date  . Abnormal EKG    per patient- states evaluated by Dr Daleen Squibb in the past  . Allergy    sneezing  late Fall  . Anemia 01/23/12   H/H 11.7/35  . Anxiety state, unspecified   . Arthritis   . Benign paroxysmal positional vertigo   . Depression   . Depressive disorder, not elsewhere classified   . Diverticulosis   . Esophageal reflux   . Esophageal stricture   . Gallstones   . Heart murmur   . Hiatal hernia   . History of hyperparathyroidism   . History of UTI   . HTN (hypertension)   . Hyperlipidemia   . OSA (obstructive sleep apnea) 05/27/2018  . Other abnormal glucose   . Personal history of other diseases of digestive system    12-15 since 1990, no recurrence 2006  . PONV (postoperative nausea and vomiting)   . Unspecified hypothyroidism   . Vitamin D deficiency     Past Surgical History:  Procedure Laterality Date  . CESAREAN SECTION  1977  . CHOLECYSTECTOMY  2004  . COLONOSCOPY  12/2003   Tics  . DENTAL SURGERY  1962, 2012   Age  38-Abscessed Teeth Extracted   . PARATHYROIDECTOMY Left 02/13/2013   Procedure: LEFT INFERIOR PARATHYROIDECTOMY with frozen section ;  Surgeon: Velora Heckler, MD;  Location: [facility];  Service: General;  Laterality: Left;  . TOTAL ABDOMINAL HYSTERECTOMY W/ BILATERAL SALPINGOOPHORECTOMY  2005   Ovarian Cyst    Allergies  Allergen Reactions  . Augmentin [Amoxicillin-Pot Clavulanate] Rash    Family History  Problem Relation Age of Onset  . Coronary artery disease Father   . Heart attack Father 57  . COPD Father   . Diabetes Mother   . Dysrhythmia Mother   . Depression Mother   . COPD Mother   . Colon polyps Mother   . Kidney disease Mother   . Prostate cancer Brother   . Other Brother        Wegener's in sinus; [facility]  . Heart disease Brother   . Arthritis Brother   . Hypertension Brother   . Prostate cancer Brother   . Cancer Brother        Soft tissue in the face/temple  . Stroke Maternal Aunt        >65  . Stroke Maternal Grandmother        >65  . Stroke Maternal Grandfather 51     Social History   Socioeconomic History  . Marital status: Married    Spouse name:  Not on file  . Number of children: 1  . Years of education: Not on file  . Highest education level: Not on file  Occupational History  . Occupation: Administrator, Civil Service and homes    Employer: RETIRED  . Occupation: retired Air cabin crew  Social Needs  . Financial resource strain: Not on file  . Food insecurity    Worry: Not on file    Inability: Not on file  . Transportation needs    Medical: Not on file    Non-medical: Not on file  Tobacco Use  . Smoking status: Former Smoker    Quit date: 05/07/1973    Years since quitting: 45.6  . Smokeless tobacco: Never Used  . Tobacco comment: Smoked 1968-1975 up to 2 ppd  Substance and Sexual Activity  . Alcohol use: Yes    Alcohol/week: 14.0 standard drinks    Types: 14 Standard drinks or equivalent per week    Comment: 2 nightly wines  . Drug use: No  .  Sexual activity: Not Currently  Lifestyle  . Physical activity    Days per week: Not on file    Minutes per session: Not on file  . Stress: Not on file  Relationships  . Social Herbalist on phone: Not on file    Gets together: Not on file    Attends religious service: Not on file    Active member of club or organization: Not on file    Attends meetings of clubs or organizations: Not on file    Relationship status: Not on file  . Intimate partner violence    Fear of current or ex partner: Not on file    Emotionally abused: Not on file    Physically abused: Not on file    Forced sexual activity: Not on file  Other Topics Concern  . Not on file  Social History Narrative   Regular exercise: no   Caffeine use: daily     PHYSICAL EXAM:  VS: BP 124/67   Pulse (!) 59   Ht 5\' 5"  (1.651 m)   Wt 207 lb (93.9 kg)   BMI 34.45 kg/m  Physical Exam Gen: NAD, alert, cooperative with exam, well-appearing ENT: normal lips, normal nasal mucosa,  Eye: normal EOM, normal conjunctiva and lids CV:  no edema, +2 pedal pulses   Resp: no accessory muscle use, non-labored,  Skin: no rashes, no areas of induration  Neuro: normal tone, normal sensation to touch Psych:  normal insight, alert and oriented MSK:  Right hand/wrist:  No swelling No TTP of the distal radius  normal ROM  Normal finger adduction and abduction strength to resistance  NVI.       ASSESSMENT & PLAN:   Closed nondisplaced fracture of styloid process of right radius Has been about 8 weeks since the fracture. Doing well.  - can begin strength rehab in PT.  - will be doing prolia with endo - counseled on supportive care - can follow up in 2-3 months if needed.

## 2018-12-25 NOTE — Assessment & Plan Note (Signed)
Has been about 8 weeks since the fracture. Doing well.  - can begin strength rehab in PT.  - will be doing prolia with endo - counseled on supportive care - can follow up in 2-3 months if needed.

## 2018-12-25 NOTE — Patient Instructions (Signed)
Good to see you You can stop the K2 in a few weeks.  You can start the strengthening with physical therapy  I will call you back with the results from today   Please send me a message in MyChart with any questions or updates.  Please see me back in 2-3 months or as needed.   --Dr. Raeford Razor

## 2018-12-26 ENCOUNTER — Telehealth: Payer: Self-pay | Admitting: Family Medicine

## 2018-12-26 NOTE — Telephone Encounter (Signed)
Left VM for patient. If she calls back please have her speak with a nurse/CMA and inform that her xray is stable and healing.   If any questions then please take the best time and phone number to call and I will try to call her back.   Rosemarie Ax, MD [facility] 12/26/2018, 1:06 PM

## 2019-01-07 ENCOUNTER — Encounter: Payer: Self-pay | Admitting: Family Medicine

## 2019-01-19 ENCOUNTER — Encounter: Payer: Self-pay | Admitting: Physical Therapy

## 2019-01-19 ENCOUNTER — Other Ambulatory Visit: Payer: Self-pay

## 2019-01-19 ENCOUNTER — Ambulatory Visit: Payer: Medicare Other | Attending: Family Medicine | Admitting: Physical Therapy

## 2019-01-19 DIAGNOSIS — M25631 Stiffness of right wrist, not elsewhere classified: Secondary | ICD-10-CM | POA: Diagnosis present

## 2019-01-19 DIAGNOSIS — M6281 Muscle weakness (generalized): Secondary | ICD-10-CM | POA: Diagnosis present

## 2019-01-19 DIAGNOSIS — R600 Localized edema: Secondary | ICD-10-CM | POA: Diagnosis present

## 2019-01-19 DIAGNOSIS — M25531 Pain in right wrist: Secondary | ICD-10-CM | POA: Insufficient documentation

## 2019-01-19 NOTE — Therapy (Signed)
[facility] [address]  [place], [place], [address] Phone: [phone]   Fax:  [phone]  Physical Therapy Re-Evaluation  Patient Details  Name: Gail Fuentes MRN: [id number] Date of Birth: [birth year] Referring Provider (PT): Clearance Coots, MD   Encounter Date: 01/19/2019  PT End of Session - 01/19/19 1727    Visit Number  6    Number of Visits  12    Date for PT Re-Evaluation  03/02/19    Authorization Type  [employer] Medicare    PT Start Time  1534    PT Stop Time  1611    PT Time Calculation (min)  37 min    Activity Tolerance  Patient tolerated treatment well    Behavior During Therapy  [facility] for tasks assessed/performed       Past Medical History:  Diagnosis Date  . Abnormal EKG    per patient- states evaluated by Dr Verl Blalock in the past  . Allergy    sneezing  late Fall  . Anemia 01/23/12   H/H 11.7/35  . Anxiety state, unspecified   . Arthritis   . Benign paroxysmal positional vertigo   . Depression   . Depressive disorder, not elsewhere classified   . Diverticulosis   . Esophageal reflux   . Esophageal stricture   . Gallstones   . Heart murmur   . Hiatal hernia   . History of hyperparathyroidism   . History of UTI   . HTN (hypertension)   . Hyperlipidemia   . OSA (obstructive sleep apnea) 05/27/2018  . Other abnormal glucose   . Personal history of other diseases of digestive system    12-15 since 1990, no recurrence 2006  . PONV (postoperative nausea and vomiting)   . Unspecified hypothyroidism   . Vitamin D deficiency     Past Surgical History:  Procedure Laterality Date  . [place]  . CHOLECYSTECTOMY  2004  . COLONOSCOPY  12/2003   Tics  . DENTAL SURGERY  1962, 2012   Age 48-Abscessed Teeth Extracted   . PARATHYROIDECTOMY Left 02/13/2013   Procedure: LEFT INFERIOR PARATHYROIDECTOMY with frozen section ;  Surgeon: Earnstine Regal, MD;  Location: [facility];  Service:  General;  Laterality: Left;  . TOTAL ABDOMINAL HYSTERECTOMY W/ BILATERAL SALPINGOOPHORECTOMY  2005   Ovarian Cyst    There were no vitals filed for this visit.  Subjective Assessment - 01/19/19 1536    Subjective  Patient reports that she was cleared by her MD to return to therapy for strength training. Is out of the wrist brace full time. Still having some pain in the wrist with R thumb abduction, more stiffness in the AM, and does not feel comfortable lifting with the R arm. Pain is located over R palmar eminence and back of the thumb.    Pertinent History  anemia, anxiety, BPPV, depression, hiatal hernia, HTN, HLD, hypothyroidism    Diagnostic tests  12/25/18 R wrist xray: Unchanged appearance of nondisplaced, intra-articular fracture    Patient Stated Goals  "to feel that this is healed and to return to my normal activities"    Currently in Pain?  No/denies         [facility] PT Assessment - 01/19/19 0001      Assessment   Medical Diagnosis  Closed nondisplaced fx of radial styloid    Referring Provider (PT)  Clearance Coots, MD    Onset Date/Surgical Date  10/26/18  AROM   Right Wrist Extension  60 Degrees    Right Wrist Flexion  82 Degrees    Right Wrist Radial Deviation  15 Degrees   pain   Right Wrist Ulnar Deviation  34 Degrees      Strength   Right Wrist Flexion  4+/5    Right Wrist Extension  4/5    Right Wrist Radial Deviation  4/5   discomfort in the thumb   Right Wrist Ulnar Deviation  4/5    Right Hand Grip (lbs)  36.33   42, 37, 30   Right Hand Lateral Pinch  13.16 lbs   14, 12, 13.5   Right Hand 3 Point Pinch  10.11 lbs   9, 11, 11   Left Hand Grip (lbs)  36   40, 35, 33   Left Hand Lateral Pinch  12.16 lbs   12.5, 12, 12   Left Hand 3 Point Pinch  12.16 lbs   13, 11, 12,5                  [facility] Adult PT Treatment/Exercise - 01/19/19 0001      Wrist Exercises   Wrist Flexion  AROM;Right;Seated;10 reps    Wrist Flexion Limitations  1#  with elbow supported    Wrist Extension  AROM;Right;Seated;10 reps    Wrist Extension Limitations  1# with elbow supported    Wrist Radial Deviation  Right;AROM;10 reps    Wrist Radial Deviation Limitations  1# with elbow supported    Wrist Ulnar Deviation  Right;AROM;10 reps    Wrist Ulnar Deviation Limitations  arm down by side with 1#             PT Education - 01/19/19 1727    Education Details  discussion on progress and remaining impairments, HEP    Person(s) Educated  Patient    Methods  Explanation;Demonstration;Tactile cues;Verbal cues;Handout    Comprehension  Verbalized understanding;Returned demonstration       PT Short Term Goals - 01/19/19 1540      PT SHORT TERM GOAL #1   Title  Patient to be independent with initial HEP.    Time  2    Period  Weeks    Status  Achieved    Target Date  11/27/18        PT Long Term Goals - 01/19/19 1540      PT LONG TERM GOAL #1   Title  Patient to be independent with advanced HEP.    Time  6    Period  Weeks    Status  Partially Met   met for current   Target Date  03/02/19      PT LONG TERM GOAL #2   Title  Patient to demonstrate B wrist strength >=4+/5.    Time  6    Period  Weeks    Status  New    Target Date  03/02/19      PT LONG TERM GOAL #3   Title  Patient to demonstrate R wrist AROM symmetrical to opposite side and nonpainful.    Time  6    Period  Weeks    Status  Partially Met    Target Date  03/02/19      PT LONG TERM GOAL #4   Title  Patient to demonstrate R hand grip strength >=20 lbs.    Time  4    Period  Weeks    Status  Achieved  averaged 35.67 lbs on R hand     PT LONG TERM GOAL #5   Title  Patient to demonstrate 3 pinch grip on R hand > 13 lbs in order to improve strength in dominant hand.    Time  6    Period  Weeks    Status  New    Target Date  03/02/19      Additional Long Term Goals   Additional Long Term Goals  Yes      PT LONG TERM GOAL #6   Title  Patient to  report tolerance for reaching with R hand to place bowls on overhead shelf without pain limiting.    Time  6    Period  Weeks    Status  New    Target Date  03/02/19            Plan - 01/19/19 1731    Clinical Impression Statement  Patient returns to PT after receiving clearance from her MD to initiate strengthening to R wrist after most recent x-ray revealed unchanged radial wrist fx. Patient reports that her pain is no worse, but does have increased stiffness in the AM and has trouble lifting heaving objects overhead. Presented today with limited radial deviation ROM, decreased wrist strength, and limited 3 point pinch strength. Patient was educated on gentle strengthening HEP- reported understanding.  Would benefit from continued skilled PT services 1x/week for 6 weeks to address goals and return to PLOF.    Comorbidities  ostepenia, anemia, anxiety, BPPV, depression, hiatal hernia, HTN, HLD, hypothyroidism    PT Frequency  1x / week    PT Duration  6 weeks    PT Treatment/Interventions  ADLs/Self Care Home Management;Cryotherapy;Electrical Stimulation;Moist Heat;Ultrasound;Functional mobility training;Therapeutic activities;Therapeutic exercise;Manual techniques;Patient/family education;Neuromuscular re-education;Passive range of motion;Dry needling;Energy conservation;Splinting;Taping;Vasopneumatic Device    PT Next Visit Plan  FOTO; progress wrist and hand strengthening    Consulted and Agree with Plan of Care  Patient       Patient will benefit from skilled therapeutic intervention in order to improve the following deficits and impairments:  Hypomobility, Increased edema, Decreased activity tolerance, Decreased strength, Impaired UE functional use, Pain, Decreased range of motion, Postural dysfunction, Impaired flexibility  Visit Diagnosis: Pain in right wrist  Stiffness of right wrist, not elsewhere classified  Muscle weakness (generalized)  Localized edema     Problem  List Patient Active Problem List   Diagnosis Date Noted  . Closed nondisplaced fracture of styloid process of right radius 10/30/2018  . OSA (obstructive sleep apnea) 05/27/2018  . History of hyperparathyroidism 08/24/2014  . Obesity (BMI 30-39.9) 10/08/2013  . HTN (hypertension) 07/13/2013  . Elevated blood pressure reading without diagnosis of hypertension 06/29/2013  . Osteoporosis 11/14/2012  . Acute iritis of both eyes 10/17/2012  . Thyroid nodule 10/17/2012  . Cardiomegaly 02/20/2012  . DISTURBANCES OF SENSATION OF SMELL AND TASTE 04/19/2010  . Vitamin D deficiency 12/24/2008  . ANXIETY 12/24/2008  . DEPRESSION 12/24/2008  . BENIGN PAROXYSMAL POSITIONAL VERTIGO 12/24/2008  . HYPERGLYCEMIA 12/24/2008  . Nonspecific abnormal electrocardiogram (ECG) (EKG) 12/24/2008  . Hyperlipidemia 07/23/2007  . Hypothyroidism 07/22/2007  . GERD 07/22/2007  . DIVERTICULITIS, HX OF 07/22/2007     Janene Harvey, PT, DPT 01/19/19 5:39 PM   [facility] [address]  [place], [place], [address] Phone: [phone]   Fax:  [phone]  Name: Gail Fuentes MRN: [id number] Date of Birth: [birth year]

## 2019-01-26 ENCOUNTER — Ambulatory Visit: Payer: Medicare Other

## 2019-01-26 ENCOUNTER — Other Ambulatory Visit: Payer: Self-pay

## 2019-01-26 DIAGNOSIS — M25531 Pain in right wrist: Secondary | ICD-10-CM

## 2019-01-26 DIAGNOSIS — R600 Localized edema: Secondary | ICD-10-CM

## 2019-01-26 DIAGNOSIS — M6281 Muscle weakness (generalized): Secondary | ICD-10-CM

## 2019-01-26 DIAGNOSIS — M25631 Stiffness of right wrist, not elsewhere classified: Secondary | ICD-10-CM

## 2019-01-26 NOTE — Therapy (Signed)
[facility] [address]  [place], [place], [address] Phone: [phone]   Fax:  [phone]  Physical Therapy Treatment  Patient Details  Name: DEDRIA Fuentes MRN: [id number] Date of Birth: 1946-02-26 Referring Provider (PT): Clearance Coots, MD   Encounter Date: 01/26/2019  PT End of Session - 01/26/19 1026    Visit Number  7    Number of Visits  12    Date for PT Re-Evaluation  03/02/19    Authorization Type  [employer] Medicare    PT Start Time  1016    PT Stop Time  1054    PT Time Calculation (min)  38 min    Activity Tolerance  Patient tolerated treatment well    Behavior During Therapy  [facility] for tasks assessed/performed       Past Medical History:  Diagnosis Date  . Abnormal EKG    per patient- states evaluated by Dr Verl Blalock in the past  . Allergy    sneezing  late Fall  . Anemia 01/23/12   H/H 11.7/35  . Anxiety state, unspecified   . Arthritis   . Benign paroxysmal positional vertigo   . Depression   . Depressive disorder, not elsewhere classified   . Diverticulosis   . Esophageal reflux   . Esophageal stricture   . Gallstones   . Heart murmur   . Hiatal hernia   . History of hyperparathyroidism   . History of UTI   . HTN (hypertension)   . Hyperlipidemia   . OSA (obstructive sleep apnea) 05/27/2018  . Other abnormal glucose   . Personal history of other diseases of digestive system    12-15 since 1990, no recurrence 2006  . PONV (postoperative nausea and vomiting)   . Unspecified hypothyroidism   . Vitamin D deficiency     Past Surgical History:  Procedure Laterality Date  . [place]  . CHOLECYSTECTOMY  2004  . COLONOSCOPY  12/2003   Tics  . DENTAL SURGERY  1962, 2012   Age 25-Abscessed Teeth Extracted   . PARATHYROIDECTOMY Left 02/13/2013   Procedure: LEFT INFERIOR PARATHYROIDECTOMY with frozen section ;  Surgeon: Earnstine Regal, MD;  Location: [facility];  Service: General;   Laterality: Left;  . TOTAL ABDOMINAL HYSTERECTOMY W/ BILATERAL SALPINGOOPHORECTOMY  2005   Ovarian Cyst    There were no vitals filed for this visit.  Subjective Assessment - 01/26/19 1022    Subjective  Pt. noting some soreness after last session which recovered next day after tx.    Pertinent History  anemia, anxiety, BPPV, depression, hiatal hernia, HTN, HLD, hypothyroidism    Diagnostic tests  12/25/18 R wrist xray: Unchanged appearance of nondisplaced, intra-articular fracture    Patient Stated Goals  "to feel that this is healed and to return to my normal activities"    Currently in Pain?  No/denies    Pain Score  0-No pain   2/10 at worst with picking up coffee pot   Pain Location  Wrist    Pain Orientation  Right    Pain Descriptors / Indicators  Dull    Pain Type  Acute pain    Aggravating Factors   using R hand to lift coffee pot    Pain Relieving Factors  rest    Multiple Pain Sites  No  [place] Adult PT Treatment/Exercise - 01/26/19 0001      Shoulder Exercises: ROM/Strengthening   UBE (Upper Arm Bike)  lvl 1.0, 3 min forwards     Nustep  Lvl 2, 3 min (UE/LE)      Hand Exercises   Other Hand Exercises  R stovell grip squeezer (yellow spring 15#) x 15 reps       Wrist Exercises   Wrist Flexion  AROM;Right;Seated;10 reps    Bar Weights/Barbell (Wrist Flexion)  1 lb    Wrist Flexion Limitations  1#    Wrist Extension  AROM;Right;Seated;10 reps    Bar Weights/Barbell (Wrist Extension)  1 lb    Wrist Extension Limitations  1#    Wrist Radial Deviation  Right;AROM;10 reps    Bar Weights/Barbell (Radial Deviation)  1 lb    Wrist Radial Deviation Limitations  1#    Wrist Ulnar Deviation  Right;AROM;10 reps    Bar Weights/Barbell (Ulnar Deviation)  1 lb    Wrist Ulnar Deviation Limitations  1#    Other wrist exercises  R wrist flexion/extension stretch x 30 sec with L hand assistance     Other wrist exercises  Gentle R ulnar/radial  deviation stretch with L hand assistance with light overpressure x 30 sec                PT Short Term Goals - 01/19/19 1540      PT SHORT TERM GOAL #1   Title  Patient to be independent with initial HEP.    Time  2    Period  Weeks    Status  Achieved    Target Date  11/27/18        PT Long Term Goals - 01/26/19 1027      PT LONG TERM GOAL #1   Title  Patient to be independent with advanced HEP.    Time  6    Period  Weeks    Status  Partially Met   met for current     PT LONG TERM GOAL #2   Title  Patient to demonstrate B wrist strength >=4+/5.    Time  6    Period  Weeks    Status  On-going      PT LONG TERM GOAL #3   Title  Patient to demonstrate R wrist AROM symmetrical to opposite side and nonpainful.    Time  6    Period  Weeks    Status  Partially Met      PT LONG TERM GOAL #4   Title  Patient to demonstrate R hand grip strength >=20 lbs.    Time  4    Period  Weeks    Status  Achieved   averaged 35.67 lbs on R hand     PT LONG TERM GOAL #5   Title  Patient to demonstrate 3 pinch grip on R hand > 13 lbs in order to improve strength in dominant hand.    Time  6    Period  Weeks    Status  On-going      PT LONG TERM GOAL #6   Title  Patient to report tolerance for reaching with R hand to place bowls on overhead shelf without pain limiting.    Time  6    Period  Weeks    Status  On-going            Plan - 01/26/19 1252    Clinical Impression Statement  Gail Fuentes reporting muscular soreness at R wrist which subsided next day after last session.  Tolerated mild progression of R wrist/hand strengthening activities today well without pain.  Does require cueing for appropriate pacing of activities and hold times.  She denies problems with recent HEP updated.  Will continue to progress strengthening activities per pt. in coming session.    Personal Factors and Comorbidities  Age;Comorbidity 3+;Time since onset of  injury/illness/exacerbation;Fitness;Past/Current Experience    Comorbidities  ostepenia, anemia, anxiety, BPPV, depression, hiatal hernia, HTN, HLD, hypothyroidism    Rehab Potential  Good    PT Treatment/Interventions  ADLs/Self Care Home Management;Cryotherapy;Electrical Stimulation;Moist Heat;Ultrasound;Functional mobility training;Therapeutic activities;Therapeutic exercise;Manual techniques;Patient/family education;Neuromuscular re-education;Passive range of motion;Dry needling;Energy conservation;Splinting;Taping;Vasopneumatic Device    PT Next Visit Plan  Progress wrist and hand strengthening    Consulted and Agree with Plan of Care  Patient       Patient will benefit from skilled therapeutic intervention in order to improve the following deficits and impairments:  Hypomobility, Increased edema, Decreased activity tolerance, Decreased strength, Impaired UE functional use, Pain, Decreased range of motion, Postural dysfunction, Impaired flexibility  Visit Diagnosis: Pain in right wrist  Stiffness of right wrist, not elsewhere classified  Muscle weakness (generalized)  Localized edema     Problem List Patient Active Problem List   Diagnosis Date Noted  . Closed nondisplaced fracture of styloid process of right radius 10/30/2018  . OSA (obstructive sleep apnea) 05/27/2018  . History of hyperparathyroidism 08/24/2014  . Obesity (BMI 30-39.9) 10/08/2013  . HTN (hypertension) 07/13/2013  . Elevated blood pressure reading without diagnosis of hypertension 06/29/2013  . Osteoporosis 11/14/2012  . Acute iritis of both eyes 10/17/2012  . Thyroid nodule 10/17/2012  . Cardiomegaly 02/20/2012  . DISTURBANCES OF SENSATION OF SMELL AND TASTE 04/19/2010  . Vitamin D deficiency 12/24/2008  . ANXIETY 12/24/2008  . DEPRESSION 12/24/2008  . BENIGN PAROXYSMAL POSITIONAL VERTIGO 12/24/2008  . HYPERGLYCEMIA 12/24/2008  . Nonspecific abnormal electrocardiogram (ECG) (EKG) 12/24/2008  .  Hyperlipidemia 07/23/2007  . Hypothyroidism 07/22/2007  . GERD 07/22/2007  . DIVERTICULITIS, HX OF 07/22/2007    Gail Fuentes, PTA 01/26/19 12:57 PM   [facility] [address]  [place], [place], [address] Phone: [phone]   Fax:  [phone]  Name: Gail Fuentes MRN: [id number] Date of Birth: 1945-11-29

## 2019-02-02 ENCOUNTER — Ambulatory Visit: Payer: Medicare Other | Admitting: Physical Therapy

## 2019-02-02 ENCOUNTER — Encounter: Payer: Self-pay | Admitting: Physical Therapy

## 2019-02-02 ENCOUNTER — Other Ambulatory Visit: Payer: Self-pay

## 2019-02-02 DIAGNOSIS — M25531 Pain in right wrist: Secondary | ICD-10-CM

## 2019-02-02 DIAGNOSIS — R600 Localized edema: Secondary | ICD-10-CM

## 2019-02-02 DIAGNOSIS — M6281 Muscle weakness (generalized): Secondary | ICD-10-CM

## 2019-02-02 DIAGNOSIS — M25631 Stiffness of right wrist, not elsewhere classified: Secondary | ICD-10-CM

## 2019-02-02 NOTE — Therapy (Signed)
[facility] [address]  [place], [place], [address] Phone: [phone]   Fax:  [phone]  Physical Therapy Treatment  Patient Details  Name: Gail Fuentes MRN: [id number] Date of Birth: [birth year] Referring Provider (PT): Clearance Coots, MD   Encounter Date: 02/02/2019  PT End of Session - 02/02/19 1157    Visit Number  8    Number of Visits  12    Date for PT Re-Evaluation  03/02/19    Authorization Type  [employer] Medicare    PT Start Time  1016    PT Stop Time  1057    PT Time Calculation (min)  41 min    Activity Tolerance  Patient tolerated treatment well    Behavior During Therapy  [facility] for tasks assessed/performed       Past Medical History:  Diagnosis Date  . Abnormal EKG    per patient- states evaluated by Dr Verl Blalock in the past  . Allergy    sneezing  late Fall  . Anemia 01/23/12   H/H 11.7/35  . Anxiety state, unspecified   . Arthritis   . Benign paroxysmal positional vertigo   . Depression   . Depressive disorder, not elsewhere classified   . Diverticulosis   . Esophageal reflux   . Esophageal stricture   . Gallstones   . Heart murmur   . Hiatal hernia   . History of hyperparathyroidism   . History of UTI   . HTN (hypertension)   . Hyperlipidemia   . OSA (obstructive sleep apnea) 05/27/2018  . Other abnormal glucose   . Personal history of other diseases of digestive system    12-15 since 1990, no recurrence 2006  . PONV (postoperative nausea and vomiting)   . Unspecified hypothyroidism   . Vitamin D deficiency     Past Surgical History:  Procedure Laterality Date  . [place]  . CHOLECYSTECTOMY  2004  . COLONOSCOPY  12/2003   Tics  . DENTAL SURGERY  1962, 2012   Age 55-Abscessed Teeth Extracted   . PARATHYROIDECTOMY Left 02/13/2013   Procedure: LEFT INFERIOR PARATHYROIDECTOMY with frozen section ;  Surgeon: Earnstine Regal, MD;  Location: [facility];  Service: General;   Laterality: Left;  . TOTAL ABDOMINAL HYSTERECTOMY W/ BILATERAL SALPINGOOPHORECTOMY  2005   Ovarian Cyst    There were no vitals filed for this visit.  Subjective Assessment - 02/02/19 1018    Subjective  Not much is new. Wrist is feeling about the same.    Pertinent History  anemia, anxiety, BPPV, depression, hiatal hernia, HTN, HLD, hypothyroidism    Diagnostic tests  12/25/18 R wrist xray: Unchanged appearance of nondisplaced, intra-articular fracture    Patient Stated Goals  "to feel that this is healed and to return to my normal activities"    Currently in Pain?  No/denies                       [facility] Adult PT Treatment/Exercise - 02/02/19 0001      Exercises   Exercises  Elbow      Elbow Exercises   Elbow Flexion  Strengthening;Right;Seated;Limitations;10 reps    Bar Weights/Barbell (Elbow Flexion)  4 lbs    Elbow Flexion Limitations  10x each in 3 orientations- supination, pronation, neutral   cues to keep wrist neutral   Forearm Supination  Strengthening;Right;10 reps;Standing    Forearm Supination Limitations  mid grip on  hammer    Forearm Pronation  Strengthening;Right;10 reps;Standing    Forearm Pronation Limitations  mid grip on hammer    Wrist Flexion  AROM;Right;Seated;10 reps    Bar Weights/Barbell (Wrist Flexion)  1 lb;2 lbs    Wrist Extension  AROM;Right;Seated;10 reps    Bar Weights/Barbell (Wrist Extension)  1 lb;2 lbs      Shoulder Exercises: ROM/Strengthening   UBE (Upper Arm Bike)  lvl 1.5, 3 min forwards/ 3 min backwards      Wrist Exercises   Wrist Radial Deviation  Strengthening;Right;10 reps;Standing    Wrist Radial Deviation Limitations  2x10; gripping at end of hammer    Wrist Ulnar Deviation  Strengthening;Right;10 reps;Standing    Wrist Ulnar Deviation Limitations  2x10; gripping at end of hammer    Other wrist exercises  R wrist flexion/extension stretch x 30 sec with L hand assistance       Manual Therapy   Manual Therapy   Soft tissue mobilization;Myofascial release    Manual therapy comments  sitting    Soft tissue mobilization  STM to R wrist extensors- mild tenderness and soft tissue restriction on proximal end    Myofascial Release  manual TPR to R proximal wrist extensors             PT Education - 02/02/19 1156    Education Details  update to HEP; advised to perform 4 way wrist exercise with 2#    Person(s) Educated  Patient    Methods  Explanation;Demonstration;Tactile cues;Verbal cues;Handout    Comprehension  Verbalized understanding;Returned demonstration       PT Short Term Goals - 01/19/19 1540      PT SHORT TERM GOAL #1   Title  Patient to be independent with initial HEP.    Time  2    Period  Weeks    Status  Achieved    Target Date  11/27/18        PT Long Term Goals - 01/26/19 1027      PT LONG TERM GOAL #1   Title  Patient to be independent with advanced HEP.    Time  6    Period  Weeks    Status  Partially Met   met for current     PT LONG TERM GOAL #2   Title  Patient to demonstrate B wrist strength >=4+/5.    Time  6    Period  Weeks    Status  On-going      PT LONG TERM GOAL #3   Title  Patient to demonstrate R wrist AROM symmetrical to opposite side and nonpainful.    Time  6    Period  Weeks    Status  Partially Met      PT LONG TERM GOAL #4   Title  Patient to demonstrate R hand grip strength >=20 lbs.    Time  4    Period  Weeks    Status  Achieved   averaged 35.67 lbs on R hand     PT LONG TERM GOAL #5   Title  Patient to demonstrate 3 pinch grip on R hand > 13 lbs in order to improve strength in dominant hand.    Time  6    Period  Weeks    Status  On-going      PT LONG TERM GOAL #6   Title  Patient to report tolerance for reaching with R hand to place bowls on overhead shelf without  pain limiting.    Time  6    Period  Weeks    Status  On-going            Plan - 02/02/19 1157    Clinical Impression Statement  Patient without  new complaints this morning. Worked on progressive wrist strengthening ther-ex this session. Patient tolerated addition of increased weighted resistance with wrist flexion and extension. Required cues to maintain neutral wrist with 3 orientations of weighted elbow flexion. Able to perform wrist radial and ulnar deviation with hammer as weighted resistance without complaints. Ended session with STM and manual TPR to R proximal wrist extensors for relief of soft tissue restriction. Reported understanding of HEP update. No complaints at end of session.    Comorbidities  ostepenia, anemia, anxiety, BPPV, depression, hiatal hernia, HTN, HLD, hypothyroidism    Rehab Potential  Good    PT Treatment/Interventions  ADLs/Self Care Home Management;Cryotherapy;Electrical Stimulation;Moist Heat;Ultrasound;Functional mobility training;Therapeutic activities;Therapeutic exercise;Manual techniques;Patient/family education;Neuromuscular re-education;Passive range of motion;Dry needling;Energy conservation;Splinting;Taping;Vasopneumatic Device    PT Next Visit Plan  Progress wrist and hand strengthening    Consulted and Agree with Plan of Care  Patient       Patient will benefit from skilled therapeutic intervention in order to improve the following deficits and impairments:  Hypomobility, Increased edema, Decreased activity tolerance, Decreased strength, Impaired UE functional use, Pain, Decreased range of motion, Postural dysfunction, Impaired flexibility  Visit Diagnosis: Pain in right wrist  Stiffness of right wrist, not elsewhere classified  Muscle weakness (generalized)  Localized edema     Problem List Patient Active Problem List   Diagnosis Date Noted  . Closed nondisplaced fracture of styloid process of right radius 10/30/2018  . OSA (obstructive sleep apnea) 05/27/2018  . History of hyperparathyroidism 08/24/2014  . Obesity (BMI 30-39.9) 10/08/2013  . HTN (hypertension) 07/13/2013  . Elevated  blood pressure reading without diagnosis of hypertension 06/29/2013  . Osteoporosis 11/14/2012  . Acute iritis of both eyes 10/17/2012  . Thyroid nodule 10/17/2012  . Cardiomegaly 02/20/2012  . DISTURBANCES OF SENSATION OF SMELL AND TASTE 04/19/2010  . Vitamin D deficiency 12/24/2008  . ANXIETY 12/24/2008  . DEPRESSION 12/24/2008  . BENIGN PAROXYSMAL POSITIONAL VERTIGO 12/24/2008  . HYPERGLYCEMIA 12/24/2008  . Nonspecific abnormal electrocardiogram (ECG) (EKG) 12/24/2008  . Hyperlipidemia 07/23/2007  . Hypothyroidism 07/22/2007  . GERD 07/22/2007  . DIVERTICULITIS, HX OF 07/22/2007     Janene Harvey, PT, DPT 02/02/19 12:00 PM   [facility] [address]  [place], [place], [address] Phone: [phone]   Fax:  [phone]  Name: Gail Fuentes MRN: [id number] Date of Birth: 1945/12/31

## 2019-02-06 ENCOUNTER — Telehealth: Payer: Self-pay | Admitting: Internal Medicine

## 2019-02-06 NOTE — Telephone Encounter (Signed)
Prolia injection clear to be scheduled now  No pa Possible copay $ 255

## 2019-02-09 ENCOUNTER — Ambulatory Visit: Payer: Medicare Other | Attending: Family Medicine

## 2019-02-09 ENCOUNTER — Other Ambulatory Visit: Payer: Self-pay

## 2019-02-09 DIAGNOSIS — M25631 Stiffness of right wrist, not elsewhere classified: Secondary | ICD-10-CM | POA: Diagnosis present

## 2019-02-09 DIAGNOSIS — R600 Localized edema: Secondary | ICD-10-CM | POA: Diagnosis present

## 2019-02-09 DIAGNOSIS — M25531 Pain in right wrist: Secondary | ICD-10-CM | POA: Insufficient documentation

## 2019-02-09 DIAGNOSIS — M6281 Muscle weakness (generalized): Secondary | ICD-10-CM | POA: Diagnosis present

## 2019-02-09 NOTE — Therapy (Signed)
[facility] [address]  [place], [place], [address] Phone: [phone]   Fax:  [phone]  Physical Therapy Treatment  Patient Details  Name: Gail Fuentes MRN: [id number] Date of Birth: 1946/01/06 Referring Provider (PT): Clearance Coots, MD   Encounter Date: 02/09/2019  PT End of Session - 02/09/19 0941    Visit Number  9    Number of Visits  12    Date for PT Re-Evaluation  03/02/19    Authorization Type  [employer]HC Medicare    PT Start Time  0931    PT Stop Time  1010    PT Time Calculation (min)  39 min    Activity Tolerance  Patient tolerated treatment well    Behavior During Therapy  [facility] for tasks assessed/performed       Past Medical History:  Diagnosis Date  . Abnormal EKG    per patient- states evaluated by Dr Verl Blalock in the past  . Allergy    sneezing  late Fall  . Anemia 01/23/12   H/H 11.7/35  . Anxiety state, unspecified   . Arthritis   . Benign paroxysmal positional vertigo   . Depression   . Depressive disorder, not elsewhere classified   . Diverticulosis   . Esophageal reflux   . Esophageal stricture   . Gallstones   . Heart murmur   . Hiatal hernia   . History of hyperparathyroidism   . History of UTI   . HTN (hypertension)   . Hyperlipidemia   . OSA (obstructive sleep apnea) 05/27/2018  . Other abnormal glucose   . Personal history of other diseases of digestive system    12-15 since 1990, no recurrence 2006  . PONV (postoperative nausea and vomiting)   . Unspecified hypothyroidism   . Vitamin D deficiency     Past Surgical History:  Procedure Laterality Date  . [place]  . CHOLECYSTECTOMY  2004  . COLONOSCOPY  12/2003   Tics  . DENTAL SURGERY  1962, 2012   Age 73-Abscessed Teeth Extracted   . PARATHYROIDECTOMY Left 02/13/2013   Procedure: LEFT INFERIOR PARATHYROIDECTOMY with frozen section ;  Surgeon: Earnstine Regal, MD;  Location: [facility];  Service: General;   Laterality: Left;  . TOTAL ABDOMINAL HYSTERECTOMY W/ BILATERAL SALPINGOOPHORECTOMY  2005   Ovarian Cyst    There were no vitals filed for this visit.  Subjective Assessment - 02/09/19 0937    Subjective  Pt. reporting she, "overdid on Saturday, taxing my arm, with vacuuming".    Pertinent History  anemia, anxiety, BPPV, depression, hiatal hernia, HTN, HLD, hypothyroidism    Diagnostic tests  12/25/18 R wrist xray: Unchanged appearance of nondisplaced, intra-articular fracture    Patient Stated Goals  "to feel that this is healed and to return to my normal activities"    Currently in Pain?  Yes    Pain Score  3     Pain Location  Shoulder    Pain Orientation  Right    Pain Descriptors / Indicators  Dull    Pain Type  Acute pain    Aggravating Factors   vacuuming                       [facility] Adult PT Treatment/Exercise - 02/09/19 0001      Elbow Exercises   Forearm Supination  Strengthening;Right;10 reps;Standing    Forearm Supination Limitations  end grip on hammer  Forearm Pronation  Strengthening;Right;10 reps;Standing    Forearm Pronation Limitations  end grip on hammer      Shoulder Exercises: ROM/Strengthening   UBE (Upper Arm Bike)  lvl 1.5, 3 min forwards/ 3 min backwards      Hand Exercises   Other Hand Exercises  R stovell grip squeezer (yellow spring 15#) x 20 reps     Other Hand Exercises  " Bolt Box" x 5 bolts (unscrewing/tightening) with R hand x 5 rpes       Wrist Exercises   Wrist Flexion  AROM;Right;Seated;10 reps    Bar Weights/Barbell (Wrist Flexion)  2 lbs    Wrist Extension  AROM;Right;Seated;10 reps    Bar Weights/Barbell (Wrist Extension)  2 lbs    Wrist Radial Deviation  Strengthening;Right;Standing   x 12 reps    Wrist Radial Deviation Limitations  gripping hammer at end of grip     Wrist Ulnar Deviation  Strengthening;Right;Standing   x 12 reps    Wrist Ulnar Deviation Limitations  gripping hammer at end grip     Other wrist  exercises  R wrist flexion/extension stretch x 30 sec with L hand assistance                PT Short Term Goals - 01/19/19 1540      PT SHORT TERM GOAL #1   Title  Patient to be independent with initial HEP.    Time  2    Period  Weeks    Status  Achieved    Target Date  11/27/18        PT Long Term Goals - 01/26/19 1027      PT LONG TERM GOAL #1   Title  Patient to be independent with advanced HEP.    Time  6    Period  Weeks    Status  Partially Met   met for current     PT LONG TERM GOAL #2   Title  Patient to demonstrate B wrist strength >=4+/5.    Time  6    Period  Weeks    Status  On-going      PT LONG TERM GOAL #3   Title  Patient to demonstrate R wrist AROM symmetrical to opposite side and nonpainful.    Time  6    Period  Weeks    Status  Partially Met      PT LONG TERM GOAL #4   Title  Patient to demonstrate R hand grip strength >=20 lbs.    Time  4    Period  Weeks    Status  Achieved   averaged 35.67 lbs on R hand     PT LONG TERM GOAL #5   Title  Patient to demonstrate 3 pinch grip on R hand > 13 lbs in order to improve strength in dominant hand.    Time  6    Period  Weeks    Status  On-going      PT LONG TERM GOAL #6   Title  Patient to report tolerance for reaching with R hand to place bowls on overhead shelf without pain limiting.    Time  6    Period  Weeks    Status  On-going            Plan - 02/09/19 0941    Clinical Impression Statement  Pt. doing well today.  "overdid it a bit Saturday" while vacuuming furniture using R UE however feels  this has recovered.  Tolerated progression of wrist, forearm, hand strengthening activities well today with addition of "bolt-box" wrist and hand strengthening.  Ended visit with pt. reporting she was pain-free thus modalities deferred.  Pt. progressing toward LTG #5.    Personal Factors and Comorbidities  Age;Comorbidity 3+;Time since onset of  injury/illness/exacerbation;Fitness;Past/Current Experience    Comorbidities  ostepenia, anemia, anxiety, BPPV, depression, hiatal hernia, HTN, HLD, hypothyroidism    Rehab Potential  Good    PT Treatment/Interventions  ADLs/Self Care Home Management;Cryotherapy;Electrical Stimulation;Moist Heat;Ultrasound;Functional mobility training;Therapeutic activities;Therapeutic exercise;Manual techniques;Patient/family education;Neuromuscular re-education;Passive range of motion;Dry needling;Energy conservation;Splinting;Taping;Vasopneumatic Device    PT Next Visit Plan  Progress wrist and hand strengthening    Consulted and Agree with Plan of Care  Patient       Patient will benefit from skilled therapeutic intervention in order to improve the following deficits and impairments:     Visit Diagnosis: Pain in right wrist  Stiffness of right wrist, not elsewhere classified  Muscle weakness (generalized)  Localized edema     Problem List Patient Active Problem List   Diagnosis Date Noted  . Closed nondisplaced fracture of styloid process of right radius 10/30/2018  . OSA (obstructive sleep apnea) 05/27/2018  . History of hyperparathyroidism 08/24/2014  . Obesity (BMI 30-39.9) 10/08/2013  . HTN (hypertension) 07/13/2013  . Elevated blood pressure reading without diagnosis of hypertension 06/29/2013  . Osteoporosis 11/14/2012  . Acute iritis of both eyes 10/17/2012  . Thyroid nodule 10/17/2012  . Cardiomegaly 02/20/2012  . DISTURBANCES OF SENSATION OF SMELL AND TASTE 04/19/2010  . Vitamin D deficiency 12/24/2008  . ANXIETY 12/24/2008  . DEPRESSION 12/24/2008  . BENIGN PAROXYSMAL POSITIONAL VERTIGO 12/24/2008  . HYPERGLYCEMIA 12/24/2008  . Nonspecific abnormal electrocardiogram (ECG) (EKG) 12/24/2008  . Hyperlipidemia 07/23/2007  . Hypothyroidism 07/22/2007  . GERD 07/22/2007  . DIVERTICULITIS, HX OF 07/22/2007    Bess Harvest, PTA 02/09/19 12:41 PM   [facility] [address]  [place], [place], [address] Phone: [phone]   Fax:  [phone]  Name: ATHALIA SETTERLUND MRN: [id number] Date of Birth: 1945-11-13

## 2019-02-16 ENCOUNTER — Other Ambulatory Visit: Payer: Self-pay

## 2019-02-16 ENCOUNTER — Ambulatory Visit: Payer: Medicare Other | Admitting: Physical Therapy

## 2019-02-16 ENCOUNTER — Encounter: Payer: Self-pay | Admitting: Physical Therapy

## 2019-02-16 DIAGNOSIS — M25531 Pain in right wrist: Secondary | ICD-10-CM

## 2019-02-16 DIAGNOSIS — M6281 Muscle weakness (generalized): Secondary | ICD-10-CM

## 2019-02-16 DIAGNOSIS — R600 Localized edema: Secondary | ICD-10-CM

## 2019-02-16 DIAGNOSIS — M25631 Stiffness of right wrist, not elsewhere classified: Secondary | ICD-10-CM

## 2019-02-16 NOTE — Therapy (Addendum)
[facility] [address]  [place], [place], [address] Phone: [phone]   Fax:  [phone]  Physical Therapy Progress Note  Patient Details  Name: Gail Fuentes MRN: [id number] Date of Birth: 1946/04/24 Referring Provider (PT): Clearance Coots, MD   Progress Note Reporting Period 01/18/09 to 02/16/19  See note below for Objective Data and Assessment of Progress/Goals.     Encounter Date: 02/16/2019  PT End of Session - 02/16/19 1055    Visit Number  10    Number of Visits  12    Date for PT Re-Evaluation  03/02/19    Authorization Type  [employer] Medicare    PT Start Time  1016    PT Stop Time  1051    PT Time Calculation (min)  35 min    Activity Tolerance  Patient tolerated treatment well;Patient limited by pain    Behavior During Therapy  WFL for tasks assessed/performed       Past Medical History:  Diagnosis Date  . Abnormal EKG    per patient- states evaluated by Dr Verl Blalock in the past  . Allergy    sneezing  late Fall  . Anemia 01/23/12   H/H 11.7/35  . Anxiety state, unspecified   . Arthritis   . Benign paroxysmal positional vertigo   . Depression   . Depressive disorder, not elsewhere classified   . Diverticulosis   . Esophageal reflux   . Esophageal stricture   . Gallstones   . Heart murmur   . Hiatal hernia   . History of hyperparathyroidism   . History of UTI   . HTN (hypertension)   . Hyperlipidemia   . OSA (obstructive sleep apnea) 05/27/2018  . Other abnormal glucose   . Personal history of other diseases of digestive system    12-15 since 1990, no recurrence 2006  . PONV (postoperative nausea and vomiting)   . Unspecified hypothyroidism   . Vitamin D deficiency     Past Surgical History:  Procedure Laterality Date  . [place]  . CHOLECYSTECTOMY  2004  . COLONOSCOPY  12/2003   Tics  . DENTAL SURGERY  1962, 2012   Age 3-Abscessed Teeth Extracted   .  PARATHYROIDECTOMY Left 02/13/2013   Procedure: LEFT INFERIOR PARATHYROIDECTOMY with frozen section ;  Surgeon: Earnstine Regal, MD;  Location: [facility];  Service: General;  Laterality: Left;  . TOTAL ABDOMINAL HYSTERECTOMY W/ BILATERAL SALPINGOOPHORECTOMY  2005   Ovarian Cyst    There were no vitals filed for this visit.  Subjective Assessment - 02/16/19 1018    Subjective  She is beginning to think that the wrist is about as far as its gonna get. Still having discomfort with brushing teeth and lifting a pot. Sometimes her L wrist hurts too. Reports 80-85% improvement in R wrist.    Pertinent History  anemia, anxiety, BPPV, depression, hiatal hernia, HTN, HLD, hypothyroidism    Diagnostic tests  12/25/18 R wrist xray: Unchanged appearance of nondisplaced, intra-articular fracture    Patient Stated Goals  "to feel that this is healed and to return to my normal activities"    Currently in Pain?  No/denies         [facility] PT Assessment - 02/16/19 0001      Observation/Other Assessments   Focus on Therapeutic Outcomes (FOTO)   Wrist: 69 (31% limited, 30% predicted)      AROM   Right Wrist Extension  65  Degrees    Right Wrist Flexion  82 Degrees    Right Wrist Radial Deviation  21 Degrees    Right Wrist Ulnar Deviation  32 Degrees      Strength   Right Wrist Flexion  4+/5    Right Wrist Extension  4+/5    Right Wrist Radial Deviation  4+/5   mild pain   Right Wrist Ulnar Deviation  4+/5    Right Hand 3 Point Pinch  13.5 lbs   13, 14, 13.5                   [facility] Adult PT Treatment/Exercise - 02/16/19 0001      Shoulder Exercises: ROM/Strengthening   UBE (Upper Arm Bike)  lvl 1.5, 3 min forwards/ 3 min backwards             PT Education - 02/16/19 1055    Education Details  update to HEP; discussion on objective progress with PT    Person(s) Educated  Patient    Methods  Explanation;Demonstration;Tactile cues;Verbal cues;Handout    Comprehension  Verbalized  understanding;Returned demonstration       PT Short Term Goals - 02/16/19 1024      PT SHORT TERM GOAL #1   Title  Patient to be independent with initial HEP.    Time  2    Period  Weeks    Status  Achieved    Target Date  11/27/18        PT Long Term Goals - 02/16/19 1024      PT LONG TERM GOAL #1   Title  Patient to be independent with advanced HEP.    Time  6    Period  Weeks    Status  Achieved   admits to limited compliance     PT LONG TERM GOAL #2   Title  Patient to demonstrate B wrist strength >=4+/5.    Time  6    Period  Weeks    Status  Achieved      PT LONG TERM GOAL #3   Title  Patient to demonstrate R wrist AROM symmetrical to opposite side and nonpainful.    Time  6    Period  Weeks    Status  Partially Met   limited in R wrist extension     PT LONG TERM GOAL #4   Title  Patient to demonstrate R hand grip strength >=20 lbs.    Time  4    Period  Weeks    Status  Achieved   averaged 35.67 lbs on R hand     PT LONG TERM GOAL #5   Title  Patient to demonstrate 3 pinch grip on R hand > 13 lbs in order to improve strength in dominant hand.    Time  6    Period  Weeks    Status  Achieved   13.5 lbs     PT LONG TERM GOAL #6   Title  Patient to report tolerance for reaching with R hand to place bowls on overhead shelf without pain limiting.    Time  6    Period  Weeks    Status  Achieved            Plan - 02/16/19 1215    Clinical Impression Statement  Patient reporting 80-85% improvement in R wrist since initial eval. Still noting discomfort with ADLs such as brushing teeth and lifting a pot. Patient has now  achieved wrist strength goal, with all muscle groups atleast 4+/5. Patient has demonstrated improvement in R wrist extension and radial deviation AROM, with wrist extension most limited in wrist extension. Patient has also met 3 pinch grip goal, with R hand reaching 13.5 lbs at this time. Patient notes that she is now able to reach  overhead to place dishes on overhead shelf at this time, with only remaining weakness with lifting a heavy pot. Updated HEP with exercises to help patient simulate lifting pot- patient reported understanding and with no concerns at end of session. Patient has demonstrated good progress with PT thus far, thus placing patient on 30 day hold for continued performance of HEP in order to return completely to PLOF.    Personal Factors and Comorbidities  Age;Comorbidity 3+;Time since onset of injury/illness/exacerbation;Fitness;Past/Current Experience    Comorbidities  ostepenia, anemia, anxiety, BPPV, depression, hiatal hernia, HTN, HLD, hypothyroidism    Rehab Potential  Good    PT Treatment/Interventions  ADLs/Self Care Home Management;Cryotherapy;Electrical Stimulation;Moist Heat;Ultrasound;Functional mobility training;Therapeutic activities;Therapeutic exercise;Manual techniques;Patient/family education;Neuromuscular re-education;Passive range of motion;Dry needling;Energy conservation;Splinting;Taping;Vasopneumatic Device    PT Next Visit Plan  30 day hold at this time    Consulted and Agree with Plan of Care  Patient       Patient will benefit from skilled therapeutic intervention in order to improve the following deficits and impairments:     Visit Diagnosis: Pain in right wrist  Stiffness of right wrist, not elsewhere classified  Muscle weakness (generalized)  Localized edema     Problem List Patient Active Problem List   Diagnosis Date Noted  . Closed nondisplaced fracture of styloid process of right radius 10/30/2018  . OSA (obstructive sleep apnea) 05/27/2018  . History of hyperparathyroidism 08/24/2014  . Obesity (BMI 30-39.9) 10/08/2013  . HTN (hypertension) 07/13/2013  . Elevated blood pressure reading without diagnosis of hypertension 06/29/2013  . Osteoporosis 11/14/2012  . Acute iritis of both eyes 10/17/2012  . Thyroid nodule 10/17/2012  . Cardiomegaly 02/20/2012  .  DISTURBANCES OF SENSATION OF SMELL AND TASTE 04/19/2010  . Vitamin D deficiency 12/24/2008  . ANXIETY 12/24/2008  . DEPRESSION 12/24/2008  . BENIGN PAROXYSMAL POSITIONAL VERTIGO 12/24/2008  . HYPERGLYCEMIA 12/24/2008  . Nonspecific abnormal electrocardiogram (ECG) (EKG) 12/24/2008  . Hyperlipidemia 07/23/2007  . Hypothyroidism 07/22/2007  . GERD 07/22/2007  . DIVERTICULITIS, HX OF 07/22/2007     Janene Harvey, PT, DPT 02/16/19 12:19 PM   [facility] [address]  [place], [place], [address] Phone: [phone]   Fax:  [phone]  Name: Gail Fuentes MRN: [id number] Date of Birth: 06-30-1945  PHYSICAL THERAPY DISCHARGE SUMMARY  Visits from Start of Care: 10  Current functional level related to goals / functional outcomes: See above clinical impression; patient did not return after 30 dya hold   Remaining deficits: Decreased wrist ROM   Education / Equipment: HEP  Plan: Patient agrees to discharge.  Patient goals were partially met. Patient is being discharged due to being pleased with the current functional level.  ?????     Janene Harvey, PT, DPT 03/30/19 1:55 PM

## 2019-02-23 ENCOUNTER — Encounter: Payer: Medicare Other | Admitting: Physical Therapy

## 2019-02-27 ENCOUNTER — Telehealth: Payer: Self-pay

## 2019-02-27 NOTE — Telephone Encounter (Signed)
Spoke with patients husband and he stated he would ask patient to call us back to schedule nurse visit for Prolia- she owes $255 at check-in and is ready to be scheduled anytime

## 2019-03-02 ENCOUNTER — Encounter: Payer: Medicare Other | Admitting: Physical Therapy

## 2019-03-11 NOTE — Telephone Encounter (Signed)
Spoke to the patient today and she has not decided of she wants to take Prolia or not-patient read in a pamphlet that if you have had any thyroid or parathyroid problems you should not take Prolia so she is worried about taking it-patient would like a better understanding of the adverse affects of Prolia are and if she should even try it-please advise

## 2019-03-11 NOTE — Telephone Encounter (Signed)
Spoke with the patient and gave her MD advise-patient she will consider it and let us know what she decides

## 2019-03-11 NOTE — Telephone Encounter (Signed)
We discussed at length at last visit about this and I would not have suggested if I did not think that she would benefit from the medication. The risk for atypical fractures and osteonecrosis of the jaw is very low and these usually happen at higher doses of Prolia taken closer together, for example in cancer patients.  Otherwise, the benefit of fracture protection greatly outweighs the risk of potential complications. There is no contraindication for Prolia for parathyroid or thyroid disorders. Ty, C

## 2019-03-17 ENCOUNTER — Encounter: Payer: Self-pay | Admitting: Family Medicine

## 2019-03-17 ENCOUNTER — Ambulatory Visit (INDEPENDENT_AMBULATORY_CARE_PROVIDER_SITE_OTHER): Payer: Medicare Other | Admitting: Family Medicine

## 2019-03-17 ENCOUNTER — Other Ambulatory Visit: Payer: Self-pay

## 2019-03-17 DIAGNOSIS — M79671 Pain in right foot: Secondary | ICD-10-CM

## 2019-03-17 MED ORDER — PREDNISONE 20 MG PO TABS: 40.0000 mg | ORAL_TABLET | Freq: Every day | ORAL | 0 refills | Status: AC

## 2019-03-17 NOTE — Progress Notes (Signed)
Musculoskeletal Exam  Patient: Gail Fuentes DOB: [birth year]  DOS: 03/17/2019  SUBJECTIVE:  Chief Complaint:   Chief Complaint  Patient presents with  . Foot Pain    right-- third day---uses ice and helps.  . Ankle Pain    Gail Fuentes is a 73 y.o.  female for evaluation and treatment of R heel pain. Due to COVID-19 pandemic, we are interacting via telephone. I verified patient's ID using 2 identifiers. Patient agreed to proceed with visit via this method. Patient is at home, I am at office. Patient and I are present for visit.   Onset:  4 days ago. No inj. Was on her feet more than usually just prior.  Location: R heel Character:  aching and sharp  Progression of issue:  is unchanged Associated symptoms: some swelling, no redness or bruising Treatment: to date has been ice and acetaminophen.   Neurovascular symptoms: no  ROS: Musculoskeletal/Extremities: +R heel pain  Past Medical History:  Diagnosis Date  . Abnormal EKG    per patient- states evaluated by Dr Verl Blalock in the past  . Allergy    sneezing  late Fall  . Anemia 01/23/12   H/H 11.7/35  . Anxiety state, unspecified   . Arthritis   . Benign paroxysmal positional vertigo   . Depression   . Depressive disorder, not elsewhere classified   . Diverticulosis   . Esophageal reflux   . Esophageal stricture   . Gallstones   . Heart murmur   . Hiatal hernia   . History of hyperparathyroidism   . History of UTI   . HTN (hypertension)   . Hyperlipidemia   . OSA (obstructive sleep apnea) 05/27/2018  . Other abnormal glucose   . Personal history of other diseases of digestive system    12-15 since 1990, no recurrence 2006  . PONV (postoperative nausea and vomiting)   . Unspecified hypothyroidism   . Vitamin D deficiency     Objective: No conversational dyspnea Age appropriate judgment and insight Nml affect and mood  Assessment:  Pain of right heel - Plan: predniSONE (DELTASONE) 20 MG  tablet  Plan: Orders as above. Question gout? Likely overuse though. Will tx as above, keep walker boot in mind, ice, Tylenol. Achilles stretches.  Total time: 14 min F/u prn. The patient voiced understanding and agreement to the plan.   [place], DO 03/17/19  12:58 PM

## 2019-05-11 ENCOUNTER — Ambulatory Visit: Payer: Medicare Other | Attending: Internal Medicine

## 2019-05-11 ENCOUNTER — Other Ambulatory Visit: Payer: Medicare Other

## 2019-05-11 DIAGNOSIS — Z20822 Contact with and (suspected) exposure to covid-19: Secondary | ICD-10-CM

## 2019-05-12 LAB — NOVEL CORONAVIRUS, NAA: SARS-CoV-2, NAA: NOT DETECTED

## 2019-05-13 NOTE — Progress Notes (Signed)
Chief Complaint  Patient presents with  . Follow-up    hypertensive heart disease   History of Present Illness: 74 yo female with history of HTN, HLD, GERD, anxiety, depression and hyperparathyroidism who is here today for cardiac follow up. She was followed remotely in our office by Dr. Verl Blalock for an abnormal EKG. Echo 2011 with normal LV function, no valve issues. I met her in January 2018. She reported occasional dyspnea. Her EKG showed poor R wave progression in the precordial leads and possible inferior Q waves, all unchanged from 2009. Echo February 2018 with normal LV systolic function, grade 1 diastolic dysfunction, mild LVH, no valve disease.   She is here today for follow up. The patient denies any chest pain, dyspnea, palpitations, lower extremity edema, orthopnea, PND, dizziness, near syncope or syncope.   Primary Care Physician: Darreld Mclean, MD  Past Medical History:  Diagnosis Date  . Abnormal EKG    per patient- states evaluated by Dr Verl Blalock in the past  . Allergy    sneezing  late Fall  . Anemia 01/23/12   H/H 11.7/35  . Anxiety state, unspecified   . Arthritis   . Benign paroxysmal positional vertigo   . Depression   . Depressive disorder, not elsewhere classified   . Diverticulosis   . Esophageal reflux   . Esophageal stricture   . Gallstones   . Heart murmur   . Hiatal hernia   . History of hyperparathyroidism   . History of UTI   . HTN (hypertension)   . Hyperlipidemia   . OSA (obstructive sleep apnea) 05/27/2018  . Other abnormal glucose   . Personal history of other diseases of digestive system    12-15 since 1990, no recurrence 2006  . PONV (postoperative nausea and vomiting)   . Unspecified hypothyroidism   . Vitamin D deficiency     Past Surgical History:  Procedure Laterality Date  . [place]  . CHOLECYSTECTOMY  2004  . COLONOSCOPY  12/2003   Tics  . DENTAL SURGERY  1962, 2012   Age 57-Abscessed Teeth Extracted   .  PARATHYROIDECTOMY Left 02/13/2013   Procedure: LEFT INFERIOR PARATHYROIDECTOMY with frozen section ;  Surgeon: Earnstine Regal, MD;  Location: [facility];  Service: General;  Laterality: Left;  . TOTAL ABDOMINAL HYSTERECTOMY W/ BILATERAL SALPINGOOPHORECTOMY  2005   Ovarian Cyst    Current Outpatient Medications  Medication Sig Dispense Refill  . ALPRAZolam (XANAX) 0.5 MG tablet Take 0.5 mg by mouth 3 (three) times daily as needed for anxiety.     . Cholecalciferol (VITAMIN D3) 2000 UNITS TABS Take 2 tablets by mouth daily.     . cyanocobalamin 100 MCG tablet Take 100 mcg by mouth daily.    . fexofenadine (ALLEGRA) 180 MG tablet Take 180 mg by mouth as needed for allergies. Reported on 07/18/2015    . fluticasone (FLONASE) 50 MCG/ACT nasal spray Place 1 spray into both nostrils daily. 16 g 2  . hydrochlorothiazide (MICROZIDE) 12.5 MG capsule Take 1 capsule (12.5 mg total) by mouth daily. 90 capsule 2  . losartan (COZAAR) 25 MG tablet TAKE 2 TABLETS BY MOUTH EVERY DAY 60 tablet 11  . Multiple Vitamins-Minerals (ZINC PO) Take 1 tablet by mouth daily.    [name] omeprazole (PRILOSEC) 20 MG capsule TAKE 1 CAPSULE DAILY 90 capsule 1  . SYNTHROID 100 MCG tablet TAKE ONE TABLET DAILY AND TAKE ONE-HALF (1/2) TABLET ON SUNDAY, 90 tablet 3  No current facility-administered medications for this visit.    Allergies  Allergen Reactions  . Augmentin [Amoxicillin-Pot Clavulanate] Rash    Social History   Socioeconomic History  . Marital status: Married    Spouse name: Not on file  . Number of children: 1  . Years of education: Not on file  . Highest education level: Not on file  Occupational History  . Occupation: Administrator, [employer] and homes    Employer: RETIRED  . Occupation: retired Air cabin crew  Tobacco Use  . Smoking status: Former Smoker    Quit date: 05/07/1973    Years since quitting: 46.0  . Smokeless tobacco: Never Used  . Tobacco comment: Smoked 1968-1975 up to 2 ppd  Substance and Sexual  Activity  . Alcohol use: Yes    Alcohol/week: 14.0 standard drinks    Types: 14 Standard drinks or equivalent per week    Comment: 2 nightly wines  . Drug use: No  . Sexual activity: Not Currently  Other Topics Concern  . Not on file  Social History Narrative   Regular exercise: no   Caffeine use: daily   Social Determinants of Health   Financial Resource Strain:   . Difficulty of Paying Living Expenses: Not on file  Food Insecurity:   . Worried About Charity fundraiser in the Last Year: Not on file  . Ran Out of Food in the Last Year: Not on file  Transportation Needs:   . Lack of Transportation (Medical): Not on file  . Lack of Transportation (Non-Medical): Not on file  Physical Activity:   . Days of Exercise per Week: Not on file  . Minutes of Exercise per Session: Not on file  Stress:   . Feeling of Stress : Not on file  Social Connections:   . Frequency of Communication with Friends and Family: Not on file  . Frequency of Social Gatherings with Friends and Family: Not on file  . Attends Religious Services: Not on file  . Active Member of Clubs or Organizations: Not on file  . Attends Archivist Meetings: Not on file  . Marital Status: Not on file  Intimate Partner Violence:   . Fear of Current or Ex-Partner: Not on file  . Emotionally Abused: Not on file  . Physically Abused: Not on file  . Sexually Abused: Not on file    Family History  Problem Relation Age of Onset  . Coronary artery disease Father   . Heart attack Father 32  . COPD Father   . Diabetes Mother   . Dysrhythmia Mother   . Depression Mother   . COPD Mother   . Colon polyps Mother   . Kidney disease Mother   . Prostate cancer Brother   . Other Brother        Wegener's in sinus; [place]  . Heart disease Brother   . Arthritis Brother   . Hypertension Brother   . Prostate cancer Brother   . Cancer Brother        Soft tissue in the face/temple  . Stroke Maternal Aunt        >65   . Stroke Maternal Grandmother        >65  . Stroke Maternal Grandfather 47    Review of Systems:  As stated in the HPI and otherwise negative.   BP 132/74   Pulse 70   Ht 5' 5" (1.651 m)   Wt 207 lb 6.4 oz (94.1 kg)  SpO2 94%   BMI 34.51 kg/m   Physical Examination:  General: Well developed, well nourished, NAD  HEENT: OP clear, mucus membranes moist  SKIN: warm, dry. No rashes. Neuro: No focal deficits  Musculoskeletal: Muscle strength 5/5 all ext  Psychiatric: Mood and affect normal  Neck: No JVD, no carotid bruits, no thyromegaly, no lymphadenopathy.  Lungs:Clear bilaterally, no wheezes, rhonci, crackles Cardiovascular: Regular rate and rhythm. No murmurs, gallops or rubs. Abdomen:Soft. Bowel sounds present. Non-tender.  Extremities: No lower extremity edema. Pulses are 2 + in the bilateral DP/PT.  Echo February 2018:  Left ventricle: The cavity size was normal. Wall thickness was   increased in a pattern of mild LVH. Systolic function was normal.   The estimated ejection fraction was in the range of 60% to 65%.   Wall motion was normal; there were no regional wall motion   abnormalities. Doppler parameters are consistent with abnormal   left ventricular relaxation (grade 1 diastolic dysfunction).   Doppler parameters are consistent with high ventricular filling   pressure.  Impressions:  - Normal LV systolic function; grade 1 diastolic dysfunction with   elevated LV filling pressure; mild LVH.  EKG:  EKG is ordered today. The ekg ordered today demonstrates   Recent Labs: 11/20/2018: BUN 9; Creat 0.76; Potassium 4.2; Sodium 138; TSH 1.19 12/17/2018: Hemoglobin 14.0; Platelets 298.0   Lipid Panel    Component Value Date/Time   CHOL 232 (H) 12/17/2018 1158   CHOL 236 (H) 10/12/2014 1046   TRIG 159.0 (H) 12/17/2018 1158   TRIG 109 10/12/2014 1046   TRIG 91 05/13/2006 1031   HDL 79.50 12/17/2018 1158   HDL 90 10/12/2014 1046   CHOLHDL 3 12/17/2018 1158    VLDL 31.8 12/17/2018 1158   LDLCALC 121 (H) 12/17/2018 1158   LDLCALC 124 (H) 10/12/2014 1046   LDLDIRECT 142.7 12/27/2008 1035     Wt Readings from Last 3 Encounters:  05/14/19 207 lb 6.4 oz (94.1 kg)  12/25/18 207 lb (93.9 kg)  12/17/18 209 lb (94.8 kg)     Other studies Reviewed: Additional studies/ records that were reviewed today include: . Review of the above records demonstrates:   Assessment and Plan:   1. Hypertensive heart disease: Echo February 2018 with mild LVH, grade 1 diastolic dysfunction. BP is well controlled. Continue current medical therapy.      Current medicines are reviewed at length with the patient today.  The patient does not have concerns regarding medicines.  The following changes have been made:  no change  Labs/ tests ordered today include:   Orders Placed This Encounter  Procedures  . EKG 12-Lead    Disposition:   FU with me in 12  months  Signed, Lauree Chandler, Jerilynn Mages 05/14/2019 3:07 PM    [facility] [place], [place], [place]  [address] Phone: [phone]; Fax: [phone]

## 2019-05-14 ENCOUNTER — Other Ambulatory Visit: Payer: Self-pay

## 2019-05-14 ENCOUNTER — Ambulatory Visit (INDEPENDENT_AMBULATORY_CARE_PROVIDER_SITE_OTHER): Payer: Medicare Other | Admitting: Cardiovascular Disease

## 2019-05-14 ENCOUNTER — Encounter: Payer: Self-pay | Admitting: Cardiovascular Disease

## 2019-05-14 VITALS — BP 132/74 | HR 70 | Ht 65.0 in | Wt 207.4 lb

## 2019-05-14 DIAGNOSIS — I119 Hypertensive heart disease without heart failure: Secondary | ICD-10-CM

## 2019-05-14 NOTE — Patient Instructions (Signed)

## 2019-06-15 ENCOUNTER — Ambulatory Visit: Payer: Self-pay

## 2019-06-25 ENCOUNTER — Other Ambulatory Visit: Payer: Self-pay | Admitting: Family Medicine

## 2019-09-15 NOTE — Progress Notes (Addendum)
[facility] [address], [address] [place], [place] [address] [phone]  Date:  09/16/2019   Name:  Gail Fuentes   DOB:  07-11-1945   MRN:  [id number]  PCP:  Pearline Cables, MD for for things really seems to pretty better to give you a safe   Chief Complaint: Depression   History of Present Illness:  Gail Fuentes is a 74 y.o. very pleasant female patient who presents with the following:  Patient here today for a follow-up visit.  History of hypertension, sleep apnea, hypothyroidism, osteoporosis, hyperlipidemia, vitamin D deficiency, status post parathyroidectomy  Last seen by myself in August At that time her husband had recently retired and was home with her all the time, her young teen grandson was also into emotional problems including OCD and anxiety, depression He is treated with zoloft and is doing pretty ok Her son remarried and brought 2 new grands into the family  In August 2020 she was taking Prozac, she had been taking Prozac for many years but felt it was not working as well as it had previously.  We tapered her off this and started Wellbutrin instead She uses this for a week or so but had increased anxiety- she stopped using it, not taking anything now.  She has not seen her therapist in a few months due to insurance not covering and virtual visit difficulties.  She has an appt for phone appt in June  Pt notes that she is struggling with her depression, she is having crying spells, feeling angry and irritable.  Her husband has noticed a change in her, he is also having some health problems recently She is having difficulty sleeping now- she may not sleep at all some of the time, but she is very tired.  She is ruminating and cannot get her mind to calm down  She has lost 5 friends over the last 6 months She finds herself thinking about death, but no SI  She saw her cardiologist, Dr. Clifton James in  January.  He did not make any changes, plan 1 year follow-up  HCTZ 12.5 Losartan 25 Omeprazole Synthroid 100  COVID-19 vaccine- done  Mammogram 2019- will we Dr Jennette Kettle  Colonoscopy up-to-date DEXA 2020  Due for complete labs- will do today for her   Quit smoking about 40 years ago She is drinking some alcohol- often 2 glasses of wine daily but this can make her GERD worse.  She sometimes has up to 3 glasses of wine a day, sometimes none Patient Active Problem List   Diagnosis Date Noted  . Closed nondisplaced fracture of styloid process of right radius 10/30/2018  . OSA (obstructive sleep apnea) 05/27/2018  . History of hyperparathyroidism 08/24/2014  . Obesity (BMI 30-39.9) 10/08/2013  . HTN (hypertension) 07/13/2013  . Elevated blood pressure reading without diagnosis of hypertension 06/29/2013  . Osteoporosis 11/14/2012  . Acute iritis of both eyes 10/17/2012  . Thyroid nodule 10/17/2012  . Cardiomegaly 02/20/2012  . DISTURBANCES OF SENSATION OF SMELL AND TASTE 04/19/2010  . Vitamin D deficiency 12/24/2008  . ANXIETY 12/24/2008  . DEPRESSION 12/24/2008  . BENIGN PAROXYSMAL POSITIONAL VERTIGO 12/24/2008  . HYPERGLYCEMIA 12/24/2008  . Nonspecific abnormal electrocardiogram (ECG) (EKG) 12/24/2008  . Hyperlipidemia 07/23/2007  . Hypothyroidism 07/22/2007  . GERD 07/22/2007  . DIVERTICULITIS, HX OF 07/22/2007    Past Medical History:  Diagnosis Date  . Abnormal EKG    per patient-  states evaluated by Dr Daleen Squibb in the past  . Allergy    sneezing  late Fall  . Anemia 01/23/12   H/H 11.7/35  . Anxiety state, unspecified   . Arthritis   . Benign paroxysmal positional vertigo   . Depression   . Depressive disorder, not elsewhere classified   . Diverticulosis   . Esophageal reflux   . Esophageal stricture   . Gallstones   . Heart murmur   . Hiatal hernia   . History of hyperparathyroidism   . History of UTI   . HTN (hypertension)   . Hyperlipidemia   . OSA  (obstructive sleep apnea) 05/27/2018  . Other abnormal glucose   . Personal history of other diseases of digestive system    12-15 since 1990, no recurrence 2006  . PONV (postoperative nausea and vomiting)   . Unspecified hypothyroidism   . Vitamin D deficiency     Past Surgical History:  Procedure Laterality Date  . CESAREAN SECTION  1977  . CHOLECYSTECTOMY  2004  . COLONOSCOPY  12/2003   Tics  . DENTAL SURGERY  1962, 2012   Age 62-Abscessed Teeth Extracted   . PARATHYROIDECTOMY Left 02/13/2013   Procedure: LEFT INFERIOR PARATHYROIDECTOMY with frozen section ;  Surgeon: Velora Heckler, MD;  Location: [facility];  Service: General;  Laterality: Left;  . TOTAL ABDOMINAL HYSTERECTOMY W/ BILATERAL SALPINGOOPHORECTOMY  2005   Ovarian Cyst    Social History   Tobacco Use  . Smoking status: Former Smoker    Quit date: 05/07/1973    Years since quitting: 46.3  . Smokeless tobacco: Never Used  . Tobacco comment: Smoked 1968-1975 up to 2 ppd  Substance Use Topics  . Alcohol use: Yes    Alcohol/week: 14.0 standard drinks    Types: 14 Standard drinks or equivalent per week    Comment: 2 nightly wines  . Drug use: No    Family History  Problem Relation Age of Onset  . Coronary artery disease Father   . Heart attack Father 41  . COPD Father   . Diabetes Mother   . Dysrhythmia Mother   . Depression Mother   . COPD Mother   . Colon polyps Mother   . Kidney disease Mother   . Prostate cancer Brother   . Other Brother        Wegener's in sinus; [facility]  . Heart disease Brother   . Arthritis Brother   . Hypertension Brother   . Prostate cancer Brother   . Cancer Brother        Soft tissue in the face/temple  . Stroke Maternal Aunt        >65  . Stroke Maternal Grandmother        >65  . Stroke Maternal Grandfather 47    Allergies  Allergen Reactions  . Augmentin [Amoxicillin-Pot Clavulanate] Rash    Medication list has been reviewed and updated.  Current Outpatient  Medications on File Prior to Visit  Medication Sig Dispense Refill  . ALPRAZolam (XANAX) 0.5 MG tablet Take 0.5 mg by mouth 3 (three) times daily as needed for anxiety.     . Cholecalciferol (VITAMIN D3) 2000 UNITS TABS Take 2 tablets by mouth daily.     . cyanocobalamin 100 MCG tablet Take 100 mcg by mouth daily.    . fexofenadine (ALLEGRA) 180 MG tablet Take 180 mg by mouth as needed for allergies. Reported on 07/18/2015    . fluticasone (FLONASE) 50 MCG/ACT nasal spray  Place 1 spray into both nostrils daily. 16 g 2  . hydrochlorothiazide (MICROZIDE) 12.5 MG capsule Take 1 capsule (12.5 mg total) by mouth daily. 90 capsule 2  . losartan (COZAAR) 25 MG tablet TAKE 2 TABLETS BY MOUTH EVERY DAY 60 tablet 11  . Multiple Vitamins-Minerals (ZINC PO) Take 1 tablet by mouth daily.    [name] omeprazole (PRILOSEC) 20 MG capsule TAKE 1 CAPSULE DAILY 90 capsule 1  . SYNTHROID 100 MCG tablet TAKE ONE TABLET DAILY AND TAKE ONE-HALF (1/2) TABLET ON SUNDAY, 90 tablet 3   No current facility-administered medications on file prior to visit.    Review of Systems:  As per HPI- otherwise negative.   Physical Examination: Vitals:   09/16/19 1102  BP: 128/78  Pulse: 77  Resp: 18  Temp: (!) 97.4 F (36.3 C)  SpO2: 95%   Vitals:   09/16/19 1102  Weight: 203 lb (92.1 kg)  Height: 5\' 5"  (1.651 m)   Body mass index is 33.78 kg/m. Ideal Body Weight: Weight in (lb) to have BMI = 25: 149.9  GEN: no acute distress.  Obese, looks well  HEENT: Atraumatic, Normocephalic.  Ears and Nose: No external deformity. CV: RRR, No M/G/R. No JVD. No thrill. No extra heart sounds. PULM: CTA B, no wheezes, crackles, rhonchi. No retractions. No resp. distress. No accessory muscle use. EXTR: No c/c/e PSYCH: Normally interactive. Conversant.    Assessment and Plan: Mixed hyperlipidemia - Plan: Lipid panel  Essential hypertension - Plan: CBC, Comprehensive metabolic panel  Hypothyroidism, unspecified type - Plan:  TSH  Vitamin D deficiency - Plan: VITAMIN D 25 Hydroxy (Vit-D Deficiency, Fractures)  Screening for diabetes mellitus - Plan: Hemoglobin A1c  Depression, recurrent (HCC) - Plan: FLUoxetine (PROZAC) 20 MG capsule, ALPRAZolam (XANAX) 0.5 MG tablet  Gastroesophageal reflux disease - Plan: omeprazole (PRILOSEC) 20 MG capsule  Here today for follow-up visit.  Unfortunately her depression has worsened over the last year without medication.  We discussed options, she would like to go back on Prozac for the time being.  We will have her start on 20 mg and increase to 40 after 1 to 2 weeks.  She is also used alprazolam in the past, I gave her a refill of this to use as needed for insomnia.  Cautioned her not to use with alcohol, and that it can be habit-forming We discussed other methods of dealing with depression including increased socialization, alcohol decrease her abstinence, exercise Refilled her omeprazole as well Labs pending as above Asked her to check with me in 4 to 6 weeks, sooner if worse Moderate medical decision making today This visit occurred during the SARS-CoV-2 public health emergency.  Safety protocols were in place, including screening questions prior to the visit, additional usage of staff PPE, and extensive cleaning of exam room while observing appropriate contact time as indicated for disinfecting solutions.    Signed Lamar Blinks, MD  Received her labs as below, message to patient  Results for orders placed or performed in visit on 09/16/19  CBC  Result Value Ref Range   WBC 6.7 4.0 - 10.5 K/uL   RBC 4.56 3.87 - 5.11 Mil/uL   Platelets 314.0 150.0 - 400.0 K/uL   Hemoglobin 14.3 12.0 - 15.0 g/dL   HCT 41.9 36.0 - 46.0 %   MCV 91.9 78.0 - 100.0 fl   MCHC 34.1 30.0 - 36.0 g/dL   RDW 13.5 11.5 - 15.5 %  Comprehensive metabolic panel  Result Value Ref Range  Sodium 137 135 - 145 mEq/L   Potassium 4.5 3.5 - 5.1 mEq/L   Chloride 103 96 - 112 mEq/L   CO2 29 19 -  32 mEq/L   Glucose, Bld 92 70 - 99 mg/dL   BUN 9 6 - 23 mg/dL   Creatinine, Ser 8.54 0.40 - 1.20 mg/dL   Total Bilirubin 0.5 0.2 - 1.2 mg/dL   Alkaline Phosphatase 95 39 - 117 U/L   AST 22 0 - 37 U/L   ALT 15 0 - 35 U/L   Total Protein 6.4 6.0 - 8.3 g/dL   Albumin 4.1 3.5 - 5.2 g/dL   GFR 62.70 >35.00 mL/min   Calcium 9.5 8.4 - 10.5 mg/dL  Hemoglobin X3G  Result Value Ref Range   Hgb A1c MFr Bld 5.6 4.6 - 6.5 %  Lipid panel  Result Value Ref Range   Cholesterol 221 (H) 0 - 200 mg/dL   Triglycerides 182.9 0.0 - 149.0 mg/dL   HDL 93.71 >69.67 mg/dL   VLDL 89.3 0.0 - 81.0 mg/dL   LDL Cholesterol 175 (H) 0 - 99 mg/dL   Total CHOL/HDL Ratio 3    NonHDL 157.46   TSH  Result Value Ref Range   TSH 0.57 0.35 - 4.50 uIU/mL  VITAMIN D 25 Hydroxy (Vit-D Deficiency, Fractures)  Result Value Ref Range   VITD 72.94 30.00 - 100.00 ng/mL   The 10-year ASCVD risk score Denman George DC Jr., et al., 2013) is: 15.6%   Values used to calculate the score:     Age: 51 years     Sex: Female     Is Non-Hispanic African American: No     Diabetic: No     Tobacco smoker: No     Systolic Blood Pressure: 128 mmHg     Is BP treated: Yes     HDL Cholesterol: 63.7 mg/dL     Total Cholesterol: 221 mg/dL

## 2019-09-15 NOTE — Patient Instructions (Addendum)
It was great to see you again today, I will be in touch with your labs soon as possible I am sorry that you are having such a hard time right now I would encourage you to decrease or stop alcohol use- this may worsen your depression  [employer] may have an exercise group that would be appropriate for you- I agree that walking for exercise, esp with a group, would be very beneficial for you.  You might also want to try joining a gym, or a yoga or pilates studio  Let's start you back on prozac 20 mg, increase to 40 after 1-2 weeks May use alprazolam sparingly as needed for acute anxiety or insomnia-do not use with alcohol  Please see me in 4-6 weeks for a recheck- in person or virtual ok

## 2019-09-16 ENCOUNTER — Other Ambulatory Visit: Payer: Self-pay

## 2019-09-16 ENCOUNTER — Ambulatory Visit (INDEPENDENT_AMBULATORY_CARE_PROVIDER_SITE_OTHER): Payer: Medicare Other | Admitting: Family Medicine

## 2019-09-16 ENCOUNTER — Encounter: Payer: Self-pay | Admitting: Family Medicine

## 2019-09-16 VITALS — BP 128/78 | HR 77 | Temp 97.4°F | Resp 18 | Ht 65.0 in | Wt 203.0 lb

## 2019-09-16 DIAGNOSIS — Z131 Encounter for screening for diabetes mellitus: Secondary | ICD-10-CM | POA: Diagnosis not present

## 2019-09-16 DIAGNOSIS — K219 Gastro-esophageal reflux disease without esophagitis: Secondary | ICD-10-CM

## 2019-09-16 DIAGNOSIS — E559 Vitamin D deficiency, unspecified: Secondary | ICD-10-CM

## 2019-09-16 DIAGNOSIS — E782 Mixed hyperlipidemia: Secondary | ICD-10-CM | POA: Diagnosis not present

## 2019-09-16 DIAGNOSIS — E039 Hypothyroidism, unspecified: Secondary | ICD-10-CM

## 2019-09-16 DIAGNOSIS — F339 Major depressive disorder, recurrent, unspecified: Secondary | ICD-10-CM

## 2019-09-16 DIAGNOSIS — I10 Essential (primary) hypertension: Secondary | ICD-10-CM

## 2019-09-16 LAB — COMPREHENSIVE METABOLIC PANEL
ALT: 15 U/L (ref 0–35)
AST: 22 U/L (ref 0–37)
Albumin: 4.1 g/dL (ref 3.5–5.2)
Alkaline Phosphatase: 95 U/L (ref 39–117)
BUN: 9 mg/dL (ref 6–23)
CO2: 29 mEq/L (ref 19–32)
Calcium: 9.5 mg/dL (ref 8.4–10.5)
Chloride: 103 mEq/L (ref 96–112)
Creatinine, Ser: 0.76 mg/dL (ref 0.40–1.20)
GFR: 74.68 mL/min (ref >60.00)
Glucose, Bld: 92 mg/dL (ref 70–99)
Potassium: 4.5 mEq/L (ref 3.5–5.1)
Sodium: 137 mEq/L (ref 135–145)
Total Bilirubin: 0.5 mg/dL (ref 0.2–1.2)
Total Protein: 6.4 g/dL (ref 6.0–8.3)

## 2019-09-16 LAB — CBC
HCT: 41.9 % (ref 36.0–46.0)
Hemoglobin: 14.3 g/dL (ref 12.0–15.0)
MCHC: 34.1 g/dL (ref 30.0–36.0)
MCV: 91.9 fl (ref 78.0–100.0)
Platelets: 314 10*3/uL (ref 150.0–400.0)
RBC: 4.56 Mil/uL (ref 3.87–5.11)
RDW: 13.5 % (ref 11.5–15.5)
WBC: 6.7 10*3/uL (ref 4.0–10.5)

## 2019-09-16 LAB — TSH: TSH: 0.57 u[IU]/mL (ref 0.35–4.50)

## 2019-09-16 LAB — LIPID PANEL
Cholesterol: 221 mg/dL — ABNORMAL HIGH (ref 0–200)
HDL: 63.7 mg/dL (ref >39.00)
LDL Cholesterol: 129 mg/dL — ABNORMAL HIGH (ref 0–99)
NonHDL: 157.46
Total CHOL/HDL Ratio: 3
Triglycerides: 143 mg/dL (ref 0.0–149.0)
VLDL: 28.6 mg/dL (ref 0.0–40.0)

## 2019-09-16 LAB — VITAMIN D 25 HYDROXY (VIT D DEFICIENCY, FRACTURES): VITD: 72.94 ng/mL (ref 30.00–100.00)

## 2019-09-16 LAB — HEMOGLOBIN A1C: Hgb A1c MFr Bld: 5.6 % (ref 4.6–6.5)

## 2019-09-16 MED ORDER — FLUOXETINE HCL 20 MG PO CAPS: 20.0000 mg | ORAL_CAPSULE | Freq: Every day | ORAL | 5 refills | Status: DC

## 2019-09-16 MED ORDER — ALPRAZOLAM 0.5 MG PO TABS: 0.2500 mg | ORAL_TABLET | Freq: Two times a day (BID) | ORAL | 0 refills | Status: DC | PRN

## 2019-09-16 MED ORDER — OMEPRAZOLE 20 MG PO CPDR: 20.0000 mg | DELAYED_RELEASE_CAPSULE | Freq: Every day | ORAL | 3 refills | Status: DC

## 2019-10-08 ENCOUNTER — Other Ambulatory Visit: Payer: Self-pay | Admitting: Family Medicine

## 2019-10-08 DIAGNOSIS — F339 Major depressive disorder, recurrent, unspecified: Secondary | ICD-10-CM

## 2019-10-12 ENCOUNTER — Ambulatory Visit (INDEPENDENT_AMBULATORY_CARE_PROVIDER_SITE_OTHER): Payer: Medicare Other | Admitting: Psychology

## 2019-10-12 DIAGNOSIS — F330 Major depressive disorder, recurrent, mild: Secondary | ICD-10-CM | POA: Diagnosis not present

## 2019-10-13 ENCOUNTER — Ambulatory Visit (INDEPENDENT_AMBULATORY_CARE_PROVIDER_SITE_OTHER): Payer: Medicare Other | Admitting: Family Medicine

## 2019-10-13 ENCOUNTER — Encounter: Payer: Self-pay | Admitting: Family Medicine

## 2019-10-13 ENCOUNTER — Other Ambulatory Visit: Payer: Self-pay

## 2019-10-13 VITALS — BP 124/80 | HR 54 | Temp 97.4°F | Resp 18 | Ht 65.0 in | Wt 205.2 lb

## 2019-10-13 DIAGNOSIS — R682 Dry mouth, unspecified: Secondary | ICD-10-CM

## 2019-10-13 DIAGNOSIS — R432 Parageusia: Secondary | ICD-10-CM

## 2019-10-13 DIAGNOSIS — K219 Gastro-esophageal reflux disease without esophagitis: Secondary | ICD-10-CM | POA: Diagnosis not present

## 2019-10-13 MED ORDER — PANTOPRAZOLE SODIUM 40 MG PO TBEC: 40.0000 mg | DELAYED_RELEASE_TABLET | Freq: Every day | ORAL | 2 refills | Status: DC

## 2019-10-13 NOTE — Progress Notes (Signed)
Patient ID: Gail Fuentes, female    DOB: Jan 05, 1946  Age: 74 y.o. MRN: [id number]    Subjective:  Subjective  HPI Gail Fuentes presents for dry mouth and lack of taste     Her gerd has been worse as well as are her allergies but her antihistamine dries her eyes and mough more   Review of Systems  Constitutional: Negative for appetite change, diaphoresis, fatigue and unexpected weight change.  Eyes: Negative for pain, redness and visual disturbance.  Respiratory: Negative for cough, chest tightness, shortness of breath and wheezing.   Cardiovascular: Negative for chest pain, palpitations and leg swelling.  Endocrine: Negative for cold intolerance, heat intolerance, polydipsia, polyphagia and polyuria.  Genitourinary: Negative for difficulty urinating, dysuria and frequency.  Neurological: Negative for dizziness, light-headedness, numbness and headaches.    History Past Medical History:  Diagnosis Date  . Abnormal EKG    per patient- states evaluated by Dr Gail Fuentes in the past  . Allergy    sneezing  late Fall  . Anemia 01/23/12   H/H 11.7/35  . Anxiety state, unspecified   . Arthritis   . Benign paroxysmal positional vertigo   . Depression   . Depressive disorder, not elsewhere classified   . Diverticulosis   . Esophageal reflux   . Esophageal stricture   . Gallstones   . Heart murmur   . Hiatal hernia   . History of hyperparathyroidism   . History of UTI   . HTN (hypertension)   . Hyperlipidemia   . OSA (obstructive sleep apnea) 05/27/2018  . Other abnormal glucose   . Personal history of other diseases of digestive system    12-15 since 1990, no recurrence 2006  . PONV (postoperative nausea and vomiting)   . Unspecified hypothyroidism   . Vitamin D deficiency     She has a past surgical history that includes Dental surgery (1962, 2012); Cesarean section (1977); Colonoscopy (12/2003); Total abdominal hysterectomy w/ bilateral salpingoophorectomy (2005);  Cholecystectomy (2004); and Parathyroidectomy (Left, 02/13/2013).   Her family history includes Arthritis in her brother; COPD in her father and mother; Cancer in her brother; Colon polyps in her mother; Coronary artery disease in her father; Depression in her mother; Diabetes in her mother; Dysrhythmia in her mother; Heart attack (age of onset: 74) in her father; Heart disease in her brother; Hypertension in her brother; Kidney disease in her mother; Other in her brother; Prostate cancer in her brother and brother; Stroke in her maternal aunt and maternal grandmother; Stroke (age of onset: 13) in her maternal grandfather.She reports that she quit smoking about 46 years ago. She has never used smokeless tobacco. She reports current alcohol use of about 14.0 standard drinks of alcohol per week. She reports that she does not use drugs.  Current Outpatient Medications on File Prior to Visit  Medication Sig Dispense Refill  . ALPRAZolam (XANAX) 0.5 MG tablet Take 0.5-1 tablets (0.25-0.5 mg total) by mouth 2 (two) times daily as needed for anxiety. 30 tablet 0  . Cholecalciferol (VITAMIN D3) 2000 UNITS TABS Take 2 tablets by mouth daily.     . cyanocobalamin 100 MCG tablet Take 100 mcg by mouth daily.    [name] FLUoxetine (PROZAC) 20 MG capsule TAKE 1 CAPSULE (20 MG TOTAL) BY MOUTH DAILY. INCREASE TO 2 CAPSULES AFTER 1-2 WEEKS 180 capsule 2  . fluticasone (FLONASE) 50 MCG/ACT nasal spray Place 1 spray into both nostrils daily. 16 g 2  . hydrochlorothiazide (MICROZIDE) 12.5 MG capsule  Take 1 capsule (12.5 mg total) by mouth daily. 90 capsule 2  . losartan (COZAAR) 25 MG tablet TAKE 2 TABLETS BY MOUTH EVERY DAY 60 tablet 11  . Multiple Vitamins-Minerals (ZINC PO) Take 1 tablet by mouth daily.    [name] SYNTHROID 100 MCG tablet TAKE ONE TABLET DAILY AND TAKE ONE-HALF (1/2) TABLET ON SUNDAY, 90 tablet 3   No current facility-administered medications on file prior to visit.     Objective:  Objective  Physical  Exam Vitals and nursing note reviewed.  Constitutional:      Appearance: She is well-developed.  HENT:     Head: Normocephalic and atraumatic.     Mouth/Throat:     Mouth: Mucous membranes are dry.     Comments: Tongue dry  No thrush  Eyes:     Conjunctiva/sclera: Conjunctivae normal.  Neck:     Thyroid: No thyromegaly.     Vascular: No carotid bruit or JVD.  Cardiovascular:     Rate and Rhythm: Normal rate and regular rhythm.     Heart sounds: Normal heart sounds. No murmur.  Pulmonary:     Effort: Pulmonary effort is normal. No respiratory distress.     Breath sounds: Normal breath sounds. No wheezing or rales.  Chest:     Chest wall: No tenderness.  Musculoskeletal:     Cervical back: Normal range of motion and neck supple.  Neurological:     Mental Status: She is alert and oriented to person, place, and time.    BP 124/80 (BP Location: Right Arm, Patient Position: Sitting, Cuff Size: Large)   Pulse (!) 54   Temp (!) 97.4 F (36.3 C) (Temporal)   Resp 18   Ht 5\' 5"  (1.651 m)   Wt 205 lb 3.2 oz (93.1 kg)   SpO2 97%   BMI 34.15 kg/m  Wt Readings from Last 3 Encounters:  10/13/19 205 lb 3.2 oz (93.1 kg)  09/16/19 203 lb (92.1 kg)  05/14/19 207 lb 6.4 oz (94.1 kg)     Lab Results  Component Value Date   WBC 6.7 09/16/2019   HGB 14.3 09/16/2019   HCT 41.9 09/16/2019   PLT 314.0 09/16/2019   GLUCOSE 92 09/16/2019   CHOL 221 (H) 09/16/2019   TRIG 143.0 09/16/2019   HDL 63.70 09/16/2019   LDLDIRECT 142.7 12/27/2008   LDLCALC 129 (H) 09/16/2019   ALT 15 09/16/2019   AST 22 09/16/2019   NA 137 09/16/2019   K 4.5 09/16/2019   CL 103 09/16/2019   CREATININE 0.76 09/16/2019   BUN 9 09/16/2019   CO2 29 09/16/2019   TSH 0.57 09/16/2019   HGBA1C 5.6 09/16/2019    DG Wrist Complete Right  Result Date: 12/25/2018 CLINICAL DATA:  Wrist pain.  Fall. EXAM: RIGHT WRIST - COMPLETE 3+ VIEW COMPARISON:  11/06/18 FINDINGS: Nondisplaced, intra-articular fracture of the  radial styloid is identified and appears unchanged from previous exam. No new fracture or dislocation. IMPRESSION: Unchanged appearance of nondisplaced, intra-articular fracture involving the radial styloid. No new findings. Electronically Signed   By: 01/07/19 M.D.   On: 12/25/2018 15:58     Assessment & Plan:  Plan  I have discontinued Jacqualine Weichel. Hassing's fexofenadine and omeprazole. I am also having her start on pantoprazole. Additionally, I am having her maintain her Vitamin D3, cyanocobalamin, hydrochlorothiazide, fluticasone, Synthroid, Multiple Vitamins-Minerals (ZINC PO), losartan, ALPRAZolam, and FLUoxetine.  Meds ordered this encounter  Medications  . pantoprazole (PROTONIX) 40 MG tablet  Sig: Take 1 tablet (40 mg total) by mouth daily.    Dispense:  30 tablet    Refill:  2    Problem List Items Addressed This Visit      Unprioritized   Abnormal sense of taste    May be from gerd or dry mouth  Allergies may also contribute but antihistamine drys mouth more so will hold off on that for now      Dry mouth    Try biotine       GERD - Primary    Change omeprazole to protonix May need GI for EGD if no improvement       Relevant Medications   pantoprazole (PROTONIX) 40 MG tablet      Follow-up: Return in about 3 months (around 01/13/2020), or if symptoms worsen or fail to improve, for f/u pcp .  Ann Held, DO

## 2019-10-13 NOTE — Patient Instructions (Addendum)
Try biotine for dry mouth---  Mouthwash, gum or toothpaste       Food Choices for Gastroesophageal Reflux Disease, Adult When you have gastroesophageal reflux disease (GERD), the foods you eat and your eating habits are very important. Choosing the right foods can help ease your discomfort. Think about working with a nutrition specialist (dietitian) to help you make good choices. What are tips for following this plan?  Meals  Choose healthy foods that are low in fat, such as fruits, vegetables, whole grains, low-fat dairy products, and lean meat, fish, and poultry.  Eat small meals often instead of 3 large meals a day. Eat your meals slowly, and in a place where you are relaxed. Avoid bending over or lying down until 2-3 hours after eating.  Avoid eating meals 2-3 hours before bed.  Avoid drinking a lot of liquid with meals.  Cook foods using methods other than frying. Bake, grill, or broil food instead.  Avoid or limit: ? Chocolate. ? Peppermint or spearmint. ? Alcohol. ? Pepper. ? Black and decaffeinated coffee. ? Black and decaffeinated tea. ? Bubbly (carbonated) soft drinks. ? Caffeinated energy drinks and soft drinks.  Limit high-fat foods such as: ? Fatty meat or fried foods. ? Whole milk, cream, butter, or ice cream. ? Nuts and nut butters. ? Pastries, donuts, and sweets made with butter or shortening.  Avoid foods that cause symptoms. These foods may be different for everyone. Common foods that cause symptoms include: ? Tomatoes. ? Oranges, lemons, and limes. ? Peppers. ? Spicy food. ? Onions and garlic. ? Vinegar. Lifestyle  Maintain a healthy weight. Ask your doctor what weight is healthy for you. If you need to lose weight, work with your doctor to do so safely.  Exercise for at least 30 minutes for 5 or more days each week, or as told by your doctor.  Wear loose-fitting clothes.  Do not smoke. If you need help quitting, ask your doctor.  Sleep with  the head of your bed higher than your feet. Use a wedge under the mattress or blocks under the bed frame to raise the head of the bed. Summary  When you have gastroesophageal reflux disease (GERD), food and lifestyle choices are very important in easing your symptoms.  Eat small meals often instead of 3 large meals a day. Eat your meals slowly, and in a place where you are relaxed.  Limit high-fat foods such as fatty meat or fried foods.  Avoid bending over or lying down until 2-3 hours after eating.  Avoid peppermint and spearmint, caffeine, alcohol, and chocolate. This information is not intended to replace advice given to you by your health care provider. Make sure you discuss any questions you have with your health care provider. Document Revised: 08/14/2018 Document Reviewed: 05/29/2016 Elsevier Patient Education  2020 [employer].

## 2019-10-14 ENCOUNTER — Telehealth: Payer: Self-pay

## 2019-10-14 DIAGNOSIS — R432 Parageusia: Secondary | ICD-10-CM | POA: Insufficient documentation

## 2019-10-14 DIAGNOSIS — R682 Dry mouth, unspecified: Secondary | ICD-10-CM | POA: Insufficient documentation

## 2019-10-14 NOTE — Assessment & Plan Note (Signed)
May be from gerd or dry mouth  Allergies may also contribute but antihistamine drys mouth more so will hold off on that for now

## 2019-10-14 NOTE — Assessment & Plan Note (Signed)
Try biotine

## 2019-10-14 NOTE — Telephone Encounter (Signed)
[facility] called in stating that they need a Prior Authorization  For the following medication pantoprazole (PROTONIX) 40 MG tablet [[id number]]    Please send it to  [id number] [place], [place] - [address]  [address] [place] [address]  Phone:  [phone] Fax:  [phone]  DEA #:  [id number]

## 2019-10-14 NOTE — Assessment & Plan Note (Signed)
Change omeprazole to protonix May need GI for EGD if no improvement

## 2019-10-15 NOTE — Telephone Encounter (Signed)
I haven't seen it yet. I will be on the look at for it.

## 2019-10-15 NOTE — Telephone Encounter (Signed)
(  Key: [id number])

## 2019-10-15 NOTE — Telephone Encounter (Signed)
Performed PA, please let me know Gail Fuentes if you get the approval/denial letter in case I don't. I will fax to pharmacy for patient.

## 2019-10-31 NOTE — Patient Instructions (Addendum)
It was good to see you again today! I am so glad that you have seen improvement with the Prozac.  Try increasing to 40 mg for a few weeks and see if this offers any additional improvement  I will be in touch with your labs-we will try to find any explanation for the burning mouth and taste changes you have noticed Let me know also the Protonix seems to make any difference

## 2019-10-31 NOTE — Progress Notes (Addendum)
[place] [facility] [address] [place], [place] [address] [phone]  Date:  11/02/2019   Name:  Gail Fuentes   DOB:  [birth year]   MRN:  [id number]  PCP:  Darreld Mclean, MD    Chief Complaint: Depression and Follow-up (follow up from dr. Etter Sjogren, metallic taste in mouth)   History of Present Illness:  Gail Fuentes is a 74 y.o. very pleasant female patient who presents with the following:  Patient here today for a follow-up visit.  History of cardiomegaly, hypertension, sleep apnea, hypothyroidism, hyperlipidemia, vitamin D deficiency and osteoporosis  Last seen by myself in May of this year -At that time she was having more difficulty with depression, increased crying and irritability We put her back on Prozac which she had used previously.  I also refilled her alprazolam to use as needed for insomnia.  I encouraged her to cut down alcohol, because she was drinking a bit too much She has been under some stress recently, and admitted to drinking 2+ glasses of wine daily At that time her husband had recently retired and was home with her all the time, her young teen grandson was also into emotional problems including OCD and anxiety, depression He is treated with zoloft and is doing pretty ok Her son remarried and brought 2 new grands into the family She was seen again on June 8 by my partner Dr. Etter Sjogren with dry mouth/lack of taste, as well as worsening of GERD symptoms Dr. Etter Sjogren changed her PPI to Protonix and suggested Biotene rinse for dry mouth  Covid vaccine is complete Complete labs done in May  Her 24 yo grandson is on prozac, his grades have slipped and he is struggling.  She worries about him a lot  She is taking 20 mg of prozac- she does feel like it is helping her some.  She is thinking of increasing to 40 mg She has noted some possible OCD tendencies such as counting or repeating thoughts  She has  noted this change in her taste sense for 2 or 3 months She just got her protonix approved and will try this soon   She has noted a burning/ sensitive tongue recently  She visited her DDS recently and all was well   She has cut down significanly on her alcohol intake.  She is sleeping better   Lab Results  Component Value Date   TSH 0.57 09/16/2019     Patient Active Problem List   Diagnosis Date Noted  . Dry mouth 10/14/2019  . Abnormal sense of taste 10/14/2019  . Closed nondisplaced fracture of styloid process of right radius 10/30/2018  . OSA (obstructive sleep apnea) 05/27/2018  . History of hyperparathyroidism 08/24/2014  . Obesity (BMI 30-39.9) 10/08/2013  . HTN (hypertension) 07/13/2013  . Elevated blood pressure reading without diagnosis of hypertension 06/29/2013  . Osteoporosis 11/14/2012  . Acute iritis of both eyes 10/17/2012  . Thyroid nodule 10/17/2012  . Cardiomegaly 02/20/2012  . DISTURBANCES OF SENSATION OF SMELL AND TASTE 04/19/2010  . Vitamin D deficiency 12/24/2008  . ANXIETY 12/24/2008  . DEPRESSION 12/24/2008  . BENIGN PAROXYSMAL POSITIONAL VERTIGO 12/24/2008  . HYPERGLYCEMIA 12/24/2008  . Nonspecific abnormal electrocardiogram (ECG) (EKG) 12/24/2008  . Hyperlipidemia 07/23/2007  . Hypothyroidism 07/22/2007  . GERD 07/22/2007  . DIVERTICULITIS, HX OF 07/22/2007    Past Medical History:  Diagnosis Date  . Abnormal EKG    per patient-  states evaluated by Dr Daleen Squibb in the past  . Allergy    sneezing  late Fall  . Anemia 01/23/12   H/H 11.7/35  . Anxiety state, unspecified   . Arthritis   . Benign paroxysmal positional vertigo   . Depression   . Depressive disorder, not elsewhere classified   . Diverticulosis   . Esophageal reflux   . Esophageal stricture   . Gallstones   . Heart murmur   . Hiatal hernia   . History of hyperparathyroidism   . History of UTI   . HTN (hypertension)   . Hyperlipidemia   . OSA (obstructive sleep apnea)  05/27/2018  . Other abnormal glucose   . Personal history of other diseases of digestive system    12-15 since 1990, no recurrence 2006  . PONV (postoperative nausea and vomiting)   . Unspecified hypothyroidism   . Vitamin D deficiency     Past Surgical History:  Procedure Laterality Date  . CESAREAN SECTION  1977  . CHOLECYSTECTOMY  2004  . COLONOSCOPY  12/2003   Tics  . DENTAL SURGERY  1962, 2012   Age 59-Abscessed Teeth Extracted   . PARATHYROIDECTOMY Left 02/13/2013   Procedure: LEFT INFERIOR PARATHYROIDECTOMY with frozen section ;  Surgeon: Velora Heckler, MD;  Location: [facility];  Service: General;  Laterality: Left;  . TOTAL ABDOMINAL HYSTERECTOMY W/ BILATERAL SALPINGOOPHORECTOMY  2005   Ovarian Cyst    Social History   Tobacco Use  . Smoking status: Former Smoker    Quit date: 05/07/1973    Years since quitting: 46.5  . Smokeless tobacco: Never Used  . Tobacco comment: Smoked 1968-1975 up to 2 ppd  Vaping Use  . Vaping Use: Never used  Substance Use Topics  . Alcohol use: Yes    Alcohol/week: 14.0 standard drinks    Types: 14 Standard drinks or equivalent per week    Comment: 2 nightly wines  . Drug use: No    Family History  Problem Relation Age of Onset  . Coronary artery disease Father   . Heart attack Father 17  . COPD Father   . Diabetes Mother   . Dysrhythmia Mother   . Depression Mother   . COPD Mother   . Colon polyps Mother   . Kidney disease Mother   . Prostate cancer Brother   . Other Brother        Wegener's in sinus; [facility]  . Heart disease Brother   . Arthritis Brother   . Hypertension Brother   . Prostate cancer Brother   . Cancer Brother        Soft tissue in the face/temple  . Stroke Maternal Aunt        >65  . Stroke Maternal Grandmother        >65  . Stroke Maternal Grandfather 47    Allergies  Allergen Reactions  . Augmentin [Amoxicillin-Pot Clavulanate] Rash    Medication list has been reviewed and updated.  Current  Outpatient Medications on File Prior to Visit  Medication Sig Dispense Refill  . ALPRAZolam (XANAX) 0.5 MG tablet Take 0.5-1 tablets (0.25-0.5 mg total) by mouth 2 (two) times daily as needed for anxiety. 30 tablet 0  . Cholecalciferol (VITAMIN D3) 2000 UNITS TABS Take 2 tablets by mouth daily.     . cyanocobalamin 100 MCG tablet Take 100 mcg by mouth daily.    [name] FLUoxetine (PROZAC) 20 MG capsule TAKE 1 CAPSULE (20 MG TOTAL) BY MOUTH DAILY. INCREASE  TO 2 CAPSULES AFTER 1-2 WEEKS 180 capsule 2  . fluticasone (FLONASE) 50 MCG/ACT nasal spray Place 1 spray into both nostrils daily. 16 g 2  . hydrochlorothiazide (MICROZIDE) 12.5 MG capsule Take 1 capsule (12.5 mg total) by mouth daily. 90 capsule 2  . losartan (COZAAR) 25 MG tablet TAKE 2 TABLETS BY MOUTH EVERY DAY 60 tablet 11  . Multiple Vitamins-Minerals (ZINC PO) Take 1 tablet by mouth daily.    . pantoprazole (PROTONIX) 40 MG tablet Take 1 tablet (40 mg total) by mouth daily. 30 tablet 2  . SYNTHROID 100 MCG tablet TAKE ONE TABLET DAILY AND TAKE ONE-HALF (1/2) TABLET ON SUNDAY, 90 tablet 3   No current facility-administered medications on file prior to visit.    Review of Systems:  As per HPI- otherwise negative.   Physical Examination: Vitals:   11/02/19 1020  BP: 128/80  Pulse: (!) 58  Resp: 17  SpO2: 98%   Vitals:   11/02/19 1020  Weight: 205 lb (93 kg)  Height: 5\' 5"  (1.651 m)   Body mass index is 34.11 kg/m. Ideal Body Weight: Weight in (lb) to have BMI = 25: 149.9  GEN: no acute distress.  Obese, otherwise looks well HEENT: Atraumatic, Normocephalic.   Bilateral TM wnl, oropharynx normal.  PEERL,EOMI.   Patient has had extensive dental work.  Otherwise, no change or abnormality of her tongue or oral cavity is noted Ears and Nose: No external deformity. CV: RRR, No M/G/R. No JVD. No thrill. No extra heart sounds. PULM: CTA B, no wheezes, crackles, rhonchi. No retractions. No resp. distress. No accessory muscle  use. ABD: S, NT, ND, +BS. No rebound. No HSM. EXTR: No c/c/e PSYCH: Normally interactive. Conversant.    Assessment and Plan: Gastroesophageal reflux disease, unspecified whether esophagitis present  Dry mouth  Depression, recurrent (HCC)  Burning mouth syndrome - Plan: Ferritin, B12, Zinc  Patient here today with a couple of concerns.  She has been stressed and feeling depressed, notes anxiety especially concerning the health of her grandson.  I encouraged her to try increasing her Prozac to 40 mg and she is willing to give this a try She has noted a burning mouth and taste change recently.  It is possible that she had an asymptomatic COVID-19 infection.  We will also check a ferritin, B12 and zinc level to see if we can find any explanation She will keep posted about her symptoms This visit occurred during the SARS-CoV-2 public health emergency.  Safety protocols were in place, including screening questions prior to the visit, additional usage of staff PPE, and extensive cleaning of exam room while observing appropriate contact time as indicated for disinfecting solutions.    Signed , MD   Received her labs as below, message to patient  Results for orders placed or performed in visit on 11/02/19  Ferritin  Result Value Ref Range   Ferritin 60.5 10.0 - 291.0 ng/mL  B12  Result Value Ref Range   Vitamin B-12 962 (H) 211 - 911 pg/mL  Zinc  Result Value Ref Range   Zinc 68 60 - 130 mcg/dL   Addendum 11/04/19, received her zinc level which is also normal Message to patient

## 2019-11-02 ENCOUNTER — Encounter: Payer: Self-pay | Admitting: Family Medicine

## 2019-11-02 ENCOUNTER — Ambulatory Visit (INDEPENDENT_AMBULATORY_CARE_PROVIDER_SITE_OTHER): Payer: Medicare Other | Admitting: Family Medicine

## 2019-11-02 ENCOUNTER — Other Ambulatory Visit: Payer: Self-pay

## 2019-11-02 VITALS — BP 128/80 | HR 58 | Resp 17 | Ht 65.0 in | Wt 205.0 lb

## 2019-11-02 DIAGNOSIS — K219 Gastro-esophageal reflux disease without esophagitis: Secondary | ICD-10-CM

## 2019-11-02 DIAGNOSIS — K146 Glossodynia: Secondary | ICD-10-CM

## 2019-11-02 DIAGNOSIS — F339 Major depressive disorder, recurrent, unspecified: Secondary | ICD-10-CM

## 2019-11-02 DIAGNOSIS — R682 Dry mouth, unspecified: Secondary | ICD-10-CM

## 2019-11-02 LAB — FERRITIN: Ferritin: 60.5 ng/mL (ref 10.0–291.0)

## 2019-11-02 LAB — VITAMIN B12: Vitamin B-12: 962 pg/mL — ABNORMAL HIGH (ref 211–911)

## 2019-11-04 ENCOUNTER — Encounter: Payer: Self-pay | Admitting: Family Medicine

## 2019-11-04 LAB — ZINC: Zinc: 68 ug/dL (ref 60–130)

## 2019-11-19 ENCOUNTER — Ambulatory Visit (INDEPENDENT_AMBULATORY_CARE_PROVIDER_SITE_OTHER): Payer: Medicare Other | Admitting: Psychology

## 2019-11-19 DIAGNOSIS — F330 Major depressive disorder, recurrent, mild: Secondary | ICD-10-CM

## 2019-11-20 ENCOUNTER — Other Ambulatory Visit: Payer: Self-pay

## 2019-11-20 ENCOUNTER — Ambulatory Visit (INDEPENDENT_AMBULATORY_CARE_PROVIDER_SITE_OTHER): Payer: Medicare Other | Admitting: Internal Medicine

## 2019-11-20 ENCOUNTER — Encounter: Payer: Self-pay | Admitting: Internal Medicine

## 2019-11-20 VITALS — BP 138/76 | HR 65 | Ht 65.0 in | Wt 203.0 lb

## 2019-11-20 DIAGNOSIS — E039 Hypothyroidism, unspecified: Secondary | ICD-10-CM

## 2019-11-20 DIAGNOSIS — E041 Nontoxic single thyroid nodule: Secondary | ICD-10-CM

## 2019-11-20 DIAGNOSIS — E559 Vitamin D deficiency, unspecified: Secondary | ICD-10-CM | POA: Diagnosis not present

## 2019-11-20 DIAGNOSIS — Z8639 Personal history of other endocrine, nutritional and metabolic disease: Secondary | ICD-10-CM | POA: Diagnosis not present

## 2019-11-20 DIAGNOSIS — M8080XD Other osteoporosis with current pathological fracture, unspecified site, subsequent encounter for fracture with routine healing: Secondary | ICD-10-CM

## 2019-11-20 MED ORDER — SYNTHROID 100 MCG PO TABS: ORAL_TABLET | ORAL | 3 refills | Status: DC

## 2019-11-20 NOTE — Patient Instructions (Signed)
Please think about Fosamax (Alendronate) or Reclast (Zolendronate) and let me know if you want to start.  Please come back for a follow-up appointment in 1 year.  Alendronate tablets What is this medicine? ALENDRONATE (a LEN droe nate) slows calcium loss from bones. It helps to make normal healthy bone and to slow bone loss in people with Paget's disease and osteoporosis. It may be used in others at risk for bone loss. This medicine may be used for other purposes; ask your health care provider or pharmacist if you have questions. COMMON BRAND NAME(S): Fosamax What should I tell my health care provider before I take this medicine? They need to know if you have any of these conditions:  dental disease  esophagus, stomach, or intestine problems, like acid reflux or GERD  kidney disease  low blood calcium  low vitamin D  problems sitting or standing 30 minutes  trouble swallowing  an unusual or allergic reaction to alendronate, other medicines, foods, dyes, or preservatives  pregnant or trying to get pregnant  breast-feeding How should I use this medicine? You must take this medicine exactly as directed or you will lower the amount of the medicine you absorb into your body or you may cause yourself harm. Take this medicine by mouth first thing in the morning, after you are up for the day. Do not eat or drink anything before you take your medicine. Swallow the tablet with a full glass (6 to 8 fluid ounces) of plain water. Do not take this medicine with any other drink. Do not chew or crush the tablet. After taking this medicine, do not eat breakfast, drink, or take any medicines or vitamins for at least 30 minutes. Sit or stand up for at least 30 minutes after you take this medicine; do not lie down. Do not take your medicine more often than directed. Talk to your pediatrician regarding the use of this medicine in children. Special care may be needed. Overdosage: If you think you have  taken too much of this medicine contact a poison control center or emergency room at once. NOTE: This medicine is only for you. Do not share this medicine with others. What if I miss a dose? If you miss a dose, do not take it later in the day. Continue your normal schedule starting the next morning. Do not take double or extra doses. What may interact with this medicine?  aluminum hydroxide  antacids  aspirin  calcium supplements  drugs for inflammation like ibuprofen, naproxen, and others  iron supplements  magnesium supplements  vitamins with minerals This list may not describe all possible interactions. Give your health care provider a list of all the medicines, herbs, non-prescription drugs, or dietary supplements you use. Also tell them if you smoke, drink alcohol, or use illegal drugs. Some items may interact with your medicine. What should I watch for while using this medicine? Visit your doctor or health care professional for regular checks ups. It may be some time before you see benefit from this medicine. Do not stop taking your medicine except on your doctor's advice. Your doctor or health care professional may order blood tests and other tests to see how you are doing. You should make sure you get enough calcium and vitamin D while you are taking this medicine, unless your doctor tells you not to. Discuss the foods you eat and the vitamins you take with your health care professional. Some people who take this medicine have severe bone, joint,  and/or muscle pain. This medicine may also increase your risk for a broken thigh bone. Tell your doctor right away if you have pain in your upper leg or groin. Tell your doctor if you have any pain that does not go away or that gets worse. This medicine can make you more sensitive to the sun. If you get a rash while taking this medicine, sunlight may cause the rash to get worse. Keep out of the sun. If you cannot avoid being in the sun,  wear protective clothing and use sunscreen. Do not use sun lamps or tanning beds/booths. What side effects may I notice from receiving this medicine? Side effects that you should report to your doctor or health care professional as soon as possible:  allergic reactions like skin rash, itching or hives, swelling of the face, lips, or tongue  black or tarry stools  bone, muscle or joint pain  changes in vision  chest pain  heartburn or stomach pain  jaw pain, especially after dental work  pain or trouble when swallowing  redness, blistering, peeling or loosening of the skin, including inside the mouth Side effects that usually do not require medical attention (report to your doctor or health care professional if they continue or are bothersome):  changes in taste  diarrhea or constipation  eye pain or itching  headache  nausea or vomiting  stomach gas or fullness This list may not describe all possible side effects. Call your doctor for medical advice about side effects. You may report side effects to FDA at 1-800-FDA-1088. Where should I keep my medicine? Keep out of the reach of children. Store at room temperature of 15 and 30 degrees C (59 and 86 degrees F). Throw away any unused medicine after the expiration date. NOTE: This sheet is a summary. It may not cover all possible information. If you have questions about this medicine, talk to your doctor, pharmacist, or health care provider.  2020 Elsevier/Gold Standard (2010-10-20 08:56:09)  Zoledronic Acid injection (Paget's Disease, Osteoporosis) What is this medicine? ZOLEDRONIC ACID (ZOE le dron ik AS id) lowers the amount of calcium loss from bone. It is used to treat Paget's disease and osteoporosis in women. This medicine may be used for other purposes; ask your health care provider or pharmacist if you have questions. COMMON BRAND NAME(S): Reclast, Zometa What should I tell my health care provider before I take  this medicine? They need to know if you have any of these conditions:  aspirin-sensitive asthma  cancer, especially if you are receiving medicines used to treat cancer  dental disease or wear dentures  infection  kidney disease  low levels of calcium in the blood  past surgery on the parathyroid gland or intestines  receiving corticosteroids like dexamethasone or prednisone  an unusual or allergic reaction to zoledronic acid, other medicines, foods, dyes, or preservatives  pregnant or trying to get pregnant  breast-feeding How should I use this medicine? This medicine is for infusion into a vein. It is given by a health care professional in a hospital or clinic setting. Talk to your pediatrician regarding the use of this medicine in children. This medicine is not approved for use in children. Overdosage: If you think you have taken too much of this medicine contact a poison control center or emergency room at once. NOTE: This medicine is only for you. Do not share this medicine with others. What if I miss a dose? It is important not to miss your dose.  Call your doctor or health care professional if you are unable to keep an appointment. What may interact with this medicine?  certain antibiotics given by injection  NSAIDs, medicines for pain and inflammation, like ibuprofen or naproxen  some diuretics like bumetanide, furosemide  teriparatide This list may not describe all possible interactions. Give your health care provider a list of all the medicines, herbs, non-prescription drugs, or dietary supplements you use. Also tell them if you smoke, drink alcohol, or use illegal drugs. Some items may interact with your medicine. What should I watch for while using this medicine? Visit your doctor or health care professional for regular checkups. It may be some time before you see the benefit from this medicine. Do not stop taking your medicine unless your doctor tells you to. Your  doctor may order blood tests or other tests to see how you are doing. Women should inform their doctor if they wish to become pregnant or think they might be pregnant. There is a potential for serious side effects to an unborn child. Talk to your health care professional or pharmacist for more information. You should make sure that you get enough calcium and vitamin D while you are taking this medicine. Discuss the foods you eat and the vitamins you take with your health care professional. Some people who take this medicine have severe bone, joint, and/or muscle pain. This medicine may also increase your risk for jaw problems or a broken thigh bone. Tell your doctor right away if you have severe pain in your jaw, bones, joints, or muscles. Tell your doctor if you have any pain that does not go away or that gets worse. Tell your dentist and dental surgeon that you are taking this medicine. You should not have major dental surgery while on this medicine. See your dentist to have a dental exam and fix any dental problems before starting this medicine. Take good care of your teeth while on this medicine. Make sure you see your dentist for regular follow-up appointments. What side effects may I notice from receiving this medicine? Side effects that you should report to your doctor or health care professional as soon as possible:  allergic reactions like skin rash, itching or hives, swelling of the face, lips, or tongue  anxiety, confusion, or depression  breathing problems  changes in vision  eye pain  feeling faint or lightheaded, falls  jaw pain, especially after dental work  mouth sores  muscle cramps, stiffness, or weakness  redness, blistering, peeling or loosening of the skin, including inside the mouth  trouble passing urine or change in the amount of urine Side effects that usually do not require medical attention (report to your doctor or health care professional if they continue or  are bothersome):  bone, joint, or muscle pain  constipation  diarrhea  fever  hair loss  irritation at site where injected  loss of appetite  nausea, vomiting  stomach upset  trouble sleeping  trouble swallowing  weak or tired This list may not describe all possible side effects. Call your doctor for medical advice about side effects. You may report side effects to FDA at 1-800-FDA-1088. Where should I keep my medicine? This drug is given in a hospital or clinic and will not be stored at home. NOTE: This sheet is a summary. It may not cover all possible information. If you have questions about this medicine, talk to your doctor, pharmacist, or health care provider.  2020 Elsevier/Gold Standard (2013-09-19 14:19:57)

## 2019-11-20 NOTE — Progress Notes (Signed)
Subjective:     Patient ID: Gail Fuentes, female   DOB: December 05, 1945, 74 y.o.   MRN: [id number]  This visit occurred during the SARS-CoV-2 public health emergency.  Safety protocols were in place, including screening questions prior to the visit, additional usage of staff PPE, and extensive cleaning of exam room while observing appropriate contact time as indicated for disinfecting solutions.   HPI Gail Fuentes is a pleasant 74 y/o returning for f/u for dx hyperparathyroidism, s/p parathyroidectomy, hypothyroidism, and a R thyroid nodule. Last visit 1 year ago.  Reviewed history: Pt had a thyroid ultrasound (10/08/2012) showing only a right thyroid nodule, 7 x 7 x 7 mm, with internal calcifications. On a subsequent thyroid U/S >> this nodule has disappeared.  A technetium sestamibi scan showed a left inferior parathyroid adenoma and a CT scan confirmed this. However, during surgery, it was found that she had a left superior parathyroid adenoma, which was excised - left superior parathyroidectomy with Dr Gerrit Friends in 02/2013.   She feels much better after surgery.  Her PTH and calcium levels normalized after surgery: Lab Results  Component Value Date   PTH 35 11/15/2015   PTH Comment 11/15/2015   PTH 41 11/15/2014   PTH Comment 11/15/2014   PTH 44 03/16/2014   PTH Comment 03/16/2014   CALCIUM 9.5 09/16/2019   CALCIUM 9.4 11/20/2018   CALCIUM 9.7 03/06/2018   CALCIUM 9.3 11/15/2017   CALCIUM 9.8 02/13/2017   CALCIUM 9.3 11/15/2016   CALCIUM 9.7 11/15/2015   CALCIUM 9.5 07/18/2015   CALCIUM 9.5 11/15/2014   CALCIUM 9.4 09/24/2014   CALCIUM 8.8 03/16/2014   CALCIUM 9.3 09/03/2013   CALCIUM 9.2 04/20/2013   CALCIUM 9.1 03/16/2013   CALCIUM 9.1 02/27/2013   No history of kidney stones but had an osteoporotic T score at the ultra distal radius per DXA scan in 2014.  + h/o R Styloid radial fracture 10/2018.  DXA scan [facility]) 12/03/2018 - osteoporosis: - L1-L4: -2.5  (-3.7%*) - RFN: -2.0 - LFN: -1.9 (-1.3%* for both FN) -33% distal radius: -1.9 (-4.4%)  DXA scan ([facility]) 11/28/2016 - osteopenia: - L1-L4: -2.2 - RFN: -1.1 - LFN: -1.3 FRAX: Major osteoporotic fracture risk over 10 years: 13.7; hip fracture risk over 10 years: 2.1. But unfortunately appendicular skeleton was not checked.  I suggested Prolia after the last bone density returned but she was afraid of side effects and did not start.  She has FH of M - many fractures.  H/o vit D def  Previously on ergocalciferol, now on vitamin D 4000 units daily.  Reviewed vitamin D levels: Lab Results  Component Value Date   VD25OH 72.94 09/16/2019   VD25OH 62.76 11/20/2018   VD25OH 25.82 (L) 11/15/2017   VD25OH 43.09 11/15/2016   VD25OH 45.13 11/15/2015   VD25OH 34.16 07/18/2015   VD25OH 30.52 10/12/2014   VD25OH 19.76 (L) 03/16/2014   VD25OH 35 09/03/2013   VD25OH 35 11/04/2012   Hypothyroidism.  -Longstanding  Pt is on Synthroid d.a.w. 100 mcg 6/7 days and 50 mcg 1/7 days, taken: - in am - fasting - at least 30 min from b'fast - no Ca, Fe, MVI, + PPIs later in the day - not on Biotin  Reviewed TFTs: Lab Results  Component Value Date   TSH 0.57 09/16/2019   TSH 1.19 11/20/2018   TSH 0.77 05/19/2018   TSH 0.99 11/15/2017   TSH 1.13 11/15/2016   FREET4 1.04 11/20/2018  FREET4 0.97 11/15/2017   FREET4 0.88 11/15/2016   FREET4 0.92 11/15/2014   FREET4 0.77 09/21/2014   Pt denies: - feeling nodules in neck - hoarseness - dysphagia - choking - SOB with lying down  Review of Systems Constitutional: no weight gain/no weight loss, no fatigue, no subjective hyperthermia, no subjective hypothermia Eyes: no blurry vision, no xerophthalmia ENT: no sore throat, + see HPI Cardiovascular: no CP/no SOB/no palpitations/no leg swelling Respiratory: no cough/no SOB/no wheezing Gastrointestinal: no N/no V/no D/no C/no acid reflux Musculoskeletal: no muscle aches/no  joint aches Skin: no rashes, no hair loss Neurological: no tremors/no numbness/no tingling/no dizziness  I reviewed pt's medications, allergies, PMH, social hx, family hx, and changes were documented in the history of present illness. Otherwise, unchanged from my initial visit note.  Past Medical History:  Diagnosis Date  . Abnormal EKG    per patient- states evaluated by Dr Daleen Squibb in the past  . Allergy    sneezing  late Fall  . Anemia 01/23/12   H/H 11.7/35  . Anxiety state, unspecified   . Arthritis   . Benign paroxysmal positional vertigo   . Depression   . Depressive disorder, not elsewhere classified   . Diverticulosis   . Esophageal reflux   . Esophageal stricture   . Gallstones   . Heart murmur   . Hiatal hernia   . History of hyperparathyroidism   . History of UTI   . HTN (hypertension)   . Hyperlipidemia   . OSA (obstructive sleep apnea) 05/27/2018  . Other abnormal glucose   . Personal history of other diseases of digestive system    12-15 since 1990, no recurrence 2006  . PONV (postoperative nausea and vomiting)   . Unspecified hypothyroidism   . Vitamin D deficiency    Past Surgical History:  Procedure Laterality Date  . CESAREAN SECTION  1977  . CHOLECYSTECTOMY  2004  . COLONOSCOPY  12/2003   Tics  . DENTAL SURGERY  1962, 2012   Age 8-Abscessed Teeth Extracted   . PARATHYROIDECTOMY Left 02/13/2013   Procedure: LEFT INFERIOR PARATHYROIDECTOMY with frozen section ;  Surgeon: Velora Heckler, MD;  Location: [facility];  Service: General;  Laterality: Left;  . TOTAL ABDOMINAL HYSTERECTOMY W/ BILATERAL SALPINGOOPHORECTOMY  2005   Ovarian Cyst   Social History   Socioeconomic History  . Marital status: Married    Spouse name: Not on file  . Number of children: 1  . Years of education: Not on file  . Highest education level: Not on file  Occupational History  . 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 Occupation: retired Visual merchandiser   Tobacco Use  . Smoking status: Former Smoker    Quit date: 05/07/1973    Years since quitting: 46.5  . Smokeless tobacco: Never Used  . Tobacco comment: Smoked 1968-1975 up to 2 ppd  Vaping Use  . Vaping Use: Never used  Substance and Sexual Activity  . Alcohol use: Yes    Alcohol/week: 14.0 standard drinks    Types: 14 Standard drinks or equivalent per week    Comment: 2 nightly wines  . Drug use: No  . Sexual activity: Not Currently  Other Topics Concern  . Not on file  Social History Narrative   Regular exercise: no   Caffeine use: daily   Social Determinants of Health   Financial Resource Strain:   . Difficulty of Paying Living Expenses:   Food Insecurity:   .  Worried About Programme researcher, broadcasting/film/video in the Last Year:   . Barista in the Last Year:   Transportation Needs:   . Freight forwarder (Medical):   [name] Lack of Transportation (Non-Medical):   Physical Activity:   . Days of Exercise per Week:   . Minutes of Exercise per Session:   Stress:   . Feeling of Stress :   Social Connections:   . Frequency of Communication with Friends and Family:   . Frequency of Social Gatherings with Friends and Family:   . Attends Religious Services:   . Active Member of Clubs or Organizations:   . Attends Banker Meetings:   [name] Marital Status:   Intimate Partner Violence:   . Fear of Current or Ex-Partner:   . Emotionally Abused:   [name] Physically Abused:   . Sexually Abused:    Current Outpatient Medications on File Prior to Visit  Medication Sig Dispense Refill  . ALPRAZolam (XANAX) 0.5 MG tablet Take 0.5-1 tablets (0.25-0.5 mg total) by mouth 2 (two) times daily as needed for anxiety. 30 tablet 0  . Cholecalciferol (VITAMIN D3) 2000 UNITS TABS Take 2 tablets by mouth daily.     . cyanocobalamin 100 MCG tablet Take 100 mcg by mouth daily.    [name] FLUoxetine (PROZAC) 20 MG capsule TAKE 1 CAPSULE (20 MG TOTAL) BY MOUTH DAILY. INCREASE TO 2 CAPSULES AFTER 1-2 WEEKS  180 capsule 2  . fluticasone (FLONASE) 50 MCG/ACT nasal spray Place 1 spray into both nostrils daily. 16 g 2  . hydrochlorothiazide (MICROZIDE) 12.5 MG capsule Take 1 capsule (12.5 mg total) by mouth daily. 90 capsule 2  . losartan (COZAAR) 25 MG tablet TAKE 2 TABLETS BY MOUTH EVERY DAY 60 tablet 11  . Multiple Vitamins-Minerals (ZINC PO) Take 1 tablet by mouth daily.    . pantoprazole (PROTONIX) 40 MG tablet Take 1 tablet (40 mg total) by mouth daily. 30 tablet 2  . SYNTHROID 100 MCG tablet TAKE ONE TABLET DAILY AND TAKE ONE-HALF (1/2) TABLET ON SUNDAY, 90 tablet 3   No current facility-administered medications on file prior to visit.   Allergies  Allergen Reactions  . Augmentin [Amoxicillin-Pot Clavulanate] Rash   Family History  Problem Relation Age of Onset  . Coronary artery disease Father   . Heart attack Father 61  . COPD Father   . Diabetes Mother   . Dysrhythmia Mother   . Depression Mother   . COPD Mother   . Colon polyps Mother   . Kidney disease Mother   . Prostate cancer Brother   . Other Brother        Wegener's in sinus; [facility]  . Heart disease Brother   . Arthritis Brother   . Hypertension Brother   . Prostate cancer Brother   . Cancer Brother        Soft tissue in the face/temple  . Stroke Maternal Aunt        >65  . Stroke Maternal Grandmother        >65  . Stroke Maternal Grandfather 47    Objective:   Physical Exam There were no vitals taken for this visit. There is no height or weight on file to calculate BMI. Wt Readings from Last 3 Encounters:  11/02/19 205 lb (93 kg)  10/13/19 205 lb 3.2 oz (93.1 kg)  09/16/19 203 lb (92.1 kg)   Constitutional: overweight, in NAD Eyes: PERRLA, EOMI, no exophthalmos ENT: moist mucous membranes, no  thyromegaly, no cervical lymphadenopathy Cardiovascular: RRR, No MRG Respiratory: CTA B Gastrointestinal: abdomen soft, NT, ND, BS+ Musculoskeletal: no deformities, strength intact in all 4 Skin: moist, warm, no  rashes Neurological: no tremor with outstretched hands, DTR normal in all 4  Assessment:     1. Primary hyperparathyroidism - status post left superior parathyroidectomy 02/13/2013, by Dr. Gerrit Friends - calcium < 1 mg/dl above normal before the surgery - elevated PTH (133, 238) - normal vit D  - elevated 1,25 vitamin D - no renal failure - osteopenia  - no h/o kidney stones - DXA 01/23/2010 ([facility]):  L1-4 T score -2.0  Left femoral neck T score -1.9  Right femoral neck T score -1.4  10 year any fracture risk 13.8%, 10 year hip fracture risk 2.9% - CT neck 01/14/2013:    Focal nodular appearance along the posterior aspect of the left lobe of thyroid corresponds with a sestamibi exam and suggests a left lower parathyroid adenoma.   No significant nodular tissue adjacent to the submandibular glands.   Sub centimeter lymph nodes are likely within normal limits.  Focal calcification is noted within the right lobe of the thyroid.  - Tc sestamibi scan 12/29/2012: suspicious for left thyroid lobe region parathyroid  adenoma. Questionable abnormal focus also in the right submandibular space - DXA 11/14/2012 ([facility]):  L1-4 T score -2.0 >> -2.1 (but high SD, L1 T score is -2.8)  Left femoral neck T score -1.9 >> -1.9  Right femoral neck T score -1.4 >> -1.7  33% distal radius: -1.5  But worrisome that UD radius -3.1 (good evaluation for trabecular bone) - UCa 300 mg/24h (11/06/2012)   She had parathyroidectomy with Dr Gerrit Friends on 02/13/2013.   - DXA 11/28/2016 ([facility]):  - L1-L4: -2.2  - RFN: -1.1  - LFN: -1.3 FRAX: Major osteoporotic fracture risk over 10 years: 13.7; hip fracture risk over 10 years: 2.1.  2. Thyroid nodule - thyroid ultrasound 10/08/2012: Thyroid echotexture is heterogeneous.   Right lobe: Measures 3 x 1.2 x 1.1 cm.   Left lobe: Measures 2.9 x 1.9 x 0.9 cm.   Isthmus: Measures 0.4 cm.   Focal lesions: Within the lower pole of the right  lobe of thyroid gland there is a hypoechoic nodule containing coarsened calcifications. This measures 7 x 7 x 7 mm.   No lymphadenopathy identified. - thyroid U/S 03/22/2014:  Right thyroid lobe: 3.4 x 1.1 x 1.0 cm. Stable to slightly smaller focal area of nodularity with dystrophic shadowing calcifications measures approximately 0.6 cm in diameter. The thyroid parenchyma is very heterogeneous and shows no abnormal vascularity.  Left thyroid lobe: 2.9 x 1.1 x 1.1 cm. No nodules visualized. Heterogeneous parenchyma noted without abnormal vascularity. The left lobe is small in size.  Isthmus Thickness: 0.9 cm. No nodules visualized.  Lymphadenopathy None visualized.  3. Hypothyroidism   4. vitamin D deficiency  5. OP  Plan:     1. Primary Hyperparathyroidism: -Patient with history of primary hyperparathyroidism, with minimally elevated calcium but high PTH levels in the past.  She is now status post parathyroidectomy in 02/2013 by Dr. Gerrit Friends.  At that time, she had resection of a 0.5 g adenoma.  Subsequent calcium and PTH levels were normal. -Her most recent calcium level was normal in 09/2019: Lab Results  Component Value Date   CALCIUM 9.5 09/16/2019   CALCIUM 9.4 11/20/2018   2. R thyroid nodule -She had a 7 mm thyroid nodule with internal calcifications which  were stable and even smaller on the latest thyroid ultrasound -No neck compression symptoms -No follow-up ultrasound needed unless she becomes symptomatic.  For now, we will follow her conservatively.  3. Hypothyroidism - latest thyroid labs reviewed with pt >> normal: Lab Results  Component Value Date   TSH 0.57 09/16/2019   - she continues on Synthroid d.a.w. 100 mcg 6/7 days and 50 mcg 1/7 days mcg daily - pt feels good on this dose. - we discussed about taking the thyroid hormone every day, with water, >30 minutes before breakfast, separated by >4 hours from acid reflux medications, calcium, iron, multivitamins.  Pt. is taking it correctly.  4. Vit D deficiency -Her vitamin D level was normal at last visit and she had another level in 09/2019, and 72.9, normal -Continue 4000 units vitamin D daily  5.  Osteoporosis  -We reviewed previous DXA scan reports:  She had osteoporosis at the radius before her parathyroid surgery.  Bone density scan from 2018 showed an improved set of T-scores, however, they were checked on another bone density machine. She had a repeat DXA scan on 12/03/2018 and this showed worsening of her spine and hips T-scores. -After the above results returned, we discussed about possible treatments for her osteoporosis and I suggested Prolia.  We discussed at length about possible side effects but also the very good safety profile and magnitude of fracture risk reduction. Her co-pay for the Prolia injection was $255. She did not start Prolia yet as she was afraid of side effects.  We reviewed the side effects again and I explained that these are very rare.   -However, for [place] healthcare, insurance now covers bisphosphonates as a first-line.  We discussed about alendronate versus zoledronic acid.  She would like to think about these-given written information about both. -She is due for another DXA scan next year-we will order this at next visit   Carlus Pavlov, MD PhD [facility] Endocr[facility]

## 2019-12-06 HISTORY — PX: CATARACT EXTRACTION, BILATERAL: SHX1313

## 2019-12-09 ENCOUNTER — Encounter: Payer: Self-pay | Admitting: Family Medicine

## 2019-12-09 ENCOUNTER — Other Ambulatory Visit: Payer: Self-pay

## 2019-12-09 ENCOUNTER — Ambulatory Visit (HOSPITAL_BASED_OUTPATIENT_CLINIC_OR_DEPARTMENT_OTHER)
Admission: RE | Admit: 2019-12-09 | Discharge: 2019-12-09 | Disposition: A | Discharge status: Home / Self Care | Payer: Medicare Other | Source: Ambulatory Visit | Attending: Family Medicine | Admitting: Family Medicine

## 2019-12-09 ENCOUNTER — Telehealth (INDEPENDENT_AMBULATORY_CARE_PROVIDER_SITE_OTHER): Payer: Medicare Other | Admitting: Family Medicine

## 2019-12-09 DIAGNOSIS — R1011 Right upper quadrant pain: Secondary | ICD-10-CM

## 2019-12-09 DIAGNOSIS — R0781 Pleurodynia: Secondary | ICD-10-CM | POA: Diagnosis not present
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
## 2019-12-10 ENCOUNTER — Ambulatory Visit (INDEPENDENT_AMBULATORY_CARE_PROVIDER_SITE_OTHER): Payer: Medicare Other

## 2019-12-10 ENCOUNTER — Ambulatory Visit (INDEPENDENT_AMBULATORY_CARE_PROVIDER_SITE_OTHER): Payer: Medicare Other | Admitting: Family Medicine

## 2019-12-10 VITALS — BP 110/70 | HR 68 | Temp 97.7°F | Resp 17 | Ht 65.0 in | Wt 202.0 lb

## 2019-12-10 DIAGNOSIS — R1011 Right upper quadrant pain: Secondary | ICD-10-CM | POA: Diagnosis not present

## 2019-12-10 DIAGNOSIS — R0781 Pleurodynia: Secondary | ICD-10-CM | POA: Diagnosis not present
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
## 2019-12-10 NOTE — Patient Instructions (Addendum)
It was good to see you again today- I am sorry you are having this pain tylenol is ok to take as needed Assuming your labs are ok I think we can observe for a few days in hope that this will clear up If you are getting worse over the weekend please go to the ER for a CT scan- you can also contact me if needed Otherwise, if you continue to have persistent pain next week we can do a CT

## 2019-12-11 LAB — CBC
HCT: 44 % (ref 36.0–46.0)
Hemoglobin: 14.6 g/dL (ref 12.0–15.0)
MCHC: 33.2 g/dL (ref 30.0–36.0)
MCV: 94.2 fl (ref 78.0–100.0)
Platelets: 308 10*3/uL (ref 150.0–400.0)
RBC: 4.67 Mil/uL (ref 3.87–5.11)
RDW: 13.4 % (ref 11.5–15.5)
WBC: 7.4 10*3/uL (ref 4.0–10.5)

## 2019-12-11 LAB — COMPREHENSIVE METABOLIC PANEL
ALT: 16 U/L (ref 0–35)
AST: 22 U/L (ref 0–37)
Albumin: 4.1 g/dL (ref 3.5–5.2)
Alkaline Phosphatase: 91 U/L (ref 39–117)
BUN: 7 mg/dL (ref 6–23)
CO2: 30 mEq/L (ref 19–32)
Calcium: 9.9 mg/dL (ref 8.4–10.5)
Chloride: 104 mEq/L (ref 96–112)
Creatinine, Ser: 0.83 mg/dL (ref 0.40–1.20)
GFR: 67.41 mL/min (ref >60.00)
Glucose, Bld: 123 mg/dL — ABNORMAL HIGH (ref 70–99)
Potassium: 4.7 mEq/L (ref 3.5–5.1)
Sodium: 139 mEq/L (ref 135–145)
Total Bilirubin: 0.6 mg/dL (ref 0.2–1.2)
Total Protein: 6.7 g/dL (ref 6.0–8.3)

## 2020-01-06 ENCOUNTER — Ambulatory Visit (INDEPENDENT_AMBULATORY_CARE_PROVIDER_SITE_OTHER): Payer: Medicare Other | Admitting: Psychology

## 2020-01-06 DIAGNOSIS — F330 Major depressive disorder, recurrent, mild: Secondary | ICD-10-CM

## 2020-01-24 ENCOUNTER — Other Ambulatory Visit: Payer: Self-pay | Admitting: Family Medicine

## 2020-01-24 DIAGNOSIS — K219 Gastro-esophageal reflux disease without esophagitis: Secondary | ICD-10-CM

## 2020-02-17 ENCOUNTER — Ambulatory Visit: Payer: Medicare Other | Admitting: Psychology

## 2020-02-18 ENCOUNTER — Other Ambulatory Visit: Payer: Self-pay | Admitting: Internal Medicine

## 2020-04-18 ENCOUNTER — Telehealth: Payer: Self-pay | Admitting: Family Medicine

## 2020-04-18 NOTE — Telephone Encounter (Signed)
Patient states a prior authorization is needed for pantoprazole (PROTONIX) 40 MG tablet [[id number]]   Prior approval phone number [phone]  Patient states she is in desperate need of medication.

## 2020-04-18 NOTE — Telephone Encounter (Signed)
Started PA on medication. Key: [id number] Waiting on determination.

## 2020-05-18 ENCOUNTER — Ambulatory Visit (INDEPENDENT_AMBULATORY_CARE_PROVIDER_SITE_OTHER): Payer: Medicare Other | Admitting: Psychology

## 2020-05-18 DIAGNOSIS — F330 Major depressive disorder, recurrent, mild: Secondary | ICD-10-CM

## 2020-06-22 ENCOUNTER — Other Ambulatory Visit: Payer: Self-pay | Admitting: Family Medicine

## 2020-06-27 ENCOUNTER — Ambulatory Visit (INDEPENDENT_AMBULATORY_CARE_PROVIDER_SITE_OTHER): Payer: Medicare Other | Admitting: Psychology

## 2020-06-27 DIAGNOSIS — F330 Major depressive disorder, recurrent, mild: Secondary | ICD-10-CM | POA: Diagnosis not present

## 2020-07-27 ENCOUNTER — Other Ambulatory Visit: Payer: Self-pay | Admitting: Family Medicine

## 2020-07-27 DIAGNOSIS — F339 Major depressive disorder, recurrent, unspecified: Secondary | ICD-10-CM

## 2020-08-02 NOTE — Progress Notes (Signed)
Chief Complaint  Patient presents with  . Follow-up    HTN   History of Present Illness: 75 yo female with history of HTN, HLD, GERD, anxiety, depression and hyperparathyroidism who is here today for cardiac follow up. She was followed remotely in our office by Dr. Verl Blalock for an abnormal EKG. Echo 2011 with normal LV function, no valve issues. I met her in January 2018. She reported occasional dyspnea. Her EKG showed poor R wave progression in the precordial leads and possible inferior Q waves, all unchanged from 2009. Echo February 2018 with normal LV systolic function, grade 1 diastolic dysfunction, mild LVH, no valve disease.   She is here today for follow up. The patient denies any chest pain, dyspnea, palpitations, lower extremity edema, orthopnea, PND, dizziness, near syncope or syncope.   Primary Care Physician: Darreld Mclean, MD  Past Medical History:  Diagnosis Date  . Abnormal EKG    per patient- states evaluated by Dr Verl Blalock in the past  . Allergy    sneezing  late Fall  . Anemia 01/23/12   H/H 11.7/35  . Anxiety state, unspecified   . Arthritis   . Benign paroxysmal positional vertigo   . Depression   . Depressive disorder, not elsewhere classified   . Diverticulosis   . Esophageal reflux   . Esophageal stricture   . Gallstones   . Heart murmur   . Hiatal hernia   . History of hyperparathyroidism   . History of UTI   . HTN (hypertension)   . Hyperlipidemia   . OSA (obstructive sleep apnea) 05/27/2018  . Other abnormal glucose   . Personal history of other diseases of digestive system    12-15 since 1990, no recurrence 2006  . PONV (postoperative nausea and vomiting)   . Unspecified hypothyroidism   . Vitamin D deficiency     Past Surgical History:  Procedure Laterality Date  . [place]  . CHOLECYSTECTOMY  2004  . COLONOSCOPY  12/2003   Tics  . DENTAL SURGERY  1962, 2012   Age 63-Abscessed Teeth Extracted   . PARATHYROIDECTOMY Left  02/13/2013   Procedure: LEFT INFERIOR PARATHYROIDECTOMY with frozen section ;  Surgeon: Earnstine Regal, MD;  Location: [facility];  Service: General;  Laterality: Left;  . TOTAL ABDOMINAL HYSTERECTOMY W/ BILATERAL SALPINGOOPHORECTOMY  2005   Ovarian Cyst    Current Outpatient Medications  Medication Sig Dispense Refill  . ALPRAZolam (XANAX) 0.5 MG tablet Take 0.5-1 tablets (0.25-0.5 mg total) by mouth 2 (two) times daily as needed for anxiety. 30 tablet 0  . Cholecalciferol (VITAMIN D3) 2000 UNITS TABS Take 2 tablets by mouth daily.     . cyanocobalamin 100 MCG tablet Take 100 mcg by mouth daily.    [name] FLUoxetine (PROZAC) 20 MG capsule TAKE 1 CAPSULE (20 MG TOTAL) BY MOUTH DAILY. INCREASE TO 2 CAPSULES AFTER 1-2 WEEKS 180 capsule 0  . fluticasone (FLONASE) 50 MCG/ACT nasal spray Place 1 spray into both nostrils daily. 16 g 2  . levothyroxine (SYNTHROID) 100 MCG tablet TAKE 1 TABLET BY MOUTH EVERY DAY AND 1/2 TABLET ON SUNDAY 90 tablet 1  . losartan (COZAAR) 25 MG tablet TAKE 2 TABLETS BY MOUTH EVERY DAY 180 tablet 0  . pantoprazole (PROTONIX) 40 MG tablet TAKE 1 TABLET BY MOUTH EVERY DAY 90 tablet 2   No current facility-administered medications for this visit.    Allergies  Allergen Reactions  . Augmentin [Amoxicillin-Pot Clavulanate] Rash  Social History   Socioeconomic History  . Marital status: Married    Spouse name: Not on file  . Number of children: 1  . Years of education: Not on file  . Highest education level: Not on file  Occupational History  . Occupation: Administrator, [employer]    Employer: RETIRED  . Occupation: retired Air cabin crew  Tobacco Use  . Smoking status: Former Smoker    Quit date: 05/07/1973    Years since quitting: 47.2  . Smokeless tobacco: Never Used  . Tobacco comment: Smoked 1968-1975 up to 2 ppd  Vaping Use  . Vaping Use: Never used  Substance and Sexual Activity  . Alcohol use: Yes    Alcohol/week: 14.0 standard drinks    Types: 14  Standard drinks or equivalent per week    Comment: 2 nightly wines  . Drug use: No  . Sexual activity: Not Currently  Other Topics Concern  . Not on file  Social History Narrative   Regular exercise: no   Caffeine use: daily   Social Determinants of Health   Financial Resource Strain: Not on file  Food Insecurity: Not on file  Transportation Needs: Not on file  Physical Activity: Not on file  Stress: Not on file  Social Connections: Not on file  Intimate Partner Violence: Not on file    Family History  Problem Relation Age of Onset  . Coronary artery disease Father   . Heart attack Father 37  . COPD Father   . Diabetes Mother   . Dysrhythmia Mother   . Depression Mother   . COPD Mother   . Colon polyps Mother   . Kidney disease Mother   . Prostate cancer Brother   . Other Brother        Wegener's in sinus; [place]  . Heart disease Brother   . Arthritis Brother   . Hypertension Brother   . Prostate cancer Brother   . Cancer Brother        Soft tissue in the face/temple  . Stroke Maternal Aunt        >65  . Stroke Maternal Grandmother        >65  . Stroke Maternal Grandfather 47    Review of Systems:  As stated in the HPI and otherwise negative.   BP 122/70   Pulse 61   Ht $R'5\' 5"'xW$  (1.651 m)   Wt 203 lb 3.2 oz (92.2 kg)   SpO2 97%   BMI 33.81 kg/m   Physical Examination:  General: Well developed, well nourished, NAD  HEENT: OP clear, mucus membranes moist  SKIN: warm, dry. No rashes. Neuro: No focal deficits  Musculoskeletal: Muscle strength 5/5 all ext  Psychiatric: Mood and affect normal  Neck: No JVD, no carotid bruits, no thyromegaly, no lymphadenopathy.  Lungs:Clear bilaterally, no wheezes, rhonci, crackles Cardiovascular: Regular rate and rhythm. No murmurs, gallops or rubs. Abdomen:Soft. Bowel sounds present. Non-tender.  Extremities: No lower extremity edema. Pulses are 2 + in the bilateral DP/PT.  Echo February 2018:  Left ventricle: The  cavity size was normal. Wall thickness was   increased in a pattern of mild LVH. Systolic function was normal.   The estimated ejection fraction was in the range of 60% to 65%.   Wall motion was normal; there were no regional wall motion   abnormalities. Doppler parameters are consistent with abnormal   left ventricular relaxation (grade 1 diastolic dysfunction).   Doppler parameters are consistent with high ventricular filling  pressure.  Impressions:  - Normal LV systolic function; grade 1 diastolic dysfunction with   elevated LV filling pressure; mild LVH.  EKG:  EKG is  ordered today. The ekg ordered today demonstrates Sinus  Recent Labs: 09/16/2019: TSH 0.57 12/10/2019: ALT 16; BUN 7; Creatinine, Ser 0.83; Hemoglobin 14.6; Platelets 308.0; Potassium 4.7; Sodium 139   Lipid Panel    Component Value Date/Time   CHOL 221 (H) 09/16/2019 1140   CHOL 236 (H) 10/12/2014 1046   TRIG 143.0 09/16/2019 1140   TRIG 109 10/12/2014 1046   TRIG 91 05/13/2006 1031   HDL 63.70 09/16/2019 1140   HDL 90 10/12/2014 1046   CHOLHDL 3 09/16/2019 1140   VLDL 28.6 09/16/2019 1140   LDLCALC 129 (H) 09/16/2019 1140   LDLCALC 124 (H) 10/12/2014 1046   LDLDIRECT 142.7 12/27/2008 1035     Wt Readings from Last 3 Encounters:  08/03/20 203 lb 3.2 oz (92.2 kg)  12/10/19 202 lb (91.6 kg)  11/20/19 203 lb (92.1 kg)     Assessment and Plan:   1. Hypertensive heart disease: Echo February 2018 with mild LVH, grade 1 diastolic dysfunction. BP is controlled. No chest pain or dyspnea. Continue current therapy  Current medicines are reviewed at length with the patient today.  The patient does not have concerns regarding medicines.  The following changes have been made:  no change  Labs/ tests ordered today include:   Orders Placed This Encounter  Procedures  . EKG 12-Lead    Disposition:   FU with me in 12  months  Signed, Lauree Chandler, Jerilynn Mages 08/03/2020 9:34 AM    [facility] [place], [place], [place]  [address] Phone: [phone]; Fax: [phone]

## 2020-08-03 ENCOUNTER — Encounter: Payer: Self-pay | Admitting: Cardiovascular Disease

## 2020-08-03 ENCOUNTER — Ambulatory Visit (INDEPENDENT_AMBULATORY_CARE_PROVIDER_SITE_OTHER): Payer: Medicare Other | Admitting: Cardiovascular Disease

## 2020-08-03 ENCOUNTER — Other Ambulatory Visit: Payer: Self-pay

## 2020-08-03 VITALS — BP 122/70 | HR 61 | Ht 65.0 in | Wt 203.2 lb

## 2020-08-03 DIAGNOSIS — I119 Hypertensive heart disease without heart failure: Secondary | ICD-10-CM | POA: Diagnosis not present

## 2020-08-03 NOTE — Patient Instructions (Signed)

## 2020-08-08 ENCOUNTER — Ambulatory Visit (INDEPENDENT_AMBULATORY_CARE_PROVIDER_SITE_OTHER): Payer: Medicare Other | Admitting: Psychology

## 2020-08-08 DIAGNOSIS — F330 Major depressive disorder, recurrent, mild: Secondary | ICD-10-CM

## 2020-08-10 ENCOUNTER — Other Ambulatory Visit: Payer: Self-pay | Admitting: Internal Medicine

## 2020-09-17 ENCOUNTER — Other Ambulatory Visit: Payer: Self-pay | Admitting: Family Medicine

## 2020-09-21 ENCOUNTER — Ambulatory Visit (INDEPENDENT_AMBULATORY_CARE_PROVIDER_SITE_OTHER): Payer: Medicare Other | Admitting: Psychology

## 2020-09-21 DIAGNOSIS — F330 Major depressive disorder, recurrent, mild: Secondary | ICD-10-CM

## 2020-10-14 ENCOUNTER — Other Ambulatory Visit: Payer: Self-pay | Admitting: Family Medicine

## 2020-10-17 ENCOUNTER — Other Ambulatory Visit: Payer: Self-pay | Admitting: Family Medicine

## 2020-10-17 DIAGNOSIS — F339 Major depressive disorder, recurrent, unspecified: Secondary | ICD-10-CM

## 2020-11-04 ENCOUNTER — Encounter: Payer: Self-pay | Admitting: Family Medicine

## 2020-11-04 ENCOUNTER — Ambulatory Visit (INDEPENDENT_AMBULATORY_CARE_PROVIDER_SITE_OTHER): Payer: Medicare Other | Admitting: Family Medicine

## 2020-11-04 ENCOUNTER — Other Ambulatory Visit: Payer: Self-pay

## 2020-11-04 VITALS — BP 110/64 | HR 68 | Temp 97.9°F | Ht 65.0 in | Wt 199.2 lb

## 2020-11-04 DIAGNOSIS — M79672 Pain in left foot: Secondary | ICD-10-CM

## 2020-11-04 NOTE — Progress Notes (Signed)
Musculoskeletal Exam  Patient: Gail Fuentes DOB: October 19, 1945  DOS: 11/04/2020  SUBJECTIVE:  Chief Complaint:   Chief Complaint  Patient presents with   Edema    Gail Fuentes is a 75 y.o.  female for evaluation and treatment of L foot pain.   Onset:  3 days ago. No inj or change in activity.  Location: balls of ft Character:  aching  Progression of issue:  has slightly improved Associated symptoms: swelling; had some redness and itching on one occasion No fevers, bruising No new topicals.  Treatment: to date has been rest and acetaminophen.   Neurovascular symptoms: no  Past Medical History:  Diagnosis Date   Abnormal EKG    per patient- states evaluated by Dr Daleen Squibb in the past   Allergy    sneezing  late Fall   Anemia 01/23/12   H/H 11.7/35   Anxiety state, unspecified    Arthritis    Benign paroxysmal positional vertigo    Depression    Depressive disorder, not elsewhere classified    Diverticulosis    Esophageal reflux    Esophageal stricture    Gallstones    Heart murmur    Hiatal hernia    History of hyperparathyroidism    History of UTI    HTN (hypertension)    Hyperlipidemia    OSA (obstructive sleep apnea) 05/27/2018   Other abnormal glucose    Personal history of other diseases of digestive system    12-15 since 1990, no recurrence 2006   PONV (postoperative nausea and vomiting)    Unspecified hypothyroidism    Vitamin D deficiency     Objective: VITAL SIGNS: BP 110/64   Pulse 68   Temp 97.9 F (36.6 C) (Oral)   Ht 5\' 5"  (1.651 m)   Wt 199 lb 4 oz (90.4 kg)   SpO2 94%   BMI 33.16 kg/m  Constitutional: Well formed, well developed. No acute distress. Thorax & Lungs: No accessory muscle use Musculoskeletal: L foot.   Tenderness to palpation: yes on the lateral dorsum on midfoot Deformity: no Ecchymosis: no Neurologic: Normal sensory function. Antalgic gait Psychiatric: Normal mood. Age appropriate judgment and insight. Alert &  oriented x 3.    Assessment:  Left foot pain  Plan: Elevate feet, ice, Tylenol. Unlikely to be a clot F/u prn. The patient voiced understanding and agreement to the plan.   [place], DO 11/04/20  1:15 PM

## 2020-11-04 NOTE — Patient Instructions (Addendum)
Ice/cold pack over area for 10-15 min twice daily.  Heat (pad or rice pillow in microwave) over affected area, 10-15 minutes twice daily.   OK to take Tylenol 1000 mg (2 extra strength tabs) or 975 mg (3 regular strength tabs) every 6 hours as needed.  Arch support is what you want. Consider Powerstep insoles. There are very quality over the counter inserts. Shop around online and in stores. Dr. Margart Sickles is a cheaper alternative, though is not as high of quality.   Send me a message in 2-3 weeks if we aren't turning the corner.   Let us know if you need anything.

## 2020-11-12 ENCOUNTER — Other Ambulatory Visit: Payer: Self-pay | Admitting: Family Medicine

## 2020-11-15 ENCOUNTER — Other Ambulatory Visit: Payer: Self-pay | Admitting: Internal Medicine

## 2020-11-22 ENCOUNTER — Ambulatory Visit (INDEPENDENT_AMBULATORY_CARE_PROVIDER_SITE_OTHER): Payer: Medicare Other | Admitting: Internal Medicine

## 2020-11-22 ENCOUNTER — Encounter: Payer: Self-pay | Admitting: Internal Medicine

## 2020-11-22 ENCOUNTER — Other Ambulatory Visit (INDEPENDENT_AMBULATORY_CARE_PROVIDER_SITE_OTHER): Payer: Medicare Other

## 2020-11-22 ENCOUNTER — Other Ambulatory Visit: Payer: Self-pay

## 2020-11-22 VITALS — BP 130/78 | HR 60 | Ht 65.0 in | Wt 201.4 lb

## 2020-11-22 DIAGNOSIS — E039 Hypothyroidism, unspecified: Secondary | ICD-10-CM | POA: Diagnosis not present

## 2020-11-22 DIAGNOSIS — E559 Vitamin D deficiency, unspecified: Secondary | ICD-10-CM | POA: Diagnosis not present

## 2020-11-22 DIAGNOSIS — Z8639 Personal history of other endocrine, nutritional and metabolic disease: Secondary | ICD-10-CM | POA: Diagnosis not present

## 2020-11-22 DIAGNOSIS — M8080XD Other osteoporosis with current pathological fracture, unspecified site, subsequent encounter for fracture with routine healing: Secondary | ICD-10-CM

## 2020-11-22 DIAGNOSIS — E041 Nontoxic single thyroid nodule: Secondary | ICD-10-CM

## 2020-11-22 LAB — BASIC METABOLIC PANEL
BUN: 11 mg/dL (ref 6–23)
CO2: 24 mEq/L (ref 19–32)
Calcium: 9 mg/dL (ref 8.4–10.5)
Chloride: 104 mEq/L (ref 96–112)
Creatinine, Ser: 0.79 mg/dL (ref 0.40–1.20)
GFR: 74.01 mL/min (ref >60.00)
Glucose, Bld: 66 mg/dL — ABNORMAL LOW (ref 70–99)
Potassium: 4.1 mEq/L (ref 3.5–5.1)
Sodium: 136 mEq/L (ref 135–145)

## 2020-11-22 LAB — T4, FREE: Free T4: 1.31 ng/dL (ref 0.60–1.60)

## 2020-11-22 LAB — VITAMIN D 25 HYDROXY (VIT D DEFICIENCY, FRACTURES): VITD: 65.97 ng/mL (ref 30.00–100.00)

## 2020-11-22 LAB — TSH: TSH: 0.15 u[IU]/mL — ABNORMAL LOW (ref 0.35–5.50)

## 2020-11-22 MED ORDER — LEVOTHYROXINE SODIUM 100 MCG PO TABS: ORAL_TABLET | ORAL | 3 refills | Status: DC

## 2020-11-22 NOTE — Patient Instructions (Addendum)
  Continue vitamin D 4000 units daily.  Continue Synthroid 100 mcg 6/7 days and 50 mcg 1/7 days.   Take the thyroid hormone every day, with water, at least 30 minutes before breakfast, separated by at least 4 hours from: - acid reflux medications - calcium - iron - multivitamins  Please stop at the lab.  Please come back for a follow-up appointment in 1 year.

## 2020-11-22 NOTE — Progress Notes (Signed)
Subjective:     Patient ID: Gail Fuentes, female   DOB: 07-14-1945, 75 y.o.   MRN: [id number]  This visit occurred during the SARS-CoV-2 public health emergency.  Safety protocols were in place, including screening questions prior to the visit, additional usage of staff PPE, and extensive cleaning of exam room while observing appropriate contact time as indicated for disinfecting solutions.   HPI [name] is a pleasant 75 y/o returning for f/u for dx hyperparathyroidism, s/p parathyroidectomy, hypothyroidism, and a R thyroid nodule. Last visit 1 year ago.  Interim history: No falls or fractures since last OV. No dizziness, vertigo, vision problems.  Reviewed history: Pt had a thyroid ultrasound (10/08/2012) showing only a right thyroid nodule, 7 x 7 x 7 mm, with internal calcifications. On a subsequent thyroid U/S >> this nodule has disappeared.  A technetium sestamibi scan showed a left inferior parathyroid adenoma and a CT scan confirmed this. However, during surgery, it was found that she had a left superior parathyroid adenoma, which was excised - left superior parathyroidectomy with Dr Gerrit Friends in 02/2013.   She feels much better after surgery.  Her PTH and calcium levels normalized after surgery: Lab Results  Component Value Date   PTH 35 11/15/2015   PTH Comment 11/15/2015   PTH 41 11/15/2014   PTH Comment 11/15/2014   PTH 44 03/16/2014   PTH Comment 03/16/2014   CALCIUM 9.9 12/10/2019   CALCIUM 9.5 09/16/2019   CALCIUM 9.4 11/20/2018   CALCIUM 9.7 03/06/2018   CALCIUM 9.3 11/15/2017   CALCIUM 9.8 02/13/2017   CALCIUM 9.3 11/15/2016   CALCIUM 9.7 11/15/2015   CALCIUM 9.5 07/18/2015   CALCIUM 9.5 11/15/2014   CALCIUM 9.4 09/24/2014   CALCIUM 8.8 03/16/2014   CALCIUM 9.3 09/03/2013   CALCIUM 9.2 04/20/2013   CALCIUM 9.1 03/16/2013   No history of kidney stones but had an osteoporotic T score at the ultra distal radius per DXA scan in 2014.   + h/o R Styloid  radial fracture 10/2018.  DXA scan [facility]) 12/03/2018 - osteoporosis: - L1-L4: -2.5 (-3.7%*) - RFN: -2.0 - LFN: -1.9 (-1.3%* for both FN) -33% distal radius: -1.9 (-4.4%)  DXA scan ([facility]) 11/28/2016 - osteopenia: - L1-L4: -2.2 - RFN: -1.1 - LFN: -1.3 FRAX: Major osteoporotic fracture risk over 10 years: 13.7; hip fracture risk over 10 years: 2.1. But unfortunately appendicular skeleton was not checked.  I suggested Prolia after the last bone density returned but she was afraid of side effects and did not start.  I then advised her to review information about Fosamax or Reclast, but she did not want to start any of them.  She has FH of M - many fractures.  H/o vit D def  Previously on ergocalciferol, now on vitamin D 4000 units daily.  Reviewed vitamin D levels: Lab Results  Component Value Date   VD25OH 72.94 09/16/2019   VD25OH 62.76 11/20/2018   VD25OH 25.82 (L) 11/15/2017   VD25OH 43.09 11/15/2016   VD25OH 45.13 11/15/2015   VD25OH 34.16 07/18/2015   VD25OH 30.52 10/12/2014   VD25OH 19.76 (L) 03/16/2014   VD25OH 35 09/03/2013   VD25OH 35 11/04/2012   Hypothyroidism.  -Longstanding  Pt is on Synthroid d.a.w. 100 mcg 6/7 days and 50 mcg 1/7 days, taken: - in am - fasting - at least 30-60 min from b'fast - no Ca, Fe, MVI, + PPIs later in the day - not on Biotin  Reviewed TFTs:  Lab Results  Component Value Date   TSH 0.57 09/16/2019   TSH 1.19 11/20/2018   TSH 0.77 05/19/2018   TSH 0.99 11/15/2017   TSH 1.13 11/15/2016   FREET4 1.04 11/20/2018   FREET4 0.97 11/15/2017   FREET4 0.88 11/15/2016   FREET4 0.92 11/15/2014   FREET4 0.77 09/21/2014   Pt denies: - feeling nodules in neck - hoarseness - dysphagia - choking - SOB with lying down  Review of Systems Constitutional: no weight gain/no weight loss, no fatigue, no subjective hyperthermia, no subjective hypothermia Eyes: no blurry vision, no xerophthalmia ENT: no  sore throat, + see HPI Cardiovascular: no CP/no SOB/no palpitations/no leg swelling Respiratory: no cough/no SOB/no wheezing Gastrointestinal: no N/no V/no D/no C/no acid reflux Musculoskeletal: no muscle aches/no joint aches Skin: no rashes, no hair loss Neurological: no tremors/no numbness/no tingling/no dizziness  I reviewed pt's medications, allergies, PMH, social hx, family hx, and changes were documented in the history of present illness. Otherwise, unchanged from my initial visit note.  Past Medical History:  Diagnosis Date   Abnormal EKG    per patient- states evaluated by Dr Daleen Squibb in the past   Allergy    sneezing  late Fall   Anemia 01/23/12   H/H 11.7/35   Anxiety state, unspecified    Arthritis    Benign paroxysmal positional vertigo    Depression    Depressive disorder, not elsewhere classified    Diverticulosis    Esophageal reflux    Esophageal stricture    Gallstones    Heart murmur    Hiatal hernia    History of hyperparathyroidism    History of UTI    HTN (hypertension)    Hyperlipidemia    OSA (obstructive sleep apnea) 05/27/2018   Other abnormal glucose    Personal history of other diseases of digestive system    12-15 since 1990, no recurrence 2006   PONV (postoperative nausea and vomiting)    Unspecified hypothyroidism    Vitamin D deficiency    Past Surgical History:  Procedure Laterality Date   CESAREAN SECTION  1977   CHOLECYSTECTOMY  2004   COLONOSCOPY  12/2003   Tics   DENTAL SURGERY  1962, 2012   Age 7-Abscessed Teeth Extracted    PARATHYROIDECTOMY Left 02/13/2013   Procedure: LEFT INFERIOR PARATHYROIDECTOMY with frozen section ;  Surgeon: Velora Heckler, MD;  Location: [facility];  Service: General;  Laterality: Left;   TOTAL ABDOMINAL HYSTERECTOMY W/ BILATERAL SALPINGOOPHORECTOMY  2005   Ovarian Cyst   Social History   Socioeconomic History   Marital status: Married    Spouse name: Not on file   Number of children: 1   Years of  education: Not on file   Highest education level: Not on file  Occupational History   Occupation: [employer] offices [employer] homes    Employer: RETIRED   Occupation: retired Visual merchandiser  Tobacco Use   Smoking status: Former    Types: Cigarettes    Quit date: 05/07/1973    Years since quitting: 47.5   Smokeless tobacco: Never   Tobacco comments:    Smoked 1968-1975 up to 2 ppd  Vaping Use   Vaping Use: Never used  Substance and Sexual Activity   Alcohol use: Yes    Alcohol/week: 14.0 standard drinks    Types: 14 Standard drinks or equivalent per week    Comment: 2 nightly wines   Drug use: No   Sexual activity: Not Currently  Other Topics Concern  Not on file  Social History Narrative   Regular exercise: no   Caffeine use: daily   Social Determinants of Health   Financial Resource Strain: Not on file  Food Insecurity: Not on file  Transportation Needs: Not on file  Physical Activity: Not on file  Stress: Not on file  Social Connections: Not on file  Intimate Partner Violence: Not on file   Current Outpatient Medications on File Prior to Visit  Medication Sig Dispense Refill   ALPRAZolam (XANAX) 0.5 MG tablet TAKE 0.5-1 TABLETS (0.25-0.5 MG TOTAL) BY MOUTH 2 (TWO) TIMES DAILY AS NEEDED FOR ANXIETY. 30 tablet 0   Cholecalciferol (VITAMIN D3) 2000 UNITS TABS Take 2 tablets by mouth daily.      cyanocobalamin 100 MCG tablet Take 100 mcg by mouth daily.     FLUoxetine (PROZAC) 20 MG capsule TAKE 1 CAPSULE (20 MG TOTAL) BY MOUTH DAILY. INCREASE TO 2 CAPSULES AFTER 1-2 WEEKS 180 capsule 0   fluticasone (FLONASE) 50 MCG/ACT nasal spray Place 1 spray into both nostrils daily. 16 g 2   levothyroxine (SYNTHROID) 100 MCG tablet TAKE 1 TABLET BY MOUTH EVERY DAY AND 1/2 TABLET ON SUNDAY 90 tablet 0   losartan (COZAAR) 25 MG tablet TAKE 2 TABLETS BY MOUTH EVERY DAY 60 tablet 0   pantoprazole (PROTONIX) 40 MG tablet TAKE 1 TABLET BY MOUTH EVERY DAY 90 tablet 2   No current  facility-administered medications on file prior to visit.   Allergies  Allergen Reactions   Augmentin [Amoxicillin-Pot Clavulanate] Rash   Family History  Problem Relation Age of Onset   Coronary artery disease Father    Heart attack Father 75   COPD Father    Diabetes Mother    Dysrhythmia Mother    Depression Mother    COPD Mother    Colon polyps Mother    Kidney disease Mother    Prostate cancer Brother    Other Brother        Wegener's in sinus; [facility]   Heart disease Brother    Arthritis Brother    Hypertension Brother    Prostate cancer Brother    Cancer Brother        Soft tissue in the face/temple   Stroke Maternal Aunt        >65   Stroke Maternal Grandmother        >65   Stroke Maternal Grandfather 47    Objective:   Physical Exam There were no vitals taken for this visit. There is no height or weight on file to calculate BMI. Wt Readings from Last 3 Encounters:  11/04/20 199 lb 4 oz (90.4 kg)  08/03/20 203 lb 3.2 oz (92.2 kg)  12/10/19 202 lb (91.6 kg)   Constitutional: overweight, in NAD Eyes: PERRLA, EOMI, no exophthalmos ENT: moist mucous membranes, no thyromegaly, no cervical lymphadenopathy Cardiovascular: RRR, No MRG Respiratory: CTA B Gastrointestinal: abdomen soft, NT, ND, BS+ Musculoskeletal: no deformities, strength intact in all 4 Skin: moist, warm, no rashes Neurological: no tremor with outstretched hands, DTR normal in all 4  Assessment:     1.  History of primary hyperparathyroidism - status post left superior parathyroidectomy 02/13/2013, by Dr. Gerrit Friends - calcium < 1 mg/dl above normal before the surgery - elevated PTH (133, 238) - normal vit D  - elevated 1,25 vitamin D - no renal failure - osteopenia  - no h/o kidney stones - DXA 01/23/2010 ([facility]): L1-4 T score -2.0 Left femoral neck T score -1.9 Right  femoral neck T score -1.4 10 year any fracture risk 13.8%, 10 year hip fracture risk 2.9% - CT neck 01/14/2013:   Focal  nodular appearance along the posterior aspect of the left lobe of thyroid corresponds with a sestamibi exam and suggests a left lower parathyroid adenoma.  No significant nodular tissue adjacent to the submandibular glands.  Sub centimeter lymph nodes are likely within normal limits. Focal calcification is noted within the right lobe of the thyroid.  - Tc sestamibi scan 12/29/2012: suspicious for left thyroid lobe region parathyroid  adenoma. Questionable abnormal focus also in the right submandibular space - DXA 11/14/2012 ([facility]): L1-4 T score -2.0 >> -2.1 (but high SD, L1 T score is -2.8) Left femoral neck T score -1.9 >> -1.9 Right femoral neck T score -1.4 >> -1.7 33% distal radius: -1.5 But worrisome that UD radius -3.1 (good evaluation for trabecular bone) - UCa 300 mg/24h (11/06/2012)   She had parathyroidectomy with Dr Gerrit Friends on 02/13/2013.   - DXA 11/28/2016 ([facility]): - L1-L4: -2.2 - RFN: -1.1 - LFN: -1.3 FRAX: Major osteoporotic fracture risk over 10 years: 13.7; hip fracture risk over 10 years: 2.1.  2. Thyroid nodule - thyroid ultrasound 10/08/2012: Thyroid echotexture is heterogeneous.  Right lobe: Measures 3 x 1.2 x 1.1 cm.  Left lobe: Measures 2.9 x 1.9 x 0.9 cm.  Isthmus: Measures 0.4 cm.  Focal lesions: Within the lower pole of the right lobe of thyroid gland there is a hypoechoic nodule containing coarsened calcifications. This measures 7 x 7 x 7 mm.  No lymphadenopathy identified. - thyroid U/S 03/22/2014: Right thyroid lobe: 3.4 x 1.1 x 1.0 cm. Stable to slightly smaller focal area of nodularity with dystrophic shadowing calcifications measures approximately 0.6 cm in diameter. The thyroid parenchyma is very heterogeneous and shows no abnormal vascularity. Left thyroid lobe: 2.9 x 1.1 x 1.1 cm. No nodules visualized. Heterogeneous parenchyma noted without abnormal vascularity. The left lobe is small in size.  Isthmus Thickness: 0.9 cm.  No nodules  visualized. Lymphadenopathy None visualized.  3. Hypothyroidism   4. vitamin D deficiency  5. OP  Plan:     1.  History of primary Hyperparathyroidism: -Patient with history of primary hyperparathyroidism with minimally elevated calcium but high PTH levels in the past.  She is now s/p parathyroidectomy in 02/2013 by Dr. Gerrit Friends.  At that time, she had resection of a 0.5 g adenoma.  Subsequent calcium and PTH levels were normal. -Most recent calcium level was normal in 12/2019: Lab Results  Component Value Date   CALCIUM 9.9 12/10/2019   CALCIUM 9.5 09/16/2019  -We will recheck her calcium level today  2. R thyroid nodule -She had a 7 mm thyroid nodule with internal calcifications which was stable and even smaller on the latest thyroid ultrasound -No neck compression symptoms -No follow-up ultrasound needed unless she becomes symptomatic with neck compression symptoms or a nodule becomes obvious on palpation  3. Hypothyroidism - latest thyroid labs reviewed with pt. >> normal: Lab Results  Component Value Date   TSH 0.57 09/16/2019  - she continues on Synthroid d.a.w. 100 mcg 6/7 days and 50 mcg 1/7 days mcg daily - pt feels good on this dose. - we discussed about taking the thyroid hormone every day, with water, >30 minutes before breakfast, separated by >4 hours from acid reflux medications, calcium, iron, multivitamins. Pt. is taking it correctly. - will check thyroid tests today: TSH and fT4 - If labs are abnormal,  she will need to return for repeat TFTs in 1.5 months  4. Vit D deficiency -Her vitamin D level was normal at 72.9 in 09/2019 -Continue 4000 units vitamin D daily -We will check a vitamin D level today  5.  Osteoporosis  -We reviewed previous DXA scan reports: She had osteoporosis at the radius before her parathyroid surgery.  Bone mets the scan from 2018 showed an improvement in the T-scores, however, they were checked on another bone density machine.  She had  a repeat DXA scan in 11/2018 and this showed worsening of her spine and hips T-scores.   -After the above results returned, we discussed about possible treatment for her osteoporosis and I suggested Prolia.  We discussed at length about possible side effects but also the very good safety profile and magnitude the fracture risk reduction.  Her co-pay for Prolia injection was $255.  She did not start Prolia at last visit that she was afraid of side effects.  We again reviewed them then and I explained that these were very rare.  However, at last visit, I also discussed with her about alendronate versus zoledronic acid.  At that time, she wanted to think about these and I gave her written information about both.  She did not decide to start them yet. -She is due for another DXA scan and we will order this today.  We will decide about further treatment after the results are back.  Orders Placed This Encounter  Procedures   DG Bone Density   TSH   T4, free   Basic metabolic panel   VITAMIN D 25 Hydroxy (Vit-D Deficiency, Fractures)   Component     Latest Ref Rng & Units 11/22/2020  Sodium     135 - 145 mEq/L 136  Potassium     3.5 - 5.1 mEq/L 4.1  Chloride     96 - 112 mEq/L 104  CO2     19 - 32 mEq/L 24  Glucose     70 - 99 mg/dL 66 (L)  BUN     6 - 23 mg/dL 11  Creatinine     9.600.40 - 1.20 mg/dL 4.540.79  Calcium     8.4 - 10.5 mg/dL 9.0  GFR     >09.81>60.00 mL/min 74.01  TSH     0.35 - 5.50 uIU/mL 0.15 (L)  T4,Free(Direct)     0.60 - 1.60 ng/dL 1.911.31  VITD     [id number] - 100.00 ng/mL 65.97  Tests are normal, with the exception of a slightly low TSH.  Since the trend was towards a decreased TSH, I would suggest to decrease the dose of levothyroxine from ~92 mcg daily to ~85 mcg daily by taking the 50 mcg dose 2 times a week. Will need a repeat set of TFTs in 1.5 months.  Carlus Pavlovristina Nevyn Bossman, MD PhD [facility]

## 2020-11-30 ENCOUNTER — Ambulatory Visit: Payer: Medicare Other | Admitting: Clinical

## 2020-12-05 ENCOUNTER — Ambulatory Visit (HOSPITAL_BASED_OUTPATIENT_CLINIC_OR_DEPARTMENT_OTHER)
Admission: RE | Admit: 2020-12-05 | Discharge: 2020-12-05 | Disposition: A | Discharge status: Home / Self Care | Payer: Medicare Other | Source: Ambulatory Visit | Attending: Internal Medicine | Admitting: Internal Medicine

## 2020-12-05 ENCOUNTER — Other Ambulatory Visit: Payer: Self-pay

## 2020-12-05 DIAGNOSIS — Z13820 Encounter for screening for osteoporosis: Secondary | ICD-10-CM | POA: Diagnosis not present

## 2020-12-05 DIAGNOSIS — M8080XD Other osteoporosis with current pathological fracture, unspecified site, subsequent encounter for fracture with routine healing: Secondary | ICD-10-CM | POA: Diagnosis not present

## 2020-12-05 DIAGNOSIS — Z780 Asymptomatic menopausal state: Secondary | ICD-10-CM | POA: Diagnosis not present

## 2020-12-08 ENCOUNTER — Other Ambulatory Visit: Payer: Self-pay | Admitting: Family Medicine

## 2020-12-17 ENCOUNTER — Other Ambulatory Visit: Payer: Self-pay | Admitting: Family Medicine

## 2020-12-17 DIAGNOSIS — F339 Major depressive disorder, recurrent, unspecified: Secondary | ICD-10-CM

## 2020-12-17 DIAGNOSIS — K219 Gastro-esophageal reflux disease without esophagitis: Secondary | ICD-10-CM

## 2021-01-03 ENCOUNTER — Other Ambulatory Visit: Payer: Self-pay

## 2021-01-03 ENCOUNTER — Other Ambulatory Visit (INDEPENDENT_AMBULATORY_CARE_PROVIDER_SITE_OTHER): Payer: Medicare Other

## 2021-01-03 DIAGNOSIS — E039 Hypothyroidism, unspecified: Secondary | ICD-10-CM | POA: Diagnosis not present

## 2021-01-03 LAB — T4, FREE: Free T4: 0.99 ng/dL (ref 0.60–1.60)

## 2021-01-03 LAB — TSH: TSH: 0.45 u[IU]/mL (ref 0.35–5.50)

## 2021-01-10 ENCOUNTER — Other Ambulatory Visit: Payer: Self-pay | Admitting: Family Medicine

## 2021-01-13 ENCOUNTER — Ambulatory Visit (INDEPENDENT_AMBULATORY_CARE_PROVIDER_SITE_OTHER): Payer: Medicare Other | Admitting: Clinical

## 2021-01-13 DIAGNOSIS — F332 Major depressive disorder, recurrent severe without psychotic features: Secondary | ICD-10-CM

## 2021-01-14 ENCOUNTER — Ambulatory Visit (INDEPENDENT_AMBULATORY_CARE_PROVIDER_SITE_OTHER): Payer: Medicare Other

## 2021-01-14 VITALS — Ht 65.0 in | Wt 201.0 lb

## 2021-01-14 DIAGNOSIS — Z0000 Encounter for general adult medical examination without abnormal findings: Secondary | ICD-10-CM

## 2021-01-14 NOTE — Patient Instructions (Signed)
Gail Fuentes , Thank you for taking time to come for your Medicare Wellness Visit. I appreciate your ongoing commitment to your health goals. Please review the following plan we discussed and let me know if I can assist you in the future.   Screening recommendations/referrals: Colonoscopy: Done 11/19/2014 - Repeat in 10 years  Mammogram: Done annually at [facility] - we need records Bone Density: Done 12/05/2020 - Repeat every 2 years Recommended yearly ophthalmology/optometry visit for glaucoma screening and checkup Recommended yearly dental visit for hygiene and checkup  Vaccinations: Influenza vaccine: Due every fall Pneumococcal vaccine: Done 07/18/2015 & 03/06/2013 Tdap vaccine: Done 07/18/2015 - Repeat in 10 years Shingles vaccine: Due. 2 doses 2-6 months apart - over 90% effective   Covid-19:Done 06/08/2019, 06/28/2019, & 04/21/2020 - due for second booster  Advanced directives: Please bring a copy of your health care power of attorney and living will to the office to be added to your chart at your convenience.   Conditions/risks identified: Aim for 30 minutes of exercise or brisk walking each day, drink 6-8 glasses of water and eat lots of fruits and vegetables.   Next appointment: Follow up in one year for your annual wellness visit    Preventive Care 65 Years and Older, Female Preventive care refers to lifestyle choices and visits with your health care provider that can promote health and wellness. What does preventive care include? A yearly physical exam. This is also called an annual well check. Dental exams once or twice a year. Routine eye exams. Ask your health care provider how often you should have your eyes checked. Personal lifestyle choices, including: Daily care of your teeth and gums. Regular physical activity. Eating a healthy diet. Avoiding tobacco and drug use. Limiting alcohol use. Practicing safe sex. Taking low-dose aspirin every day. Taking vitamin and  mineral supplements as recommended by your health care provider. What happens during an annual well check? The services and screenings done by your health care provider during your annual well check will depend on your age, overall health, lifestyle risk factors, and family history of disease. Counseling  Your health care provider may ask you questions about your: Alcohol use. Tobacco use. Drug use. Emotional well-being. Home and relationship well-being. Sexual activity. Eating habits. History of falls. Memory and ability to understand (cognition). Work and work Astronomer. Reproductive health. Screening  You may have the following tests or measurements: Height, weight, and BMI. Blood pressure. Lipid and cholesterol levels. These may be checked every 5 years, or more frequently if you are over 59 years old. Skin check. Lung cancer screening. You may have this screening every year starting at age 74 if you have a 30-pack-year history of smoking and currently smoke or have quit within the past 15 years. Fecal occult blood test (FOBT) of the stool. You may have this test every year starting at age 74 Flexible sigmoidoscopy or colonoscopy. You may have a sigmoidoscopy every 5 years or a colonoscopy every 10 years starting at age 74 Hepatitis C blood test. Hepatitis B blood test. Sexually transmitted disease (STD) testing. Diabetes screening. This is done by checking your blood sugar (glucose) after you have not eaten for a while (fasting). You may have this done every 1-3 years. Bone density scan. This is done to screen for osteoporosis. You may have this done starting at age 74 Mammogram. This may be done every 1-2 years. Talk to your health care provider about how often you should have regular mammograms. Talk  with your health care provider about your test results, treatment options, and if necessary, the need for more tests. Vaccines  Your health care provider may recommend  certain vaccines, such as: Influenza vaccine. This is recommended every year. Tetanus, diphtheria, and acellular pertussis (Tdap, Td) vaccine. You may need a Td booster every 10 years. Zoster vaccine. You may need this after age 74 Pneumococcal 13-valent conjugate (PCV13) vaccine. One dose is recommended after age 27. Pneumococcal polysaccharide (PPSV23) vaccine. One dose is recommended after age 74 Talk to your health care provider about which screenings and vaccines you need and how often you need them. This information is not intended to replace advice given to you by your health care provider. Make sure you discuss any questions you have with your health care provider. Document Released: 05/20/2015 Document Revised: 01/11/2016 Document Reviewed: 02/22/2015 Elsevier Interactive Patient Education  2017 [place] Prevention in the Home Falls can cause injuries. They can happen to people of all ages. There are many things you can do to make your home safe and to help prevent falls. What can I do on the outside of my home? Regularly fix the edges of walkways and driveways and fix any cracks. Remove anything that might make you trip as you walk through a door, such as a raised step or threshold. Trim any bushes or trees on the path to your home. Use bright outdoor lighting. Clear any walking paths of anything that might make someone trip, such as rocks or tools. Regularly check to see if handrails are loose or broken. Make sure that both sides of any steps have handrails. Any raised decks and porches should have guardrails on the edges. Have any leaves, snow, or ice cleared regularly. Use sand or salt on walking paths during winter. Clean up any spills in your garage right away. This includes oil or grease spills. What can I do in the bathroom? Use night lights. Install grab bars by the toilet and in the tub and shower. Do not use towel bars as grab bars. Use non-skid mats or  decals in the tub or shower. If you need to sit down in the shower, use a plastic, non-slip stool. Keep the floor dry. Clean up any water that spills on the floor as soon as it happens. Remove soap buildup in the tub or shower regularly. Attach bath mats securely with double-sided non-slip rug tape. Do not have throw rugs and other things on the floor that can make you trip. What can I do in the bedroom? Use night lights. Make sure that you have a light by your bed that is easy to reach. Do not use any sheets or blankets that are too big for your bed. They should not hang down onto the floor. Have a firm chair that has side arms. You can use this for support while you get dressed. Do not have throw rugs and other things on the floor that can make you trip. What can I do in the kitchen? Clean up any spills right away. Avoid walking on wet floors. Keep items that you use a lot in easy-to-reach places. If you need to reach something above you, use a strong step stool that has a grab bar. Keep electrical cords out of the way. Do not use floor polish or wax that makes floors slippery. If you must use wax, use non-skid floor wax. Do not have throw rugs and other things on the floor that can make you  trip. What can I do with my stairs? Do not leave any items on the stairs. Make sure that there are handrails on both sides of the stairs and use them. Fix handrails that are broken or loose. Make sure that handrails are as long as the stairways. Check any carpeting to make sure that it is firmly attached to the stairs. Fix any carpet that is loose or worn. Avoid having throw rugs at the top or bottom of the stairs. If you do have throw rugs, attach them to the floor with carpet tape. Make sure that you have a light switch at the top of the stairs and the bottom of the stairs. If you do not have them, ask someone to add them for you. What else can I do to help prevent falls? Wear shoes that: Do not  have high heels. Have rubber bottoms. Are comfortable and fit you well. Are closed at the toe. Do not wear sandals. If you use a stepladder: Make sure that it is fully opened. Do not climb a closed stepladder. Make sure that both sides of the stepladder are locked into place. Ask someone to hold it for you, if possible. Clearly mark and make sure that you can see: Any grab bars or handrails. First and last steps. Where the edge of each step is. Use tools that help you move around (mobility aids) if they are needed. These include: Canes. Walkers. Scooters. Crutches. Turn on the lights when you go into a dark area. Replace any light bulbs as soon as they burn out. Set up your furniture so you have a clear path. Avoid moving your furniture around. If any of your floors are uneven, fix them. If there are any pets around you, be aware of where they are. Review your medicines with your doctor. Some medicines can make you feel dizzy. This can increase your chance of falling. Ask your doctor what other things that you can do to help prevent falls. This information is not intended to replace advice given to you by your health care provider. Make sure you discuss any questions you have with your health care provider. Document Released: 02/17/2009 Document Revised: 09/29/2015 Document Reviewed: 05/28/2014 Elsevier Interactive Patient Education  2017 [employer]   Eating Plan for Osteoporosis Osteoporosis causes your bones to become weak and brittle. This puts you at greater risk for bone breaks (fractures) from small bumps or falls. Making changes to your diet and increasing your physical activity can help strengthen your bones and improve your overall health. Calcium and vitamin D are nutrients that play an important role in bone health. Vitamin D helps your body use calcium and strengthen bones. It is important to get enough calcium and vitamin D as part of your eating plan for  osteoporosis. What are tips for following this plan? Reading food labels Try to get at least 1,000 milligrams (mg) of calcium each day. Look for foods that have at least 50 mg of calcium per serving. Talk with your health care provider about taking a calcium supplement if you do not get enough calcium from food. Do not have more than 2,500 mg of calcium each day. This is the upper limit for food and nutritional supplements combined. Too much calcium may cause constipation and prevent you from absorbing other important nutrients. Choose foods that contain vitamin D. Take a daily vitamin supplement that contains 800-1,000 international units (IU) of vitamin D. The amount may be different depending on your age, body  weight, and where you live. Talk with your dietitian or health care provider about how much vitamin D is right for you. Avoid foods that have more than 300 mg of sodium per serving. Too much sodium can cause your body to lose calcium. Talk with your dietitian or health care provider about how much sodium you are allowed each day. Shopping Do not buy foods with added salt, including: Salted snacks. [place]. Canned soups. Canned meats. Processed meats, such as bacon or precooked or cured meat like sausages or meat loaves. Smoked fish. Meal planning Eat balanced meals that contain protein foods, fruits and vegetables, and foods rich in calcium and vitamin D. Eat at least 5 servings of fruits and vegetables each day. Eat 5-6 oz (142-170 g) of lean meat, poultry, fish, eggs, or beans each day. Lifestyle Do not use any products that contain nicotine or tobacco, such as cigarettes, e-cigarettes, and chewing tobacco. If you need help quitting, ask your health care provider. If your health care provider recommends that you lose weight: Work with a dietitian to develop an eating plan that will help you reach your desired weight goal. Exercise for at least 30 minutes a day, 5 or more days a  week, or as told by your health care provider. Work with a physical therapist to develop an exercise plan that includes flexibility, balance, and strength exercises. Do not focus only on aerobic exercise. Do not drink alcohol if: Your health care provider tells you not to drink. You are pregnant, may be pregnant, or are planning to become pregnant. If you drink alcohol: Limit how much you use to: 0-1 drink a day for women. 0-2 drinks a day for men. Be aware of how much alcohol is in your drink. In the [place] one drink equals one 12 oz bottle of beer (355 mL), one 5 oz glass of wine (148 mL), or one 1 oz glass of hard liquor (44 mL). What foods should I eat? Foods high in calcium  Yogurt. Yogurt with fruit. Milk. Evaporated skim milk. Dry milk powder. Calcium-fortified orange juice. Parmesan cheese. Part-skim ricotta cheese. Natural hard cheese. Cream cheese. Cottage cheese. Canned sardines. Canned salmon. Calcium-treated tofu. Calcium-fortified cereal bar. Calcium-fortified cereal. Calcium-fortified graham crackers. Cooked collard greens. Turnip greens. Broccoli. Kale. Almonds. White beans. Corn tortilla. Foods high in vitamin D Cod liver oil. Fatty fish, such as tuna, mackerel, and salmon. Milk. Fortified soy milk. Fortified fruit juice. Yogurt. Margarine. Egg yolks. Foods high in protein Beef. Lamb. Pork tenderloin. Chicken breast. Tuna (canned). Fish fillet. Tofu. Cooked soy beans. Soy patty. Beans (canned or cooked). Cottage cheese. Yogurt. Peanut butter. Pumpkin seeds. Nuts. Sunflower seeds. Hard cheese. Milk or other milk products, such as soy milk. The items listed above may not be a complete list of foods and beverages you can eat. Contact a dietitian for more options. Summary Calcium and vitamin D are nutrients that play an important role in bone health and are an important part of your eating plan for osteoporosis. Eat balanced meals that contain protein foods,  fruits and vegetables, and foods rich in calcium and vitamin D. Avoid foods that have more than 300 mg of sodium per serving. Too much sodium can cause your body to lose calcium. Exercise is an important part of prevention and treatment of osteoporosis. Aim for at least 30 minutes a day, 5 days a week. This information is not intended to replace advice given to you by your health care provider. Make sure you discuss  any questions you have with your health care provider. Document Revised: 10/08/2019 Document Reviewed: 10/08/2019 Elsevier Patient Education  2022 [employer].

## 2021-01-14 NOTE — Progress Notes (Signed)
Subjective:   Gail Fuentes is a 75 y.o. female who presents for Medicare Annual (Subsequent) preventive examination.  Virtual Visit via Telephone Note  I connected with  Gail Fuentes on 01/14/21 at 10:00 AM EDT by telephone and verified that I am speaking with the correct person using two identifiers.  Location: Patient: Home Provider: [facility] Persons participating in the virtual visit: patient/Nurse Health Advisor   I discussed the limitations, risks, security and privacy concerns of performing an evaluation and management service by telephone and the availability of in person appointments. The patient expressed understanding and agreed to proceed.  Interactive audio and video telecommunications were attempted between this nurse and patient, however failed, due to patient having technical difficulties OR patient did not have access to video capability.  We continued and completed visit with audio only.  Some vital signs may be absent or patient reported.   Alvester Eads E Brevon Dewald, LPN   Review of Systems     Cardiac Risk Factors include: advanced age (>84men, >33 women);dyslipidemia;hypertension;obesity (BMI >30kg/m2)     Objective:    Today's Vitals   01/14/21 0954  Weight: 201 lb (91.2 kg)  Height: 5\' 5"  (1.651 m)   Body mass index is 33.45 kg/m.  Advanced Directives 01/14/2021 11/27/2018 11/13/2018 11/22/2017 11/21/2016 11/19/2014 02/10/2013  Does Patient Have a Medical Advance Directive? Yes Yes Yes Yes Yes Yes Patient has advance directive, copy not in chart  Type of Advance Directive Healthcare Power of [place];Living will Healthcare Power of [place];Living will - Healthcare Power of [place];Living will Healthcare Power of [place];Living will Healthcare Power of [place];Living will Healthcare Power of [place];Living will  Does patient want to make changes to medical advance directive? - No - Patient declined No - Patient declined - - - -  Copy of Healthcare Power of  Attorney in Chart? No - copy requested No - copy requested - No - copy requested No - copy requested - Copy requested from family    Current Medications (verified) Outpatient Encounter Medications as of 01/14/2021  Medication Sig   ALPRAZolam (XANAX) 0.5 MG tablet TAKE 0.5-1 TABLETS (0.25-0.5 MG TOTAL) BY MOUTH 2 (TWO) TIMES DAILY AS NEEDED FOR ANXIETY.   Cholecalciferol (VITAMIN D3) 2000 UNITS TABS Take 2 tablets by mouth daily.    cyanocobalamin 100 MCG tablet Take 100 mcg by mouth daily.   FLUoxetine (PROZAC) 20 MG capsule TAKE 1 CAPSULE (20 MG TOTAL) BY MOUTH DAILY. INCREASE TO 2 CAPSULES AFTER 1-2 WEEKS   fluticasone (FLONASE) 50 MCG/ACT nasal spray Place 1 spray into both nostrils daily.   levothyroxine (SYNTHROID) 100 MCG tablet TAKE 1 TABLET BY MOUTH 5 DAYS A WEEK AND 1/2 TABLET 2 days a week   losartan (COZAAR) 25 MG tablet TAKE 2 TABLETS BY MOUTH EVERY DAY   pantoprazole (PROTONIX) 40 MG tablet TAKE 1 TABLET BY MOUTH EVERY DAY   No facility-administered encounter medications on file as of 01/14/2021.    Allergies (verified) Augmentin [amoxicillin-pot clavulanate]   History: Past Medical History:  Diagnosis Date   Abnormal EKG    per patient- states evaluated by Dr Daleen Squibb in the past   Allergy    sneezing  late Fall   Anemia 01/23/12   H/H 11.7/35   Anxiety state, unspecified    Arthritis    Benign paroxysmal positional vertigo    Depression    Depressive disorder, not elsewhere classified    Diverticulosis    Esophageal reflux    Esophageal stricture  Gallstones    Heart murmur    Hiatal hernia    History of hyperparathyroidism    History of UTI    HTN (hypertension)    Hyperlipidemia    OSA (obstructive sleep apnea) 05/27/2018   Other abnormal glucose    Personal history of other diseases of digestive system    12-15 since 1990, no recurrence 2006   PONV (postoperative nausea and vomiting)    Unspecified hypothyroidism    Vitamin D deficiency    Past  Surgical History:  Procedure Laterality Date   CATARACT EXTRACTION, BILATERAL Bilateral 12/06/2019   [facility]   CESAREAN SECTION  05/08/1975   CHOLECYSTECTOMY  05/07/2002   COLONOSCOPY  12/06/2003   [facility]   DENTAL SURGERY  1962, 2012   Age 36-Abscessed Teeth Extracted    PARATHYROIDECTOMY Left 02/13/2013   Procedure: LEFT INFERIOR PARATHYROIDECTOMY with frozen section ;  Surgeon: Velora Hecklerodd M Gerkin, MD;  Location: [facility];  Service: General;  Laterality: Left;   TOTAL ABDOMINAL HYSTERECTOMY W/ BILATERAL SALPINGOOPHORECTOMY  05/08/2003   Ovarian Cyst   Family History  Problem Relation Age of Onset   Coronary artery disease Father    Heart attack Father [id number]   COPD Father    Diabetes Mother    Dysrhythmia Mother    Depression Mother    COPD Mother    Colon polyps Mother    Kidney disease Mother    Prostate cancer Brother    Other Brother        [facility]   Heart disease Brother    Arthritis Brother    Hypertension Brother    Prostate cancer Brother    Cancer Brother        Soft tissue in the face/temple   Stroke Maternal Aunt        >65   Stroke Maternal Grandmother        >65   Stroke Maternal Grandfather [id number]   Social History   Socioeconomic History   Marital status: Married    Spouse name: Not on file   Number of children: 1   Years of education: Not on file   Highest education level: Not on file  Occupational History   Occupation: [employer]    Employer: RETIRED   Occupation: retired Visual merchandiserclerical worker  Tobacco Use   Smoking status: Former    Types: Cigarettes    Quit date: 05/07/1973    Years since quitting: 47.7   Smokeless tobacco: Never   Tobacco comments:    Smoked 1968-1975 up to 2 ppd  Vaping Use   Vaping Use: Never used  Substance and Sexual Activity   Alcohol use: Yes    Alcohol/week: 14.0 standard drinks    Types: 14 Standard drinks or equivalent per week    Comment: 2 nightly wines   Drug use: No   Sexual activity: Not  Currently  Other Topics Concern   Not on file  Social History Narrative   Regular exercise: no   Caffeine use: daily   Social Determinants of Health   Financial Resource Strain: Low Risk    Difficulty of Paying Living Expenses: Not hard at all  Food Insecurity: No Food Insecurity   Worried About Programme researcher, broadcasting/film/videounning Out of Food in the Last Year: Never true   Ran Out of Food in the Last Year: Never true  Transportation Needs: No Transportation Needs   Lack of Transportation (Medical): No   Lack of Transportation (Non-Medical): No  Physical Activity:  Insufficiently Active   Days of Exercise per Week: 3 days   Minutes of Exercise per Session: 30 min  Stress: Stress Concern Present   Feeling of Stress : To some extent  Social Connections: Moderately Integrated   Frequency of Communication with Friends and Family: More than three times a week   Frequency of Social Gatherings with Friends and Family: More than three times a week   Attends Religious Services: Never   Database administrator or Organizations: Yes   Attends Banker Meetings: 1 to 4 times per year   Marital Status: Married    Tobacco Counseling Counseling given: Not Answered Tobacco comments: Smoked [phone] up to 2 ppd   Clinical Intake:  Pre-visit preparation completed: Yes  Pain : No/denies pain     BMI - recorded: 33.45 Nutritional Status: BMI > 30  Obese Nutritional Risks: None Diabetes: No CBG done?: No Did pt. bring in CBG monitor from home?: No  How often do you need to have someone help you when you read instructions, pamphlets, or other written materials from your doctor or pharmacy?: 1 - Never  Diabetic? No  Interpreter Needed?: No  Information entered by :: Eric Morganti, LPN   Activities of Daily Living In your present state of health, do you have any difficulty performing the following activities: 01/14/2021  Hearing? N  Vision? N  Difficulty concentrating or making decisions? N   Walking or climbing stairs? Y  Comment hurts left knee  Dressing or bathing? N  Doing errands, shopping? N  Preparing Food and eating ? N  Using the Toilet? N  In the past six months, have you accidently leaked urine? Y  Comment if she strains  Do you have problems with loss of bowel control? N  Managing your Medications? N  Managing your Finances? N  Housekeeping or managing your Housekeeping? N  Some recent data might be hidden    Patient Care Team: Copland, Gwenlyn Found, MD as PCP - General (Family Medicine) Kathleene Hazel, MD as PCP - Cardiology (Cardiology) Carlus Pavlov, MD as Consulting Physician (Internal Medicine) Freddy Finner, MD as Consulting Physician (Obstetrics and Gynecology) Manning Charity, OD as Referring Physician (Optometry)  Indicate any recent Medical Services you may have received from other than [facility] providers in the past year (date may be approximate).     Assessment:   This is a routine wellness examination for Gail Fuentes.  Hearing/Vision screen Hearing Screening - Comments:: Denies hearing difficulties  Vision Screening - Comments:: Wears reading glasses prn only - up to date with annual eye exams with Emily Filbert  Dietary issues and exercise activities discussed: Current Exercise Habits: Home exercise routine, Type of exercise: walking, Time (Minutes): 30, Frequency (Times/Week): 3, Weekly Exercise (Minutes/Week): 90, Intensity: Mild, Exercise limited by: orthopedic condition(s)   Goals Addressed             This Visit's Progress    To be out and more social.       Wants to try to forget about the past Live in the present - stay healthy Do not create stress/worry before it occurs       Depression Screen PHQ 2/9 Scores 01/14/2021 11/02/2019 11/27/2018 11/22/2017 11/21/2016 11/21/2016 11/17/2015  PHQ - 2 Score 2 2 1 1  0 0 0  PHQ- 9 Score 7 9 - - - - -    Fall Risk Fall Risk  01/14/2021 11/04/2020 11/27/2018 11/22/2017 11/21/2016  Falls in the  past year? 0  0 1 No No  Number falls in past yr: 0 0 0 - -  Injury with Fall? 0 0 1 - -  Risk for fall due to : Orthopedic patient;Medication side effect - - - -  Follow up Falls prevention discussed - - - -    FALL RISK PREVENTION PERTAINING TO THE HOME:  Any stairs in or around the home? Yes  If so, are there any without handrails? No  Home free of loose throw rugs in walkways, pet beds, electrical cords, etc? Yes  Adequate lighting in your home to reduce risk of falls? Yes   ASSISTIVE DEVICES UTILIZED TO PREVENT FALLS:  Life alert? No  Use of a cane, walker or w/c? No  Grab bars in the bathroom? No  Shower chair or bench in shower? Yes  Elevated toilet seat or a handicapped toilet? No   TIMED UP AND GO:  Was the test performed? No . Telephonic visit  Cognitive Function: Normal cognitive status assessed by direct observation by this Nurse Health Advisor. No abnormalities found.    MMSE - Mini Mental State Exam 11/22/2017  Orientation to time 5  Orientation to Place 5  Registration 3  Attention/ Calculation 5  Recall 3  Language- name 2 objects 2  Language- repeat 1  Language- follow 3 step command 3  Language- read & follow direction 1  Write a sentence 1  Copy design 1  Total score 30        Immunizations Immunization History  Administered Date(s) Administered   Influenza Split 03/11/2012, 02/22/2017   Influenza Whole 02/20/2008, 03/06/2010   Influenza, High Dose Seasonal PF 03/17/2013, 02/22/2017, 01/15/2018   Influenza,inj,Quad PF,6+ Mos 03/04/2014   Influenza-Unspecified 03/08/2015, 03/06/2016, 01/27/2018   Moderna Sars-Covid-2 Vaccination 06/08/2019, 06/28/2019   PFIZER(Purple Top)SARS-COV-2 Vaccination 04/21/2020   Pneumococcal Conjugate-13 07/18/2015   Pneumococcal Polysaccharide-23 03/06/2013   Td 07/18/2015    TDAP status: Up to date  Flu Vaccine status: Due, Education has been provided regarding the importance of this vaccine. Advised may  receive this vaccine at local pharmacy or Health Dept. Aware to provide a copy of the vaccination record if obtained from local pharmacy or Health Dept. Verbalized acceptance and understanding.  Pneumococcal vaccine status: Up to date  Covid-19 vaccine status: Completed vaccines  Qualifies for Shingles Vaccine? Yes   Zostavax completed No   Shingrix Completed?: No.    Education has been provided regarding the importance of this vaccine. Patient has been advised to call insurance company to determine out of pocket expense if they have not yet received this vaccine. Advised may also receive vaccine at local pharmacy or Health Dept. Verbalized acceptance and understanding.  Screening Tests Health Maintenance  Topic Date Due   Zoster Vaccines- Shingrix (1 of 2) Never done   COVID-19 Vaccine (4 - Booster) 08/20/2020   INFLUENZA VACCINE  01/22/2021 (Originally 12/05/2020)   MAMMOGRAM  01/04/2023   COLONOSCOPY (Pts 45-57yrs Insurance coverage will need to be confirmed)  11/18/2024   TETANUS/TDAP  07/17/2025   DEXA SCAN  Completed   Hepatitis C Screening  Completed   PNA vac Low Risk Adult  Completed   HPV VACCINES  Aged Out    Health Maintenance  Health Maintenance Due  Topic Date Due   Zoster Vaccines- Shingrix (1 of 2) Never done   COVID-19 Vaccine (4 - Booster) 08/20/2020    Colorectal cancer screening: Type of screening: Colonoscopy. Completed 11/19/2014. Repeat every 10 years  Mammogram status: Completed 12/2020. Repeat  every year  Bone Density status: Completed 12/05/2020. Results reflect: Bone density results: OSTEOPOROSIS. Repeat every 2 years.  Lung Cancer Screening: (Low Dose CT Chest recommended if Age 42-80 years, 30 pack-year currently smoking OR have quit w/in 15years.) does not qualify.  Additional Screening:  Hepatitis C Screening: does qualify; Completed 07/18/2015  Vision Screening: Recommended annual ophthalmology exams for early detection of glaucoma and other  disorders of the eye. Is the patient up to date with their annual eye exam?  Yes  Who is the provider or what is the name of the office in which the patient attends annual eye exams? [facility] If pt is not established with a provider, would they like to be referred to a provider to establish care? No .   Dental Screening: Recommended annual dental exams for proper oral hygiene  Community Resource Referral / Chronic Care Management: CRR required this visit?  No   CCM required this visit?  No      Plan:     I have personally reviewed and noted the following in the patient's chart:   Medical and social history Use of alcohol, tobacco or illicit drugs  Current medications and supplements including opioid prescriptions.  Functional ability and status Nutritional status Physical activity Advanced directives List of other physicians Hospitalizations, surgeries, and ER visits in previous 12 months Vitals Screenings to include cognitive, depression, and falls Referrals and appointments  In addition, I have reviewed and discussed with patient certain preventive protocols, quality metrics, and best practice recommendations. A written personalized care plan for preventive services as well as general preventive health recommendations were provided to patient.     [place] Constable, LPN   [id number]   Nurse Notes: She is having increased anxiety, but she is seeing a new therapist and is optimistic about it.

## 2021-01-14 NOTE — Progress Notes (Addendum)
Healthcare at [facility] [address], [address] [place], [place] [address] [phone]  Date:  01/25/2021   Name:  Gail Fuentes   DOB:  06-02-1945   MRN:  [id number]  PCP:  Gail Cables, MD    Chief Complaint: Gail Fuentes office visit (Concerns/ questions: None/Flu shot today: yes)   History of Present Illness:  Gail Fuentes is a 75 y.o. very pleasant female patient who presents with the following:  Pt seen today for a follow-up visit- History of cardiomegaly, hypertension, sleep apnea, hypothyroidism, hyperlipidemia, vitamin D deficiency and osteoporosis, mild pre-diabetes   Last seen by myself about one year ago Seen by Dr Elvera Lennox for her hypoparathyroidism in July- Ca normal at that time   Dr Clifton James in March: 1. Hypertensive heart disease: Echo February 2018 with mild LVH, grade 1 diastolic dysfunction. BP is controlled. No chest pain or dyspnea. Continue current therapy  Shingles vaccine- discussed today  Covid booster- encouraged bivalent vaccine  Flu vaccine- done today  Mammo per Dr Jennette Kettle Dexa UTD Colon in 2016- 10 year follow-up recommended   Alprazolam Vitamin D over-the-counter B12 over-the-counter Prozac-40 mg  Levothyroxine- recent dose adjustment, repeat TSH normal  Losartan Pantoprazole  She has 4 grandchildren, 2 in the fifth grade and 2 in the 10th grade.  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   History of hyperparathyroidism 08/24/2014   Obesity (BMI 30-39.9) 10/08/2013   HTN  (hypertension) 07/13/2013   Elevated blood pressure reading without diagnosis of hypertension 06/29/2013   Osteoporosis 11/14/2012   Acute iritis of both eyes 10/17/2012   Thyroid nodule 10/17/2012   Cardiomegaly 02/20/2012   DISTURBANCES OF SENSATION OF SMELL AND TASTE 04/19/2010   Vitamin D deficiency 12/24/2008   ANXIETY 12/24/2008   DEPRESSION 12/24/2008   BENIGN PAROXYSMAL POSITIONAL VERTIGO 12/24/2008   HYPERGLYCEMIA 12/24/2008   Nonspecific abnormal electrocardiogram (ECG) (EKG) 12/24/2008   Hyperlipidemia 07/23/2007   Hypothyroidism 07/22/2007   GERD 07/22/2007   DIVERTICULITIS, HX OF 07/22/2007    Past Medical History:  Diagnosis Date   Abnormal EKG    per patient- states evaluated by Dr Daleen Squibb in the past   Allergy    sneezing  late Fall   Anemia 01/23/12   H/H 11.7/35   Anxiety state, unspecified    Arthritis    Benign paroxysmal positional vertigo    Depression    Depressive disorder, not elsewhere classified    Diverticulosis    Esophageal reflux    Esophageal stricture    Gallstones    Heart murmur    Hiatal hernia    History of hyperparathyroidism    History of UTI    HTN (hypertension)    Hyperlipidemia    OSA (obstructive sleep apnea) 05/27/2018   Other abnormal glucose    Personal history of other diseases of digestive system    12-15 since 1990, no recurrence 2006   PONV (postoperative nausea and vomiting)    Unspecified hypothyroidism    Vitamin D deficiency  Past Surgical History:  Procedure Laterality Date   CATARACT EXTRACTION, BILATERAL Bilateral 12/06/2019   Gail Fuentes   CESAREAN SECTION  05/08/1975   CHOLECYSTECTOMY  05/07/2002   COLONOSCOPY  12/06/2003   Tics   DENTAL SURGERY  1962, 2012   Age 89-Abscessed Teeth Extracted    PARATHYROIDECTOMY Left 02/13/2013   Procedure: LEFT INFERIOR PARATHYROIDECTOMY with frozen section ;  Surgeon: Gail Heckler, MD;  Location: [facility];  Service: General;  Laterality: Left;   TOTAL ABDOMINAL  HYSTERECTOMY W/ BILATERAL SALPINGOOPHORECTOMY  05/08/2003   Ovarian Cyst    Social History   Tobacco Use   Smoking status: Former    Types: Cigarettes    Quit date: 05/07/1973    Years since quitting: 47.7   Smokeless tobacco: Never   Tobacco comments:    Smoked 1968-1975 up to 2 ppd  Vaping Use   Vaping Use: Never used  Substance Use Topics   Alcohol use: Yes    Alcohol/week: 14.0 standard drinks    Types: 14 Standard drinks or equivalent per week    Comment: 2 nightly wines   Drug use: No    Family History  Problem Relation Age of Onset   Coronary artery disease Father    Heart attack Father 54   COPD Father    Diabetes Mother    Dysrhythmia Mother    Depression Mother    COPD Mother    Colon polyps Mother    Kidney disease Mother    Prostate cancer Brother    Other Brother        Wegener's in sinus; [facility]   Heart disease Brother    Arthritis Brother    Hypertension Brother    Prostate cancer Brother    Cancer Brother        Soft tissue in the face/temple   Stroke Maternal Aunt        >65   Stroke Maternal Grandmother        >65   Stroke Maternal Grandfather 47    Allergies  Allergen Reactions   Augmentin [Amoxicillin-Pot Clavulanate] Rash    Medication list has been reviewed and updated.  Current Outpatient Medications on File Prior to Visit  Medication Sig Dispense Refill   ALPRAZolam (XANAX) 0.5 MG tablet TAKE 0.5-1 TABLETS (0.25-0.5 MG TOTAL) BY MOUTH 2 (TWO) TIMES DAILY AS NEEDED FOR ANXIETY. 30 tablet 0   Cholecalciferol (VITAMIN D3) 2000 UNITS TABS Take 2 tablets by mouth daily.      cyanocobalamin 100 MCG tablet Take 100 mcg by mouth daily.     FLUoxetine (PROZAC) 20 MG capsule TAKE 1 CAPSULE (20 MG TOTAL) BY MOUTH DAILY. INCREASE TO 2 CAPSULES AFTER 1-2 WEEKS 180 capsule 0   fluticasone (FLONASE) 50 MCG/ACT nasal spray Place 1 spray into both nostrils daily. 16 g 2   levothyroxine (SYNTHROID) 100 MCG tablet TAKE 1 TABLET BY MOUTH 5 DAYS A WEEK  AND 1/2 TABLET 2 days a week 90 tablet 3   losartan (COZAAR) 25 MG tablet TAKE 2 TABLETS BY MOUTH EVERY DAY 60 tablet 0   pantoprazole (PROTONIX) 40 MG tablet TAKE 1 TABLET BY MOUTH EVERY DAY 90 tablet 0   No current facility-administered medications on file prior to visit.    Review of Systems:  As per HPI- otherwise negative.   Physical Examination: Vitals:   01/25/21 0947  BP: 124/60  Pulse: 74  Resp: 18  Temp: (!) 97.4 F (36.3 C)  SpO2: 96%   Vitals:  01/25/21 0947  Weight: 200 lb 6.4 oz (90.9 kg)  Height: 5\' 5"  (1.651 m)   Body mass index is 33.35 kg/m. Ideal Body Weight: Weight in (lb) to have BMI = 25: 149.9  GEN: no acute distress.  Obese, looks well HEENT: Atraumatic, Normocephalic.  Ears and Nose: No external deformity. CV: RRR, No M/G/R. No JVD. No thrill. No extra heart sounds. PULM: CTA B, no wheezes, crackles, rhonchi. No retractions. No resp. distress. No accessory muscle use. ABD: S, NT, ND. No rebound. No HSM. EXTR: No c/c/e PSYCH: Normally interactive. Conversant.    Assessment and Plan: Mixed hyperlipidemia - Plan: Lipid panel  Essential hypertension - Plan: CBC, Comprehensive metabolic panel, losartan (COZAAR) 50 MG tablet  OSA (obstructive sleep apnea)  History of hyperparathyroidism  History of vitamin D deficiency  Need for influenza vaccination - Plan: Flu Vaccine QUAD High Dose(Fluad)  Depression, recurrent (HCC) - Plan: FLUoxetine (PROZAC) 40 MG capsule, ALPRAZolam (XANAX) 0.5 MG tablet  Gastroesophageal reflux disease, unspecified whether esophagitis present - Plan: pantoprazole (PROTONIX) 40 MG tablet  B12 deficiency - Plan: Vitamin B12  Seen today for periodic follow-up visit.  Her depression is well controlled on current dose of fluoxetine, refilled.  Blood pressure controlled, continue losartan  Discussed immunizations and other screening test today  Flu shot given  Will plan further follow- up pending  labs.   This visit occurred during the SARS-CoV-2 public health emergency.  Safety protocols were in place, including screening questions prior to the visit, additional usage of staff PPE, and extensive cleaning of exam room while observing appropriate contact time as indicated for disinfecting solutions.   Signed , MD  Received her labs as below, message to patient  Results for orders placed or performed in visit on 01/25/21  CBC  Result Value Ref Range   WBC 5.7 4.0 - 10.5 K/uL   RBC 4.40 3.87 - 5.11 Mil/uL   Platelets 281.0 150.0 - 400.0 K/uL   Hemoglobin 13.8 12.0 - 15.0 g/dL   HCT 01/27/21 78.2 - 95.6 %   MCV 94.6 78.0 - 100.0 fl   MCHC 33.2 30.0 - 36.0 g/dL   RDW 21.3 08.6 - 57.8 %  Comprehensive metabolic panel  Result Value Ref Range   Sodium 137 135 - 145 mEq/L   Potassium 4.5 3.5 - 5.1 mEq/L   Chloride 101 96 - 112 mEq/L   CO2 29 19 - 32 mEq/L   Glucose, Bld 90 70 - 99 mg/dL   BUN 7 6 - 23 mg/dL   Creatinine, Ser 46.9 0.40 - 1.20 mg/dL   Total Bilirubin 0.7 0.2 - 1.2 mg/dL   Alkaline Phosphatase 74 39 - 117 U/L   AST 29 0 - 37 U/L   ALT 22 0 - 35 U/L   Total Protein 6.6 6.0 - 8.3 g/dL   Albumin 4.0 3.5 - 5.2 g/dL   GFR 6.29 52.84 mL/min   Calcium 9.6 8.4 - 10.5 mg/dL  Lipid panel  Result Value Ref Range   Cholesterol 243 (H) 0 - 200 mg/dL   Triglycerides >13.24 0.0 - 149.0 mg/dL   HDL 40.1 02.72 mg/dL   VLDL >53.66 0.0 - 44.0 mg/dL   LDL Cholesterol 34.7 (H) 0 - 99 mg/dL   Total CHOL/HDL Ratio 3    NonHDL 164.51   Vitamin B12  Result Value Ref Range   Vitamin B-12 914 (H) 211 - 911 pg/mL   The 10-year ASCVD risk score (Arnett DK, et al., 2019)  is: 16.4%   Values used to calculate the score:     Age: 59 years     Sex: Female     Is Non-Hispanic African American: No     Diabetic: No     Tobacco smoker: No     Systolic Blood Pressure: 124 mmHg     Is BP treated: Yes     HDL Cholesterol: 78.9 mg/dL     Total Cholesterol: 243 mg/dL

## 2021-01-24 NOTE — Patient Instructions (Addendum)
Good to see you again today, I will be in touch with your labs as soon as possible If you have not already, please consider getting the updated COVID-19 booster this fall and also the shingles vaccine  Take care!  Please see me in about 6 months assuming all is well

## 2021-01-25 ENCOUNTER — Other Ambulatory Visit: Payer: Self-pay

## 2021-01-25 ENCOUNTER — Encounter: Payer: Self-pay | Admitting: Family Medicine

## 2021-01-25 ENCOUNTER — Ambulatory Visit (INDEPENDENT_AMBULATORY_CARE_PROVIDER_SITE_OTHER): Payer: Medicare Other | Admitting: Family Medicine

## 2021-01-25 VITALS — BP 124/60 | HR 74 | Temp 97.4°F | Resp 18 | Ht 65.0 in | Wt 200.4 lb

## 2021-01-25 DIAGNOSIS — Z8639 Personal history of other endocrine, nutritional and metabolic disease: Secondary | ICD-10-CM | POA: Diagnosis not present

## 2021-01-25 DIAGNOSIS — Z23 Encounter for immunization: Secondary | ICD-10-CM

## 2021-01-25 DIAGNOSIS — E782 Mixed hyperlipidemia: Secondary | ICD-10-CM | POA: Diagnosis not present

## 2021-01-25 DIAGNOSIS — G4733 Obstructive sleep apnea (adult) (pediatric): Secondary | ICD-10-CM

## 2021-01-25 DIAGNOSIS — E538 Deficiency of other specified B group vitamins: Secondary | ICD-10-CM | POA: Diagnosis not present

## 2021-01-25 DIAGNOSIS — I10 Essential (primary) hypertension: Secondary | ICD-10-CM | POA: Diagnosis not present

## 2021-01-25 DIAGNOSIS — F339 Major depressive disorder, recurrent, unspecified: Secondary | ICD-10-CM

## 2021-01-25 DIAGNOSIS — K219 Gastro-esophageal reflux disease without esophagitis: Secondary | ICD-10-CM

## 2021-01-25 LAB — COMPREHENSIVE METABOLIC PANEL
ALT: 22 U/L (ref 0–35)
AST: 29 U/L (ref 0–37)
Albumin: 4 g/dL (ref 3.5–5.2)
Alkaline Phosphatase: 74 U/L (ref 39–117)
BUN: 7 mg/dL (ref 6–23)
CO2: 29 mEq/L (ref 19–32)
Calcium: 9.6 mg/dL (ref 8.4–10.5)
Chloride: 101 mEq/L (ref 96–112)
Creatinine, Ser: 0.84 mg/dL (ref 0.40–1.20)
GFR: 68.67 mL/min (ref >60.00)
Glucose, Bld: 90 mg/dL (ref 70–99)
Potassium: 4.5 mEq/L (ref 3.5–5.1)
Sodium: 137 mEq/L (ref 135–145)
Total Bilirubin: 0.7 mg/dL (ref 0.2–1.2)
Total Protein: 6.6 g/dL (ref 6.0–8.3)

## 2021-01-25 LAB — LIPID PANEL
Cholesterol: 243 mg/dL — ABNORMAL HIGH (ref 0–200)
HDL: 78.9 mg/dL (ref >39.00)
LDL Cholesterol: 147 mg/dL — ABNORMAL HIGH (ref 0–99)
NonHDL: 164.51
Total CHOL/HDL Ratio: 3
Triglycerides: 87 mg/dL (ref 0.0–149.0)
VLDL: 17.4 mg/dL (ref 0.0–40.0)

## 2021-01-25 LAB — CBC
HCT: 41.7 % (ref 36.0–46.0)
Hemoglobin: 13.8 g/dL (ref 12.0–15.0)
MCHC: 33.2 g/dL (ref 30.0–36.0)
MCV: 94.6 fl (ref 78.0–100.0)
Platelets: 281 10*3/uL (ref 150.0–400.0)
RBC: 4.4 Mil/uL (ref 3.87–5.11)
RDW: 13 % (ref 11.5–15.5)
WBC: 5.7 10*3/uL (ref 4.0–10.5)

## 2021-01-25 LAB — VITAMIN B12: Vitamin B-12: 914 pg/mL — ABNORMAL HIGH (ref 211–911)

## 2021-01-25 MED ORDER — ALPRAZOLAM 0.5 MG PO TABS: 0.2500 mg | ORAL_TABLET | Freq: Two times a day (BID) | ORAL | 0 refills | Status: DC | PRN

## 2021-01-25 MED ORDER — LOSARTAN POTASSIUM 50 MG PO TABS: 50.0000 mg | ORAL_TABLET | Freq: Every day | ORAL | 3 refills | Status: DC

## 2021-01-25 MED ORDER — FLUOXETINE HCL 40 MG PO CAPS: 40.0000 mg | ORAL_CAPSULE | Freq: Every day | ORAL | 3 refills | Status: DC

## 2021-01-25 MED ORDER — PANTOPRAZOLE SODIUM 40 MG PO TBEC: 40.0000 mg | DELAYED_RELEASE_TABLET | Freq: Every day | ORAL | 3 refills | Status: DC

## 2021-02-01 IMAGING — DX RIGHT WRIST - COMPLETE 3+ VIEW
4 series · 4 of 4 positions shown · non-contrast
Comparison: 11/06/18

CLINICAL DATA: Wrist pain.  Fall.

EXAM:
RIGHT WRIST - COMPLETE 3+ VIEW

[wrist pa]
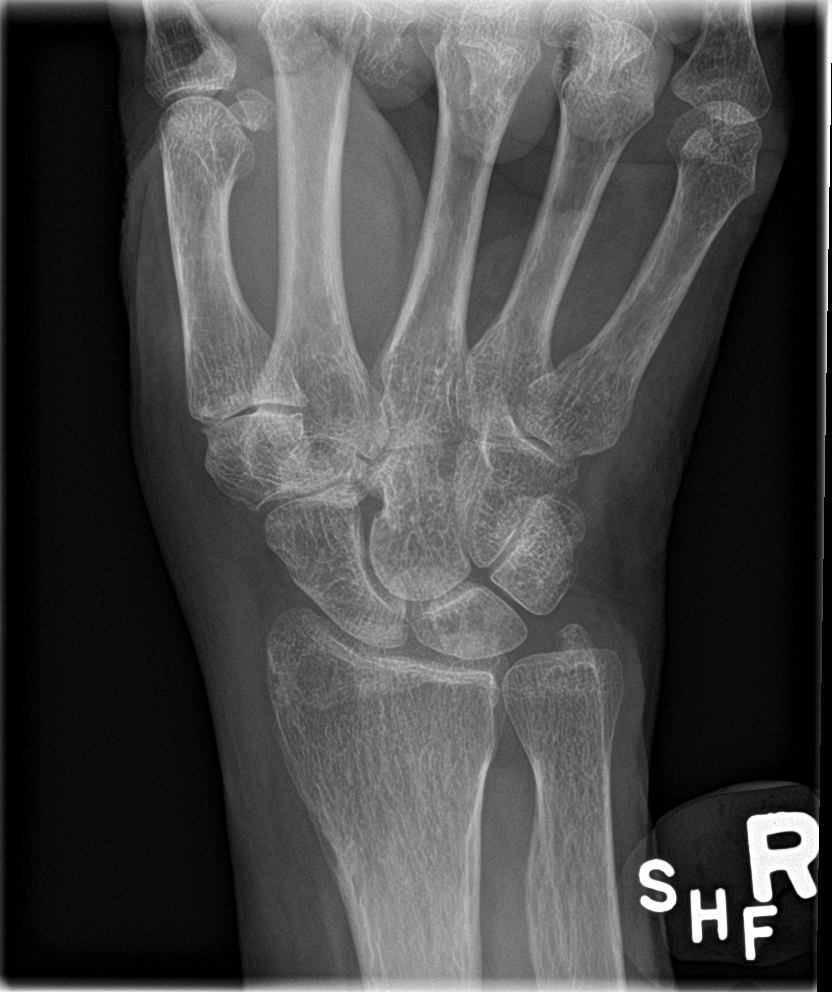

[wrist obl]
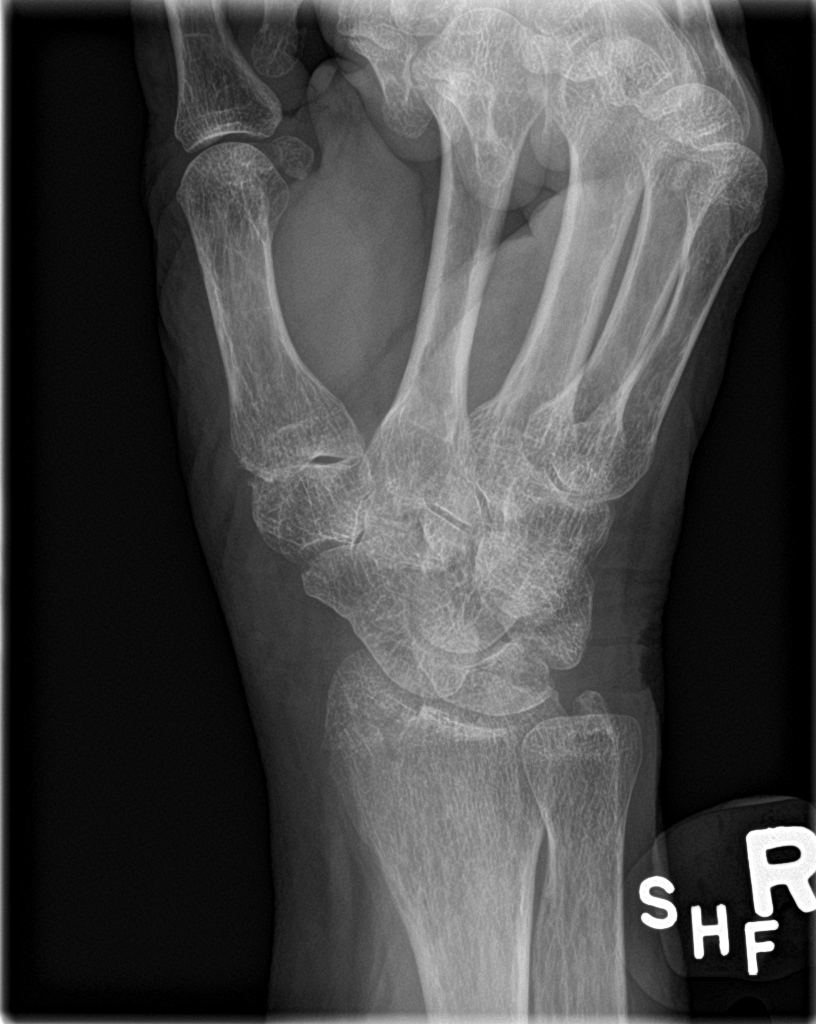

[wrist lat]
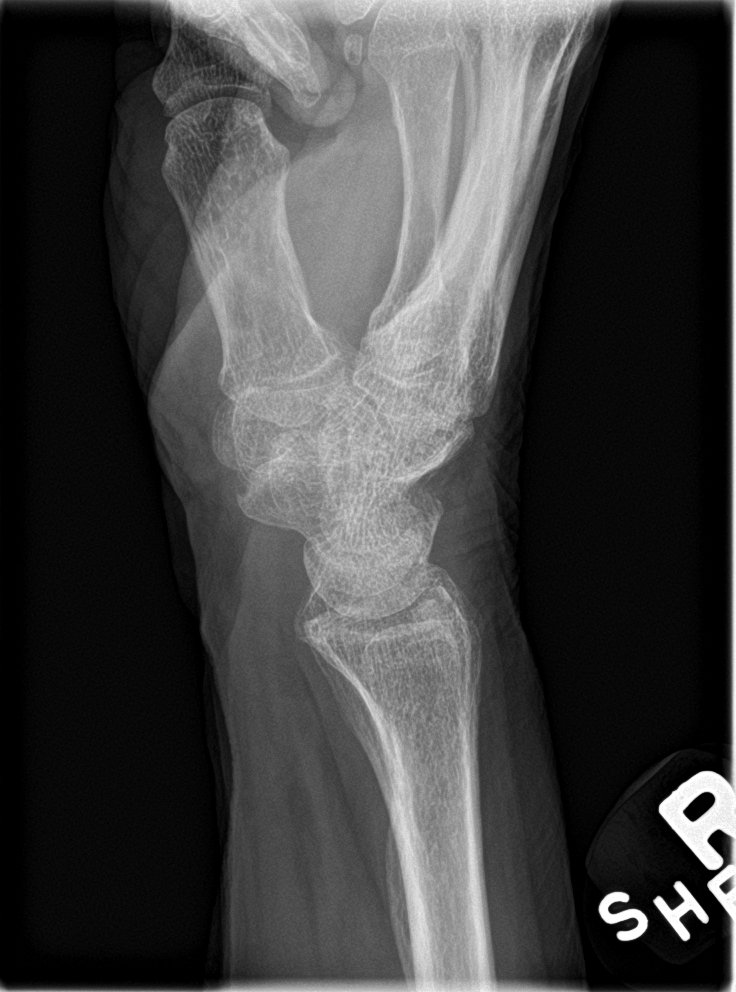

[wrist navicular]
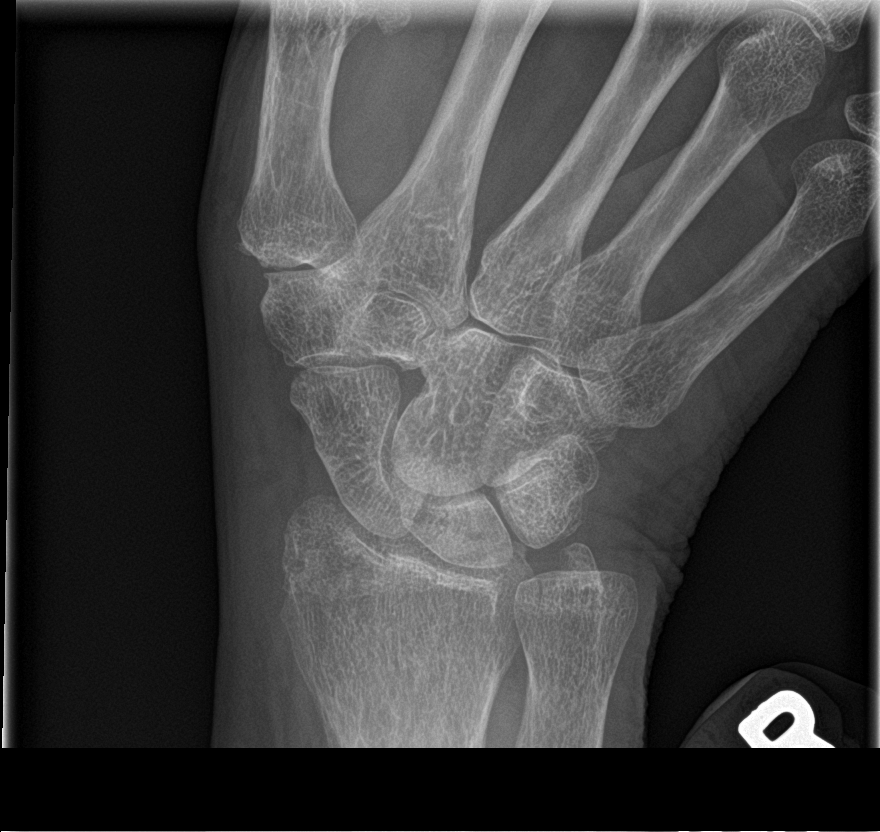

[4 of 4 positions shown; findings below may reference images not displayed]

FINDINGS: Nondisplaced, intra-articular fracture of the radial styloid is
identified and appears unchanged from previous exam. No new fracture
or dislocation.
IMPRESSION: Unchanged appearance of nondisplaced, intra-articular fracture
involving the radial styloid. No new findings.

## 2021-02-02 ENCOUNTER — Other Ambulatory Visit: Payer: Self-pay | Admitting: Family Medicine

## 2021-02-18 ENCOUNTER — Other Ambulatory Visit: Payer: Self-pay | Admitting: Internal Medicine

## 2021-03-15 ENCOUNTER — Other Ambulatory Visit: Payer: Self-pay | Admitting: Family Medicine

## 2021-03-15 DIAGNOSIS — F339 Major depressive disorder, recurrent, unspecified: Secondary | ICD-10-CM

## 2021-03-16 ENCOUNTER — Ambulatory Visit (INDEPENDENT_AMBULATORY_CARE_PROVIDER_SITE_OTHER): Payer: Medicare Other | Admitting: Clinical

## 2021-03-16 DIAGNOSIS — F332 Major depressive disorder, recurrent severe without psychotic features: Secondary | ICD-10-CM | POA: Diagnosis not present

## 2021-03-21 ENCOUNTER — Ambulatory Visit (INDEPENDENT_AMBULATORY_CARE_PROVIDER_SITE_OTHER): Payer: Medicare Other | Admitting: Clinical

## 2021-03-21 ENCOUNTER — Other Ambulatory Visit: Payer: Self-pay

## 2021-03-21 DIAGNOSIS — F332 Major depressive disorder, recurrent severe without psychotic features: Secondary | ICD-10-CM

## 2021-04-12 ENCOUNTER — Other Ambulatory Visit: Payer: Self-pay

## 2021-04-12 ENCOUNTER — Ambulatory Visit (INDEPENDENT_AMBULATORY_CARE_PROVIDER_SITE_OTHER): Payer: Medicare Other | Admitting: Clinical

## 2021-04-12 DIAGNOSIS — F331 Major depressive disorder, recurrent, moderate: Secondary | ICD-10-CM | POA: Diagnosis not present

## 2021-04-12 NOTE — Progress Notes (Signed)
Diagnosis: F33.2, Major Depression, Recurrent, Moderate Time of session: 9:08am-9:52am CPT Code: [phone] (in person)  Gail Fuentes was seen in person for individual therapy. She reported an overall reduction in her anxiety and need to "fix" situations since her last session. Session focused on continuing to process events from her belief that "the only person you can depend on is yourself." Therapist reflected with her upon the ways in which this belief was adaptive at the time that she formed it, but may no longer be. She is scheduled to be seen again in two months, and therapist will reach out as cancellations occur.   Gail Noa, PhD  Treatment Plan Client Abilities/Strengths  Gail Fuentes presented as motivated to engage in therapy and able to describe her challenges.  Client Treatment Preferences  Gail Fuentes prefers in-person appointments  Client Statement of Needs  Gail Fuentes is seeking cognitive behavioral therapy to manage symptoms of depression and anxiety.  Treatment Level  Biweekly  Symptoms  Anxiety: Excessive worry, sleep irregularity, difficulty relaxing (Status: maintained). Depression:  Depressed mood, lack of motivation, withdrawal from previously enjoyed activities, hopelessness  (Status: maintained).  Problems Addressed  New Description, New Description  Goals 1. Gail Fuentes has a history of recurrent depression Objective Gail Fuentes will increase self esteem and self compassion Target Date: 2022-01-16 Frequency: Daily Progress: 0 Modality: individual Objective Gail Fuentes will reduce anxious planning behavior and accept her personal limitations Target Date: 2022-01-16 Frequency: Biweekly Signed by: Charlyne Mom, PhD Version 2 Signed on: 2021-03-16 11:22:00 -0500 Generated on: 2022-12-07T15:59:08Z [name] DOB: 1945-11-10 Provider: Charlyne Mom, PhD Created on: 2021-03-16 11:10:24 -0500 [facility] [name] [place],  [place] [address] [phone] Progress: 0 Modality: individual Objective Gail Fuentes will improve her overall mood and sense of well-being Target Date: 2022-01-16 Frequency: Biweekly Progress: 0 Modality: individual Related Interventions 1. Therapist will provide Gail Fuentes with opportunities to process her experiences in session 2. Therapist will help Gail Fuentes to identify and disengage from maladaptive thought and behavior  patterns using CBT based strategies 3. Therapist will engage Gail Fuentes in behavior activation, including incorporation of structure,  pleasant events, and mastery events into her routine 4. Therapist will provide referrals for additional resources as appropriate  2. Gail Fuentes has difficulty setting boundaries in relationships and  prioritizing self-care Objective Gail Fuentes will develop strategies to support healthy communication and boundary setting in  relationships Target Date: 2022-01-16 Frequency: Biweekly Progress: 0 Modality: individual Related Interventions 1. Therapist will provide Gail Fuentes with communication strategies and opportunities to practice,  including writing down her thoughts, in-session processing of interactions, and in-vivo role  plays Diagnosis Axis  none 300.02 (Generalized anxiety disorder) - Open - [Signifier: n/a]  Axis  none 296.32 (Major depressive affective disorder, recurrent episode, moderate) - Open -  [Signifier: n/a]  Signed by: Charlyne Mom, PhD Version 2 Signed on: 2021-03-16 11:22:00 -0500 Generated on: 2022-12-07T15:59:08Z [name] DOB: 11/01/1945 Provider: Charlyne Mom, PhD Created on: 2021-03-16 11:10:24 -0500 [facility] [name] [place], [place] [address] [phone] Conditions For Discharge Achievement of treatment goals and objective

## 2021-04-15 ENCOUNTER — Telehealth: Payer: Medicare Other | Admitting: Nurse Practitioner

## 2021-04-15 DIAGNOSIS — J069 Acute upper respiratory infection, unspecified: Secondary | ICD-10-CM

## 2021-04-15 MED ORDER — AZELASTINE HCL 0.1 % NA SOLN: 1.0000 | Freq: Two times a day (BID) | NASAL | 0 refills | Status: DC

## 2021-04-15 MED ORDER — PREDNISONE 10 MG PO TABS: 10.0000 mg | ORAL_TABLET | Freq: Every day | ORAL | 0 refills | Status: DC

## 2021-04-15 NOTE — Progress Notes (Signed)
I have spent 5 minutes in review of e-visit questionnaire, review and updating patient chart, medical decision making and response to patient.  ° °Dugan Vanhoesen W Briggitte Boline, NP ° °  °

## 2021-04-15 NOTE — Progress Notes (Signed)

## 2021-05-07 ENCOUNTER — Other Ambulatory Visit: Payer: Self-pay | Admitting: Nurse Practitioner

## 2021-05-07 DIAGNOSIS — J069 Acute upper respiratory infection, unspecified: Secondary | ICD-10-CM

## 2021-06-22 ENCOUNTER — Other Ambulatory Visit: Payer: Self-pay

## 2021-06-22 ENCOUNTER — Ambulatory Visit (INDEPENDENT_AMBULATORY_CARE_PROVIDER_SITE_OTHER): Payer: Medicare Other | Admitting: Clinical

## 2021-06-22 DIAGNOSIS — F331 Major depressive disorder, recurrent, moderate: Secondary | ICD-10-CM

## 2021-06-22 NOTE — Progress Notes (Signed)
Diagnosis: F33.2, Major Depression, Recurrent, Moderate Time of session: 10:02am-10:56am CPT Code: [phone] (in person)  Gail Fuentes was seen in person for individual therapy. She reported continued improvement in mood, despite several stressful situations involving family members. Therapist provided an opportunity to process these experiences, offering validation and support. For homework, she will continue to intentionally do things that make her feel good, and therapist encouraged her to notice when things she worries about don't happen, and remind herself of when things she is worried about are not happening directly to her. She is scheduled to be seen again in one week.  [name], Gail Fuentes  Treatment Plan Client Abilities/Strengths  Gail Fuentes presented as motivated to engage in therapy and able to describe her challenges.  Client Treatment Preferences  Gail Fuentes prefers in-person appointments  Client Statement of Needs  Gail Fuentes is seeking cognitive behavioral therapy to manage symptoms of depression and anxiety.  Treatment Level  Biweekly  Symptoms  Anxiety: Excessive worry, sleep irregularity, difficulty relaxing (Status: maintained). Depression:  Depressed mood, lack of motivation, withdrawal from previously enjoyed activities, hopelessness  (Status: maintained).  Problems Addressed  New Description, New Description  Goals 1. Gail Fuentes has a history of recurrent depression Objective Gail Fuentes will increase self esteem and self compassion Target Date: 2022-01-16 Frequency: Daily Progress: 0 Modality: individual Objective Gail Fuentes will reduce anxious planning behavior and accept her personal limitations Target Date: 2022-01-16 Frequency: Biweekly Signed by: Gail Mom, Gail Fuentes Version 2 Signed on: 2021-03-16 11:22:00 -0500 Generated on: 2022-12-07T15:59:08Z [name] DOB: 1945-11-01 Provider: Charlyne Mom, Gail Fuentes Created on: 2021-03-16 11:10:24 -0500 [facility] [name] [place], [place] [address] [phone] Progress: 0 Modality: individual Objective Gail Fuentes will improve her overall mood and sense of well-being Target Date: 2022-01-16 Frequency: Biweekly Progress: 0 Modality: individual Related Interventions 1. Therapist will provide Gail Fuentes with opportunities to process her experiences in session 2. Therapist will help Gail Fuentes to identify and disengage from maladaptive thought and behavior  patterns using CBT based strategies 3. Therapist will engage Gail Fuentes in behavior activation, including incorporation of structure,  pleasant events, and mastery events into her routine 4. Therapist will provide referrals for additional resources as appropriate  2. Gail Fuentes has difficulty setting boundaries in relationships and  prioritizing self-care Objective Gail Fuentes will develop strategies to support healthy communication and boundary setting in  relationships Target Date: 2022-01-16 Frequency: Biweekly Progress: 0 Modality: individual Related Interventions 1. Therapist will provide Gail Fuentes with communication strategies and opportunities to practice,  including writing down her thoughts, in-session processing of interactions, and in-vivo role  plays Diagnosis Axis  none 300.02 (Generalized anxiety disorder) - Open - [Signifier: n/a]  Axis  none 296.32 (Major depressive affective disorder, recurrent episode, moderate) - Open -  [Signifier: n/a]  Signed by: Gail Mom, Gail Fuentes Version 2 Signed on: 2021-03-16 11:22:00 -0500 Generated on: 2022-12-07T15:59:08Z [name] DOB: 07/17/1945 Provider: Charlyne Mom, Gail Fuentes Created on: 2021-03-16 11:10:24 -0500 [facility] [name] [place], [place] [address] [phone] Conditions For Discharge Achievement of treatment goals and objective               [name], Gail Fuentes

## 2021-06-29 ENCOUNTER — Other Ambulatory Visit: Payer: Self-pay

## 2021-06-29 ENCOUNTER — Ambulatory Visit (INDEPENDENT_AMBULATORY_CARE_PROVIDER_SITE_OTHER): Payer: Medicare Other | Admitting: Clinical

## 2021-06-29 DIAGNOSIS — F331 Major depressive disorder, recurrent, moderate: Secondary | ICD-10-CM

## 2021-06-29 NOTE — Progress Notes (Signed)
Diagnosis: F33.2, Major Depression, Recurrent, Moderate Time of session: 11:03am-11:59am CPT Code: [phone] (in person)  Gail Fuentes was seen in person for individual therapy. Session focused on processing how dynamics in her family of origin currently continue to impact her emotional functioning and tendency to try to fix situations for other people. She shared several examples of being assertive toward others and allowing herself to acknowledge and prioritize her own emotional needs at times. She is scheduled to be seen again in one month.    [name], PhD  Treatment Plan Client Abilities/Strengths  Gail Fuentes presented as motivated to engage in therapy and able to describe her challenges.  Client Treatment Preferences  Gail Fuentes prefers in-person appointments  Client Statement of Needs  Gail Fuentes is seeking cognitive behavioral therapy to manage symptoms of depression and anxiety.  Treatment Level  Biweekly  Symptoms  Anxiety: Excessive worry, sleep irregularity, difficulty relaxing (Status: maintained). Depression:  Depressed mood, lack of motivation, withdrawal from previously enjoyed activities, hopelessness  (Status: maintained).  Problems Addressed  New Description, New Description  Goals 1. Gail Fuentes has a history of recurrent depression Objective Gail Fuentes will increase self esteem and self compassion Target Date: 2022-01-16 Frequency: Daily Progress: 0 Modality: individual Objective Gail Fuentes will reduce anxious planning behavior and accept her personal limitations Target Date: 2022-01-16 Frequency: Biweekly Signed by: Gail Mom, PhD Version 2 Signed on: 2021-03-16 11:22:00 -0500 Generated on: 2022-12-07T15:59:08Z [name] DOB: May 11, 1945 Provider: Charlyne Mom, PhD Created on: 2021-03-16 11:10:24 -0500 [facility] [name] [place], [place] [address] [phone] Progress: 0 Modality: individual Objective Gail Fuentes  will improve her overall mood and sense of well-being Target Date: 2022-01-16 Frequency: Biweekly Progress: 0 Modality: individual Related Interventions 1. Therapist will provide [name] with opportunities to process her experiences in session 2. Therapist will help [name] to identify and disengage from maladaptive thought and behavior  patterns using CBT based strategies 3. Therapist will engage [name] in behavior activation, including incorporation of structure,  pleasant events, and mastery events into her routine 4. Therapist will provide referrals for additional resources as appropriate  2. [name] has difficulty setting boundaries in relationships and  prioritizing self-care Objective [name] will develop strategies to support healthy communication and boundary setting in  relationships Target Date: 2022-01-16 Frequency: Biweekly Progress: 0 Modality: individual Related Interventions 1. Therapist will provide [name] with communication strategies and opportunities to practice,  including writing down her thoughts, in-session processing of interactions, and in-vivo role  plays Diagnosis Axis  none 300.02 (Generalized anxiety disorder) - Open - [Signifier: n/a]  Axis  none 296.32 (Major depressive affective disorder, recurrent episode, moderate) - Open -  [Signifier: n/a]  Signed by: Gail Mom, PhD Version 2 Signed on: 2021-03-16 11:22:00 -0500 Generated on: 2022-12-07T15:59:08Z [name] DOB: 1945/10/29 Provider: Charlyne Mom, PhD Created on: 2021-03-16 11:10:24 -0500 [facility] [name] [place], [place] [address] [phone] Conditions For Discharge Achievement of treatment goals and objective               [name], PhD               [name], PhD

## 2021-07-18 ENCOUNTER — Other Ambulatory Visit: Payer: Self-pay

## 2021-07-18 ENCOUNTER — Ambulatory Visit (INDEPENDENT_AMBULATORY_CARE_PROVIDER_SITE_OTHER): Payer: Medicare Other | Admitting: Clinical

## 2021-07-18 DIAGNOSIS — F331 Major depressive disorder, recurrent, moderate: Secondary | ICD-10-CM

## 2021-07-18 NOTE — Progress Notes (Signed)
Diagnosis: F33.2, Major Depression, Recurrent, Moderate ?Time of session: 10:03am-11:00am ?CPT Code: [phone] (in person) ? ?Gail Fuentes was seen in person for individual therapy. Session focused on ongoing concerns for her husband's health, as well as her own desire for more frequent pleasurable experiences in her day to day life. Therapist discussed pleasant events scheduling and worked with her to consider how to incorporate more frequent pleasant experiences. Therapist also worked with her to consider how to increase social support in navigating her husband's health, such as by talking to their son about it. She is scheduled to be seen again in two weeks. ? ?Gail Cruise, PhD ? ?Treatment Plan ?Client Abilities/Strengths  ?Gail Fuentes presented as motivated to engage in therapy and able to describe her challenges.  ?Client Treatment Preferences  ?Gail Fuentes prefers in-person appointments  ?Client Statement of Needs  ?Gail Fuentes is seeking cognitive behavioral therapy to manage symptoms of depression and anxiety.  ?Treatment Level  ?Biweekly  ?Symptoms  ?Anxiety: Excessive worry, sleep irregularity, difficulty relaxing (Status: maintained). Depression:  ?Depressed mood, lack of motivation, withdrawal from previously enjoyed activities, hopelessness  ?(Status: maintained).  ?Problems Addressed  ?New Description, New Description  ?Goals ?1. Gail Fuentes has a history of recurrent depression ?Objective ?Thy will increase self esteem and self compassion ?Target Date: 2022-01-16 Frequency: Daily ?Progress: 0 Modality: individual ?Objective ?Gail Fuentes ?Target Date: 2022-01-16 Frequency: Biweekly ?Signed by: Laroy Apple, PhD Version 2 Signed on: 2021-03-16 11:22:00 -0500 ?Generated on: 2022-12-07T15:59:08Z ?[name] ?DOB: [birth year] ?Provider: Laroy Apple, PhD ?Created on: 2021-03-16 11:10:24 -0500 ?[facility] ?[address] ?[place], [place] [address] ?[phone] ?Progress: 0 Modality: individual ?Objective ?Gail Fuentes will improve her overall mood and sense of well-being ?Target Date: 2022-01-16 Frequency: Biweekly ?Progress: 0 Modality: individual ?Related Interventions ?1. Therapist will provide Gail Fuentes with opportunities to process her experiences in session ?2. Therapist will help Gail Fuentes to identify and disengage from maladaptive thought and behavior  ?patterns using CBT based strategies ?3. Therapist will engage Gail Fuentes in behavior activation, including incorporation of structure,  ?pleasant events, and mastery events into her routine ?4. Therapist will provide referrals for additional resources as appropriate  ?2. Gail Fuentes has difficulty setting boundaries in relationships and  ?prioritizing self-care ?Objective ?Gail Fuentes will develop strategies to support healthy communication and boundary setting in  ?relationships ?Target Date: 2022-01-16 Frequency: Biweekly ?Progress: 0 Modality: individual ?Related Interventions ?1. Therapist will provide Gail Fuentes with communication strategies and opportunities to practice,  ?including writing down her thoughts, in-session processing of interactions, and in-vivo role  ?plays ?Diagnosis ?Axis  ?none ?300.02 (Generalized anxiety disorder) - Open - [Signifier: n/a]  ?Axis  ?none ?296.32 (Major depressive affective disorder, recurrent episode, moderate) - Open -  ?[Signifier: n/a]  ?Signed by: Laroy Apple, PhD Version 2 Signed on: 2021-03-16 11:22:00 -0500 ?Generated on: 2022-12-07T15:59:08Z ?[name] ?DOB: 1945/12/03 ?Provider: Laroy Apple, PhD ?Created on: 2021-03-16 11:10:24 -0500 ?[facility] ?[address] ?[place], [place] [address] ?[phone] ?Conditions For Discharge ?Achievement of treatment goals and objective ? ? ? ? ? ? ? ? ? ? ? ? ? ? ?Gail Cruise, PhD ? ? ? ? ? ? ? ? ? ? ? ? ? ? ?Gail Cruise,  PhD ? ? ? ? ? ? ? ? ? ? ? ? ? ? ?Gail Cruise, PhD ?

## 2021-08-01 ENCOUNTER — Ambulatory Visit (INDEPENDENT_AMBULATORY_CARE_PROVIDER_SITE_OTHER): Payer: Medicare Other | Admitting: Clinical

## 2021-08-01 ENCOUNTER — Other Ambulatory Visit: Payer: Self-pay

## 2021-08-01 DIAGNOSIS — F331 Major depressive disorder, recurrent, moderate: Secondary | ICD-10-CM

## 2021-08-01 NOTE — Progress Notes (Signed)
Diagnosis: F33.2, Major Depression, Recurrent, Moderate ?Time of session: 10:03am-11:00am ?CPT Code: [phone] (in person) ? ?Brylan was seen in person for individual therapy. She shared a feeling of physical and emotional fatigue, including loss of motivation, loss of enjoyment in day-to-day activities, and feelings of hopelessness. Therapist provided an opportunity to process these feelings, sharing that it sounded like symptoms of a depressive episode. Therapist shared psychoeducation on behavioral activation and engaged Ariadna in a brief guided meditation. She indicated that meditation has been helpful for her in the past, and that she will re-incorporate it into her routine prior to her next session. She is scheduled to be seen again in three weeks. ? ? ? ?[name], PhD ? ?Treatment Plan ?Client Abilities/Strengths  ?Mashawn presented as motivated to engage in therapy and able to describe her challenges.  ?Client Treatment Preferences  ?Yesena prefers in-person appointments  ?Client Statement of Needs  ?Karlena is seeking cognitive behavioral therapy to manage symptoms of depression and anxiety.  ?Treatment Level  ?Biweekly  ?Symptoms  ?Anxiety: Excessive worry, sleep irregularity, difficulty relaxing (Status: maintained). Depression:  ?Depressed mood, lack of motivation, withdrawal from previously enjoyed activities, hopelessness  ?(Status: maintained).  ?Problems Addressed  ?New Description, New Description  ?Goals ?1. Shabreka has a history of recurrent depression ?Objective ?Poonam will increase self esteem and self compassion ?Target Date: 2022-01-16 Frequency: Daily ?Progress: 0 Modality: individual ?Objective ?Rosary will reduce anxious planning behavior and accept her personal limitations ?Target Date: 2022-01-16 Frequency: Biweekly ?Signed by: Charlyne Mom, PhD Version 2 Signed on: 2021-03-16 11:22:00 -0500 ?Generated on: 2022-12-07T15:59:08Z ?Client: [name] Edmonia Caprio) Eddie Candle ?DOB:  [birth year] ?Provider: Charlyne Mom, PhD ?Created on: 2021-03-16 11:10:24 -0500 ?[facility] ?[name] ?[place], [place] [address] ?[phone] ?Progress: 0 Modality: individual ?Objective ?Ameera will improve her overall mood and sense of well-being ?Target Date: 2022-01-16 Frequency: Biweekly ?Progress: 0 Modality: individual ?Related Interventions ?1. Therapist will provide Jazman with opportunities to process her experiences in session ?2. Therapist will help Sevin to identify and disengage from maladaptive thought and behavior  ?patterns using CBT based strategies ?3. Therapist will engage Nayleen in behavior activation, including incorporation of structure,  ?pleasant events, and mastery events into her routine ?4. Therapist will provide referrals for additional resources as appropriate  ?2. Trenace has difficulty setting boundaries in relationships and  ?prioritizing self-care ?Objective ?Declynn will develop strategies to support healthy communication and boundary setting in  ?relationships ?Target Date: 2022-01-16 Frequency: Biweekly ?Progress: 0 Modality: individual ?Related Interventions ?1. Therapist will provide Caliope with communication strategies and opportunities to practice,  ?including writing down her thoughts, in-session processing of interactions, and in-vivo role  ?plays ?Diagnosis ?Axis  ?none ?300.02 (Generalized anxiety disorder) - Open - [Signifier: n/a]  ?Axis  ?none ?296.32 (Major depressive affective disorder, recurrent episode, moderate) - Open -  ?[Signifier: n/a]  ?Signed by: Charlyne Mom, PhD Version 2 Signed on: 2021-03-16 11:22:00 -0500 ?Generated on: 2022-12-07T15:59:08Z ?Client: [name] Edmonia Caprio) Eddie Candle ?DOB: [birth year] ?Provider: Charlyne Mom, PhD ?Created on: 2021-03-16 11:10:24 -0500 ?[facility] ?[name] ?[place], [place] [address] ?[phone] ?Conditions For Discharge ?Achievement of treatment goals and  objective ? ? ? ? ? ? ? ? ? ? ? ? ? ? ?[name], PhD ? ? ? ? ? ? ? ? ? ? ? ? ? ? ?[name], PhD ? ? ? ? ? ? ? ? ? ? ? ? ? ? ?[name], PhD ? ? ? ? ? ? ? ? ? ? ? ? ? ? ?  [name], PhD ?

## 2021-08-03 NOTE — Progress Notes (Signed)
? ? ?Chief Complaint  ?Patient presents with  ? Follow-up  ?  Hypertensive heart disease  ? ?History of Present Illness: 76 yo female with history of HTN, HLD, GERD, anxiety, depression and hyperparathyroidism who is here today for cardiac follow up. She was followed remotely in our office by Dr. Verl Blalock for an abnormal EKG. Echo 2011 with normal LV function, no valve issues. I met her in January 2018. She reported occasional dyspnea. Her EKG showed poor R wave progression in the precordial leads and possible inferior Q waves, all unchanged from 2009. Echo February 2018 with normal LV systolic function, grade 1 diastolic dysfunction, mild LVH, no valve disease.  ? ?She is here today for follow up. The patient denies any chest pain, dyspnea, palpitations, lower extremity edema, orthopnea, PND. Occasional dizziness but no near syncope or syncope.  ? ?Primary Care Physician: Darreld Mclean, MD ? ?Past Medical History:  ?Diagnosis Date  ? Abnormal EKG   ? per patient- states evaluated by Dr Verl Blalock in the past  ? Allergy   ? sneezing  late Fall  ? Anemia 01/23/12  ? H/H 11.7/35  ? Anxiety state, unspecified   ? Arthritis   ? Benign paroxysmal positional vertigo   ? Depression   ? Depressive disorder, not elsewhere classified   ? Diverticulosis   ? Esophageal reflux   ? Esophageal stricture   ? Gallstones   ? Heart murmur   ? Hiatal hernia   ? History of hyperparathyroidism   ? History of UTI   ? HTN (hypertension)   ? Hyperlipidemia   ? OSA (obstructive sleep apnea) 05/27/2018  ? Other abnormal glucose   ? Personal history of other diseases of digestive system   ? 12-15 since 1990, no recurrence 2006  ? PONV (postoperative nausea and vomiting)   ? Unspecified hypothyroidism   ? Vitamin D deficiency   ? ? ?Past Surgical History:  ?Procedure Laterality Date  ? CATARACT EXTRACTION, BILATERAL Bilateral 12/06/2019  ? [place]  ? CESAREAN SECTION  05/08/1975  ? CHOLECYSTECTOMY  05/07/2002  ? COLONOSCOPY  12/06/2003  ? Tics  ?  DENTAL SURGERY  1962, 2012  ? Age 84-Abscessed Teeth Extracted   ? PARATHYROIDECTOMY Left 02/13/2013  ? Procedure: LEFT INFERIOR PARATHYROIDECTOMY with frozen section ;  Surgeon: Earnstine Regal, MD;  Location: [facility];  Service: General;  Laterality: Left;  ? TOTAL ABDOMINAL HYSTERECTOMY W/ BILATERAL SALPINGOOPHORECTOMY  05/08/2003  ? Ovarian Cyst  ? ? ?Current Outpatient Medications  ?Medication Sig Dispense Refill  ? ALPRAZolam (XANAX) 0.5 MG tablet Take 0.5-1 tablets (0.25-0.5 mg total) by mouth 2 (two) times daily as needed for anxiety. 30 tablet 0  ? azelastine (ASTELIN) 0.1 % nasal spray Place 1 spray into both nostrils 2 (two) times daily. Use in each nostril as directed 30 mL 0  ? Cholecalciferol (VITAMIN D3) 2000 UNITS TABS Take 2 tablets by mouth daily.     ? cyanocobalamin 100 MCG tablet Take 100 mcg by mouth daily.    ? FLUoxetine (PROZAC) 40 MG capsule Take 1 capsule (40 mg total) by mouth daily. 90 capsule 3  ? fluticasone (FLONASE) 50 MCG/ACT nasal spray Place 1 spray into both nostrils daily. 16 g 2  ? levothyroxine (SYNTHROID) 100 MCG tablet TAKE 1 TABLET BY MOUTH EVERY DAY AND 1/2 TABLET ON SUNDAY 90 tablet 3  ? losartan (COZAAR) 50 MG tablet Take 1 tablet (50 mg total) by mouth daily. 90 tablet 3  ? pantoprazole (PROTONIX)  40 MG tablet Take 1 tablet (40 mg total) by mouth daily. 90 tablet 3  ? ?No current facility-administered medications for this visit.  ? ? ?Allergies  ?Allergen Reactions  ? Augmentin [Amoxicillin-Pot Clavulanate] Rash  ? ? ?Social History  ? ?Socioeconomic History  ? Marital status: Married  ?  Spouse name: Not on file  ? Number of children: 1  ? Years of education: Not on file  ? Highest education level: Not on file  ?Occupational History  ? Occupation: Administrator, [employer] Service and homes  ?  Employer: RETIRED  ? Occupation: retired Air cabin crew  ?Tobacco Use  ? Smoking status: Former  ?  Types: Cigarettes  ?  Quit date: 05/07/1973  ?  Years since quitting: 48.2  ? Smokeless tobacco:  Never  ? Tobacco comments:  ?  Smoked 1968-1975 up to 2 ppd  ?Vaping Use  ? Vaping Use: Never used  ?Substance and Sexual Activity  ? Alcohol use: Yes  ?  Alcohol/week: 14.0 standard drinks  ?  Types: 14 Standard drinks or equivalent per week  ?  Comment: 2 nightly wines  ? Drug use: No  ? Sexual activity: Not Currently  ?Other Topics Concern  ? Not on file  ?Social History Narrative  ? Regular exercise: no  ? Caffeine use: daily  ? ?Social Determinants of Health  ? ?Financial Resource Strain: Low Risk   ? Difficulty of Paying Living Expenses: Not hard at all  ?Food Insecurity: No Food Insecurity  ? Worried About Charity fundraiser in the Last Year: Never true  ? Ran Out of Food in the Last Year: Never true  ?Transportation Needs: No Transportation Needs  ? Lack of Transportation (Medical): No  ? Lack of Transportation (Non-Medical): No  ?Physical Activity: Insufficiently Active  ? Days of Exercise per Week: 3 days  ? Minutes of Exercise per Session: 30 min  ?Stress: Stress Concern Present  ? Feeling of Stress : To some extent  ?Social Connections: Moderately Integrated  ? Frequency of Communication with Friends and Family: More than three times a week  ? Frequency of Social Gatherings with Friends and Family: More than three times a week  ? Attends Religious Services: Never  ? Active Member of Clubs or Organizations: Yes  ? Attends Archivist Meetings: 1 to 4 times per year  ? Marital Status: Married  ?Intimate Partner Violence: Not At Risk  ? Fear of Current or Ex-Partner: No  ? Emotionally Abused: No  ? Physically Abused: No  ? Sexually Abused: No  ? ? ?Family History  ?Problem Relation Age of Onset  ? Coronary artery disease Father   ? Heart attack Father 55  ? COPD Father   ? Diabetes Mother   ? Dysrhythmia Mother   ? Depression Mother   ? COPD Mother   ? Colon polyps Mother   ? Kidney disease Mother   ? Prostate cancer Brother   ? Other Brother   ?     Wegener's in sinus; [facility]  ? Heart disease  Brother   ? Arthritis Brother   ? Hypertension Brother   ? Prostate cancer Brother   ? Cancer Brother   ?     Soft tissue in the face/temple  ? Stroke Maternal Aunt   ?     >65  ? Stroke Maternal Grandmother   ?     >65  ? Stroke Maternal Grandfather 22  ? ? ?Review of Systems:  As stated  in the HPI and otherwise negative.  ? ?BP 106/70   Pulse (!) 59   Ht 5' 5"  (1.651 m)   Wt 194 lb (88 kg)   SpO2 99%   BMI 32.28 kg/m?  ? ?Physical Examination: ?General: Well developed, well nourished, NAD  ?HEENT: OP clear, mucus membranes moist  ?SKIN: warm, dry. No rashes. ?Neuro: No focal deficits  ?Musculoskeletal: Muscle strength 5/5 all ext  ?Psychiatric: Mood and affect normal  ?Neck: No JVD, no carotid bruits, no thyromegaly, no lymphadenopathy.  ?Lungs:Clear bilaterally, no wheezes, rhonci, crackles ?Cardiovascular: Regular rate and rhythm. No murmurs, gallops or rubs. ?Abdomen:Soft. Bowel sounds present. Non-tender.  ?Extremities: No lower extremity edema. Pulses are 2 + in the bilateral DP/PT. ? ?Echo February 2018:  ?Left ventricle: The cavity size was normal. Wall thickness was ?  increased in a pattern of mild LVH. Systolic function was normal. ?  The estimated ejection fraction was in the range of 60% to 65%. ?  Wall motion was normal; there were no regional wall motion ?  abnormalities. Doppler parameters are consistent with abnormal ?  left ventricular relaxation (grade 1 diastolic dysfunction). ?  Doppler parameters are consistent with high ventricular filling ?  pressure. ?  ?Impressions: ?  ?- Normal LV systolic function; grade 1 diastolic dysfunction with ?  elevated LV filling pressure; mild LVH. ? ?EKG:  EKG is ordered today. ?The ekg ordered today demonstrates Sinus rate 59 bpm ? ?Recent Labs: ?01/03/2021: TSH 0.45 ?01/25/2021: ALT 22; BUN 7; Creatinine, Ser 0.84; Hemoglobin 13.8; Platelets 281.0; Potassium 4.5; Sodium 137  ? ?Lipid Panel ?   ?Component Value Date/Time  ? CHOL 243 (H) 01/25/2021 1015  ?  CHOL 236 (H) 10/12/2014 1046  ? TRIG 87.0 01/25/2021 1015  ? TRIG 109 10/12/2014 1046  ? TRIG 91 05/13/2006 1031  ? HDL 78.90 01/25/2021 1015  ? HDL 90 10/12/2014 1046  ? CHOLHDL 3 01/25/2021 1015  ? VLDL 17.4 01/26/19

## 2021-08-04 ENCOUNTER — Ambulatory Visit (INDEPENDENT_AMBULATORY_CARE_PROVIDER_SITE_OTHER): Payer: Medicare Other | Admitting: Cardiovascular Disease

## 2021-08-04 ENCOUNTER — Encounter: Payer: Self-pay | Admitting: Cardiovascular Disease

## 2021-08-04 VITALS — BP 106/70 | HR 59 | Ht 65.0 in | Wt 194.0 lb

## 2021-08-04 DIAGNOSIS — R42 Dizziness and giddiness: Secondary | ICD-10-CM | POA: Diagnosis not present

## 2021-08-04 DIAGNOSIS — I119 Hypertensive heart disease without heart failure: Secondary | ICD-10-CM

## 2021-08-04 NOTE — Patient Instructions (Signed)
Medication Instructions:  ?No changes ?*If you need a refill on your cardiac medications before your next appointment, please call your pharmacy* ? ? ?Lab Work: ?none ?If you have labs (blood work) drawn today and your tests are completely normal, you will receive your results only by: ?MyChart Message (if you have MyChart) OR ?A paper copy in the mail ?If you have any lab test that is abnormal or we need to change your treatment, we will call you to review the results. ? ? ?Testing/Procedures: ?Your physician has requested that you have an echocardiogram. Echocardiography is a painless test that uses sound waves to create images of your heart. It provides your doctor with information about the size and shape of your heart and how well your heart?s chambers and valves are working. This procedure takes approximately one hour. There are no restrictions for this procedure. ? ? ?Follow-Up: ?At [facility], you and your health needs are our priority.  As part of our continuing mission to provide you with exceptional heart care, we have created designated Provider Care Teams.  These Care Teams include your primary Cardiologist (physician) and Advanced Practice Providers (APPs -  Physician Assistants and Nurse Practitioners) who all work together to provide you with the care you need, when you need it. ? ?Your next appointment:   ?12 month(s) ? ?The format for your next appointment:   ?In Person ? ?Provider:   ?Christopher McAlhany, MD   ? ?  ?

## 2021-08-17 ENCOUNTER — Other Ambulatory Visit: Payer: Self-pay | Admitting: Family Medicine

## 2021-08-17 DIAGNOSIS — F339 Major depressive disorder, recurrent, unspecified: Secondary | ICD-10-CM

## 2021-08-22 ENCOUNTER — Ambulatory Visit (HOSPITAL_COMMUNITY): Payer: Medicare Other | Attending: Cardiology

## 2021-08-22 DIAGNOSIS — I119 Hypertensive heart disease without heart failure: Secondary | ICD-10-CM | POA: Diagnosis present

## 2021-08-22 DIAGNOSIS — R42 Dizziness and giddiness: Secondary | ICD-10-CM | POA: Insufficient documentation

## 2021-08-22 LAB — ECHOCARDIOGRAM COMPLETE
Area-P 1/2: 2.99 cm2
S' Lateral: 3.3 cm

## 2021-08-23 ENCOUNTER — Telehealth: Payer: Self-pay | Admitting: Family Medicine

## 2021-08-23 DIAGNOSIS — F339 Major depressive disorder, recurrent, unspecified: Secondary | ICD-10-CM

## 2021-08-23 MED ORDER — ALPRAZOLAM 0.5 MG PO TABS: ORAL_TABLET | ORAL | 0 refills | Status: DC

## 2021-08-23 NOTE — Addendum Note (Signed)
Addended by: Abbe Amsterdam C on: 08/23/2021 04:08 PM ? ? Modules accepted: Orders ? ?

## 2021-08-24 ENCOUNTER — Encounter: Payer: Self-pay | Admitting: Cardiovascular Disease

## 2021-08-24 ENCOUNTER — Other Ambulatory Visit: Payer: Self-pay

## 2021-08-24 ENCOUNTER — Telehealth: Payer: Self-pay | Admitting: Family Medicine

## 2021-08-24 DIAGNOSIS — F339 Major depressive disorder, recurrent, unspecified: Secondary | ICD-10-CM

## 2021-08-24 MED ORDER — FLUOXETINE HCL 40 MG PO CAPS: 40.0000 mg | ORAL_CAPSULE | Freq: Every day | ORAL | 3 refills | Status: DC

## 2021-08-24 NOTE — Telephone Encounter (Signed)
Refill sent.

## 2021-08-24 NOTE — Telephone Encounter (Signed)
Medication: fluoxetine 40 MG  ? ?  ? ?Has the patient contacted their pharmacy? Yes.   ?(If no, request that the patient contact the pharmacy for the refill.) ?(If yes, when and what did the pharmacy advise?) ? ?  ? ?Preferred Pharmacy (with phone number or street name):  ?[facility]  ?[address] [place] [address] ?[phone] ?  ? ? ? ?

## 2021-08-25 ENCOUNTER — Ambulatory Visit (INDEPENDENT_AMBULATORY_CARE_PROVIDER_SITE_OTHER): Payer: Medicare Other | Admitting: Clinical

## 2021-08-25 DIAGNOSIS — F331 Major depressive disorder, recurrent, moderate: Secondary | ICD-10-CM

## 2021-08-25 NOTE — Progress Notes (Signed)
Diagnosis: F33.2, Major Depression, Recurrent, Moderate ?Time of session: 3:05 pm-04:05 apm ?CPT Code: [id number] (in person) ? ?Jacquise was seen in person for individual therapy. She shared that she continues to have "good days and bad days." Session focused on her vision of what the next phase of her life might be like. She shared a sense of pessimism based on what she had witnessed to be her parents' experiences. Therapist encouraged her to consider a more positive vision for her future, such as by identifying an "old age role model," and considering what she would like this next stage to be about. She is scheduled to be seen again in two weeks. ? ? ?[name], PhD ? ?Treatment Plan ?Client Abilities/Strengths  ?Andalasia presented as motivated to engage in therapy and able to describe her challenges.  ?Client Treatment Preferences  ?Leeta prefers in-person appointments  ?Client Statement of Needs  ?Masiela is seeking cognitive behavioral therapy to manage symptoms of depression and anxiety.  ?Treatment Level  ?Biweekly  ?Symptoms  ?Anxiety: Excessive worry, sleep irregularity, difficulty relaxing (Status: maintained). Depression:  ?Depressed mood, lack of motivation, withdrawal from previously enjoyed activities, hopelessness  ?(Status: maintained).  ?Problems Addressed  ?New Description, New Description  ?Goals ?1. Saja has a history of recurrent depression ?Objective ?Adyline will increase self esteem and self compassion ?Target Date: 2022-01-16 Frequency: Daily ?Progress: 0 Modality: individual ?Objective ?Prestyn will reduce anxious planning behavior and accept her personal limitations ?Target Date: 2022-01-16 Frequency: Biweekly ?Signed by: Laroy Apple, PhD Version 2 Signed on: 2021-03-16 11:22:00 -0500 ?Generated on: 2022-12-07T15:59:08Z ?[name]) Gail Fuentes ?DOB: 09/23/1945 ?Provider: Laroy Apple, PhD ?Created on: 2021-03-16 11:10:24 -0500 ?[facility] ?[address] ?[place], [place] [address] ?[phone] ?Progress: 0 Modality: individual ?Objective ?Twylah will improve her overall mood and sense of well-being ?Target Date: 2022-01-16 Frequency: Biweekly ?Progress: 0 Modality: individual ?Related Interventions ?1. Therapist will provide Maudell with opportunities to process her experiences in session ?2. Therapist will help Deana to identify and disengage from maladaptive thought and behavior  ?patterns using CBT based strategies ?3. Therapist will engage Carena in behavior activation, including incorporation of structure,  ?pleasant events, and mastery events into her routine ?4. Therapist will provide referrals for additional resources as appropriate  ?2. Diamonds has difficulty setting boundaries in relationships and  ?prioritizing self-care ?Objective ?Ecrin will develop strategies to support healthy communication and boundary setting in  ?relationships ?Target Date: 2022-01-16 Frequency: Biweekly ?Progress: 0 Modality: individual ?Related Interventions ?1. Therapist will provide Eriah with communication strategies and opportunities to practice,  ?including writing down her thoughts, in-session processing of interactions, and in-vivo role  ?plays ?Diagnosis ?Axis  ?none ?300.02 (Generalized anxiety disorder) - Open - [Signifier: n/a]  ?Axis  ?none ?296.32 (Major depressive affective disorder, recurrent episode, moderate) - Open -  ?[Signifier: n/a]  ?Signed by: Laroy Apple, PhD Version 2 Signed on: 2021-03-16 11:22:00 -0500 ?Generated on: 2022-12-07T15:59:08Z ?[name]) Gail Fuentes ?DOB: 12/31/1945 ?Provider: Laroy Apple, PhD ?Created on: 2021-03-16 11:10:24 -0500 ?[facility] ?[address] ?[place],  [address] ?[phone] ?Conditions For Discharge ?Achievement of treatment goals and objective ? ? ? ? ? ? ? ? ? ? ? ? ? ? ?[name], PhD ? ? ? ? ? ? ? ? ? ? ? ? ? ? ?[name],  PhD ? ? ? ? ? ? ? ? ? ? ? ? ? ? ?[name], PhD ? ? ? ? ? ? ? ? ? ? ? ? ? ? ?  [name], PhD ? ? ? ? ? ? ? ? ? ? ? ? ? ? ?[name], PhD ?

## 2021-08-27 ENCOUNTER — Other Ambulatory Visit: Payer: Self-pay | Admitting: Family Medicine

## 2021-09-07 ENCOUNTER — Ambulatory Visit (INDEPENDENT_AMBULATORY_CARE_PROVIDER_SITE_OTHER): Payer: Medicare Other | Admitting: Clinical

## 2021-09-07 DIAGNOSIS — F331 Major depressive disorder, recurrent, moderate: Secondary | ICD-10-CM | POA: Diagnosis not present

## 2021-09-07 NOTE — Progress Notes (Signed)
Diagnosis: F33.2, Major Depression, Recurrent, Moderate ?Time of session: [id number] ?CPT Code: [phone] (in person) ? ?Madolyn was seen in person for individual therapy. She queried alternative forms of treatment, including life coaching and integrative medicine. Therapist suggested speaking with a psychiatrist to determine whether medication adjustments might be a good idea, as well as bringing up her depressive symptoms to the endocrinologist that manages her hypothyroidism. She expressed feelings of loneliness and boredom. For homework, she will find her copy of the Artist's Way and bring it to her next session. She will also get out of the house and be around people at least once each day. She is scheduled to be seen again in two weeks. ? ? ?[name], PhD ? ?Treatment Plan ?Client Abilities/Strengths  ?Evangelina presented as motivated to engage in therapy and able to describe her challenges.  ?Client Treatment Preferences  ?Lukesha prefers in-person appointments  ?Client Statement of Needs  ?Thera is seeking cognitive behavioral therapy to manage symptoms of depression and anxiety.  ?Treatment Level  ?Biweekly  ?Symptoms  ?Anxiety: Excessive worry, sleep irregularity, difficulty relaxing (Status: maintained). Depression:  ?Depressed mood, lack of motivation, withdrawal from previously enjoyed activities, hopelessness  ?(Status: maintained).  ?Problems Addressed  ?New Description, New Description  ?Goals ?1. Teriyah has a history of recurrent depression ?Objective ?Heleen will increase self esteem and self compassion ?Target Date: 2022-01-16 Frequency: Daily ?Progress: 0 Modality: individual ?Objective ?Alany will reduce anxious planning behavior and accept her personal limitations ?Target Date: 2022-01-16 Frequency: Biweekly ?Signed by: Laroy Apple, PhD Version 2 Signed on: 2021-03-16 11:22:00 -0500 ?Generated on: 2022-12-07T15:59:08Z ?Client: Shelda Jakes Velora Mediate) [name] ?DOB:  28-Nov-1945 ?Provider: Laroy Apple, PhD ?Created on: 2021-03-16 11:10:24 -0500 ?[facility] [place] ?[place] ?[place], [place] [address] ?[phone] ?Progress: 0 Modality: individual ?Objective ?Callee will improve her overall mood and sense of well-being ?Target Date: 2022-01-16 Frequency: Biweekly ?Progress: 0 Modality: individual ?Related Interventions ?1. Therapist will provide Brittanyann with opportunities to process her experiences in session ?2. Therapist will help Ikeisha to identify and disengage from maladaptive thought and behavior  ?patterns using CBT based strategies ?3. Therapist will engage Fey in behavior activation, including incorporation of structure,  ?pleasant events, and mastery events into her routine ?4. Therapist will provide referrals for additional resources as appropriate  ?2. Sonnie has difficulty setting boundaries in relationships and  ?prioritizing self-care ?Objective ?Zoii will develop strategies to support healthy communication and boundary setting in  ?relationships ?Target Date: 2022-01-16 Frequency: Biweekly ?Progress: 0 Modality: individual ?Related Interventions ?1. Therapist will provide Stanley with communication strategies and opportunities to practice,  ?including writing down her thoughts, in-session processing of interactions, and in-vivo role  ?plays ?Diagnosis ?Axis  ?none ?300.02 (Generalized anxiety disorder) - Open - [Signifier: n/a]  ?Axis  ?none ?296.32 (Major depressive affective disorder, recurrent episode, moderate) - Open -  ?[Signifier: n/a]  ?Signed by: Laroy Apple, PhD Version 2 Signed on: 2021-03-16 11:22:00 -0500 ?Generated on: 2022-12-07T15:59:08Z ?Client: Shelda Jakes Velora Mediate) [name] ?DOB: [birth year] ?Provider: Laroy Apple, PhD ?Created on: 2021-03-16 11:10:24 -0500 ?[facility] ?[place] [address] ?[place],  [address] ?[phone] ?Conditions For Discharge ?Achievement of treatment goals and  objective ? ? ? ? ? ? ? ? ? ? ? ? ? ? ?[name], PhD ? ? ? ? ? ? ? ? ? ? ? ? ? ? ?[name], PhD ? ? ? ? ? ? ? ? ? ? ? ? ? ? ?[name], PhD ? ? ? ? ? ? ? ? ? ? ? ? ? ? ?  [name], PhD ? ? ? ? ? ? ? ? ? ? ? ? ? ? ?[name], PhD ? ? ? ? ? ? ? ? ? ? ? ? ? ? ?[name], PhD ?

## 2021-09-18 ENCOUNTER — Ambulatory Visit (INDEPENDENT_AMBULATORY_CARE_PROVIDER_SITE_OTHER): Payer: Medicare Other | Admitting: Clinical

## 2021-09-18 DIAGNOSIS — F331 Major depressive disorder, recurrent, moderate: Secondary | ICD-10-CM

## 2021-09-18 NOTE — Progress Notes (Signed)
Diagnosis: F33.2, Major Depression, Recurrent, Moderate ?Time of session: 11:05am-11:59am ?CPT Code: [phone] (in person) ? ?Allannah was seen in person for individual therapy. She had purchased [employer] workbook, and brought it to session. Session focused on processing her mother's day, which she described as good. She described ongoing concerns for her husband's health, and therapist suggested a conversation with her son to create a plan to support her husband in speaking to his PCP. She is scheduled to be seen again in two weeks. ? ? ? ?[name], PhD ? ?Treatment Plan ?Client Abilities/Strengths  ?Meredith presented as motivated to engage in therapy and able to describe her challenges.  ?Client Treatment Preferences  ?Dahiana prefers in-person appointments  ?Client Statement of Needs  ?Tessie is seeking cognitive behavioral therapy to manage symptoms of depression and anxiety.  ?Treatment Level  ?Biweekly  ?Symptoms  ?Anxiety: Excessive worry, sleep irregularity, difficulty relaxing (Status: maintained). Depression:  ?Depressed mood, lack of motivation, withdrawal from previously enjoyed activities, hopelessness  ?(Status: maintained).  ?Problems Addressed  ?New Description, New Description  ?Goals ?1. Evian has a history of recurrent depression ?Objective ?Evea will increase self esteem and self compassion ?Target Date: 2022-01-16 Frequency: Daily ?Progress: 0 Modality: individual ?Objective ?Suesan will reduce anxious planning behavior and accept her personal limitations ?Target Date: 2022-01-16 Frequency: Biweekly ?Signed by: Charlyne Mom, PhD Version 2 Signed on: 2021-03-16 11:22:00 -0500 ?Generated on: 2022-12-07T15:59:08Z ?Client: [name] Edmonia Caprio) Eddie Candle ?DOB: December 27, 1945 ?Provider: Charlyne Mom, PhD ?Created on: 2021-03-16 11:10:24 -0500 ?[facility] ?[name] ?[place], [place] [address] ?[phone] ?Progress: 0 Modality: individual ?Objective ?Gisela  will improve her overall mood and sense of well-being ?Target Date: 2022-01-16 Frequency: Biweekly ?Progress: 0 Modality: individual ?Related Interventions ?1. Therapist will provide Jazzmin with opportunities to process her experiences in session ?2. Therapist will help Nikeia to identify and disengage from maladaptive thought and behavior  ?patterns using CBT based strategies ?3. Therapist will engage Windsor in behavior activation, including incorporation of structure,  ?pleasant events, and mastery events into her routine ?4. Therapist will provide referrals for additional resources as appropriate  ?2. Falynn has difficulty setting boundaries in relationships and  ?prioritizing self-care ?Objective ?Riverlyn will develop strategies to support healthy communication and boundary setting in  ?relationships ?Target Date: 2022-01-16 Frequency: Biweekly ?Progress: 0 Modality: individual ?Related Interventions ?1. Therapist will provide Elena with communication strategies and opportunities to practice,  ?including writing down her thoughts, in-session processing of interactions, and in-vivo role  ?plays ?Diagnosis ?Axis  ?none ?300.02 (Generalized anxiety disorder) - Open - [Signifier: n/a]  ?Axis  ?none ?296.32 (Major depressive affective disorder, recurrent episode, moderate) - Open -  ?[Signifier: n/a]  ?Signed by: Charlyne Mom, PhD Version 2 Signed on: 2021-03-16 11:22:00 -0500 ?Generated on: 2022-12-07T15:59:08Z ?Client: [name] Edmonia Caprio) Eddie Candle ?DOB: 1945/09/30 ?Provider: Charlyne Mom, PhD ?Created on: 2021-03-16 11:10:24 -0500 ?[facility] ?[name] ?[place], [place] [address] ?[phone] ?Conditions For Discharge ?Achievement of treatment goals and objective ? ? ? ? ? ? ? ? ? ? ? ? ? ? ?[name], PhD ? ? ? ? ? ? ? ? ? ? ? ? ? ? ?[name], PhD ? ? ? ? ? ? ? ? ? ? ? ? ? ? ?[name], PhD ? ? ? ? ? ? ? ? ? ? ? ? ? ? ?[name],  PhD ? ? ? ? ? ? ? ? ? ? ? ? ? ? ?[place]  Jennette Banker, PhD ? ? ? ? ? ? ? ? ? ? ? ? ? ? ?[name], PhD ? ? ? ? ? ? ? ? ? ? ? ? ? ? ?[name], PhD ?

## 2021-10-04 ENCOUNTER — Ambulatory Visit: Payer: Medicare Other | Admitting: Cardiovascular Disease

## 2021-10-05 ENCOUNTER — Ambulatory Visit (INDEPENDENT_AMBULATORY_CARE_PROVIDER_SITE_OTHER): Payer: Medicare Other | Admitting: Clinical

## 2021-10-05 DIAGNOSIS — F331 Major depressive disorder, recurrent, moderate: Secondary | ICD-10-CM

## 2021-10-05 NOTE — Progress Notes (Addendum)
Diagnosis: F33.2, Major Depression, Recurrent, Moderate Time of session: 11:05am-11:53am CPT Code: [phone] (in person)  Gail Fuentes was seen in person for individual therapy. She had completed exercises from chapter 1 of The Artist's Way and reported an improvement in mood. She had planned a get-together with friends and had initiated plans with a neighbor. Therapist worked with her to consider how to approach an artist's date. She is scheduled to be seen again in two weeks, and will complete chapter 2 for homework.    Gail Noa, PhD  Treatment Plan Client Abilities/Strengths  Gail Fuentes presented as motivated to engage in therapy and able to describe her challenges.  Client Treatment Preferences  Gail Fuentes prefers in-person appointments  Client Statement of Needs  Gail Fuentes is seeking cognitive behavioral therapy to manage symptoms of depression and anxiety.  Treatment Level  Biweekly  Symptoms  Anxiety: Excessive worry, sleep irregularity, difficulty relaxing (Status: maintained). Depression:  Depressed mood, lack of motivation, withdrawal from previously enjoyed activities, hopelessness  (Status: maintained).  Problems Addressed  New Description, New Description  Goals 1. Gail Fuentes has a history of recurrent depression Objective Gail Fuentes will increase self esteem and self compassion Target Date: 2022-01-16 Frequency: Daily Progress: 0 Modality: individual Objective Gail Fuentes will reduce anxious planning behavior and accept her personal limitations Target Date: 2022-01-16 Frequency: Biweekly Signed by: Charlyne Mom, PhD Version 2 Signed on: 2021-03-16 11:22:00 -0500 Generated on: 2022-12-07T15:59:08Z [name] DOB: 01-May-1946 Provider: Charlyne Mom, PhD Created on: 2021-03-16 11:10:24 -0500 [facility] [name] [place], [place] [address] [phone] Progress: 0 Modality: individual Objective Gail Fuentes will improve her overall  mood and sense of well-being Target Date: 2022-01-16 Frequency: Biweekly Progress: 0 Modality: individual Related Interventions 1. Therapist will provide Gail Fuentes with opportunities to process her experiences in session 2. Therapist will help Gail Fuentes to identify and disengage from maladaptive thought and behavior  patterns using CBT based strategies 3. Therapist will engage Gail Fuentes in behavior activation, including incorporation of structure,  pleasant events, and mastery events into her routine 4. Therapist will provide referrals for additional resources as appropriate  2. Gail Fuentes has difficulty setting boundaries in relationships and  prioritizing self-care Objective Gail Fuentes will develop strategies to support healthy communication and boundary setting in  relationships Target Date: 2022-01-16 Frequency: Biweekly Progress: 0 Modality: individual Related Interventions 1. Therapist will provide Gail Fuentes with communication strategies and opportunities to practice,  including writing down her thoughts, in-session processing of interactions, and in-vivo role  plays Diagnosis Axis  none 300.02 (Generalized anxiety disorder) - Open - [Signifier: n/a]  Axis  none 296.32 (Major depressive affective disorder, recurrent episode, moderate) - Open -  [Signifier: n/a]  Signed by: Charlyne Mom, PhD Version 2 Signed on: 2021-03-16 11:22:00 -0500 Generated on: 2022-12-07T15:59:08Z [name] DOB: Sep 12, 1945 Provider: Charlyne Mom, PhD Created on: 2021-03-16 11:10:24 -0500 [facility] [name] [place], [place] [address] [phone] Conditions For Discharge Achievement of treatment goals and objective       Gail Noa, PhD               Gail Noa, PhD

## 2021-10-26 ENCOUNTER — Ambulatory Visit (INDEPENDENT_AMBULATORY_CARE_PROVIDER_SITE_OTHER): Payer: Medicare Other | Admitting: Clinical

## 2021-10-26 DIAGNOSIS — F331 Major depressive disorder, recurrent, moderate: Secondary | ICD-10-CM | POA: Diagnosis not present

## 2021-10-26 NOTE — Progress Notes (Signed)
Diagnosis: F33.2, Major Depression, Recurrent, Moderate Time of session: 2:00 pm-3:05pm CPT Code: [phone] (in person)  Gail Fuentes was seen in person for individual therapy. She reflected upon difficulty accepting the aging process, and balancing her sense of gratitude for the life she has with her sense of loss at not getting to do things she had wanted or has previously enjoyed. Therapist pointed out that, in order to bring more enjoyment in her life, she may need to intentionally prioritize it over other things. She had completed week 1 of The [employer]'s Way and plans to complete week 2 before her next session in two weeks.     Gail Noa, PhD  Treatment Plan Client Abilities/Strengths  Gail Fuentes presented as motivated to engage in therapy and able to describe her challenges.  Client Treatment Preferences  Gail Fuentes prefers in-person appointments  Client Statement of Needs  Gail Fuentes is seeking cognitive behavioral therapy to manage symptoms of depression and anxiety.  Treatment Level  Biweekly  Symptoms  Anxiety: Excessive worry, sleep irregularity, difficulty relaxing (Status: maintained). Depression:  Depressed mood, lack of motivation, withdrawal from previously enjoyed activities, hopelessness  (Status: maintained).  Problems Addressed  New Description, New Description  Goals 1. Gail Fuentes has a history of recurrent depression Objective Gail Fuentes will increase self esteem and self compassion Target Date: 2022-01-16 Frequency: Daily Progress: 0 Modality: individual Objective Gail Fuentes will reduce anxious planning behavior and accept her personal limitations Target Date: 2022-01-16 Frequency: Biweekly Signed by: Charlyne Mom, PhD Version 2 Signed on: 2021-03-16 11:22:00 -0500 Generated on: 2022-12-07T15:59:08Z [name] DOB: [birth year] Provider: Charlyne Mom, PhD Created on: 2021-03-16 11:10:24 -0500 [facility] [name] [place], [place] [address] [phone] Progress: 0 Modality: individual Objective Gail Fuentes will improve her overall mood and sense of well-being Target Date: 2022-01-16 Frequency: Biweekly Progress: 0 Modality: individual Related Interventions 1. Therapist will provide Gail Fuentes with opportunities to process her experiences in session 2. Therapist will help Gail Fuentes to identify and disengage from maladaptive thought and behavior  patterns using CBT based strategies 3. Therapist will engage Gail Fuentes in behavior activation, including incorporation of structure,  pleasant events, and mastery events into her routine 4. Therapist will provide referrals for additional resources as appropriate  2. Gail Fuentes has difficulty setting boundaries in relationships and  prioritizing self-care Objective Gail Fuentes will develop strategies to support healthy communication and boundary setting in  relationships Target Date: 2022-01-16 Frequency: Biweekly Progress: 0 Modality: individual Related Interventions 1. Therapist will provide [name] with communication strategies and opportunities to practice,  including writing down her thoughts, in-session processing of interactions, and in-vivo role  plays Diagnosis Axis  none 300.02 (Generalized anxiety disorder) - Open - [Signifier: n/a]  Axis  none 296.32 (Major depressive affective disorder, recurrent episode, moderate) - Open -  [Signifier: n/a]  Signed by: Charlyne Mom, PhD Version 2 Signed on: 2021-03-16 11:22:00 -0500 Generated on: 2022-12-07T15:59:08Z [name] DOB: 02/04/1946 Provider: Charlyne Mom, PhD Created on: 2021-03-16 11:10:24 -0500 [facility] [name] [place], [place] [address] [phone] Conditions For Discharge Achievement of treatment goals and objective      Gail Noa, PhD               Gail Noa, PhD

## 2021-11-09 ENCOUNTER — Ambulatory Visit (INDEPENDENT_AMBULATORY_CARE_PROVIDER_SITE_OTHER): Payer: Medicare Other | Admitting: Clinical

## 2021-11-09 DIAGNOSIS — F840 Autistic disorder: Secondary | ICD-10-CM

## 2021-11-09 NOTE — Progress Notes (Signed)
  Diagnosis: F33.2, Major Depression, Recurrent, Moderate Time of session: 2:00 pm-3:05pm CPT Code: [phone] (in person)  [name] was seen in person for individual therapy. She reported some improvement in mood. Session focused on reviewing exercises from The Artist's Way, which she described as helpful. She is scheduled to be seen again in two weeks.  [name], PhD  Treatment Plan Client Abilities/Strengths  [name] presented as motivated to engage in therapy and able to describe her challenges.  Client Treatment Preferences  [name] prefers in-person appointments  Client Statement of Needs  [name] is seeking cognitive behavioral therapy to manage symptoms of depression and anxiety.  Treatment Level  Biweekly  Symptoms  Anxiety: Excessive worry, sleep irregularity, difficulty relaxing (Status: maintained). Depression:  Depressed mood, lack of motivation, withdrawal from previously enjoyed activities, hopelessness  (Status: maintained).  Problems Addressed  New Description, New Description  Goals 1. [name] has a history of recurrent depression Objective Rivers will increase self esteem and self compassion Target Date: 2022-01-16 Frequency: Daily Progress: 0 Modality: individual Objective [name] will reduce anxious planning behavior and accept her personal limitations Target Date: 2022-01-16 Frequency: Biweekly Signed by: Charlyne Mom, PhD Version 2 Signed on: 2021-03-16 11:22:00 -0500 Generated on: 2022-12-07T15:59:08Z [name] DOB: Nov 02, 1945 Provider: Charlyne Mom, PhD Created on: 2021-03-16 11:10:24 -0500 [facility] [name] [place], [place] [address] [phone] Progress: 0 Modality: individual Objective Aniah will improve her overall mood and sense of well-being Target Date: 2022-01-16 Frequency: Biweekly Progress: 0 Modality: individual Related Interventions 1. Therapist will provide [name]  with opportunities to process her experiences in session 2. Therapist will help [name] to identify and disengage from maladaptive thought and behavior  patterns using CBT based strategies 3. Therapist will engage [name] in behavior activation, including incorporation of structure,  pleasant events, and mastery events into her routine 4. Therapist will provide referrals for additional resources as appropriate  2. [name] has difficulty setting boundaries in relationships and  prioritizing self-care Objective [name] will develop strategies to support healthy communication and boundary setting in  relationships Target Date: 2022-01-16 Frequency: Biweekly Progress: 0 Modality: individual Related Interventions 1. Therapist will provide [name] with communication strategies and opportunities to practice,  including writing down her thoughts, in-session processing of interactions, and in-vivo role  plays Diagnosis Axis  none 300.02 (Generalized anxiety disorder) - Open - [Signifier: n/a]  Axis  none 296.32 (Major depressive affective disorder, recurrent episode, moderate) - Open -  [Signifier: n/a]  Signed by: Charlyne Mom, PhD Version 2 Signed on: 2021-03-16 11:22:00 -0500 Generated on: 2022-12-07T15:59:08Z [name] DOB: [birth year] Provider: Charlyne Mom, PhD Created on: 2021-03-16 11:10:24 -0500 [facility] [name] [place], [place] [address] [phone] Conditions For Discharge Achievement of treatment goals and objective   [name], PhD

## 2021-11-22 ENCOUNTER — Ambulatory Visit (INDEPENDENT_AMBULATORY_CARE_PROVIDER_SITE_OTHER): Payer: Medicare Other | Admitting: Clinical

## 2021-11-22 ENCOUNTER — Ambulatory Visit (INDEPENDENT_AMBULATORY_CARE_PROVIDER_SITE_OTHER): Payer: Medicare Other | Admitting: Internal Medicine

## 2021-11-22 ENCOUNTER — Encounter: Payer: Self-pay | Admitting: Internal Medicine

## 2021-11-22 VITALS — BP 120/68 | HR 61 | Ht 65.0 in | Wt 187.8 lb

## 2021-11-22 DIAGNOSIS — Z8639 Personal history of other endocrine, nutritional and metabolic disease: Secondary | ICD-10-CM | POA: Diagnosis not present

## 2021-11-22 DIAGNOSIS — M8080XD Other osteoporosis with current pathological fracture, unspecified site, subsequent encounter for fracture with routine healing: Secondary | ICD-10-CM | POA: Diagnosis not present

## 2021-11-22 DIAGNOSIS — E039 Hypothyroidism, unspecified: Secondary | ICD-10-CM

## 2021-11-22 DIAGNOSIS — F331 Major depressive disorder, recurrent, moderate: Secondary | ICD-10-CM | POA: Diagnosis not present

## 2021-11-22 DIAGNOSIS — E041 Nontoxic single thyroid nodule: Secondary | ICD-10-CM

## 2021-11-22 DIAGNOSIS — E559 Vitamin D deficiency, unspecified: Secondary | ICD-10-CM | POA: Diagnosis not present

## 2021-11-22 LAB — VITAMIN D 25 HYDROXY (VIT D DEFICIENCY, FRACTURES): VITD: 60.13 ng/mL (ref 30.00–100.00)

## 2021-11-22 LAB — TSH: TSH: 0.14 u[IU]/mL — ABNORMAL LOW (ref 0.35–5.50)

## 2021-11-22 LAB — T4, FREE: Free T4: 1.18 ng/dL (ref 0.60–1.60)

## 2021-11-22 MED ORDER — ALENDRONATE SODIUM 70 MG PO TABS: 70.0000 mg | ORAL_TABLET | ORAL | 3 refills | Status: DC

## 2021-11-22 MED ORDER — LEVOTHYROXINE SODIUM 75 MCG PO TABS: 75.0000 ug | ORAL_TABLET | Freq: Every day | ORAL | 3 refills | Status: DC

## 2021-11-22 NOTE — Progress Notes (Signed)
Diagnosis: F33.2, Major Depression, Recurrent, Moderate Time of session: 1:00 pm-1:55pm CPT Code: [id number] (in person)  Zyona was seen in person for individual therapy. She reported feeling the same as in recent weeks, and queried whether she has made progress. Therapist processed this with her, and worked with her to consider changes she can make outside of session that will take her toward her goals. For homework, she will put herself first in at least one small way. She is scheduled to be seen again in two weeks.   [name], PhD  Treatment Plan Client Abilities/Strengths  Twanisha presented as motivated to engage in therapy and able to describe her challenges.  Client Treatment Preferences  Audrionna prefers in-person appointments  Client Statement of Needs  Jacalyn is seeking cognitive behavioral therapy to manage symptoms of depression and anxiety.  Treatment Level  Biweekly  Symptoms  Anxiety: Excessive worry, sleep irregularity, difficulty relaxing (Status: maintained). Depression:  Depressed mood, lack of motivation, withdrawal from previously enjoyed activities, hopelessness  (Status: maintained).  Problems Addressed  New Description, New Description  Goals 1. Donnella has a history of recurrent depression Objective Marilynne will increase self esteem and self compassion Target Date: 2022-01-16 Frequency: Daily Progress: 0 Modality: individual Objective Negar will reduce anxious planning behavior and accept her personal limitations Target Date: 2022-01-16 Frequency: Biweekly Signed by: Charlyne Mom, PhD Version 2 Signed on: 2021-03-16 11:22:00 -0500 Generated on: 2022-12-07T15:59:08Z [name] DOB: 12-18-1945 Provider: Charlyne Mom, PhD Created on: 2021-03-16 11:10:24 -0500 [facility] [name] [place], [place] [address] [phone] Progress: 0 Modality: individual Objective Asanti will improve her  overall mood and sense of well-being Target Date: 2022-01-16 Frequency: Biweekly Progress: 0 Modality: individual Related Interventions 1. Therapist will provide Kwynn with opportunities to process her experiences in session 2. Therapist will help Ainhoa to identify and disengage from maladaptive thought and behavior  patterns using CBT based strategies 3. Therapist will engage Shatasha in behavior activation, including incorporation of structure,  pleasant events, and mastery events into her routine 4. Therapist will provide referrals for additional resources as appropriate  2. Jamirah has difficulty setting boundaries in relationships and  prioritizing self-care Objective Kemiya will develop strategies to support healthy communication and boundary setting in  relationships Target Date: 2022-01-16 Frequency: Biweekly Progress: 0 Modality: individual Related Interventions 1. Therapist will provide Tayna with communication strategies and opportunities to practice,  including writing down her thoughts, in-session processing of interactions, and in-vivo role  plays Diagnosis Axis  none 300.02 (Generalized anxiety disorder) - Open - [Signifier: n/a]  Axis  none 296.32 (Major depressive affective disorder, recurrent episode, moderate) - Open -  [Signifier: n/a]  Signed by: Charlyne Mom, PhD Version 2 Signed on: 2021-03-16 11:22:00 -0500 Generated on: 2022-12-07T15:59:08Z [name] DOB: [birth year] Provider: Charlyne Mom, PhD Created on: 2021-03-16 11:10:24 -0500  [facility] [name] [place], [place] [address] [phone] Conditions For Discharge Achievement of treatment goals and objective   [name], PhD               [name], PhD

## 2021-11-22 NOTE — Patient Instructions (Addendum)
  Continue vitamin D 4000 units daily.  Continue Synthroid 100 mcg 5/7 days and 50 mcg 2/7 days.   Take the thyroid hormone every day, with water, at least 30 minutes before breakfast, separated by at least 4 hours from: - acid reflux medications - calcium - iron - multivitamins  Please stop at the lab.  Start Alendronate 70 mg weekly. Take this ~30 min before Levothyroxine.   Please come back for a follow-up appointment in 1 year.

## 2021-11-22 NOTE — Progress Notes (Signed)
Subjective:     Patient ID: Gail Fuentes, female   DOB: June 24, 1945, 76 y.o.   MRN: [id number]  HPI Gail Fuentes is a pleasant 76 y/o returning for f/u for history of hyperparathyroidism - s/p parathyroidectomy, hypothyroidism, and a R thyroid nodule. Last visit 1 year ago.  Interim history: No falls or fractures since last OV. No dizziness, vertigo, vision problems. Had cataract Sx in both eyes >> vision is better. She has sciatica. She also mentions mood swings, anxiety, depression,some insomnia.  Reviewed history: Pt had a thyroid ultrasound (10/08/2012) showing only a right thyroid nodule, 7 x 7 x 7 mm, with internal calcifications. On a subsequent thyroid U/S >> this nodule has disappeared.  A technetium sestamibi scan showed a left inferior parathyroid adenoma and a CT scan confirmed this. However, during surgery, it was found that she had a left superior parathyroid adenoma, which was excised - left superior parathyroidectomy with Dr Gerrit Friends in 02/2013.   She feels much better after surgery.  Her PTH and calcium levels normalized after surgery: Lab Results  Component Value Date   PTH 35 11/15/2015   PTH Comment 11/15/2015   PTH 41 11/15/2014   PTH Comment 11/15/2014   PTH 44 03/16/2014   PTH Comment 03/16/2014   CALCIUM 9.6 01/25/2021   CALCIUM 9.0 11/22/2020   CALCIUM 9.9 12/10/2019   CALCIUM 9.5 09/16/2019   CALCIUM 9.4 11/20/2018   CALCIUM 9.7 03/06/2018   CALCIUM 9.3 11/15/2017   CALCIUM 9.8 02/13/2017   CALCIUM 9.3 11/15/2016   CALCIUM 9.7 11/15/2015   CALCIUM 9.5 07/18/2015   CALCIUM 9.5 11/15/2014   CALCIUM 9.4 09/24/2014   CALCIUM 8.8 03/16/2014   CALCIUM 9.3 09/03/2013   No history of kidney stones but had an osteoporotic T score at the ultra distal radius per DXA scan in 2014.   + h/o R Styloid radial fracture 10/2018.  DXA scan ([facility]) 12/05/2020 - osteoporosis: - L1-L4: -2.3 (+2.9%) - RFN: -1.9 - LFN: -2.5 (-5.3%* for both  FN) -33% distal radius: -2.1 (-2.4%)  DXA scan ([facility]) 12/03/2018 - osteoporosis: - L1-L4: -2.5 (-3.7%*) - RFN: -2.0 - LFN: -1.9 (-1.3%* for both FN) -33% distal radius: -1.9 (-4.4%)  DXA scan ([facility]) 11/28/2016 - osteopenia: - L1-L4: -2.2 - RFN: -1.1 - LFN: -1.3 FRAX: Major osteoporotic fracture risk over 10 years: 13.7; hip fracture risk over 10 years: 2.1. But unfortunately appendicular skeleton was not checked.  I suggested Prolia after the 2020 bone density returned but she was afraid of side effects and did not start.  I then advised her to review information about Fosamax or Reclast, but she declines these, also.  She has FH of M - many fractures.  H/o vit D def  Previously on ergocalciferol, now on vitamin D 4000 units daily.  Reviewed vitamin D levels: Lab Results  Component Value Date   VD25OH 65.97 11/22/2020   VD25OH 72.94 09/16/2019   VD25OH 62.76 11/20/2018   VD25OH 25.82 (L) 11/15/2017   VD25OH 43.09 11/15/2016   VD25OH 45.13 11/15/2015   VD25OH 34.16 07/18/2015   VD25OH 30.52 10/12/2014   VD25OH 19.76 (L) 03/16/2014   VD25OH 35 09/03/2013   Hypothyroidism.  -Longstanding  Pt is on Synthroid d.a.w. 100 mcg 5/7 days and 50 mcg 2/7 days (dose decreased 11/2020), taken: - in am - coffee + creamer - >30 min later - fasting - at least 30-60 min from b'fast - no Ca, Fe, MVI, +  PPIs later in the day (>4h later) - not on Biotin  Reviewed TFTs: Lab Results  Component Value Date   TSH 0.45 01/03/2021   TSH 0.15 (L) 11/22/2020   TSH 0.57 09/16/2019   TSH 1.19 11/20/2018   TSH 0.77 05/19/2018   FREET4 0.99 01/03/2021   FREET4 1.31 11/22/2020   FREET4 1.04 11/20/2018   FREET4 0.97 11/15/2017   FREET4 0.88 11/15/2016   Pt denies: - feeling nodules in neck - hoarseness - dysphagia - choking  Review of Systems + see HPI  I reviewed pt's medications, allergies, PMH, social hx, family hx, and changes were documented in  the history of present illness. Otherwise, unchanged from my initial visit note.  Past Medical History:  Diagnosis Date   Abnormal EKG    per patient- states evaluated by Dr Daleen Squibb in the past   Allergy    sneezing  late Fall   Anemia 01/23/12   H/H 11.7/35   Anxiety state, unspecified    Arthritis    Benign paroxysmal positional vertigo    Depression    Depressive disorder, not elsewhere classified    Diverticulosis    Esophageal reflux    Esophageal stricture    Gallstones    Heart murmur    Hiatal hernia    History of hyperparathyroidism    History of UTI    HTN (hypertension)    Hyperlipidemia    OSA (obstructive sleep apnea) 05/27/2018   Other abnormal glucose    Personal history of other diseases of digestive system    12-15 since 1990, no recurrence 2006   PONV (postoperative nausea and vomiting)    Unspecified hypothyroidism    Vitamin D deficiency    Past Surgical History:  Procedure Laterality Date   CATARACT EXTRACTION, BILATERAL Bilateral 12/06/2019   [facility]   CESAREAN SECTION  05/08/1975   CHOLECYSTECTOMY  05/07/2002   COLONOSCOPY  12/06/2003   [facility]   DENTAL SURGERY  1962, 2012   Age 54-Abscessed Teeth Extracted    PARATHYROIDECTOMY Left 02/13/2013   Procedure: LEFT INFERIOR PARATHYROIDECTOMY with frozen section ;  Surgeon: Velora Heckler, MD;  Location: [facility];  Service: General;  Laterality: Left;   TOTAL ABDOMINAL HYSTERECTOMY W/ BILATERAL SALPINGOOPHORECTOMY  05/08/2003   Ovarian Cyst   Social History   Socioeconomic History   Marital status: Married    Spouse name: Not on file   Number of children: 1   Years of education: Not on file   Highest education level: Not on file  Occupational History   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    Employer: RETIRED   Occupation: retired Visual merchandiser  Tobacco Use   Smoking status: Former    Types: Cigarettes    Quit date: 05/07/1973    Years since quitting: 48.5   Smokeless tobacco: Never   Tobacco  comments:    Smoked 1968-1975 up to 2 ppd  Vaping Use   Vaping Use: Never used  Substance and Sexual Activity   Alcohol use: Yes    Alcohol/week: 14.0 standard drinks of alcohol    Types: 14 Standard drinks or equivalent per week    Comment: 2 nightly wines   Drug use: No   Sexual activity: Not Currently  Other Topics Concern   Not on file  Social History Narrative   Regular exercise: no   Caffeine use: daily   Social Determinants of Health   Financial Resource Strain: Low Risk  (01/14/2021)   Overall Physicist, medical Strain (  CARDIA)    Difficulty of Paying Living Expenses: Not hard at all  Food Insecurity: No Food Insecurity (01/14/2021)   Hunger Vital Sign    Worried About Running Out of Food in the Last Year: Never true    Ran Out of Food in the Last Year: Never true  Transportation Needs: No Transportation Needs (01/14/2021)   PRAPARE - Administrator, Civil Service (Medical): No    Lack of Transportation (Non-Medical): No  Physical Activity: Insufficiently Active (01/14/2021)   Exercise Vital Sign    Days of Exercise per Week: 3 days    Minutes of Exercise per Session: 30 min  Stress: Stress Concern Present (01/14/2021)   [employer] of Occupational Health - Occupational Stress Questionnaire    Feeling of Stress : To some extent  Social Connections: Moderately Integrated (01/14/2021)   Social Connection and Isolation Panel [NHANES]    Frequency of Communication with Friends and Family: More than three times a week    Frequency of Social Gatherings with Friends and Family: More than three times a week    Attends Religious Services: Never    Database administrator or Organizations: Yes    Attends Banker Meetings: 1 to 4 times per year    Marital Status: Married  Catering manager Violence: Not At Risk (01/14/2021)   Humiliation, Afraid, Rape, and Kick questionnaire    Fear of Current or Ex-Partner: No    Emotionally Abused: No    Physically  Abused: No    Sexually Abused: No   Current Outpatient Medications on File Prior to Visit  Medication Sig Dispense Refill   ALPRAZolam (XANAX) 0.5 MG tablet TAKE 1/2-1 TABLETS BY MOUTH 2 TIMES DAILY AS NEEDED FOR ANXIETY. 30 tablet 0   azelastine (ASTELIN) 0.1 % nasal spray Place 1 spray into both nostrils 2 (two) times daily. Use in each nostril as directed 30 mL 0   Cholecalciferol (VITAMIN D3) 2000 UNITS TABS Take 2 tablets by mouth daily.      cyanocobalamin 100 MCG tablet Take 100 mcg by mouth daily.     FLUoxetine (PROZAC) 40 MG capsule Take 1 capsule (40 mg total) by mouth daily. 90 capsule 3   fluticasone (FLONASE) 50 MCG/ACT nasal spray Place 1 spray into both nostrils daily. 16 g 2   levothyroxine (SYNTHROID) 100 MCG tablet TAKE 1 TABLET BY MOUTH EVERY DAY AND 1/2 TABLET ON SUNDAY 90 tablet 3   losartan (COZAAR) 50 MG tablet Take 1 tablet (50 mg total) by mouth daily. 90 tablet 3   pantoprazole (PROTONIX) 40 MG tablet Take 1 tablet (40 mg total) by mouth daily. 90 tablet 3   No current facility-administered medications on file prior to visit.   Allergies  Allergen Reactions   Augmentin [Amoxicillin-Pot Clavulanate] Rash   Family History  Problem Relation Age of Onset   Coronary artery disease Father    Heart attack Father 59   COPD Father    Diabetes Mother    Dysrhythmia Mother    Depression Mother    COPD Mother    Colon polyps Mother    Kidney disease Mother    Prostate cancer Brother    Other Brother        Wegener's in sinus; [facility]   Heart disease Brother    Arthritis Brother    Hypertension Brother    Prostate cancer Brother    Cancer Brother        Soft tissue in the  face/temple   Stroke Maternal Aunt        >65   Stroke Maternal Grandmother        >65   Stroke Maternal Grandfather 47    Objective:   Physical Exam There were no vitals taken for this visit. There is no height or weight on file to calculate BMI. Wt Readings from Last 3 Encounters:   08/04/21 194 lb (88 kg)  01/25/21 200 lb 6.4 oz (90.9 kg)  01/14/21 201 lb (91.2 kg)   Constitutional: overweight, in NAD Eyes: PERRLA, EOMI, no exophthalmos ENT: moist mucous membranes, no thyromegaly, no cervical lymphadenopathy Cardiovascular: RRR, No MRG Respiratory: CTA B Gastrointestinal: abdomen soft, NT, ND, BS+ Musculoskeletal: no deformities, strength intact in all 4 Skin: moist, warm, no rashes Neurological: no tremor with outstretched hands, DTR normal in all 4  Assessment:     1.  History of primary hyperparathyroidism - status post left superior parathyroidectomy 02/13/2013, by Dr. Gerrit Friends - calcium < 1 mg/dl above normal before the surgery - elevated PTH (133, 238) - normal vit D  - elevated 1,25 vitamin D - no renal failure - osteopenia  - no h/o kidney stones - DXA 01/23/2010 ([facility]): L1-4 T score -2.0 Left femoral neck T score -1.9 Right femoral neck T score -1.4 10 year any fracture risk 13.8%, 10 year hip fracture risk 2.9% - CT neck 01/14/2013:   Focal nodular appearance along the posterior aspect of the left lobe of thyroid corresponds with a sestamibi exam and suggests a left lower parathyroid adenoma.  No significant nodular tissue adjacent to the submandibular glands.  Sub centimeter lymph nodes are likely within normal limits. Focal calcification is noted within the right lobe of the thyroid.  - Tc sestamibi scan 12/29/2012: suspicious for left thyroid lobe region parathyroid  adenoma. Questionable abnormal focus also in the right submandibular space - DXA 11/14/2012 ([facility]): L1-4 T score -2.0 >> -2.1 (but high SD, L1 T score is -2.8) Left femoral neck T score -1.9 >> -1.9 Right femoral neck T score -1.4 >> -1.7 33% distal radius: -1.5 But worrisome that UD radius -3.1 (good evaluation for trabecular bone) - UCa 300 mg/24h (11/06/2012)   She had parathyroidectomy with Dr Gerrit Friends on 02/13/2013.   - DXA 11/28/2016 ([facility]): - L1-L4:  -2.2 - RFN: -1.1 - LFN: -1.3 FRAX: Major osteoporotic fracture risk over 10 years: 13.7; hip fracture risk over 10 years: 2.1.  2. Thyroid nodule - thyroid ultrasound 10/08/2012: Thyroid echotexture is heterogeneous.  Right lobe: Measures 3 x 1.2 x 1.1 cm.  Left lobe: Measures 2.9 x 1.9 x 0.9 cm.  Isthmus: Measures 0.4 cm.  Focal lesions: Within the lower pole of the right lobe of thyroid gland there is a hypoechoic nodule containing coarsened calcifications. This measures 7 x 7 x 7 mm.  No lymphadenopathy identified. - thyroid U/S 03/22/2014: Right thyroid lobe: 3.4 x 1.1 x 1.0 cm. Stable to slightly smaller focal area of nodularity with dystrophic shadowing calcifications measures approximately 0.6 cm in diameter. The thyroid parenchyma is very heterogeneous and shows no abnormal vascularity. Left thyroid lobe: 2.9 x 1.1 x 1.1 cm. No nodules visualized. Heterogeneous parenchyma noted without abnormal vascularity. The left lobe is small in size.  Isthmus Thickness: 0.9 cm.  No nodules visualized. Lymphadenopathy None visualized.  3. Hypothyroidism   4. vitamin D deficiency  5. OP  Plan:     1.  History of primary Hyperparathyroidism: -Patient with history of primary hyperparathyroidism  with minimally elevated calcium but high PTH levels in the past.  She is now s/p parathyroidectomy in 02/2013 by Dr. Gerrit Friends.  At that time, she had resection of a 0.5 g adenoma.  Subsequent calcium and PTH levels were normal. -Most recent calcium level was normal in 12/2019: Lab Results  Component Value Date   CALCIUM 9.6 01/25/2021   CALCIUM 9.0 11/22/2020   2. R thyroid nodule -She had a 7 mm thyroid nodule with internal calcifications which was stable and even smaller on the latest thyroid ultrasound -No neck compression symptoms -No follow-up ultrasound needed unless she becomes symptomatic with neck compression symptoms or a nodule becomes obvious on palpation  3. Hypothyroidism - latest  thyroid labs reviewed with pt. >> normal: Lab Results  Component Value Date   TSH 0.45 01/03/2021  - she continues on LT4 100 mcg 5/7 days and 50 mcg 2/7 days, decreased at last visit - pt feels good on this dose. - we discussed about taking the thyroid hormone every day, with water, >30 minutes before breakfast, separated by >4 hours from acid reflux medications, calcium, iron, multivitamins. Pt. is taking it correctly. - will check thyroid tests today: TSH and fT4 - If labs are abnormal, she will need to return for repeat TFTs in 1.5 months  4. Vit D deficiency -Her vitamin D level was normal at last visit: Lab Results  Component Value Date   VD25OH 65.97 11/22/2020  -She continues on 4000 units vitamin D daily -We will recheck a vitamin D level today  5.  Osteoporosis  -Reviewed previous DXA scan reports: She had osteoporosis at the radial side before her parathyroid surgery. DXA scan from 2019 showed an improvement in the T-scores, however, they were checked on another bone density machine.  She had a repeat DXA scan in 11/2018 and this showed worsening of her spine and hips T-scores.  We did discuss about possible treatment for osteoporosis for her and I suggested Prolia after we discussed at length about possible side effects and the very good safety profile along with the magnitude of the fracture risk reduction.  Her co-pay for Prolia injection was high, $255.  She declined the injection.  She also declined alendronate or zoledronic acid. -She had a new bone density scan performed last year and this showed a significant decrease in bone density at the left femoral neck, but otherwise nonsignificant changes -We again discussed about options for treatment and she agrees to try Xanax.  Discussed about taking it with a full glass of water and not lay down after she takes it.  Also, to take it 30 minutes before her levothyroxine. -Encouraged weightbearing exercises 5 out of 7 days.  Also  discussed about balance exercises.  Component     Latest Ref Rng 11/22/2021  TSH     0.35 - 5.50 uIU/mL 0.14 (L)   T4,Free(Direct)     0.60 - 1.60 ng/dL 8.11   VITD     91.47 - 100.00 ng/mL 60.13     Vitamin D is at goal.  TSH, however, is suppressed. We will advise her to decrease the dose of levothyroxine to 75 mcg daily and repeat the test in 1.5 months.  Carlus Pavlov, MD PhD [facility] Endocr[facility]

## 2021-12-06 ENCOUNTER — Ambulatory Visit (INDEPENDENT_AMBULATORY_CARE_PROVIDER_SITE_OTHER): Payer: Medicare Other | Admitting: Clinical

## 2021-12-06 DIAGNOSIS — F331 Major depressive disorder, recurrent, moderate: Secondary | ICD-10-CM

## 2021-12-06 NOTE — Progress Notes (Signed)
Diagnosis: F33.2, Major Depression, Recurrent, Moderate Time of session: 10:00 am-10:55 am CPT Code: 91478G (in person)  Starlina was seen in person for individual therapy. She reported in improvement in mood and overall acceptance of various issues in her life, which she attributed to changes in her thyroid medication. Session focused on reflecting upon various experiences she has had and how they influence her currently. She is scheduled to be seen again in 6 weeks, and therapist will reach out as cancellations happen.  [name], PhD  Treatment Plan Client Abilities/Strengths  Lanessa presented as motivated to engage in therapy and able to describe her challenges.  Client Treatment Preferences  Jenniferlynn prefers in-person appointments  Client Statement of Needs  Teyonna is seeking cognitive behavioral therapy to manage symptoms of depression and anxiety.  Treatment Level  Biweekly  Symptoms  Anxiety: Excessive worry, sleep irregularity, difficulty relaxing (Status: maintained). Depression:  Depressed mood, lack of motivation, withdrawal from previously enjoyed activities, hopelessness  (Status: maintained).  Problems Addressed  New Description, New Description  Goals 1. Jamarria has a history of recurrent depression Objective Deshonda will increase self esteem and self compassion Target Date: 2022-01-16 Frequency: Daily Progress: 0 Modality: individual Objective Gretna will reduce anxious planning behavior and accept her personal limitations Target Date: 2022-01-16 Frequency: Biweekly Signed by: Charlyne Mom, PhD Version 2 Signed on: 2021-03-16 11:22:00 -0500 Generated on: 2022-12-07T15:59:08Z [name] DOB: 1946-03-04 Provider: Charlyne Mom, PhD Created on: 2021-03-16 11:10:24 -0500 [facility] [name] [place], [place] [address] [phone] Progress: 0 Modality: individual Objective Asmara will improve her  overall mood and sense of well-being Target Date: 2022-01-16 Frequency: Biweekly Progress: 0 Modality: individual Related Interventions 1. Therapist will provide Maurice with opportunities to process her experiences in session 2. Therapist will help Delrae to identify and disengage from maladaptive thought and behavior  patterns using CBT based strategies 3. Therapist will engage Margia in behavior activation, including incorporation of structure,  pleasant events, and mastery events into her routine 4. Therapist will provide referrals for additional resources as appropriate  2. Bellagrace has difficulty setting boundaries in relationships and  prioritizing self-care Objective Autie will develop strategies to support healthy communication and boundary setting in  relationships Target Date: 2022-01-16 Frequency: Biweekly Progress: 0 Modality: individual Related Interventions 1. Therapist will provide Kathe with communication strategies and opportunities to practice,  including writing down her thoughts, in-session processing of interactions, and in-vivo role  plays Diagnosis Axis  none 300.02 (Generalized anxiety disorder) - Open - [Signifier: n/a]  Axis  none 296.32 (Major depressive affective disorder, recurrent episode, moderate) - Open -  [Signifier: n/a]  Signed by: Charlyne Mom, PhD Version 2 Signed on: 2021-03-16 11:22:00 -0500 Generated on: 2022-12-07T15:59:08Z [name] DOB: [birth year] Provider: Charlyne Mom, PhD Created on: 2021-03-16 11:10:24 -0500 [facility] [name] [place], [place] [address] [phone] Conditions For Discharge Achievement of treatment goals and objective      [name], PhD               [name], PhD

## 2022-01-12 ENCOUNTER — Other Ambulatory Visit (INDEPENDENT_AMBULATORY_CARE_PROVIDER_SITE_OTHER): Payer: Medicare Other

## 2022-01-12 DIAGNOSIS — E039 Hypothyroidism, unspecified: Secondary | ICD-10-CM | POA: Diagnosis not present

## 2022-01-12 LAB — TSH: TSH: 0.64 u[IU]/mL (ref 0.35–5.50)

## 2022-01-12 LAB — T4, FREE: Free T4: 0.86 ng/dL (ref 0.60–1.60)

## 2022-01-16 ENCOUNTER — Ambulatory Visit (INDEPENDENT_AMBULATORY_CARE_PROVIDER_SITE_OTHER): Payer: Medicare Other | Admitting: *Deleted

## 2022-01-16 ENCOUNTER — Encounter: Payer: Self-pay | Admitting: Family Medicine

## 2022-01-16 VITALS — BP 109/69 | HR 62 | Ht 65.0 in | Wt 188.6 lb

## 2022-01-16 DIAGNOSIS — Z0000 Encounter for general adult medical examination without abnormal findings: Secondary | ICD-10-CM | POA: Diagnosis not present

## 2022-01-16 DIAGNOSIS — Z23 Encounter for immunization: Secondary | ICD-10-CM | POA: Diagnosis not present

## 2022-01-16 NOTE — Patient Instructions (Signed)
[name] , Thank you for taking time to come for your Medicare Wellness Visit. I appreciate your ongoing commitment to your health goals. Please review the following plan we discussed and let me know if I can assist you in the future.   These are the goals we discussed:  Goals      DIET - EAT MORE FRUITS AND VEGETABLES     Increase physical activity     To be out and more social.     Wants to try to forget about the past Live in the present - stay healthy Do not create stress/worry before it occurs     Would like to extablish a group of female friends        This is a list of the screening recommended for you and due dates:  Health Maintenance  Topic Date Due   Zoster (Shingles) Vaccine (1 of 2) Never done   COVID-19 Vaccine (4 - Moderna series) 06/16/2020   Mammogram  01/04/2023   Colon Cancer Screening  11/18/2024   Tetanus Vaccine  07/17/2025   Pneumonia Vaccine  Completed   Flu Shot  Completed   DEXA scan (bone density measurement)  Completed   Hepatitis C Screening: USPSTF Recommendation to screen - Ages 19-79 yo.  Completed   HPV Vaccine  Aged Out       Next appointment: Follow up in one year for your annual wellness visit    Preventive Care 65 Years and Older, Female Preventive care refers to lifestyle choices and visits with your health care provider that can promote health and wellness. What does preventive care include? A yearly physical exam. This is also called an annual well check. Dental exams once or twice a year. Routine eye exams. Ask your health care provider how often you should have your eyes checked. Personal lifestyle choices, including: Daily care of your teeth and gums. Regular physical activity. Eating a healthy diet. Avoiding tobacco and drug use. Limiting alcohol use. Practicing safe sex. Taking low-dose aspirin every day. Taking vitamin and mineral supplements as recommended by your health care provider. What happens during an  annual well check? The services and screenings done by your health care provider during your annual well check will depend on your age, overall health, lifestyle risk factors, and family history of disease. Counseling  Your health care provider may ask you questions about your: Alcohol use. Tobacco use. Drug use. Emotional well-being. Home and relationship well-being. Sexual activity. Eating habits. History of falls. Memory and ability to understand (cognition). Work and work Astronomer. Reproductive health. Screening  You may have the following tests or measurements: Height, weight, and BMI. Blood pressure. Lipid and cholesterol levels. These may be checked every 5 years, or more frequently if you are over 52 years old. Skin check. Lung cancer screening. You may have this screening every year starting at age 69 if you have a 30-pack-year history of smoking and currently smoke or have quit within the past 15 years. Fecal occult blood test (FOBT) of the stool. You may have this test every year starting at age 35. Flexible sigmoidoscopy or colonoscopy. You may have a sigmoidoscopy every 5 years or a colonoscopy every 10 years starting at age 24. Hepatitis C blood test. Hepatitis B blood test. Sexually transmitted disease (STD) testing. Diabetes screening. This is done by checking your blood sugar (glucose) after you have not eaten for a while (fasting). You may have this done every 1-3 years. Bone density scan. This  is done to screen for osteoporosis. You may have this done starting at age 25. Mammogram. This may be done every 1-2 years. Talk to your health care provider about how often you should have regular mammograms. Talk with your health care provider about your test results, treatment options, and if necessary, the need for more tests. Vaccines  Your health care provider may recommend certain vaccines, such as: Influenza vaccine. This is recommended every year. Tetanus,  diphtheria, and acellular pertussis (Tdap, Td) vaccine. You may need a Td booster every 10 years. Zoster vaccine. You may need this after age 14. Pneumococcal 13-valent conjugate (PCV13) vaccine. One dose is recommended after age 46. Pneumococcal polysaccharide (PPSV23) vaccine. One dose is recommended after age 22. Talk to your health care provider about which screenings and vaccines you need and how often you need them. This information is not intended to replace advice given to you by your health care provider. Make sure you discuss any questions you have with your health care provider. Document Released: 05/20/2015 Document Revised: 01/11/2016 Document Reviewed: 02/22/2015 Elsevier Interactive Patient Education  2017 [place] Prevention in the Home Falls can cause injuries. They can happen to people of all ages. There are many things you can do to make your home safe and to help prevent falls. What can I do on the outside of my home? Regularly fix the edges of walkways and driveways and fix any cracks. Remove anything that might make you trip as you walk through a door, such as a raised step or threshold. Trim any bushes or trees on the path to your home. Use bright outdoor lighting. Clear any walking paths of anything that might make someone trip, such as rocks or tools. Regularly check to see if handrails are loose or broken. Make sure that both sides of any steps have handrails. Any raised decks and porches should have guardrails on the edges. Have any leaves, snow, or ice cleared regularly. Use sand or salt on walking paths during winter. Clean up any spills in your garage right away. This includes oil or grease spills. What can I do in the bathroom? Use night lights. Install grab bars by the toilet and in the tub and shower. Do not use towel bars as grab bars. Use non-skid mats or decals in the tub or shower. If you need to sit down in the shower, use a plastic,  non-slip stool. Keep the floor dry. Clean up any water that spills on the floor as soon as it happens. Remove soap buildup in the tub or shower regularly. Attach bath mats securely with double-sided non-slip rug tape. Do not have throw rugs and other things on the floor that can make you trip. What can I do in the bedroom? Use night lights. Make sure that you have a light by your bed that is easy to reach. Do not use any sheets or blankets that are too big for your bed. They should not hang down onto the floor. Have a firm chair that has side arms. You can use this for support while you get dressed. Do not have throw rugs and other things on the floor that can make you trip. What can I do in the kitchen? Clean up any spills right away. Avoid walking on wet floors. Keep items that you use a lot in easy-to-reach places. If you need to reach something above you, use a strong step stool that has a grab bar. Keep electrical cords out  of the way. Do not use floor polish or wax that makes floors slippery. If you must use wax, use non-skid floor wax. Do not have throw rugs and other things on the floor that can make you trip. What can I do with my stairs? Do not leave any items on the stairs. Make sure that there are handrails on both sides of the stairs and use them. Fix handrails that are broken or loose. Make sure that handrails are as long as the stairways. Check any carpeting to make sure that it is firmly attached to the stairs. Fix any carpet that is loose or worn. Avoid having throw rugs at the top or bottom of the stairs. If you do have throw rugs, attach them to the floor with carpet tape. Make sure that you have a light switch at the top of the stairs and the bottom of the stairs. If you do not have them, ask someone to add them for you. What else can I do to help prevent falls? Wear shoes that: Do not have high heels. Have rubber bottoms. Are comfortable and fit you well. Are closed  at the toe. Do not wear sandals. If you use a stepladder: Make sure that it is fully opened. Do not climb a closed stepladder. Make sure that both sides of the stepladder are locked into place. Ask someone to hold it for you, if possible. Clearly mark and make sure that you can see: Any grab bars or handrails. First and last steps. Where the edge of each step is. Use tools that help you move around (mobility aids) if they are needed. These include: Canes. Walkers. Scooters. Crutches. Turn on the lights when you go into a dark area. Replace any light bulbs as soon as they burn out. Set up your furniture so you have a clear path. Avoid moving your furniture around. If any of your floors are uneven, fix them. If there are any pets around you, be aware of where they are. Review your medicines with your doctor. Some medicines can make you feel dizzy. This can increase your chance of falling. Ask your doctor what other things that you can do to help prevent falls. This information is not intended to replace advice given to you by your health care provider. Make sure you discuss any questions you have with your health care provider. Document Released: 02/17/2009 Document Revised: 09/29/2015 Document Reviewed: 05/28/2014 Elsevier Interactive Patient Education  2017 [employer].

## 2022-01-16 NOTE — Progress Notes (Signed)
Subjective:   Gail Fuentes is a 76 y.o. female who presents for Medicare Annual (Subsequent) preventive examination.  Review of Systems    Defer to PCP Cardiac Risk Factors include: advanced age (>38men, >8 women);hypertension;sedentary lifestyle     Objective:    Today's Vitals   01/16/22 0822  BP: 109/69  Pulse: 62  Weight: 188 lb 9.6 oz (85.5 kg)  Height: 5\' 5"  (1.651 m)   Body mass index is 31.38 kg/m.     01/16/2022    8:24 AM 01/14/2021   10:08 AM 11/27/2018    3:07 PM 11/13/2018    1:15 PM 11/22/2017   10:15 AM 11/21/2016    9:15 AM 11/19/2014    1:15 PM  Advanced Directives  Does Patient Have a Medical Advance Directive? Yes Yes Yes Yes Yes Yes Yes  Type of 11/21/2014 of [place];Living will Healthcare Power of [place];Living will Healthcare Power of [place];Living will  Healthcare Power of [place];Living will Healthcare Power of [place];Living will Healthcare Power of [place];Living will  Does patient want to make changes to medical advance directive? No - Patient declined  No - Patient declined No - Patient declined     Copy of Healthcare Power of Attorney in Chart? No - copy requested No - copy requested No - copy requested  No - copy requested No - copy requested   Would patient like information on creating a medical advance directive? No - Patient declined          Current Medications (verified) Outpatient Encounter Medications as of 01/16/2022  Medication Sig   alendronate (FOSAMAX) 70 MG tablet Take 1 tablet (70 mg total) by mouth every 7 (seven) days. Take with a full glass of water on an empty stomach.   ALPRAZolam (XANAX) 0.5 MG tablet TAKE 1/2-1 TABLETS BY MOUTH 2 TIMES DAILY AS NEEDED FOR ANXIETY.   azelastine (ASTELIN) 0.1 % nasal spray Place 1 spray into both nostrils 2 (two) times daily. Use in each nostril as directed   Cholecalciferol (VITAMIN D3) 2000 UNITS TABS Take 2 tablets by mouth daily.    cyanocobalamin 100  MCG tablet Take 100 mcg by mouth daily.   FLUoxetine (PROZAC) 40 MG capsule Take 1 capsule (40 mg total) by mouth daily.   fluticasone (FLONASE) 50 MCG/ACT nasal spray Place 1 spray into both nostrils daily.   levothyroxine (SYNTHROID) 75 MCG tablet Take 1 tablet (75 mcg total) by mouth daily.   losartan (COZAAR) 50 MG tablet Take 1 tablet (50 mg total) by mouth daily.   pantoprazole (PROTONIX) 40 MG tablet Take 1 tablet (40 mg total) by mouth daily.   No facility-administered encounter medications on file as of 01/16/2022.    Allergies (verified) Augmentin [amoxicillin-pot clavulanate]   History: Past Medical History:  Diagnosis Date   Abnormal EKG    per patient- states evaluated by Dr 03/18/2022 in the past   Allergy    sneezing  late Fall   Anemia 01/23/12   H/H 11.7/35   Anxiety state, unspecified    Arthritis    Benign paroxysmal positional vertigo    Depression    Depressive disorder, not elsewhere classified    Diverticulosis    Esophageal reflux    Esophageal stricture    Gallstones    Heart murmur    Hiatal hernia    History of hyperparathyroidism    History of UTI    HTN (hypertension)    Hyperlipidemia    OSA (obstructive sleep  apnea) 05/27/2018   Other abnormal glucose    Personal history of other diseases of digestive system    12-15 since 1990, no recurrence 2006   PONV (postoperative nausea and vomiting)    Unspecified hypothyroidism    Vitamin D deficiency    Past Surgical History:  Procedure Laterality Date   CATARACT EXTRACTION, BILATERAL Bilateral 12/06/2019   [facility]   CESAREAN SECTION  05/08/1975   CHOLECYSTECTOMY  05/07/2002   COLONOSCOPY  12/06/2003   [facility]   DENTAL SURGERY  1962, 2012   Age 15-Abscessed Teeth Extracted    PARATHYROIDECTOMY Left 02/13/2013   Procedure: LEFT INFERIOR PARATHYROIDECTOMY with frozen section ;  Surgeon: Velora Heckler, MD;  Location: [facility];  Service: General;  Laterality: Left;   TOTAL ABDOMINAL HYSTERECTOMY W/  BILATERAL SALPINGOOPHORECTOMY  05/08/2003   Ovarian Cyst   Family History  Problem Relation Age of Onset   Coronary artery disease Father    Heart attack Father 39   COPD Father    Diabetes Mother    Dysrhythmia Mother    Depression Mother    COPD Mother    Colon polyps Mother    Kidney disease Mother    Prostate cancer Brother    Other Brother        Wegener's in sinus; [facility]   Heart disease Brother    Arthritis Brother    Hypertension Brother    Prostate cancer Brother    Cancer Brother        Soft tissue in the face/temple   Stroke Maternal Aunt        >65   Stroke Maternal Grandmother        >65   Stroke Maternal Grandfather 81   Social History   Socioeconomic History   Marital status: Married    Spouse name: Not on file   Number of children: 1   Years of education: Not on file   Highest education level: Not on file  Occupational History   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    Employer: RETIRED   Occupation: retired Visual merchandiser  Tobacco Use   Smoking status: Former    Types: Cigarettes    Quit date: 05/07/1973    Years since quitting: 48.7   Smokeless tobacco: Never   Tobacco comments:    Smoked 1968-1975 up to 2 ppd  Vaping Use   Vaping Use: Never used  Substance and Sexual Activity   Alcohol use: Yes    Alcohol/week: 14.0 standard drinks of alcohol    Types: 14 Standard drinks or equivalent per week    Comment: 2 nightly wines   Drug use: No   Sexual activity: Not Currently  Other Topics Concern   Not on file  Social History Narrative   Regular exercise: no   Caffeine use: daily   Social Determinants of Health   Financial Resource Strain: Low Risk  (01/14/2021)   Overall Financial Resource Strain (CARDIA)    Difficulty of Paying Living Expenses: Not hard at all  Food Insecurity: No Food Insecurity (01/14/2021)   Hunger Vital Sign    Worried About Running Out of Food in the Last Year: Never true    Ran Out of Food in the Last Year: Never  true  Transportation Needs: No Transportation Needs (01/14/2021)   PRAPARE - Administrator, Civil Service (Medical): No    Lack of Transportation (Non-Medical): No  Physical Activity: Insufficiently Active (01/14/2021)   Exercise Vital Sign  Days of Exercise per Week: 3 days    Minutes of Exercise per Session: 30 min  Stress: Stress Concern Present (01/14/2021)   [employer] of Occupational Health - Occupational Stress Questionnaire    Feeling of Stress : To some extent  Social Connections: Moderately Integrated (01/14/2021)   Social Connection and Isolation Panel [NHANES]    Frequency of Communication with Friends and Family: More than three times a week    Frequency of Social Gatherings with Friends and Family: More than three times a week    Attends Religious Services: Never    Database administrator or Organizations: Yes    Attends Banker Meetings: 1 to 4 times per year    Marital Status: Married    Tobacco Counseling Counseling given: Not Answered Tobacco comments: Smoked [phone] up to 2 ppd   Clinical Intake:  Pre-visit preparation completed: Yes  Pain : No/denies pain     Diabetes: No  How often do you need to have someone help you when you read instructions, pamphlets, or other written materials from your doctor or pharmacy?: 1 - Never  Diabetic? No   Activities of Daily Living    01/16/2022    8:26 AM  In your present state of health, do you have any difficulty performing the following activities:  Hearing? 0  Vision? 0  Difficulty concentrating or making decisions? 1  Comment making decisions  Walking or climbing stairs? 0  Dressing or bathing? 0  Doing errands, shopping? 0  Preparing Food and eating ? N  Using the Toilet? N  In the past six months, have you accidently leaked urine? Y  Do you have problems with loss of bowel control? N  Managing your Medications? N  Managing your Finances? N  Housekeeping or managing  your Housekeeping? N    Patient Care Team: Copland, Gwenlyn Found, MD as PCP - General (Family Medicine) Kathleene Hazel, MD as PCP - Cardiology (Cardiology) Carlus Pavlov, MD as Consulting Physician (Internal Medicine) Freddy Finner, MD (Inactive) as Consulting Physician (Obstetrics and Gynecology) Manning Charity, OD as Referring Physician (Optometry)  Indicate any recent Medical Services you may have received from other than [facility] providers in the past year (date may be approximate).     Assessment:   This is a routine wellness examination for [place].  Hearing/Vision screen No results found.  Dietary issues and exercise activities discussed: Current Exercise Habits: The patient does not participate in regular exercise at present, Exercise limited by: None identified   Goals Addressed   None    Depression Screen    01/16/2022    8:25 AM 01/14/2021   10:01 AM 11/02/2019   11:16 AM 11/27/2018    3:19 PM 11/22/2017   10:15 AM 11/21/2016    9:16 AM 11/21/2016    8:38 AM  PHQ 2/9 Scores  PHQ - 2 Score 1 2 2 1 1  0 0  PHQ- 9 Score  7 9        Fall Risk    01/16/2022    8:25 AM 01/14/2021   10:09 AM 11/04/2020    1:11 PM 11/27/2018    3:19 PM 11/22/2017   10:15 AM  Fall Risk   Falls in the past year? 0 0 0 1 No  Number falls in past yr: 0 0 0 0   Injury with Fall? 0 0 0 1   Risk for fall due to : No Fall Risks Orthopedic patient;Medication side effect  Follow up Falls evaluation completed Falls prevention discussed       FALL RISK PREVENTION PERTAINING TO THE HOME:  Any stairs in or around the home? Yes  If so, are there any without handrails? No  Home free of loose throw rugs in walkways, pet beds, electrical cords, etc? Yes  Adequate lighting in your home to reduce risk of falls? Yes   ASSISTIVE DEVICES UTILIZED TO PREVENT FALLS:  Life alert? No  Use of a cane, walker or w/c? No  Grab bars in the bathroom? No  Shower chair or bench in shower? Yes  Elevated  toilet seat or a handicapped toilet? Yes   TIMED UP AND GO:  Was the test performed? Yes .  Length of time to ambulate 10 feet: 13 sec.   Gait slow and steady without use of assistive device  Cognitive Function:    11/22/2017   10:17 AM  MMSE - Mini Mental State Exam  Orientation to time 5  Orientation to Place 5  Registration 3  Attention/ Calculation 5  Recall 3  Language- name 2 objects 2  Language- repeat 1  Language- follow 3 step command 3  Language- read & follow direction 1  Write a sentence 1  Copy design 1  Total score 30        01/16/2022    8:30 AM  6CIT Screen  What Year? 0 points  What month? 0 points  What time? 0 points  Count back from 20 0 points  Months in reverse 0 points  Repeat phrase 2 points  Total Score 2 points    Immunizations Immunization History  Administered Date(s) Administered   Fluad Quad(high Dose 65+) 01/25/2021, 01/16/2022   Influenza Split 03/11/2012, 02/22/2017   Influenza Whole 02/20/2008, 03/06/2010   Influenza, High Dose Seasonal PF 03/17/2013, 02/22/2017, 01/15/2018   Influenza,inj,Quad PF,6+ Mos 03/04/2014   Influenza-Unspecified 03/08/2015, 03/06/2016, 01/27/2018   Moderna Sars-Covid-2 Vaccination 06/08/2019, 06/28/2019   PFIZER(Purple Top)SARS-COV-2 Vaccination 04/21/2020   Pneumococcal Conjugate-13 07/18/2015   Pneumococcal Polysaccharide-23 03/06/2013   Td 07/18/2015    TDAP status: Up to date  Flu Vaccine status: Completed at today's visit  Pneumococcal vaccine status: Up to date  Covid-19 vaccine status: Information provided on how to obtain vaccines.   Qualifies for Shingles Vaccine? Yes   Zostavax completed No   Shingrix Completed?: No.    Education has been provided regarding the importance of this vaccine. Patient has been advised to call insurance company to determine out of pocket expense if they have not yet received this vaccine. Advised may also receive vaccine at local pharmacy or Health Dept.  Verbalized acceptance and understanding.  Screening Tests Health Maintenance  Topic Date Due   Zoster Vaccines- Shingrix (1 of 2) Never done   COVID-19 Vaccine (4 - Moderna series) 06/16/2020   MAMMOGRAM  01/04/2023   COLONOSCOPY (Pts 45-84yrs Insurance coverage will need to be confirmed)  11/18/2024   TETANUS/TDAP  07/17/2025   Pneumonia Vaccine 12+ Years old  Completed   INFLUENZA VACCINE  Completed   DEXA SCAN  Completed   Hepatitis C Screening  Completed   HPV VACCINES  Aged Out    Health Maintenance  Health Maintenance Due  Topic Date Due   Zoster Vaccines- Shingrix (1 of 2) Never done   COVID-19 Vaccine (4 - Moderna series) 06/16/2020    Colorectal cancer screening: Type of screening: Colonoscopy. Completed 11/19/14. Repeat every 10 years  Mammogram status: Completed 01/03/21. Repeat every year  Bone Density status: Completed 12/05/20. Results reflect: Bone density results: OSTEOPOROSIS. Repeat every 2 years.  Lung Cancer Screening: (Low Dose CT Chest recommended if Age 57-80 years, 30 pack-year currently smoking OR have quit w/in 15years.) does not qualify.   Lung Cancer Screening Referral: N/a  Additional Screening:  Hepatitis C Screening: does qualify; Completed 07/18/15  Vision Screening: Recommended annual ophthalmology exams for early detection of glaucoma and other disorders of the eye. Is the patient up to date with their annual eye exam?  Yes  Who is the provider or what is the name of the office in which the patient attends annual eye exams? Dr. Antony Contras If pt is not established with a provider, would they like to be referred to a provider to establish care? No .   Dental Screening: Recommended annual dental exams for proper oral hygiene  Community Resource Referral / Chronic Care Management: CRR required this visit?  No   CCM required this visit?  No      Plan:     I have personally reviewed and noted the following in the patient's chart:    Medical and social history Use of alcohol, tobacco or illicit drugs  Current medications and supplements including opioid prescriptions. Patient is not currently taking opioid prescriptions. Functional ability and status Nutritional status Physical activity Advanced directives List of other physicians Hospitalizations, surgeries, and ER visits in previous 12 months Vitals Screenings to include cognitive, depression, and falls Referrals and appointments  In addition, I have reviewed and discussed with patient certain preventive protocols, quality metrics, and best practice recommendations. A written personalized care plan for preventive services as well as general preventive health recommendations were provided to patient.     Donne Anon, [place]   01/16/2022   Nurse Notes: None

## 2022-01-23 ENCOUNTER — Ambulatory Visit: Payer: Medicare Other | Admitting: Clinical

## 2022-01-30 ENCOUNTER — Ambulatory Visit (INDEPENDENT_AMBULATORY_CARE_PROVIDER_SITE_OTHER): Payer: Medicare Other | Admitting: Clinical

## 2022-01-30 DIAGNOSIS — F411 Generalized anxiety disorder: Secondary | ICD-10-CM | POA: Diagnosis not present

## 2022-01-30 DIAGNOSIS — F331 Major depressive disorder, recurrent, moderate: Secondary | ICD-10-CM

## 2022-01-30 NOTE — Progress Notes (Signed)
Diagnosis: F33.2, Major Depression, Recurrent, Moderate,  F41.1 Time of session: 9:00 am-9:59 am CPT Code: [phone] (in person)  Gail Fuentes was seen in person for individual therapy. She reported little change in anxiety symptoms, and therapist provided psychoeducation on generalized anxiety disorder. Therapist suggested mindfulness excercises she can try, including listing three things she can see, hear, and touch, and intentionally considering how she wants segments of her day to go before she embarks on them. She is scheduled to be seen again in one week, with plan to verbally review and update treatment plan.  Gail Cruise, PhD  Treatment Plan Client Abilities/Strengths  Gail Fuentes presented as motivated to engage in therapy and able to describe her challenges.  Client Treatment Preferences  Gail Fuentes prefers in-person appointments  Client Statement of Needs  Gail Fuentes is seeking cognitive behavioral therapy to manage symptoms of depression and anxiety.  Treatment Level  Biweekly  Symptoms  Anxiety: Excessive worry, sleep irregularity, difficulty relaxing (Status: maintained). Depression:  Depressed mood, lack of motivation, withdrawal from previously enjoyed activities, hopelessness  (Status: maintained).  Problems Addressed  New Description, New Description  Goals 1. Gail Fuentes has a history of recurrent depression Objective Gail Fuentes will increase self esteem and self compassion Target Date: 2022-01-16 Frequency: Daily Progress: 0 Modality: individual Objective Gail Fuentes will reduce anxious planning behavior and accept her personal limitations Target Date: 2022-01-16 Frequency: Biweekly Signed by: Laroy Apple, PhD Version 2 Signed on: 2021-03-16 11:22:00 -0500 Generated on: 2022-12-07T15:59:08Z [name] DOB: [birth year] Provider: Laroy Apple, PhD Created on: 2021-03-16 11:10:24 -[place] [address] [place], [place]  [address] [phone] Progress: 0 Modality: individual Objective Gail Fuentes will improve her overall mood and sense of well-being Target Date: 2023-01-17 Frequency: Biweekly Progress: 0 Modality: individual Related Interventions 1. Therapist will provide Gail Fuentes with opportunities to process her experiences in session 2. Therapist will help Gail Fuentes to identify and disengage from maladaptive thought and behavior  patterns using CBT based strategies 3. Therapist will engage Gail Fuentes in behavior activation, including incorporation of structure,  pleasant events, and mastery events into her routine 4. Therapist will provide referrals for additional resources as appropriate  2. Gail Fuentes has difficulty setting boundaries in relationships and  prioritizing self-care Objective Gail Fuentes will develop strategies to support healthy communication and boundary setting in  relationships Target Date: 2023-01-17 Frequency: Biweekly Progress: 0 Modality: individual Related Interventions 1. Therapist will provide Gail Fuentes with communication strategies and opportunities to practice,  including writing down her thoughts, in-session processing of interactions, and in-vivo role  plays Diagnosis Axis  none 300.02 (Generalized anxiety disorder) - Open - [Signifier: n/a]  Axis  none 296.32 (Major depressive affective disorder, recurrent episode, moderate) - Open -  [Signifier: n/a]  Signed by: Laroy Apple, PhD Version 2 Signed on: 2021-03-16 11:22:00 -0500 Generated on: 2022-12-07T15:59:08Z [name] DOB: [birth year] Provider: Laroy Apple, PhD Created on: 2021-03-16 11:10:24 -[place] [address] [place], [place] [address] [phone] Conditions For Discharge Achievement of treatment goals and objective         Gail Cruise, PhD               Gail Cruise, PhD

## 2022-01-30 NOTE — Addendum Note (Signed)
Addended by: [name] on: 01/30/2022 02:11 PM   Modules accepted: Level of Service

## 2022-02-02 NOTE — Progress Notes (Unsigned)
[facility] [address], [address] [place], [place] [address] [phone]  Date:  02/05/2022   Name:  Gail Fuentes   DOB:  [birth year]   MRN:  [id number]  PCP:  Pearline Cables, MD    Chief Complaint: No chief complaint on file.   History of Present Illness:  Gail Fuentes is a 76 y.o. very pleasant female patient who presents with the following:  Patient here today for periodic follow-up and labs Most recent visit with myself was about 1 year ago History of cardiomegaly, hypertension, sleep apnea, hypothyroidism, hyperlipidemia, vitamin D deficiency and osteoporosis, mild pre-diabetes   She sees Dr. Elvera Lennox for her hyperparathyroidism-most recent visit in July She underwent a parathyroidectomy in 2014-her calcium has looked okay Dr. Elvera Lennox did start Fosamax at her last visit for decrease in bone density  Seen by cardiology, Dr. Clifton James in [place], mild LVH noted on previous echo in 2018  Fosamax Alprazolam as needed Prozac 40 Levothyroxine 75 Losartan 50 Pantoprazole  Can follow-up on labs today-however vitamin D was checked in July, normal at 60 TSH checked recently, normal Her last 2 B12 levels have been slightly above normal range  Shingrix Recommend COVID booster this fall Flu vaccine already done 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   History of hyperparathyroidism 08/24/2014   Obesity (BMI 30-39.9) 10/08/2013   HTN (hypertension) 07/13/2013   Elevated blood pressure reading without diagnosis of hypertension 06/29/2013   Osteoporosis 11/14/2012   Acute iritis of both  eyes 10/17/2012   Thyroid nodule 10/17/2012   Cardiomegaly 02/20/2012   DISTURBANCES OF SENSATION OF SMELL AND TASTE 04/19/2010   Vitamin D deficiency 12/24/2008   ANXIETY 12/24/2008   DEPRESSION 12/24/2008   BENIGN PAROXYSMAL POSITIONAL VERTIGO 12/24/2008   HYPERGLYCEMIA 12/24/2008   Nonspecific abnormal electrocardiogram (ECG) (EKG) 12/24/2008   Hyperlipidemia 07/23/2007   Hypothyroidism 07/22/2007   GERD 07/22/2007   DIVERTICULITIS, HX OF 07/22/2007    Past Medical History:  Diagnosis Date   Abnormal EKG    per patient- states evaluated by Dr Daleen Squibb in the past   Allergy    sneezing  late Fall   Anemia 01/23/12   H/H 11.7/35   Anxiety state, unspecified    Arthritis    Benign paroxysmal positional vertigo    Depression    Depressive disorder, not elsewhere classified    Diverticulosis    Esophageal reflux    Esophageal stricture    Gallstones    Heart murmur    Hiatal hernia    History of hyperparathyroidism    History of UTI    HTN (hypertension)    Hyperlipidemia    OSA (obstructive sleep apnea) 05/27/2018   Other abnormal glucose    Personal history of other diseases of digestive system    12-15 since 1990, no recurrence 2006   PONV (postoperative nausea and vomiting)    Unspecified hypothyroidism    Vitamin D deficiency     Past Surgical History:  Procedure Laterality Date   CATARACT EXTRACTION, BILATERAL Bilateral 12/06/2019   [facility]   CESAREAN SECTION  05/08/1975   CHOLECYSTECTOMY  05/07/2002  COLONOSCOPY  12/06/2003   [facility]   DENTAL SURGERY  1962, 2012   Age 89-Abscessed Teeth Extracted    PARATHYROIDECTOMY Left 02/13/2013   Procedure: LEFT INFERIOR PARATHYROIDECTOMY with frozen section ;  Surgeon: Earnstine Regal, MD;  Location: [facility];  Service: General;  Laterality: Left;   TOTAL ABDOMINAL HYSTERECTOMY W/ BILATERAL SALPINGOOPHORECTOMY  05/08/2003   Ovarian Cyst    Social History   Tobacco Use   Smoking status: Former    Types: Cigarettes     Quit date: 05/07/1973    Years since quitting: 48.7   Smokeless tobacco: Never   Tobacco comments:    Smoked 1968-1975 up to 2 ppd  Vaping Use   Vaping Use: Never used  Substance Use Topics   Alcohol use: Yes    Alcohol/week: 14.0 standard drinks of alcohol    Types: 14 Standard drinks or equivalent per week    Comment: 2 nightly wines   Drug use: No    Family History  Problem Relation Age of Onset   Coronary artery disease Father    Heart attack Father 30   COPD Father    Diabetes Mother    Dysrhythmia Mother    Depression Mother    COPD Mother    Colon polyps Mother    Kidney disease Mother    Prostate cancer Brother    Other Brother        Wegener's in sinus; [facility]   Heart disease Brother    Arthritis Brother    Hypertension Brother    Prostate cancer Brother    Cancer Brother        Soft tissue in the face/temple   Stroke Maternal Aunt        >65   Stroke Maternal Grandmother        >65   Stroke Maternal Grandfather 47    Allergies  Allergen Reactions   Augmentin [Amoxicillin-Pot Clavulanate] Rash    Medication list has been reviewed and updated.  Current Outpatient Medications on File Prior to Visit  Medication Sig Dispense Refill   alendronate (FOSAMAX) 70 MG tablet Take 1 tablet (70 mg total) by mouth every 7 (seven) days. Take with a full glass of water on an empty stomach. 12 tablet 3   ALPRAZolam (XANAX) 0.5 MG tablet TAKE 1/2-1 TABLETS BY MOUTH 2 TIMES DAILY AS NEEDED FOR ANXIETY. 30 tablet 0   azelastine (ASTELIN) 0.1 % nasal spray Place 1 spray into both nostrils 2 (two) times daily. Use in each nostril as directed 30 mL 0   Cholecalciferol (VITAMIN D3) 2000 UNITS TABS Take 2 tablets by mouth daily.      cyanocobalamin 100 MCG tablet Take 100 mcg by mouth daily.     FLUoxetine (PROZAC) 40 MG capsule Take 1 capsule (40 mg total) by mouth daily. 90 capsule 3   fluticasone (FLONASE) 50 MCG/ACT nasal spray Place 1 spray into both nostrils daily. 16 g 2    levothyroxine (SYNTHROID) 75 MCG tablet Take 1 tablet (75 mcg total) by mouth daily. 45 tablet 3   losartan (COZAAR) 50 MG tablet Take 1 tablet (50 mg total) by mouth daily. 90 tablet 3   pantoprazole (PROTONIX) 40 MG tablet Take 1 tablet (40 mg total) by mouth daily. 90 tablet 3   No current facility-administered medications on file prior to visit.    Review of Systems:  As per HPI- otherwise negative.   Physical Examination: There were no vitals filed for this visit. There were  no vitals filed for this visit. There is no height or weight on file to calculate BMI. Ideal Body Weight:    GEN: no acute distress. HEENT: Atraumatic, Normocephalic.  Ears and Nose: No external deformity. CV: RRR, No M/G/R. No JVD. No thrill. No extra heart sounds. PULM: CTA B, no wheezes, crackles, rhonchi. No retractions. No resp. distress. No accessory muscle use. ABD: S, NT, ND, +BS. No rebound. No HSM. EXTR: No c/c/e PSYCH: Normally interactive. Conversant.    Assessment and Plan: ***  Signed Lamar Blinks, MD

## 2022-02-02 NOTE — Patient Instructions (Incomplete)
It was great to see you again today, I will be in touch with your labs as soon as possible Let me know if you would like to change over to a different medication for anxiety and depression I am glad to make a change for you if you like!    Recommend Shingrix  (if not done already) and covid booster this fall at your pharmacy  Also consider the RSV vaccine at your pharmacy  If your dentist has any concern about the Fosamax please call me!    Assuming all is ok let's visit in 6 months

## 2022-02-05 ENCOUNTER — Ambulatory Visit (INDEPENDENT_AMBULATORY_CARE_PROVIDER_SITE_OTHER): Payer: Medicare Other | Admitting: Family Medicine

## 2022-02-05 ENCOUNTER — Encounter: Payer: Self-pay | Admitting: Family Medicine

## 2022-02-05 VITALS — BP 108/64 | HR 67 | Temp 97.6°F | Ht 65.0 in | Wt 191.8 lb

## 2022-02-05 DIAGNOSIS — E538 Deficiency of other specified B group vitamins: Secondary | ICD-10-CM

## 2022-02-05 DIAGNOSIS — I10 Essential (primary) hypertension: Secondary | ICD-10-CM

## 2022-02-05 DIAGNOSIS — E039 Hypothyroidism, unspecified: Secondary | ICD-10-CM

## 2022-02-05 DIAGNOSIS — Z131 Encounter for screening for diabetes mellitus: Secondary | ICD-10-CM

## 2022-02-05 DIAGNOSIS — E782 Mixed hyperlipidemia: Secondary | ICD-10-CM

## 2022-02-05 DIAGNOSIS — F411 Generalized anxiety disorder: Secondary | ICD-10-CM

## 2022-02-05 DIAGNOSIS — Z8639 Personal history of other endocrine, nutritional and metabolic disease: Secondary | ICD-10-CM

## 2022-02-05 DIAGNOSIS — F339 Major depressive disorder, recurrent, unspecified: Secondary | ICD-10-CM | POA: Diagnosis not present

## 2022-02-05 LAB — LIPID PANEL
Cholesterol: 236 mg/dL — ABNORMAL HIGH (ref 0–200)
HDL: 77.9 mg/dL (ref >39.00)
LDL Cholesterol: 137 mg/dL — ABNORMAL HIGH (ref 0–99)
NonHDL: 158.17
Total CHOL/HDL Ratio: 3
Triglycerides: 106 mg/dL (ref 0.0–149.0)
VLDL: 21.2 mg/dL (ref 0.0–40.0)

## 2022-02-05 LAB — COMPREHENSIVE METABOLIC PANEL
ALT: 13 U/L (ref 0–35)
AST: 22 U/L (ref 0–37)
Albumin: 4 g/dL (ref 3.5–5.2)
Alkaline Phosphatase: 64 U/L (ref 39–117)
BUN: 10 mg/dL (ref 6–23)
CO2: 26 mEq/L (ref 19–32)
Calcium: 9.2 mg/dL (ref 8.4–10.5)
Chloride: 101 mEq/L (ref 96–112)
Creatinine, Ser: 0.95 mg/dL (ref 0.40–1.20)
GFR: 58.81 mL/min — ABNORMAL LOW (ref >60.00)
Glucose, Bld: 72 mg/dL (ref 70–99)
Potassium: 4.2 mEq/L (ref 3.5–5.1)
Sodium: 136 mEq/L (ref 135–145)
Total Bilirubin: 0.5 mg/dL (ref 0.2–1.2)
Total Protein: 6.4 g/dL (ref 6.0–8.3)

## 2022-02-05 LAB — CBC
HCT: 36.7 % (ref 36.0–46.0)
Hemoglobin: 12.2 g/dL (ref 12.0–15.0)
MCHC: 33.4 g/dL (ref 30.0–36.0)
MCV: 90.7 fl (ref 78.0–100.0)
Platelets: 313 10*3/uL (ref 150.0–400.0)
RBC: 4.04 Mil/uL (ref 3.87–5.11)
RDW: 13.7 % (ref 11.5–15.5)
WBC: 6.4 10*3/uL (ref 4.0–10.5)

## 2022-02-05 LAB — HEMOGLOBIN A1C: Hgb A1c MFr Bld: 5.7 % (ref 4.6–6.5)

## 2022-02-05 LAB — TSH: TSH: 0.59 u[IU]/mL (ref 0.35–5.50)

## 2022-02-07 ENCOUNTER — Ambulatory Visit (INDEPENDENT_AMBULATORY_CARE_PROVIDER_SITE_OTHER): Payer: Medicare Other | Admitting: Clinical

## 2022-02-07 DIAGNOSIS — F331 Major depressive disorder, recurrent, moderate: Secondary | ICD-10-CM | POA: Diagnosis not present

## 2022-02-07 NOTE — Progress Notes (Signed)
Diagnosis: F33.2, Major Depression, Recurrent, Moderate,  F41.1 Time of session: 10:00 am-10:59 am CPT Code: [id number] (in person)  Gail Fuentes was seen in person for individual therapy. She reported stable anxiety symptoms, and indicated a plan to speak with her PCP about the possibility of medication that is specific for anxiety. Session focused on upcoming events in her family. She expressed anxiety related to lack of clear plans, and therapist encouraged her to develop a plan that centers on her own needs and limits, communicate this, and allow others to figure out the rest. She is scheduled to be seen again in two weeks.  [name], PhD  Treatment Plan Client Abilities/Strengths  Gail Fuentes presented as motivated to engage in therapy and able to describe her challenges.  Client Treatment Preferences  Gail Fuentes prefers in-person appointments  Client Statement of Needs  Gail Fuentes is seeking cognitive behavioral therapy to manage symptoms of depression and anxiety.  Treatment Level  Biweekly  Symptoms  Anxiety: Excessive worry, sleep irregularity, difficulty relaxing (Status: maintained). Depression:  Depressed mood, lack of motivation, withdrawal from previously enjoyed activities, hopelessness  (Status: maintained).  Problems Addressed  New Description, New Description  Goals 1. Gail Fuentes has a history of recurrent depression Objective Gail Fuentes will increase self esteem and self compassion Target Date: 2022-01-16 Frequency: Daily Progress: 0 Modality: individual Objective Gail Fuentes will reduce anxious planning behavior and accept her personal limitations Target Date: 2022-01-16 Frequency: Biweekly Signed by: Laroy Apple, PhD Version 2 Signed on: 2021-03-16 11:22:00 -0500 Generated on: 2022-12-07T15:59:08Z [name] DOB: 03/30/1946 Provider: Laroy Apple, PhD Created on: 2021-03-16 11:10:24 -[place] [address] [place], [place] [address] [phone] Progress: 0 Modality: individual Objective Gail Fuentes will improve her overall mood and sense of well-being Target Date: 2023-01-17 Frequency: Biweekly Progress: 0 Modality: individual Related Interventions 1. Therapist will provide Gail Fuentes with opportunities to process her experiences in session 2. Therapist will help Gail Fuentes to identify and disengage from maladaptive thought and behavior  patterns using CBT based strategies 3. Therapist will engage Gail Fuentes in behavior activation, including incorporation of structure,  pleasant events, and mastery events into her routine 4. Therapist will provide referrals for additional resources as appropriate  2. Gail Fuentes has difficulty setting boundaries in relationships and  prioritizing self-care Objective Gail Fuentes will develop strategies to support healthy communication and boundary setting in  relationships Target Date: 2023-01-17 Frequency: Biweekly Progress: 0 Modality: individual Related Interventions 1. Therapist will provide Gail Fuentes with communication strategies and opportunities to practice,  including writing down her thoughts, in-session processing of interactions, and in-vivo role  plays Diagnosis Axis  none 300.02 (Generalized anxiety disorder) - Open - [Signifier: n/a]  Axis  none 296.32 (Major depressive affective disorder, recurrent episode, moderate) - Open -  [Signifier: n/a]  Signed by: Laroy Apple, PhD Version 2 Signed on: 2021-03-16 11:22:00 -0500 Generated on: 2022-12-07T15:59:08Z [name] DOB: Oct 13, 1945 Provider: Laroy Apple, PhD Created on: 2021-03-16 11:10:24 -[place] [address] [place], [place] [address] [phone] Conditions For Discharge Achievement of treatment goals and objective          [name], PhD               [name], PhD

## 2022-02-14 ENCOUNTER — Ambulatory Visit (INDEPENDENT_AMBULATORY_CARE_PROVIDER_SITE_OTHER): Payer: Medicare Other | Admitting: Clinical

## 2022-02-14 DIAGNOSIS — F331 Major depressive disorder, recurrent, moderate: Secondary | ICD-10-CM | POA: Diagnosis not present

## 2022-02-14 NOTE — Progress Notes (Signed)
Diagnosis: F33.2, Major Depression, Recurrent, Moderate,  F41.1 Time of session: 1:00 pm-1:59 pm CPT Code: [id number] (in person)  Hayleigh was seen in person for individual therapy. She reported that she has been coping well with the visit from her family, and has been able to set boundaries. Session focused on mentally preparing for the upcoming wedding of her niece, as well as processing her growing concern that her husband is experiencing memory loss. Therapist provided validation and support, and offered to help find a referral for a neurologist. She is scheduled to be seen again in one week.  [name], PhD  Treatment Plan Client Abilities/Strengths  Megann presented as motivated to engage in therapy and able to describe her challenges.  Client Treatment Preferences  Satomi prefers in-person appointments  Client Statement of Needs  Guenevere is seeking cognitive behavioral therapy to manage symptoms of depression and anxiety.  Treatment Level  Biweekly  Symptoms  Anxiety: Excessive worry, sleep irregularity, difficulty relaxing (Status: maintained). Depression:  Depressed mood, lack of motivation, withdrawal from previously enjoyed activities, hopelessness  (Status: maintained).  Problems Addressed  New Description, New Description  Goals 1. Domique has a history of recurrent depression Objective Cristy will increase self esteem and self compassion Target Date: 2022-01-16 Frequency: Daily Progress: 0 Modality: individual Objective Jamonica will reduce anxious planning behavior and accept her personal limitations Target Date: 2022-01-16 Frequency: Biweekly Signed by: Laroy Apple, PhD Version 2 Signed on: 2021-03-16 11:22:00 -0500 Generated on: 2022-12-07T15:59:08Z [name] DOB: 1946-04-30 Provider: Laroy Apple, PhD Created on: 2021-03-16 11:10:24 -[place] [address] [place], [place]  [address] [phone] Progress: 0 Modality: individual Objective Kavitha will improve her overall mood and sense of well-being Target Date: 2023-01-17 Frequency: Biweekly Progress: 0 Modality: individual Related Interventions 1. Therapist will provide Satoria with opportunities to process her experiences in session 2. Therapist will help Rahima to identify and disengage from maladaptive thought and behavior  patterns using CBT based strategies 3. Therapist will engage Jaide in behavior activation, including incorporation of structure,  pleasant events, and mastery events into her routine 4. Therapist will provide referrals for additional resources as appropriate  2. Arneta has difficulty setting boundaries in relationships and  prioritizing self-care Objective Mahnoor will develop strategies to support healthy communication and boundary setting in  relationships Target Date: 2023-01-17 Frequency: Biweekly Progress: 0 Modality: individual Related Interventions 1. Therapist will provide Rhoda with communication strategies and opportunities to practice,  including writing down her thoughts, in-session processing of interactions, and in-vivo role  plays Diagnosis Axis  none 300.02 (Generalized anxiety disorder) - Open - [Signifier: n/a]  Axis  none 296.32 (Major depressive affective disorder, recurrent episode, moderate) - Open -  [Signifier: n/a]  Signed by: Laroy Apple, PhD Version 2 Signed on: 2021-03-16 11:22:00 -0500 Generated on: 2022-12-07T15:59:08Z [name] DOB: Apr 17, 1946 Provider: Laroy Apple, PhD Created on: 2021-03-16 11:10:24 -[place] [address] [place], [place] [address] [phone] Conditions For Discharge Achievement of treatment goals and objective             [name], PhD               [name], PhD

## 2022-02-20 ENCOUNTER — Other Ambulatory Visit: Payer: Self-pay | Admitting: Family Medicine

## 2022-02-20 DIAGNOSIS — I10 Essential (primary) hypertension: Secondary | ICD-10-CM

## 2022-02-20 DIAGNOSIS — K219 Gastro-esophageal reflux disease without esophagitis: Secondary | ICD-10-CM

## 2022-02-23 ENCOUNTER — Ambulatory Visit (INDEPENDENT_AMBULATORY_CARE_PROVIDER_SITE_OTHER): Payer: Medicare Other | Admitting: Clinical

## 2022-02-23 DIAGNOSIS — F331 Major depressive disorder, recurrent, moderate: Secondary | ICD-10-CM

## 2022-02-23 NOTE — Progress Notes (Signed)
Diagnosis: F33.2, Major Depression, Recurrent, Moderate,  F41.1 Time of session: 11:42 pm-12:43 pm CPT Code: 99242A (in person)  Gail Fuentes was seen in person for individual therapy. She reported that the remainder of her visit with family had wrapped up well. Session focused on dynamics in her relationship. Therapist processed this with her, suggesting that Gail Fuentes's tendency to take care of things for herself may limit her husbands opportunities to show love for her through acts of service. Therapist suggested communication strategies she can use to communicate her needs and desire for connection. She is scheduled to be seen again in one week.  Gail Cruise, PhD  Treatment Plan Gail Fuentes Abilities/Strengths  Gail Fuentes presented as motivated to engage in therapy and able to describe her challenges.  Gail Fuentes Treatment Preferences  Gail Fuentes prefers in-person appointments  Gail Fuentes Statement of Needs  Gail Fuentes is seeking cognitive behavioral therapy to manage symptoms of depression and anxiety.  Treatment Level  Biweekly  Symptoms  Anxiety: Excessive worry, sleep irregularity, difficulty relaxing (Status: maintained). Depression:  Depressed mood, lack of motivation, withdrawal from previously enjoyed activities, hopelessness  (Status: maintained).  Problems Addressed  New Description, New Description  Goals 1. Gail Fuentes has a history of recurrent depression Objective Gail Fuentes will increase self esteem and self compassion Target Date: 2022-01-16 Frequency: Daily Progress: 0 Modality: individual Objective Gail Fuentes will reduce anxious planning behavior and accept her personal limitations Target Date: 2022-01-16 Frequency: Biweekly Signed by: Laroy Apple, PhD Version 2 Signed on: 2021-03-16 11:22:00 -0500 Generated on: 2022-12-07T15:59:08Z Gail Fuentes: Gail Fuentes DOB: Aug 10, 1945 Provider: Laroy Apple, PhD Created on: 2021-03-16 11:10:24 -[place]  [address] [place], [place] [address] [phone] Progress: 0 Modality: individual Objective Gail Fuentes will improve her overall mood and sense of well-being Target Date: 2023-01-17 Frequency: Biweekly Progress: 0 Modality: individual Related Interventions 1. Therapist will provide [name] with opportunities to process her experiences in session 2. Therapist will help Gail Fuentes to identify and disengage from maladaptive thought and behavior  patterns using CBT based strategies 3. Therapist will engage Gail Fuentes in behavior activation, including incorporation of structure,  pleasant events, and mastery events into her routine 4. Therapist will provide referrals for additional resources as appropriate  2. Gail Fuentes has difficulty setting boundaries in relationships and  prioritizing self-care Objective Gail Fuentes will develop strategies to support healthy communication and boundary setting in  relationships Target Date: 2023-01-17 Frequency: Biweekly Progress: 0 Modality: individual Related Interventions 1. Therapist will provide Gail Fuentes with communication strategies and opportunities to practice,  including writing down her thoughts, in-session processing of interactions, and in-vivo role  plays Diagnosis Axis  none 300.02 (Generalized anxiety disorder) - Open - [Signifier: n/a]  Axis  none 296.32 (Major depressive affective disorder, recurrent episode, moderate) - Open -  [Signifier: n/a]  Signed by: Laroy Apple, PhD Version 2 Signed on: 2021-03-16 11:22:00 -0500 Generated on: 2022-12-07T15:59:08Z Gail Fuentes: Gail Fuentes DOB: 1946/01/14 Provider: Laroy Apple, PhD Created on: 2021-03-16 11:10:24 -[place] [address] [place], [place] [address] [phone] Conditions For Discharge Achievement of treatment goals and objective            Gail Cruise, PhD               Gail Cruise, PhD

## 2022-02-28 ENCOUNTER — Ambulatory Visit (INDEPENDENT_AMBULATORY_CARE_PROVIDER_SITE_OTHER): Payer: Medicare Other | Admitting: Clinical

## 2022-02-28 DIAGNOSIS — F411 Generalized anxiety disorder: Secondary | ICD-10-CM | POA: Diagnosis not present

## 2022-02-28 DIAGNOSIS — F331 Major depressive disorder, recurrent, moderate: Secondary | ICD-10-CM | POA: Diagnosis not present

## 2022-02-28 NOTE — Progress Notes (Signed)
Diagnosis: F33.2, Major Depression, Recurrent, Moderate,  F41.1 Time of session: 10:00 am-11:00 am CPT Code: 65465K (in person)  Gail Fuentes was seen in person for individual therapy. She reported that she has been working on "accepting things as they are," and expressed a desire to participate more actively in her faith. Therapist encouraged her to consider how she might go about this, and what it would look like in her life to practice acceptance. She is scheduled to be seen again on 11/14.  Gail Cruise, Gail Fuentes  Treatment Plan Client Abilities/Strengths  Gail Fuentes presented as motivated to engage in therapy and able to describe her challenges.  Client Treatment Preferences  Gail Fuentes prefers in-person appointments  Client Statement of Needs  Gail Fuentes is seeking cognitive behavioral therapy to manage symptoms of depression and anxiety.  Treatment Level  Biweekly  Symptoms  Anxiety: Excessive worry, sleep irregularity, difficulty relaxing (Status: maintained). Depression:  Depressed mood, lack of motivation, withdrawal from previously enjoyed activities, hopelessness  (Status: maintained).  Problems Addressed  New Description, New Description  Goals 1. Gail Fuentes has a history of recurrent depression Objective Gail Fuentes will increase self esteem and self compassion Target Date: 2022-01-16 Frequency: Daily Progress: 0 Modality: individual Objective Gail Fuentes will reduce anxious planning behavior and accept her personal limitations Target Date: 2022-01-16 Frequency: Biweekly Signed by: Laroy Apple, Gail Fuentes Version 2 Signed on: 2021-03-16 11:22:00 -0500 Generated on: 2022-12-07T15:59:08Z [name] DOB: Apr 24, 1946 Provider: Laroy Apple, Gail Fuentes Created on: 2021-03-16 11:10:24 -[place] [address] [place], [place] [address] [phone] Progress: 0 Modality: individual Objective Gail Fuentes will improve her overall mood and sense of  well-being Target Date: 2023-01-17 Frequency: Biweekly Progress: 0 Modality: individual Related Interventions 1. Therapist will provide Khadijatou with opportunities to process her experiences in session 2. Therapist will help Brenly to identify and disengage from maladaptive thought and behavior  patterns using CBT based strategies 3. Therapist will engage Jamaria in behavior activation, including incorporation of structure,  pleasant events, and mastery events into her routine 4. Therapist will provide referrals for additional resources as appropriate  2. Kruti has difficulty setting boundaries in relationships and  prioritizing self-care Objective Gail Fuentes will develop strategies to support healthy communication and boundary setting in  relationships Target Date: 2023-01-17 Frequency: Biweekly Progress: 0 Modality: individual Related Interventions 1. Therapist will provide Noya with communication strategies and opportunities to practice,  including writing down her thoughts, in-session processing of interactions, and in-vivo role  plays Diagnosis Axis  none 300.02 (Generalized anxiety disorder) - Open - [Signifier: n/a]  Axis  none 296.32 (Major depressive affective disorder, recurrent episode, moderate) - Open -  [Signifier: n/a]  Signed by: Laroy Apple, Gail Fuentes Version 2 Signed on: 2021-03-16 11:22:00 -0500 Generated on: 2022-12-07T15:59:08Z [name] DOB: November 05, 1945 Provider: Laroy Apple, Gail Fuentes Created on: 2021-03-16 11:10:24 -[place] [address] [place],  [address] [phone] Conditions For Discharge Achievement of treatment goals and objective            Gail Cruise, Gail Fuentes               Gail Cruise, Gail Fuentes               Gail Cruise, Gail Fuentes

## 2022-03-06 ENCOUNTER — Ambulatory Visit (HOSPITAL_BASED_OUTPATIENT_CLINIC_OR_DEPARTMENT_OTHER)
Admission: RE | Admit: 2022-03-06 | Discharge: 2022-03-06 | Disposition: A | Discharge status: Home / Self Care | Payer: Medicare Other | Source: Ambulatory Visit | Attending: Family Medicine | Admitting: Family Medicine

## 2022-03-06 DIAGNOSIS — I10 Essential (primary) hypertension: Secondary | ICD-10-CM | POA: Insufficient documentation

## 2022-03-06 DIAGNOSIS — E782 Mixed hyperlipidemia: Secondary | ICD-10-CM | POA: Insufficient documentation

## 2022-03-07 ENCOUNTER — Ambulatory Visit: Payer: Medicare Other | Admitting: Clinical

## 2022-03-07 ENCOUNTER — Encounter: Payer: Self-pay | Admitting: Family Medicine

## 2022-03-20 ENCOUNTER — Ambulatory Visit: Payer: Medicare Other | Admitting: Clinical

## 2022-04-19 ENCOUNTER — Ambulatory Visit (INDEPENDENT_AMBULATORY_CARE_PROVIDER_SITE_OTHER): Payer: Medicare Other | Admitting: Clinical

## 2022-04-19 DIAGNOSIS — F331 Major depressive disorder, recurrent, moderate: Secondary | ICD-10-CM | POA: Diagnosis not present

## 2022-04-19 DIAGNOSIS — F411 Generalized anxiety disorder: Secondary | ICD-10-CM | POA: Diagnosis not present

## 2022-04-19 NOTE — Progress Notes (Signed)
Diagnosis: F33.1, Major Depression, Recurrent, Moderate,  F41.1 Time of session: 1:00 pm-2:00 pm CPT Code: 03500X (in person)  Gail Fuentes was seen in person for individual therapy. She reported that she has been struggling to manage her usual holiday tasks, but also experiencing grief at the thought she may no longer be able to do what she has traditionally done at the holidays. Therapist provided validation and support, encouraging her to reflect on what a good holiday would look like for her in her current life situation. She is scheduled to be seen again in one month.  Gail Noa, PhD  Treatment Plan Gail Fuentes Abilities/Strengths  Gail Fuentes presented as motivated to engage in therapy and able to describe her challenges.  Gail Fuentes Treatment Preferences  Gail Fuentes prefers in-person appointments  Gail Fuentes Statement of Needs  Gail Fuentes is seeking cognitive behavioral therapy to manage symptoms of depression and anxiety.  Treatment Level  Biweekly  Symptoms  Anxiety: Excessive worry, sleep irregularity, difficulty relaxing (Status: maintained). Depression:  Depressed mood, lack of motivation, withdrawal from previously enjoyed activities, hopelessness  (Status: maintained).  Problems Addressed  New Description, New Description  Goals 1. Gail Fuentes has a history of recurrent depression Objective Gail Fuentes will increase self esteem and self compassion Target Date: 2022-01-16 Frequency: Daily Progress: 0 Modality: individual Objective Gail Fuentes will reduce anxious planning behavior and accept her personal limitations Target Date: 2022-01-16 Frequency: Biweekly Signed by: Charlyne Mom, PhD Version 2 Signed on: 2021-03-16 11:22:00 -0500 Generated on: 2022-12-07T15:59:08Z Gail Fuentes: Gail Fuentes DOB: November 17, 1945 Provider: Charlyne Mom, PhD Created on: 2021-03-16 11:10:24 -0500 [facility] [name] [place], [place] [address] [phone] Progress: 0 Modality:  individual Objective Gail Fuentes will improve her overall mood and sense of well-being Target Date: 2023-01-17 Frequency: Biweekly Progress: 0 Modality: individual Related Interventions 1. Therapist will provide Gail Fuentes with opportunities to process her experiences in session 2. Therapist will help Gail Fuentes to identify and disengage from maladaptive thought and behavior  patterns using CBT based strategies 3. Therapist will engage Gail Fuentes in behavior activation, including incorporation of structure,  pleasant events, and mastery events into her routine 4. Therapist will provide referrals for additional resources as appropriate  2. Marva has difficulty setting boundaries in relationships and  prioritizing self-care Objective Gail Fuentes will develop strategies to support healthy communication and boundary setting in  relationships Target Date: 2023-01-17 Frequency: Biweekly Progress: 0 Modality: individual Related Interventions 1. Therapist will provide Gail Fuentes with communication strategies and opportunities to practice,  including writing down her thoughts, in-session processing of interactions, and in-vivo role  plays Diagnosis Axis  none 300.02 (Generalized anxiety disorder) - Open - [Signifier: n/a]  Axis  none 296.32 (Major depressive affective disorder, recurrent episode, moderate) - Open -  [Signifier: n/a]  Signed by: Charlyne Mom, PhD Version 2 Signed on: 2021-03-16 11:22:00 -0500 Generated on: 2022-12-07T15:59:08Z Gail Fuentes: Gail Fuentes DOB: May 02, 1946 Provider: Charlyne Mom, PhD Created on: 2021-03-16 11:10:24 -0500 [facility] [name] [place], [place] [address] [phone] Conditions For Discharge Achievement of treatment goals and objective            Gail Noa, PhD                 Gail Noa, PhD               Gail Noa, PhD

## 2022-05-09 ENCOUNTER — Ambulatory Visit (INDEPENDENT_AMBULATORY_CARE_PROVIDER_SITE_OTHER): Payer: Medicare Other | Admitting: Clinical

## 2022-05-09 DIAGNOSIS — F331 Major depressive disorder, recurrent, moderate: Secondary | ICD-10-CM

## 2022-05-09 DIAGNOSIS — F411 Generalized anxiety disorder: Secondary | ICD-10-CM

## 2022-05-09 NOTE — Progress Notes (Signed)
Diagnosis: F33.1, Major Depression, Recurrent, Moderate,  F41.1 Time of session: 1:00 pm-2:00 pm CPT Code: 67619J (in person)  Gail Fuentes was seen in person for individual therapy. She reported periods of improvement in mood, interspersed with periods of difficulty with motivation and depressed mood. Therapist processed this with her, reflecting a theme of weighing whether to voice her boundaries and needs to others. Therapist engaged her in a discussion of how she might do this and what barriers stop her. She expressed a desire to speak up for herself more, and indicating a plan to do so for homework. She is scheduled to be seen again in three weeks, and therapist offered to reach out if an appointment becomes available sooner.   Treatment Plan Client Abilities/Strengths  Gail Fuentes presented as motivated to engage in therapy and able to describe her challenges.  Client Treatment Preferences  Gail Fuentes prefers in-person appointments  Client Statement of Needs  Gail Fuentes is seeking cognitive behavioral therapy to manage symptoms of depression and anxiety.  Treatment Level  Biweekly  Symptoms  Anxiety: Excessive worry, sleep irregularity, difficulty relaxing (Status: maintained). Depression:  Depressed mood, lack of motivation, withdrawal from previously enjoyed activities, hopelessness  (Status: maintained).  Problems Addressed  New Description, New Description  Goals 1. Gail Fuentes has a history of recurrent depression Objective Gail Fuentes will increase self esteem and self compassion Target Date: 2022-01-16 Frequency: Daily Progress: 0 Modality: individual Objective Gail Fuentes will reduce anxious planning behavior and accept her personal limitations Target Date: 2022-01-16 Frequency: Biweekly Signed by: Laroy Apple, PhD Version 2 Signed on: 2021-03-16 11:22:00 -0500 Generated on: 2022-12-07T15:59:08Z Gail Fuentes DOB: [birth year] Provider: Laroy Apple, PhD Created on:  2021-03-16 11:10:24 -[place] [address] [place], [place] [address] [phone] Progress: 0 Modality: individual Objective Roshelle will improve her overall mood and sense of well-being Target Date: 2023-01-17 Frequency: Biweekly Progress: 0 Modality: individual Related Interventions 1. Therapist will provide Gail Fuentes with opportunities to process her experiences in session 2. Therapist will help Gail Fuentes to identify and disengage from maladaptive thought and behavior  patterns using CBT based strategies 3. Therapist will engage Gail Fuentes in behavior activation, including incorporation of structure,  pleasant events, and mastery events into her routine 4. Therapist will provide referrals for additional resources as appropriate  2. Gail Fuentes has difficulty setting boundaries in relationships and  prioritizing self-care Objective Gail Fuentes will develop strategies to support healthy communication and boundary setting in  relationships Target Date: 2023-01-17 Frequency: Biweekly Progress: 0 Modality: individual Related Interventions 1. Therapist will provide Gail Fuentes with communication strategies and opportunities to practice,  including writing down her thoughts, in-session processing of interactions, and in-vivo role  plays Diagnosis Axis  none 300.02 (Generalized anxiety disorder) - Open - [Signifier: n/a]  Axis  none 296.32 (Major depressive affective disorder, recurrent episode, moderate) - Open -  [Signifier: n/a]  Signed by: Laroy Apple, PhD Version 2 Signed on: 2021-03-16 11:22:00 -0500 Generated on: 2022-12-07T15:59:08Z Gail Fuentes DOB: 05-28-1945 Provider: Laroy Apple, PhD Created on: 2021-03-16 11:10:24 -[place] [address] [place], [place] [address] [phone] Conditions For Discharge Achievement of treatment goals and objective      [name],  PhD               [name], PhD

## 2022-05-17 ENCOUNTER — Other Ambulatory Visit: Payer: Self-pay | Admitting: Family Medicine

## 2022-05-17 DIAGNOSIS — F339 Major depressive disorder, recurrent, unspecified: Secondary | ICD-10-CM

## 2022-05-26 ENCOUNTER — Other Ambulatory Visit: Payer: Self-pay | Admitting: Internal Medicine

## 2022-06-01 ENCOUNTER — Ambulatory Visit (INDEPENDENT_AMBULATORY_CARE_PROVIDER_SITE_OTHER): Payer: Medicare Other | Admitting: Clinical

## 2022-06-01 DIAGNOSIS — F331 Major depressive disorder, recurrent, moderate: Secondary | ICD-10-CM

## 2022-06-01 NOTE — Progress Notes (Signed)
  Diagnosis: F33.1, Major Depression, Recurrent, Moderate,  F41.1 Time of session: 1:00 pm-2:00 pm CPT Code: 36644I (in person)  Laquanta was seen in person for individual therapy. She reported periods of improvement in mood, interspersed with periods of difficulty with motivation and depressed mood. Therapist processed this with her, reflecting a theme of weighing whether to voice her boundaries and needs to others. Therapist provided an opportunity to process, suggesting communication strategies and action steps. She is scheduled to be seen again in one week.   Treatment Plan Client Abilities/Strengths  Dilia presented as motivated to engage in therapy and able to describe her challenges.  Client Treatment Preferences  Juliya prefers in-person appointments  Client Statement of Needs  [name] is seeking cognitive behavioral therapy to manage symptoms of depression and anxiety.  Treatment Level  Biweekly  Symptoms  Anxiety: Excessive worry, sleep irregularity, difficulty relaxing (Status: maintained). Depression:  Depressed mood, lack of motivation, withdrawal from previously enjoyed activities, hopelessness  (Status: maintained).  Problems Addressed  New Description, New Description  Goals 1. Charrie has a history of recurrent depression Objective Satina will increase self esteem and self compassion Target Date: 2023-01-17 Frequency: Daily Progress: 0 Modality: individual Objective Doreather will reduce anxious planning behavior and accept her personal limitations Target Date: 2023-01-17 Frequency: Biweekly Signed by: Laroy Apple, PhD Version 2 Signed on: 2021-03-16 11:22:00 -0500 Generated on: 2022-12-07T15:59:08Z Client: [name] DOB: 1945-09-22 Provider: Laroy Apple, PhD Created on: 2021-03-16 11:10:24 -[place] [address] [place], [place] [address] [phone] Progress: 0 Modality: individual Objective Alecia  will improve her overall mood and sense of well-being Target Date: 2023-01-17 Frequency: Biweekly Progress: 0 Modality: individual Related Interventions 1. Therapist will provide Kayleeann with opportunities to process her experiences in session 2. Therapist will help Dene to identify and disengage from maladaptive thought and behavior  patterns using CBT based strategies 3. Therapist will engage Irianna in behavior activation, including incorporation of structure,  pleasant events, and mastery events into her routine 4. Therapist will provide referrals for additional resources as appropriate  2. Lashya has difficulty setting boundaries in relationships and  prioritizing self-care Objective Keeleigh will develop strategies to support healthy communication and boundary setting in  relationships Target Date: 2023-01-17 Frequency: Biweekly Progress: 0 Modality: individual Related Interventions 1. Therapist will provide Shizue with communication strategies and opportunities to practice,  including writing down her thoughts, in-session processing of interactions, and in-vivo role  plays Diagnosis Axis  none 300.02 (Generalized anxiety disorder) - Open - [Signifier: n/a]  Axis  none 296.32 (Major depressive affective disorder, recurrent episode, moderate) - Open -  [Signifier: n/a]  Signed by: Laroy Apple, PhD Version 2 Signed on: 2021-03-16 11:22:00 -0500 Generated on: 2022-12-07T15:59:08Z Client: [name] DOB: [birth year] Provider: Laroy Apple, PhD Created on: 2021-03-16 11:10:24 -[place] [address] [place], [place] [address] [phone] Conditions For Discharge Achievement of treatment goals and objective         [name], PhD [name], PhD

## 2022-06-07 ENCOUNTER — Ambulatory Visit (INDEPENDENT_AMBULATORY_CARE_PROVIDER_SITE_OTHER): Payer: Medicare Other | Admitting: Clinical

## 2022-06-07 DIAGNOSIS — F411 Generalized anxiety disorder: Secondary | ICD-10-CM | POA: Diagnosis not present

## 2022-06-07 DIAGNOSIS — F331 Major depressive disorder, recurrent, moderate: Secondary | ICD-10-CM | POA: Diagnosis not present

## 2022-06-07 NOTE — Progress Notes (Signed)
  Diagnosis: F33.1, Major Depression, Recurrent, Moderate,  F41.1 Time of session: 11:00 am- 11:55 am CPT Code: 50932I (in person)  Gail Fuentes was seen in person for individual therapy. She reported having had a good week during which she had had several outings and seen family. Therapist queried whether incorporating more structure had helped her mood, and she agreed that this was possible. She shared an ongoing sense of wanting to know what is wrong with her, and therapist engaged her in discussion of this, querying whether this may be related to a near-constant feeling that something is wrong and the result of anxiety. She is scheduled to be seen again in two weeks.   Treatment Plan Client Abilities/Strengths  TRUE presented as motivated to engage in therapy and able to describe her challenges.  Client Treatment Preferences  Gail Fuentes prefers in-person appointments  Client Statement of Needs  Gail Fuentes is seeking cognitive behavioral therapy to manage symptoms of depression and anxiety.  Treatment Level  Biweekly  Symptoms  Anxiety: Excessive worry, sleep irregularity, difficulty relaxing (Status: maintained). Depression:  Depressed mood, lack of motivation, withdrawal from previously enjoyed activities, hopelessness  (Status: maintained).  Problems Addressed  New Description, New Description  Goals 1. Gail Fuentes has a history of recurrent depression Objective Gail Fuentes will increase self esteem and self compassion Target Date: 2023-01-17 Frequency: Daily Progress: 0 Modality: individual Objective Gail Fuentes will reduce anxious planning behavior and accept her personal limitations Target Date: 2023-01-17 Frequency: Biweekly Signed by: Laroy Apple, PhD Version 2 Signed on: 2021-03-16 11:22:00 -0500 Generated on: 2022-12-07T15:59:08Z [name] DOB: 1946-04-14 Provider: Laroy Apple, PhD Created on: 2021-03-16 11:10:24 -[place]  [address] [place], [place] [address] [phone] Progress: 0 Modality: individual Objective Gail Fuentes will improve her overall mood and sense of well-being Target Date: 2023-01-17 Frequency: Biweekly Progress: 0 Modality: individual Related Interventions 1. Therapist will provide Gail Fuentes with opportunities to process her experiences in session 2. Therapist will help Gail Fuentes to identify and disengage from maladaptive thought and behavior  patterns using CBT based strategies 3. Therapist will engage Gail Fuentes in behavior activation, including incorporation of structure,  pleasant events, and mastery events into her routine 4. Therapist will provide referrals for additional resources as appropriate  2. Gail Fuentes has difficulty setting boundaries in relationships and  prioritizing self-care Objective Gail Fuentes will develop strategies to support healthy communication and boundary setting in  relationships Target Date: 2023-01-17 Frequency: Biweekly Progress: 0 Modality: individual Related Interventions 1. Therapist will provide Gail Fuentes with communication strategies and opportunities to practice,  including writing down her thoughts, in-session processing of interactions, and in-vivo role  plays Diagnosis Axis  none 300.02 (Generalized anxiety disorder) - Open - [Signifier: n/a]  Axis  none 296.32 (Major depressive affective disorder, recurrent episode, moderate) - Open -  [Signifier: n/a]  Signed by: Laroy Apple, PhD Version 2 Signed on: 2021-03-16 11:22:00 -0500 Generated on: 2022-12-07T15:59:08Z [name] DOB: July 28, 1945 Provider: Laroy Apple, PhD Created on: 2021-03-16 11:10:24 -[place] [address] [place], [place] [address] [phone] Conditions For Discharge Achievement of treatment goals and objective         [name], PhD [name], PhD               [name],  PhD

## 2022-06-12 NOTE — Progress Notes (Unsigned)
Office Visit    Patient Name: Gail Fuentes Date of Encounter: 06/13/2022  PCP:  Darreld Mclean, MD   [place]  Cardiologist:  Lauree Chandler, MD  Advanced Practice Provider:  No care team member to display Electrophysiologist:  None    HPI    Gail Fuentes is a 77 y.o. female with a past medical history significant for HTN, HLD, GERD, anxiety, depression, hyperparathyroidism presents today for annual follow-up appointment.  Gail Fuentes was followed remotely in our office by Dr. Verl Blalock for abnormal EKG.  Echocardiogram 2011 with normal LV function, no valvular issues.  Gail Fuentes has been followed by Dr. Angelena Form since January 2018.  Gail Fuentes had reported occasional dyspnea.  EKG showed poor R wave progression in the precordial leads and possible inferior Q waves, all unchanged from 2009.  Echo February 2018 with normal LV systolic function, grade 1 DD, mild LVH, no valvular disease.  Gail Fuentes was seen August 04, 2021 by Dr. Angelena Form.  Gail Fuentes denies chest pain, dyspnea, palpitations, lower extremity edema, orthopnea, PND.  Occasional dizziness but no near syncope or syncope.  Repeat echocardiogram was ordered which was unremarkable.  It was thought that Gail Fuentes occasional dizziness was not due to Gail Fuentes heart.  Gail Fuentes had a CTA which revealed coronary calcium score of 12.7.  (35 percentile for age, race, and sex matched controls).  Recommend statin therapy for risk modification.  Today, Gail Fuentes states that Gail Fuentes is afraid of starting a statin because of side effects that friends have told Gail Fuentes.  Gail Fuentes recently started Fosamax a few months back and Gail Fuentes was resistant for a long time.  Last few weeks Gail Fuentes has felt like Gail Fuentes heart was racing especially when Gail Fuentes lays down at night.  Sometimes Gail Fuentes does nap during the day and gets this sensation.  Gail Fuentes has a lot of anxiety and with this comes palpitations.  Gail Fuentes takes Prozac 40 mg daily and sees a therapist.  Gail Fuentes does not think that therapy is helping.  Gail Fuentes  tried Wellbutrin in the past and did not tolerate it.  Gail Fuentes tells me Gail Fuentes has been on Prozac for years.  Gail Fuentes states the palpitations are infrequent and not very bothersome.  We discussed limiting caffeine and limiting alcohol.  Maintaining proper hydration.  Gail Fuentes states that Gail Fuentes has a glass of wine every other night but plans to cut it out after Valentine's Day since alcoholism runs in Gail Fuentes family.  All  Reports no shortness of breath nor dyspnea on exertion. Reports no chest pain, pressure, or tightness. No edema, orthopnea, PND.    Past Medical History    Past Medical History:  Diagnosis Date   Abnormal EKG    per patient- states evaluated by Dr Verl Blalock in the past   Allergy    sneezing  late Fall   Anemia 01/23/12   H/H 11.7/35   Anxiety state, unspecified    Arthritis    Benign paroxysmal positional vertigo    Depression    Depressive disorder, not elsewhere classified    Diverticulosis    Esophageal reflux    Esophageal stricture    Gallstones    Heart murmur    Hiatal hernia    History of hyperparathyroidism    History of UTI    HTN (hypertension)    Hyperlipidemia    OSA (obstructive sleep apnea) 05/27/2018   Other abnormal glucose    Personal history of other diseases of digestive system    12-15 since 1990, no  recurrence 2006   PONV (postoperative nausea and vomiting)    Unspecified hypothyroidism    Vitamin D deficiency    Past Surgical History:  Procedure Laterality Date   CATARACT EXTRACTION, BILATERAL Bilateral 12/06/2019   [facility]   CESAREAN SECTION  05/08/1975   CHOLECYSTECTOMY  05/07/2002   COLONOSCOPY  12/06/2003   [facility]   DENTAL SURGERY  1962, 2012   Age 83-Abscessed Teeth Extracted    PARATHYROIDECTOMY Left 02/13/2013   Procedure: LEFT INFERIOR PARATHYROIDECTOMY with frozen section ;  Surgeon: Earnstine Regal, MD;  Location: [facility];  Service: General;  Laterality: Left;   TOTAL ABDOMINAL HYSTERECTOMY W/ BILATERAL SALPINGOOPHORECTOMY  05/08/2003   Ovarian Cyst     Allergies  Allergies  Allergen Reactions   Augmentin [Amoxicillin-Pot Clavulanate] Rash     EKGs/Labs/Other Studies Reviewed:   The following studies were reviewed today:  08/2021 echocardiogram  IMPRESSIONS     1. Left ventricular ejection fraction, by estimation, is 60 to 65%. The  left ventricle has normal function. The left ventricle has no regional  wall motion abnormalities. Left ventricular diastolic parameters are  consistent with Grade I diastolic  dysfunction (impaired relaxation). Elevated left atrial pressure.   2. Right ventricular systolic function is normal. The right ventricular  size is mildly enlarged.   3. The mitral valve is normal in structure. Trivial mitral valve  regurgitation. No evidence of mitral stenosis.   4. The aortic valve has an indeterminant number of cusps. Aortic valve  regurgitation is not visualized. No aortic stenosis is present   Coronary calcium score 10/23 IMPRESSION: Coronary calcium score of 12.7. This was 46 percentile for age-, race-, and sex-matched controls.  EKG:  EKG is  ordered today.  The ekg ordered today demonstrates sinus bradycardia with premature atrial complexes, 54 bpm  Recent Labs: 02/05/2022: ALT 13; BUN 10; Creatinine, Ser 0.95; Hemoglobin 12.2; Platelets 313.0; Potassium 4.2; Sodium 136; TSH 0.59  Recent Lipid Panel    Component Value Date/Time   CHOL 236 (H) 02/05/2022 1348   CHOL 236 (H) 10/12/2014 1046   TRIG 106.0 02/05/2022 1348   TRIG 109 10/12/2014 1046   TRIG 91 05/13/2006 1031   HDL 77.90 02/05/2022 1348   HDL 90 10/12/2014 1046   CHOLHDL 3 02/05/2022 1348   VLDL 21.2 02/05/2022 1348   LDLCALC 137 (H) 02/05/2022 1348   LDLCALC 124 (H) 10/12/2014 1046   LDLDIRECT 142.7 12/27/2008 1035    Home Medications   Current Meds  Medication Sig   alendronate (FOSAMAX) 70 MG tablet Take 1 tablet (70 mg total) by mouth every 7 (seven) days. Take with a full glass of water on an empty stomach.    ALPRAZolam (XANAX) 0.5 MG tablet TAKE 0.5-1 TABLETS (0.25-0.5 MG TOTAL) BY MOUTH 2 (TWO) TIMES DAILY AS NEEDED FOR ANXIETY.   azelastine (ASTELIN) 0.1 % nasal spray Place 1 spray into both nostrils 2 (two) times daily. Use in each nostril as directed   Cholecalciferol (VITAMIN D3) 2000 UNITS TABS Take 2 tablets by mouth daily.    cyanocobalamin 100 MCG tablet Take 100 mcg by mouth daily.   ezetimibe (ZETIA) 10 MG tablet Take 1 tablet (10 mg total) by mouth daily.   FLUoxetine (PROZAC) 40 MG capsule Take 1 capsule (40 mg total) by mouth daily.   fluticasone (FLONASE) 50 MCG/ACT nasal spray Place 1 spray into both nostrils daily.   losartan (COZAAR) 50 MG tablet Take 1 tablet (50 mg total) by mouth daily.   pantoprazole (  PROTONIX) 40 MG tablet Take 1 tablet (40 mg total) by mouth daily.   SYNTHROID 75 MCG tablet TAKE 1 TABLET BY MOUTH EVERY DAY     Review of Systems      All other systems reviewed and are otherwise negative except as noted above.  Physical Exam    VS:  BP (!) 126/58   Pulse (!) 58   Ht 5\' 5"  (1.651 m)   Wt 190 lb 6.4 oz (86.4 kg)   SpO2 99%   BMI 31.68 kg/m  , BMI Body mass index is 31.68 kg/m.  Wt Readings from Last 3 Encounters:  06/13/22 190 lb 6.4 oz (86.4 kg)  02/05/22 191 lb 12.8 oz (87 kg)  01/16/22 188 lb 9.6 oz (85.5 kg)     GEN: Well nourished, well developed, in no acute distress. HEENT: normal. Neck: Supple, no JVD, carotid bruits, or masses. Cardiac: RRR, no murmurs, rubs, or gallops. No clubbing, cyanosis, edema.  Radials/PT 2+ and equal bilaterally.  Respiratory:  Respirations regular and unlabored, clear to auscultation bilaterally. GI: Soft, nontender, nondistended. MS: No deformity or atrophy. Skin: Warm and dry, no rash. Neuro:  Strength and sensation are intact. Psych: Normal affect.  Assessment & Plan    Hypertension -Well-controlled today 126/58 -Continue Cozaar 50 mg daily -Continue to monitor blood pressure at home -Maintain a  low-sodium diet  Palpitations -Infrequent and do not last long -Discussed limiting alcohol and caffeine -Maintain proper hydration -Heart rate too low to add a beta-blocker today        Disposition: Follow up 1 year with Lauree Chandler, MD or APP.  Signed, Elgie Collard, PA-C 06/13/2022, 4:46 PM [facility]

## 2022-06-13 ENCOUNTER — Ambulatory Visit (INDEPENDENT_AMBULATORY_CARE_PROVIDER_SITE_OTHER): Payer: Medicare Other | Admitting: Clinical

## 2022-06-13 ENCOUNTER — Ambulatory Visit: Payer: Medicare Other | Attending: Student | Admitting: Physician Assistant

## 2022-06-13 ENCOUNTER — Encounter: Payer: Self-pay | Admitting: Physician Assistant

## 2022-06-13 VITALS — BP 126/58 | HR 58 | Ht 65.0 in | Wt 190.4 lb

## 2022-06-13 DIAGNOSIS — R42 Dizziness and giddiness: Secondary | ICD-10-CM | POA: Diagnosis not present

## 2022-06-13 DIAGNOSIS — I10 Essential (primary) hypertension: Secondary | ICD-10-CM | POA: Diagnosis not present

## 2022-06-13 DIAGNOSIS — E785 Hyperlipidemia, unspecified: Secondary | ICD-10-CM

## 2022-06-13 DIAGNOSIS — F331 Major depressive disorder, recurrent, moderate: Secondary | ICD-10-CM

## 2022-06-13 DIAGNOSIS — F411 Generalized anxiety disorder: Secondary | ICD-10-CM

## 2022-06-13 DIAGNOSIS — Z79899 Other long term (current) drug therapy: Secondary | ICD-10-CM | POA: Diagnosis not present

## 2022-06-13 MED ORDER — EZETIMIBE 10 MG PO TABS: 10.0000 mg | ORAL_TABLET | Freq: Every day | ORAL | 3 refills | Status: DC

## 2022-06-13 NOTE — Progress Notes (Signed)
  Diagnosis: F33.1, Major Depression, Recurrent, Moderate,  F41.1 Time of session: 11:00 am- 11:55 am CPT Code: 63845X (in person)  Gail Fuentes was seen in person for individual therapy. She reported having had a good week during which she had had several outings and seen family. Session focused on worries she has for her family and their impact on her well-being. Therapist engaged her in a discussion of what she can and cannot control, and challenged the notion that caring for herself means caring less for her family. She queried whether she may have characteristics of ADHD, and therapist offered to complete a screener with her in her next appointment. She is scheduled to be seen again in two weeks.   Treatment Plan Client Abilities/Strengths  Gail Fuentes presented as motivated to engage in therapy and able to describe her challenges.  Client Treatment Preferences  Gail Fuentes prefers in-person appointments  Client Statement of Needs  Gail Fuentes is seeking cognitive behavioral therapy to manage symptoms of depression and anxiety.  Treatment Level  Biweekly  Symptoms  Anxiety: Excessive worry, sleep irregularity, difficulty relaxing (Status: maintained). Depression:  Depressed mood, lack of motivation, withdrawal from previously enjoyed activities, hopelessness  (Status: maintained).  Problems Addressed  New Description, New Description  Goals 1. Gail Fuentes has a history of recurrent depression Objective Gail Fuentes will increase self esteem and self compassion Target Date: 2023-01-17 Frequency: Daily Progress: 0 Modality: individual Objective Gail Fuentes will reduce anxious planning behavior and accept her personal limitations Target Date: 2023-01-17 Frequency: Biweekly Signed by: Laroy Apple, PhD Version 2 Signed on: 2021-03-16 11:22:00 -0500 Generated on: 2022-12-07T15:59:08Z [name] DOB: October 12, 1945 Provider: Laroy Apple, PhD Created on: 2021-03-16 11:10:24  -[place] [address] [place], [place] [address] [phone] Progress: 0 Modality: individual Objective Gail Fuentes will improve her overall mood and sense of well-being Target Date: 2023-01-17 Frequency: Biweekly Progress: 0 Modality: individual Related Interventions 1. Therapist will provide Gail Fuentes with opportunities to process her experiences in session 2. Therapist will help Gail Fuentes to identify and disengage from maladaptive thought and behavior  patterns using CBT based strategies 3. Therapist will engage Gail Fuentes in behavior activation, including incorporation of structure,  pleasant events, and mastery events into her routine 4. Therapist will provide referrals for additional resources as appropriate  2. Gail Fuentes has difficulty setting boundaries in relationships and  prioritizing self-care Objective Gail Fuentes will develop strategies to support healthy communication and boundary setting in  relationships Target Date: 2023-01-17 Frequency: Biweekly Progress: 0 Modality: individual Related Interventions 1. Therapist will provide Gail Fuentes with communication strategies and opportunities to practice,  including writing down her thoughts, in-session processing of interactions, and in-vivo role  plays Diagnosis Axis  none 300.02 (Generalized anxiety disorder) - Open - [Signifier: n/a]  Axis  none 296.32 (Major depressive affective disorder, recurrent episode, moderate) - Open -  [Signifier: n/a]  Signed by: Laroy Apple, PhD Version 2 Signed on: 2021-03-16 11:22:00 -0500 Generated on: 2022-12-07T15:59:08Z [name] DOB: 1945-12-23 Provider: Laroy Apple, PhD Created on: 2021-03-16 11:10:24 -[place] [address] [place], [place] [address] [phone] Conditions For Discharge Achievement of treatment goals and objective                [name],  PhD               [name], PhD

## 2022-06-13 NOTE — Patient Instructions (Signed)
Medication Instructions:  1.Start Zetia 10 mg daily *If you need a refill on your cardiac medications before your next appointment, please call your pharmacy*   Lab Work: Fasting lipid panel in 6 weeks If you have labs (blood work) drawn today and your tests are completely normal, you will receive your results only by: [place] (if you have MyChart) OR A paper copy in the mail If you have any lab test that is abnormal or we need to change your treatment, we will call you to review the results.  Follow-Up: At [facility], you and your health needs are our priority.  As part of our continuing mission to provide you with exceptional heart care, we have created designated Provider Care Teams.  These Care Teams include your primary Cardiologist (physician) and Advanced Practice Providers (APPs -  Physician Assistants and Nurse Practitioners) who all work together to provide you with the care you need, when you need it.   Your next appointment:   1 year(s)  Provider:   Lauree Chandler, MD   Other Instructions Limit your caffeine and alcohol  Aim for at least 64 az of water daily Follow up with primary care regarding your anxiety

## 2022-06-14 ENCOUNTER — Ambulatory Visit: Payer: Medicare Other | Admitting: Student

## 2022-07-20 ENCOUNTER — Ambulatory Visit (INDEPENDENT_AMBULATORY_CARE_PROVIDER_SITE_OTHER): Payer: Medicare Other | Admitting: Clinical

## 2022-07-20 DIAGNOSIS — F331 Major depressive disorder, recurrent, moderate: Secondary | ICD-10-CM

## 2022-07-20 DIAGNOSIS — F411 Generalized anxiety disorder: Secondary | ICD-10-CM | POA: Diagnosis not present

## 2022-07-20 NOTE — Progress Notes (Signed)
  Diagnosis: F33.1, Major Depression, Recurrent, Moderate,  F41.1 Time of session: 11:00 am- 11:55 am CPT Code: [id number] (in person)  Gail Fuentes was seen in person for individual therapy. She reported an improvement in overall mood since her last session, sharing that she has found it easier to notice and explore the sources of anxious responses, as well as to be easier on herself. Therapist engaged her in discussion of the possibility of a psychological evaluation, and she indicated a plan to think about it. She is scheduled to be seen again in two weeks.   Treatment Plan Client Abilities/Strengths  Gail Fuentes presented as motivated to engage in therapy and able to describe her challenges.  Client Treatment Preferences  Gail Fuentes prefers in-person appointments  Client Statement of Needs  Gail Fuentes is seeking cognitive behavioral therapy to manage symptoms of depression and anxiety.  Treatment Level  Biweekly  Symptoms  Anxiety: Excessive worry, sleep irregularity, difficulty relaxing (Status: maintained). Depression:  Depressed mood, lack of motivation, withdrawal from previously enjoyed activities, hopelessness  (Status: maintained).  Problems Addressed  New Description, New Description  Goals 1. Gail Fuentes has a history of recurrent depression Objective Gail Fuentes will increase self esteem and self compassion Target Date: 2023-01-17 Frequency: Daily Progress: 0 Modality: individual Objective Gail Fuentes will reduce anxious planning behavior and accept her personal limitations Target Date: 2023-01-17 Frequency: Biweekly Signed by: Gail Apple, PhD Version 2 Signed on: 2021-03-16 11:22:00 -0500 Generated on: 2022-12-07T15:59:08Z [name] DOB: [birth year] Provider: Laroy Apple, PhD Created on: 2021-03-16 11:10:24 -[place] [address] [place], [place] [address] [phone] Progress: 0 Modality: individual Objective [name] will improve  her overall mood and sense of well-being Target Date: 2023-01-17 Frequency: Biweekly Progress: 0 Modality: individual Related Interventions 1. Therapist will provide Kalaina with opportunities to process her experiences in session 2. Therapist will help Zahirah to identify and disengage from maladaptive thought and behavior  patterns using CBT based strategies 3. Therapist will engage Shely in behavior activation, including incorporation of structure,  pleasant events, and mastery events into her routine 4. Therapist will provide referrals for additional resources as appropriate  2. [name] has difficulty setting boundaries in relationships and  prioritizing self-care Objective [name] will develop strategies to support healthy communication and boundary setting in  relationships Target Date: 2023-01-17 Frequency: Biweekly Progress: 0 Modality: individual Related Interventions 1. Therapist will provide Rayan with communication strategies and opportunities to practice,  including writing down her thoughts, in-session processing of interactions, and in-vivo role  plays Diagnosis Axis  none 300.02 (Generalized anxiety disorder) - Open - [Signifier: n/a]  Axis  none 296.32 (Major depressive affective disorder, recurrent episode, moderate) - Open -  [Signifier: n/a]  Signed by: Gail Apple, PhD Version 2 Signed on: 2021-03-16 11:22:00 -0500 Generated on: 2022-12-07T15:59:08Z [name] DOB: 06-05-1945 Provider: Laroy Apple, PhD Created on: 2021-03-16 11:10:24 -[place] [address] [place], [place] [address] [phone] Conditions For Discharge Achievement of treatment goals and objective                [name], PhD               [name], PhD               [name], PhD

## 2022-07-25 ENCOUNTER — Ambulatory Visit: Payer: Medicare Other | Attending: Physician Assistant

## 2022-07-25 DIAGNOSIS — I10 Essential (primary) hypertension: Secondary | ICD-10-CM

## 2022-07-25 DIAGNOSIS — E785 Hyperlipidemia, unspecified: Secondary | ICD-10-CM

## 2022-07-25 DIAGNOSIS — R42 Dizziness and giddiness: Secondary | ICD-10-CM

## 2022-07-25 DIAGNOSIS — Z79899 Other long term (current) drug therapy: Secondary | ICD-10-CM

## 2022-07-26 LAB — LIPID PANEL
Chol/HDL Ratio: 2.4 ratio (ref 0.0–4.4)
Cholesterol, Total: 213 mg/dL — ABNORMAL HIGH (ref 100–199)
HDL: 89 mg/dL (ref >39)
LDL Chol Calc (NIH): 112 mg/dL — ABNORMAL HIGH (ref 0–99)
Triglycerides: 69 mg/dL (ref 0–149)
VLDL Cholesterol Cal: 12 mg/dL (ref 5–40)

## 2022-07-31 ENCOUNTER — Ambulatory Visit: Payer: Medicare Other | Admitting: Clinical

## 2022-08-01 ENCOUNTER — Ambulatory Visit (INDEPENDENT_AMBULATORY_CARE_PROVIDER_SITE_OTHER): Payer: Medicare Other | Admitting: Clinical

## 2022-08-01 DIAGNOSIS — F331 Major depressive disorder, recurrent, moderate: Secondary | ICD-10-CM | POA: Diagnosis not present

## 2022-08-01 NOTE — Progress Notes (Signed)
Diagnosis: F33.1, Major Depression, Recurrent, Moderate,  F41.1 Time of session: 10:00 am- 10:55 am CPT Code: [id number] (in person)  Gail Fuentes was seen in person for individual therapy. Session focused on discussing issues in her marriage. Therapist suggested books to look into and provided an opportunity to process. She is scheduled to be seen again in two weeks.   Treatment Plan Gail Fuentes  Gail Fuentes presented as motivated to engage in therapy and able to describe her challenges.  Gail Treatment Preferences  Gail Fuentes prefers in-person appointments  Gail Statement of Needs  Gail Fuentes is seeking cognitive behavioral therapy to manage symptoms of depression and anxiety.  Treatment Level  Biweekly  Symptoms  Anxiety: Excessive worry, sleep irregularity, difficulty relaxing (Status: maintained). Depression:  Depressed mood, lack of motivation, withdrawal from previously enjoyed activities, hopelessness  (Status: maintained).  Problems Addressed  New Description, New Description  Goals 1. Gail Fuentes has a history of recurrent depression Objective Gail Fuentes will increase self esteem and self compassion Target Date: 2023-01-17 Frequency: Daily Progress: 0 Modality: individual Objective Gail Fuentes will reduce anxious planning behavior and accept her personal limitations Target Date: 2023-01-17 Frequency: Biweekly Signed by: Laroy Apple, PhD Version 2 Signed on: 2021-03-16 11:22:00 -0500 Generated on: 2022-12-07T15:59:08Z Gail: [name] DOB: March 05, 1946 Provider: Laroy Apple, PhD Created on: 2021-03-16 11:10:24 -[place] [address] [place], [place] [address] [phone] Progress: 0 Modality: individual Objective [name] will improve her overall mood and sense of well-being Target Date: 2023-01-17 Frequency: Biweekly Progress: 0 Modality: individual Related Interventions 1. Therapist will provide [name] with  opportunities to process her experiences in session 2. Therapist will help [name] to identify and disengage from maladaptive thought and behavior  patterns using CBT based strategies 3. Therapist will engage [name] in behavior activation, including incorporation of structure,  pleasant events, and mastery events into her routine 4. Therapist will provide referrals for additional resources as appropriate  2. [name] has difficulty setting boundaries in relationships and  prioritizing self-care Objective [name] will develop strategies to support healthy communication and boundary setting in  relationships Target Date: 2023-01-17 Frequency: Biweekly Progress: 0 Modality: individual Related Interventions 1. Therapist will provide [name] with communication strategies and opportunities to practice,  including writing down her thoughts, in-session processing of interactions, and in-vivo role  plays Diagnosis Axis  none 300.02 (Generalized anxiety disorder) - Open - [Signifier: n/a]  Axis  none 296.32 (Major depressive affective disorder, recurrent episode, moderate) - Open -  [Signifier: n/a]  Signed by: Laroy Apple, PhD Version 2 Signed on: 2021-03-16 11:22:00 -0500 Generated on: 2022-12-07T15:59:08Z Gail: [name] DOB: [birth year] Provider: Laroy Apple, PhD Created on: 2021-03-16 11:10:24 -[place] [address] [place], [place] [address] [phone] Conditions For Discharge Achievement of treatment goals and objective            [name], PhD               [name], PhD

## 2022-08-20 ENCOUNTER — Encounter: Payer: Self-pay | Admitting: *Deleted

## 2022-08-22 ENCOUNTER — Other Ambulatory Visit: Payer: Self-pay | Admitting: Family Medicine

## 2022-08-22 DIAGNOSIS — I10 Essential (primary) hypertension: Secondary | ICD-10-CM

## 2022-08-31 ENCOUNTER — Ambulatory Visit: Payer: Medicare Other | Admitting: Clinical

## 2022-09-14 ENCOUNTER — Ambulatory Visit (INDEPENDENT_AMBULATORY_CARE_PROVIDER_SITE_OTHER): Payer: Medicare Other | Admitting: Clinical

## 2022-09-14 DIAGNOSIS — F411 Generalized anxiety disorder: Secondary | ICD-10-CM

## 2022-09-14 DIAGNOSIS — F331 Major depressive disorder, recurrent, moderate: Secondary | ICD-10-CM

## 2022-09-14 NOTE — Progress Notes (Signed)
Diagnosis: F33.1, Major Depression, Recurrent, Moderate,  F41.1 Time of session: 11:00 am- 11:55 am CPT Code: 16109U (in person)  Jeana was seen in person for individual therapy. Session focused on her feeling of being of burdened with other people's problems and stressors. Therapist encouraged her to consider and communicate what she would want, and explored with her how to do so across several situations. She is scheduled to be seen again in two weeks.   Treatment Plan Client Abilities/Strengths  Nai presented as motivated to engage in therapy and able to describe her challenges.  Client Treatment Preferences  Brylan prefers in-person appointments  Client Statement of Needs  Amiera is seeking cognitive behavioral therapy to manage symptoms of depression and anxiety.  Treatment Level  Biweekly  Symptoms  Anxiety: Excessive worry, sleep irregularity, difficulty relaxing (Status: maintained). Depression:  Depressed mood, lack of motivation, withdrawal from previously enjoyed activities, hopelessness  (Status: maintained).  Problems Addressed  New Description, New Description  Goals 1. Sonakshi has a history of recurrent depression Objective Hassie will increase self esteem and self compassion Target Date: 2023-01-17 Frequency: Daily Progress: 0 Modality: individual Objective Mavi will reduce anxious planning behavior and accept her personal limitations Target Date: 2023-01-17 Frequency: Biweekly Signed by: Charlyne Mom, PhD Version 2 Signed on: 2021-03-16 11:22:00 -0500 Generated on: 2022-12-07T15:59:08Z Client: Arvilla Market DOB: 1945/10/18 Provider: Charlyne Mom, PhD Created on: 2021-03-16 11:10:24 -0500 [facility] [name] [place], [place] [address] [phone] Progress: 0 Modality: individual Objective Nastashia will improve her overall mood and sense of well-being Target Date: 2023-01-17 Frequency: Biweekly Progress:  0 Modality: individual Related Interventions 1. Therapist will provide Haizlee with opportunities to process her experiences in session 2. Therapist will help Jalyric to identify and disengage from maladaptive thought and behavior  patterns using CBT based strategies 3. Therapist will engage Glorian in behavior activation, including incorporation of structure,  pleasant events, and mastery events into her routine 4. Therapist will provide referrals for additional resources as appropriate  2. Keira has difficulty setting boundaries in relationships and  prioritizing self-care Objective Raphaelle will develop strategies to support healthy communication and boundary setting in  relationships Target Date: 2023-01-17 Frequency: Biweekly Progress: 0 Modality: individual Related Interventions 1. Therapist will provide Nycole with communication strategies and opportunities to practice,  including writing down her thoughts, in-session processing of interactions, and in-vivo role  plays Diagnosis Axis  none 300.02 (Generalized anxiety disorder) - Open - [Signifier: n/a]  Axis  none 296.32 (Major depressive affective disorder, recurrent episode, moderate) - Open -  [Signifier: n/a]  Signed by: Charlyne Mom, PhD Version 2 Signed on: 2021-03-16 11:22:00 -0500 Generated on: 2022-12-07T15:59:08Z Client: Arvilla Market DOB: 03/09/1946 Provider: Charlyne Mom, PhD Created on: 2021-03-16 11:10:24 -0500 [facility] [name] [place], [place] [address] [phone] Conditions For Discharge Achievement of treatment goals and objective    [name], PhD               [name], PhD

## 2022-09-25 ENCOUNTER — Ambulatory Visit (INDEPENDENT_AMBULATORY_CARE_PROVIDER_SITE_OTHER): Payer: Medicare Other | Admitting: Clinical

## 2022-09-25 DIAGNOSIS — F331 Major depressive disorder, recurrent, moderate: Secondary | ICD-10-CM | POA: Diagnosis not present

## 2022-09-25 NOTE — Progress Notes (Signed)
Diagnosis: F33.1, Major Depression, Recurrent, Moderate,  F41.1 Time of session: 10:03 am- 11:00 am CPT Code: 16109U (in person)  Sakhia was seen in person for individual therapy. She had had a good mother's day, but continues to struggle with feelings that others are not there for her, coupled with difficulty releasing stress over situations she is unable to control. Therapist suggested exploring the work of [employer], and encouraged her to remember that she is not her thoughts. She is scheduled to be seen again in two weeks.   Treatment Plan Client Abilities/Strengths  Levaeh presented as motivated to engage in therapy and able to describe her challenges.  Client Treatment Preferences  Koni prefers in-person appointments  Client Statement of Needs  Clarabelle is seeking cognitive behavioral therapy to manage symptoms of depression and anxiety.  Treatment Level  Biweekly  Symptoms  Anxiety: Excessive worry, sleep irregularity, difficulty relaxing (Status: maintained). Depression:  Depressed mood, lack of motivation, withdrawal from previously enjoyed activities, hopelessness  (Status: maintained).  Problems Addressed  New Description, New Description  Goals 1. Vonya has a history of recurrent depression Objective Delphine will increase self esteem and self compassion Target Date: 2023-01-17 Frequency: Daily Progress: 0 Modality: individual Objective Klohe will reduce anxious planning behavior and accept her personal limitations Target Date: 2023-01-17 Frequency: Biweekly Signed by: Charlyne Mom, PhD Version 2 Signed on: 2021-03-16 11:22:00 -0500 Generated on: 2022-12-07T15:59:08Z [name] DOB: 09-Jan-1946 Provider: Charlyne Mom, PhD Created on: 2021-03-16 11:10:24 -0500 [facility] [name] [place], [place] [address] [phone] Progress: 0 Modality: individual Objective Malajah will improve her overall mood and  sense of well-being Target Date: 2023-01-17 Frequency: Biweekly Progress: 0 Modality: individual Related Interventions 1. Therapist will provide Gurneet with opportunities to process her experiences in session 2. Therapist will help Nebula to identify and disengage from maladaptive thought and behavior  patterns using CBT based strategies 3. Therapist will engage Aries in behavior activation, including incorporation of structure,  pleasant events, and mastery events into her routine 4. Therapist will provide referrals for additional resources as appropriate  2. Bristen has difficulty setting boundaries in relationships and  prioritizing self-care Objective Alinna will develop strategies to support healthy communication and boundary setting in  relationships Target Date: 2023-01-17 Frequency: Biweekly Progress: 0 Modality: individual Related Interventions 1. Therapist will provide Carnell with communication strategies and opportunities to practice,  including writing down her thoughts, in-session processing of interactions, and in-vivo role  plays Diagnosis Axis  none 300.02 (Generalized anxiety disorder) - Open - [Signifier: n/a]  Axis  none 296.32 (Major depressive affective disorder, recurrent episode, moderate) - Open -  [Signifier: n/a]  Signed by: Charlyne Mom, PhD Version 2 Signed on: 2021-03-16 11:22:00 -0500 Generated on: 2022-12-07T15:59:08Z [name] DOB: 01-25-1946 Provider: Charlyne Mom, PhD Created on: 2021-03-16 11:10:24 -0500 [facility] [name] [place], [place] [address] [phone] Conditions For Discharge Achievement of treatment goals and objective   [name], PhD               [name], PhD

## 2022-10-10 ENCOUNTER — Ambulatory Visit (INDEPENDENT_AMBULATORY_CARE_PROVIDER_SITE_OTHER): Payer: Medicare Other | Admitting: Clinical

## 2022-10-10 DIAGNOSIS — F411 Generalized anxiety disorder: Secondary | ICD-10-CM

## 2022-10-10 DIAGNOSIS — F331 Major depressive disorder, recurrent, moderate: Secondary | ICD-10-CM

## 2022-10-10 NOTE — Progress Notes (Signed)
Diagnosis: F33.1, Major Depression, Recurrent, Moderate,  F41.1 Time of session: 10:03 am- 11:00 am CPT Code: 16109U (in person)  Tanishia was seen in person for individual therapy. She reported a difficult few weeks during which her stress had continued to increase as a result of being involved in family stressors. Therapist engaged her in discussion of healthy boundary setting across several situations. For homework, she will return read to report at least once instance of saying no to others and yes to herself. She is scheduled to be seen again in two weeks.   Treatment Plan Client Abilities/Strengths  Pleshette presented as motivated to engage in therapy and able to describe her challenges.  Client Treatment Preferences  Martavia prefers in-person appointments  Client Statement of Needs  Maisy is seeking cognitive behavioral therapy to manage symptoms of depression and anxiety.  Treatment Level  Biweekly  Symptoms  Anxiety: Excessive worry, sleep irregularity, difficulty relaxing (Status: maintained). Depression:  Depressed mood, lack of motivation, withdrawal from previously enjoyed activities, hopelessness  (Status: maintained).  Problems Addressed  New Description, New Description  Goals 1. Mykah has a history of recurrent depression Objective Berea will increase self esteem and self compassion Target Date: 2023-01-17 Frequency: Daily Progress: 0 Modality: individual Objective Sushila will reduce anxious planning behavior and accept her personal limitations Target Date: 2023-01-17 Frequency: Biweekly Signed by: Charlyne Mom, PhD Version 2 Signed on: 2021-03-16 11:22:00 -0500 Generated on: 2022-12-07T15:59:08Z [name] DOB: December 13, 1945 Provider: Charlyne Mom, PhD Created on: 2021-03-16 11:10:24 -0500 [facility] [name] [place], [place] [address] [phone] Progress: 0 Modality: individual Objective Sanjuana  will improve her overall mood and sense of well-being Target Date: 2023-01-17 Frequency: Biweekly Progress: 0 Modality: individual Related Interventions 1. Therapist will provide Caydince with opportunities to process her experiences in session 2. Therapist will help Jalen to identify and disengage from maladaptive thought and behavior  patterns using CBT based strategies 3. Therapist will engage Teriah in behavior activation, including incorporation of structure,  pleasant events, and mastery events into her routine 4. Therapist will provide referrals for additional resources as appropriate  2. Melody has difficulty setting boundaries in relationships and  prioritizing self-care Objective Armie will develop strategies to support healthy communication and boundary setting in  relationships Target Date: 2023-01-17 Frequency: Biweekly Progress: 0 Modality: individual Related Interventions 1. Therapist will provide Raeghan with communication strategies and opportunities to practice,  including writing down her thoughts, in-session processing of interactions, and in-vivo role  plays Diagnosis Axis  none 300.02 (Generalized anxiety disorder) - Open - [Signifier: n/a]  Axis  none 296.32 (Major depressive affective disorder, recurrent episode, moderate) - Open -  [Signifier: n/a]  Signed by: Charlyne Mom, PhD Version 2 Signed on: 2021-03-16 11:22:00 -0500 Generated on: 2022-12-07T15:59:08Z [name] DOB: [birth year] Provider: Charlyne Mom, PhD Created on: 2021-03-16 11:10:24 -0500 [facility] [name] [place], [place] [address] [phone] Conditions For Discharge Achievement of treatment goals and objective   [name], PhD               [name], PhD               [name], PhD

## 2022-10-16 ENCOUNTER — Telehealth: Payer: Self-pay | Admitting: Family Medicine

## 2022-10-16 DIAGNOSIS — K219 Gastro-esophageal reflux disease without esophagitis: Secondary | ICD-10-CM

## 2022-10-16 NOTE — Telephone Encounter (Signed)
PA initiated via Covermymeds; KEY: [id number]. Awaiting determination.

## 2022-10-17 ENCOUNTER — Encounter: Payer: Self-pay | Admitting: Family Medicine

## 2022-10-17 NOTE — Telephone Encounter (Signed)
PA denied.   We're writing in response to your inquiry on 10/16/2022, asking if Pantoprazole Tab 40mg  was covered by your plan. Unfortunately, Pantoprazole Tab 40mg  is not covered by your plan benefits. Your Medicare Advantage ([place]) plan's drug coverage is limited to only the drugs covered under your Part B medical benefit. This plan does not include Part D prescription drug coverage. Please refer to your Evidence of Coverage (EOC) for more information

## 2022-10-19 ENCOUNTER — Other Ambulatory Visit: Payer: Self-pay | Admitting: Family Medicine

## 2022-10-19 DIAGNOSIS — K219 Gastro-esophageal reflux disease without esophagitis: Secondary | ICD-10-CM

## 2022-10-19 NOTE — Telephone Encounter (Signed)
Patient called to inquire about this prescription and was notified of pa denial and provider's suggestions.

## 2022-10-24 ENCOUNTER — Ambulatory Visit (INDEPENDENT_AMBULATORY_CARE_PROVIDER_SITE_OTHER): Payer: Medicare Other | Admitting: Clinical

## 2022-10-24 DIAGNOSIS — F331 Major depressive disorder, recurrent, moderate: Secondary | ICD-10-CM

## 2022-10-24 DIAGNOSIS — F411 Generalized anxiety disorder: Secondary | ICD-10-CM | POA: Diagnosis not present

## 2022-10-24 NOTE — Progress Notes (Signed)
Diagnosis: F33.1, Major Depression, Recurrent, Moderate,  F41.1 Time of session: 9:03 am- 9:56 am CPT Code: 08657Q (in person)  Gail Fuentes was seen in person for individual therapy. She had completed her homework of saying "no" at least once, and also shared several situations in which she had made executive decisions. She reported an improvement in mood, but also a fear of being perceived as pushy. Therapist pointed out the double bind of feeling responsible for much of her family's challenges, but also pushy when she takes action. Session focused on processing this, including strategies to address it (such as asking others, would you like for me to make a decision about this for [name]?) She is scheduled to be seen again in two weeks.   Treatment Plan Client Abilities/Strengths  Gail Fuentes presented as motivated to engage in therapy and able to describe her challenges.  Client Treatment Preferences  Gail Fuentes prefers in-person appointments  Client Statement of Needs  Gail Fuentes is seeking cognitive behavioral therapy to manage symptoms of depression and anxiety.  Treatment Level  Biweekly  Symptoms  Anxiety: Excessive worry, sleep irregularity, difficulty relaxing (Status: maintained). Depression:  Depressed mood, lack of motivation, withdrawal from previously enjoyed activities, hopelessness  (Status: maintained).  Problems Addressed  New Description, New Description  Goals 1. Gail Fuentes has a history of recurrent depression Objective Gail Fuentes will increase self esteem and self compassion Target Date: 2023-01-17 Frequency: Daily Progress: 0 Modality: individual Objective Gail Fuentes will reduce anxious planning behavior and accept her personal limitations Target Date: 2023-01-17 Frequency: Biweekly Signed by: Charlyne Mom, PhD Version 2 Signed on: 2021-03-16 11:22:00 -0500 Generated on: 2022-12-07T15:59:08Z [name] DOB: 07/08/1945 Provider: Charlyne Mom, PhD Created on:  2021-03-16 11:10:24 -0500 [facility] [name] [place], [place] [address] [phone] Progress: 0 Modality: individual Objective Gail Fuentes will improve her overall mood and sense of well-being Target Date: 2023-01-17 Frequency: Biweekly Progress: 0 Modality: individual Related Interventions 1. Therapist will provide Gail Fuentes with opportunities to process her experiences in session 2. Therapist will help Gail Fuentes to identify and disengage from maladaptive thought and behavior  patterns using CBT based strategies 3. Therapist will engage Gail Fuentes in behavior activation, including incorporation of structure,  pleasant events, and mastery events into her routine 4. Therapist will provide referrals for additional resources as appropriate  2. Gail Fuentes has difficulty setting boundaries in relationships and  prioritizing self-care Objective Gail Fuentes will develop strategies to support healthy communication and boundary setting in  relationships Target Date: 2023-01-17 Frequency: Biweekly Progress: 0 Modality: individual Related Interventions 1. Therapist will provide Gail Fuentes with communication strategies and opportunities to practice,  including writing down her thoughts, in-session processing of interactions, and in-vivo role  plays Diagnosis Axis  none 300.02 (Generalized anxiety disorder) - Open - [Signifier: n/a]  Axis  none 296.32 (Major depressive affective disorder, recurrent episode, moderate) - Open -  [Signifier: n/a]  Signed by: Charlyne Mom, PhD Version 2 Signed on: 2021-03-16 11:22:00 -0500 Generated on: 2022-12-07T15:59:08Z [name] DOB: 1945-12-04 Provider: Charlyne Mom, PhD Created on: 2021-03-16 11:10:24 -0500 [facility] [name] [place], [place] [address] [phone] Conditions For Discharge Achievement of treatment goals and objective     [name],  PhD               [name], PhD

## 2022-10-25 ENCOUNTER — Other Ambulatory Visit: Payer: Self-pay | Admitting: Family Medicine

## 2022-10-25 DIAGNOSIS — F339 Major depressive disorder, recurrent, unspecified: Secondary | ICD-10-CM

## 2022-11-05 ENCOUNTER — Other Ambulatory Visit: Payer: Self-pay | Admitting: Internal Medicine

## 2022-11-16 ENCOUNTER — Ambulatory Visit (INDEPENDENT_AMBULATORY_CARE_PROVIDER_SITE_OTHER): Payer: Medicare Other | Admitting: Clinical

## 2022-11-16 DIAGNOSIS — F330 Major depressive disorder, recurrent, mild: Secondary | ICD-10-CM | POA: Diagnosis not present

## 2022-11-16 NOTE — Progress Notes (Signed)
Diagnosis: F33.1, Major Depression, Recurrent, Moderate,  F41.1 Time of session: 12:03 pm- 12:58 pm CPT Code: 16109U (in person)  Chani was seen in person for individual therapy. She reported overall improvement in mood since her last session, which she attributed to continuing to allow herself to say no to some things, reducing intake of news, and attending church. She reflected upon recent developments in her life. For homework, she will continue practicing strategies that have helped to improve her mood. She is scheduled to be seen again in two weeks.   Treatment Plan Client Abilities/Strengths  Omolara presented as motivated to engage in therapy and able to describe her challenges.  Client Treatment Preferences  Treca prefers in-person appointments  Client Statement of Needs  Mirel is seeking cognitive behavioral therapy to manage symptoms of depression and anxiety.  Treatment Level  Biweekly  Symptoms  Anxiety: Excessive worry, sleep irregularity, difficulty relaxing (Status: maintained). Depression:  Depressed mood, lack of motivation, withdrawal from previously enjoyed activities, hopelessness  (Status: maintained).  Problems Addressed  New Description, New Description  Goals 1. Princes has a history of recurrent depression Objective Joyous will increase self esteem and self compassion Target Date: 2023-01-17 Frequency: Daily Progress: 0 Modality: individual Objective Carlethia will reduce anxious planning behavior and accept her personal limitations Target Date: 2023-01-17 Frequency: Biweekly Signed by: Charlyne Mom, PhD Version 2 Signed on: 2021-03-16 11:22:00 -0500 Generated on: 2022-12-07T15:59:08Z Client: Arvilla Market DOB: 05-Mar-1946 Provider: Charlyne Mom, PhD Created on: 2021-03-16 11:10:24 -0500 [facility] [name] [place], [place] [address] [phone] Progress: 0 Modality: individual Objective Livana will  improve her overall mood and sense of well-being Target Date: 2023-01-17 Frequency: Biweekly Progress: 0 Modality: individual Related Interventions 1. Therapist will provide Sharonna with opportunities to process her experiences in session 2. Therapist will help Greysen to identify and disengage from maladaptive thought and behavior  patterns using CBT based strategies 3. Therapist will engage Bernarda in behavior activation, including incorporation of structure,  pleasant events, and mastery events into her routine 4. Therapist will provide referrals for additional resources as appropriate  2. Carnell has difficulty setting boundaries in relationships and  prioritizing self-care Objective Miley will develop strategies to support healthy communication and boundary setting in  relationships Target Date: 2023-01-17 Frequency: Biweekly Progress: 0 Modality: individual Related Interventions 1. Therapist will provide Laurn with communication strategies and opportunities to practice,  including writing down her thoughts, in-session processing of interactions, and in-vivo role  plays Diagnosis Axis  none 300.02 (Generalized anxiety disorder) - Open - [Signifier: n/a]  Axis  none 296.32 (Major depressive affective disorder, recurrent episode, moderate) - Open -  [Signifier: n/a]  Signed by: Charlyne Mom, PhD Version 2 Signed on: 2021-03-16 11:22:00 -0500 Generated on: 2022-12-07T15:59:08Z Client: Arvilla Market DOB: Oct 12, 1945 Provider: Charlyne Mom, PhD Created on: 2021-03-16 11:10:24 -0500 [facility] [name] [place], [place] [address] [phone] Conditions For Discharge Achievement of treatment goals and objective     [name], PhD               [name], PhD               [name], PhD

## 2022-11-21 ENCOUNTER — Other Ambulatory Visit: Payer: Self-pay | Admitting: Internal Medicine

## 2022-11-22 ENCOUNTER — Other Ambulatory Visit: Payer: Self-pay | Admitting: Family Medicine

## 2022-11-22 DIAGNOSIS — F339 Major depressive disorder, recurrent, unspecified: Secondary | ICD-10-CM

## 2022-11-26 ENCOUNTER — Other Ambulatory Visit: Payer: Self-pay | Admitting: Family Medicine

## 2022-11-26 DIAGNOSIS — F339 Major depressive disorder, recurrent, unspecified: Secondary | ICD-10-CM

## 2022-11-27 ENCOUNTER — Encounter: Payer: Self-pay | Admitting: Internal Medicine

## 2022-11-27 ENCOUNTER — Ambulatory Visit (INDEPENDENT_AMBULATORY_CARE_PROVIDER_SITE_OTHER): Payer: Medicare Other | Admitting: Internal Medicine

## 2022-11-27 VITALS — BP 132/64 | HR 62 | Ht 65.0 in | Wt 192.2 lb

## 2022-11-27 DIAGNOSIS — E041 Nontoxic single thyroid nodule: Secondary | ICD-10-CM

## 2022-11-27 DIAGNOSIS — E559 Vitamin D deficiency, unspecified: Secondary | ICD-10-CM | POA: Diagnosis not present

## 2022-11-27 DIAGNOSIS — M8080XD Other osteoporosis with current pathological fracture, unspecified site, subsequent encounter for fracture with routine healing: Secondary | ICD-10-CM

## 2022-11-27 DIAGNOSIS — M810 Age-related osteoporosis without current pathological fracture: Secondary | ICD-10-CM | POA: Diagnosis not present

## 2022-11-27 DIAGNOSIS — E039 Hypothyroidism, unspecified: Secondary | ICD-10-CM

## 2022-11-27 DIAGNOSIS — Z8639 Personal history of other endocrine, nutritional and metabolic disease: Secondary | ICD-10-CM | POA: Diagnosis not present

## 2022-11-27 LAB — VITAMIN D 25 HYDROXY (VIT D DEFICIENCY, FRACTURES): VITD: 66.45 ng/mL (ref 30.00–100.00)

## 2022-11-27 LAB — TSH: TSH: 0.89 u[IU]/mL (ref 0.35–5.50)

## 2022-11-27 LAB — T4, FREE: Free T4: 0.99 ng/dL (ref 0.60–1.60)

## 2022-11-27 MED ORDER — LEVOTHYROXINE SODIUM 75 MCG PO TABS: 75.0000 ug | ORAL_TABLET | Freq: Every day | ORAL | 3 refills | Status: DC

## 2022-11-27 NOTE — Progress Notes (Addendum)
Subjective:     Patient ID: Gail Fuentes, female   DOB: 1945-07-17, 77 y.o.   MRN: [id number]  HPI Gail Fuentes is a pleasant 77 y/o returning for f/u for history of hyperparathyroidism - s/p parathyroidectomy, hypothyroidism, and a R thyroid nodule. Last visit 1 year ago.  Interim history: No falls or fractures since last OV. No dizziness, vertigo, vision problems.  She has more fatigue recently.  She is usually very active in her garden.  No formal exercise.  Reviewed history: Pt had a thyroid ultrasound (10/08/2012) showing only a right thyroid nodule, 7 x 7 x 7 mm, with internal calcifications. On a subsequent thyroid U/S >> this nodule has disappeared.  A technetium sestamibi scan showed a left inferior parathyroid adenoma and a CT scan confirmed this. However, during surgery, it was found that she had a left superior parathyroid adenoma, which was excised - left superior parathyroidectomy with Dr Gerrit Friends in 02/2013.   She feels much better after surgery.  Her PTH and calcium levels normalized after surgery: Lab Results  Component Value Date   PTH 35 11/15/2015   PTH Comment 11/15/2015   PTH 41 11/15/2014   PTH Comment 11/15/2014   PTH 44 03/16/2014   PTH Comment 03/16/2014   CALCIUM 9.2 02/05/2022   CALCIUM 9.6 01/25/2021   CALCIUM 9.0 11/22/2020   CALCIUM 9.9 12/10/2019   CALCIUM 9.5 09/16/2019   CALCIUM 9.4 11/20/2018   CALCIUM 9.7 03/06/2018   CALCIUM 9.3 11/15/2017   CALCIUM 9.8 02/13/2017   CALCIUM 9.3 11/15/2016   CALCIUM 9.7 11/15/2015   CALCIUM 9.5 07/18/2015   CALCIUM 9.5 11/15/2014   CALCIUM 9.4 09/24/2014   CALCIUM 8.8 03/16/2014   No history of kidney stones but had an osteoporotic T score at the ultra distal radius per DXA scan in 2014.   + h/o R Styloid radial fracture 10/2018.  DXA scan ([facility]) 12/05/2020 - osteoporosis: - L1-L4: -2.3 (+2.9%) - RFN: -1.9 - LFN: -2.5 (-5.3%* for both FN) -33% distal radius: -2.1 (-2.4%)  DXA  scan ([facility]) 12/03/2018 - osteoporosis: - L1-L4: -2.5 (-3.7%*) - RFN: -2.0 - LFN: -1.9 (-1.3%* for both FN) -33% distal radius: -1.9 (-4.4%)  DXA scan ([facility]) 11/28/2016 - osteopenia: - L1-L4: -2.2 - RFN: -1.1 - LFN: -1.3 FRAX: Major osteoporotic fracture risk over 10 years: 13.7; hip fracture risk over 10 years: 2.1. But unfortunately appendicular skeleton was not checked.  I suggested Prolia after the 2020 bone density returned but she was afraid of side effects and did not start.  I then advised her to review information about Fosamax or Reclast, but she declined these, also.   However, we ended up starting Fosamax 11/2021.  She has FH of M - many fractures.  H/o vit D def  Previously on ergocalciferol, now on vitamin D 4000 units daily.  Reviewed vitamin D levels: Lab Results  Component Value Date   VD25OH 60.13 11/22/2021   VD25OH 65.97 11/22/2020   VD25OH 72.94 09/16/2019   VD25OH 62.76 11/20/2018   VD25OH 25.82 (L) 11/15/2017   VD25OH 43.09 11/15/2016   VD25OH 45.13 11/15/2015   VD25OH 34.16 07/18/2015   VD25OH 30.52 10/12/2014   VD25OH 19.76 (L) 03/16/2014   Hypothyroidism.  -Longstanding  Pt is on Levothyroxine 75 mcg daily (prev. 100 mcg 5/7 days and 50 mcg 2/7 days), taken: - in am - coffee + creamer - >30 min later - fasting - at least 30-60 min  from b'fast - no Ca, Fe, MVI, + PPIs later in the day (>4h later) - Prilosec OTC - not on Biotin; on B12  Reviewed TFTs: Lab Results  Component Value Date   TSH 0.59 02/05/2022   TSH 0.64 01/12/2022   TSH 0.14 (L) 11/22/2021   TSH 0.45 01/03/2021   TSH 0.15 (L) 11/22/2020   FREET4 0.86 01/12/2022   FREET4 1.18 11/22/2021   FREET4 0.99 01/03/2021   FREET4 1.31 11/22/2020   FREET4 1.04 11/20/2018   Pt denies: - feeling nodules in neck - hoarseness - dysphagia - choking  Review of Systems + see HPI  I reviewed pt's medications, allergies, PMH, social hx, family hx,  and changes were documented in the history of present illness. Otherwise, unchanged from my initial visit note.  Past Medical History:  Diagnosis Date   Abnormal EKG    per patient- states evaluated by Dr Daleen Squibb in the past   Allergy    sneezing  late Fall   Anemia 01/23/12   H/H 11.7/35   Anxiety state, unspecified    Arthritis    Benign paroxysmal positional vertigo    Depression    Depressive disorder, not elsewhere classified    Diverticulosis    Esophageal reflux    Esophageal stricture    Gallstones    Heart murmur    Hiatal hernia    History of hyperparathyroidism    History of UTI    HTN (hypertension)    Hyperlipidemia    OSA (obstructive sleep apnea) 05/27/2018   Other abnormal glucose    Personal history of other diseases of digestive system    12-15 since 1990, no recurrence 2006   PONV (postoperative nausea and vomiting)    Unspecified hypothyroidism    Vitamin D deficiency    Past Surgical History:  Procedure Laterality Date   CATARACT EXTRACTION, BILATERAL Bilateral 12/06/2019   [facility]   CESAREAN SECTION  05/08/1975   CHOLECYSTECTOMY  05/07/2002   COLONOSCOPY  12/06/2003   [facility]   DENTAL SURGERY  1962, 2012   Age 80-Abscessed Teeth Extracted    PARATHYROIDECTOMY Left 02/13/2013   Procedure: LEFT INFERIOR PARATHYROIDECTOMY with frozen section ;  Surgeon: Velora Heckler, MD;  Location: [facility];  Service: General;  Laterality: Left;   TOTAL ABDOMINAL HYSTERECTOMY W/ BILATERAL SALPINGOOPHORECTOMY  05/08/2003   Ovarian Cyst   Social History   Socioeconomic History   Marital status: Married    Spouse name: Not on file   Number of children: 1   Years of education: Not on file   Highest education level: Not on file  Occupational History   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    Employer: RETIRED   Occupation: retired Visual merchandiser  Tobacco Use   Smoking status: Former    Current packs/day: 0.00    Types: Cigarettes    Quit date: 05/07/1973    Years  since quitting: 49.5   Smokeless tobacco: Never   Tobacco comments:    Smoked 1968-1975 up to 2 ppd  Vaping Use   Vaping status: Never Used  Substance and Sexual Activity   Alcohol use: Yes    Alcohol/week: 14.0 standard drinks of alcohol    Types: 14 Standard drinks or equivalent per week    Comment: 2 nightly wines   Drug use: No   Sexual activity: Not Currently  Other Topics Concern   Not on file  Social History Narrative   Regular exercise: no   Caffeine use: daily  Social Determinants of Health   Financial Resource Strain: Low Risk  (01/14/2021)   Overall Financial Resource Strain (CARDIA)    Difficulty of Paying Living Expenses: Not hard at all  Food Insecurity: No Food Insecurity (01/14/2021)   Hunger Vital Sign    Worried About Running Out of Food in the Last Year: Never true    Ran Out of Food in the Last Year: Never true  Transportation Needs: No Transportation Needs (01/14/2021)   PRAPARE - Administrator, Civil Service (Medical): No    Lack of Transportation (Non-Medical): No  Physical Activity: Insufficiently Active (01/14/2021)   Exercise Vital Sign    Days of Exercise per Week: 3 days    Minutes of Exercise per Session: 30 min  Stress: Stress Concern Present (01/14/2021)   [employer] of Occupational Health - Occupational Stress Questionnaire    Feeling of Stress : To some extent  Social Connections: Moderately Integrated (01/14/2021)   Social Connection and Isolation Panel [NHANES]    Frequency of Communication with Friends and Family: More than three times a week    Frequency of Social Gatherings with Friends and Family: More than three times a week    Attends Religious Services: Never    Database administrator or Organizations: Yes    Attends Banker Meetings: 1 to 4 times per year    Marital Status: Married  Catering manager Violence: Not At Risk (01/14/2021)   Humiliation, Afraid, Rape, and Kick questionnaire    Fear of  Current or Ex-Partner: No    Emotionally Abused: No    Physically Abused: No    Sexually Abused: No   Current Outpatient Medications on File Prior to Visit  Medication Sig Dispense Refill   alendronate (FOSAMAX) 70 MG tablet TAKE 1 TABLET (70 MG TOTAL) BY MOUTH EVERY 7 DAYS WITH FULL GLASS WATER ON EMPTY STOMACH 12 tablet 3   ALPRAZolam (XANAX) 0.5 MG tablet TAKE 0.5-1 TABLETS (0.25-0.5 MG TOTAL) BY MOUTH 2 (TWO) TIMES DAILY AS NEEDED FOR ANXIETY. 30 tablet 0   azelastine (ASTELIN) 0.1 % nasal spray Place 1 spray into both nostrils 2 (two) times daily. Use in each nostril as directed 30 mL 0   Cholecalciferol (VITAMIN D3) 2000 UNITS TABS Take 2 tablets by mouth daily.      cyanocobalamin 100 MCG tablet Take 100 mcg by mouth daily.     ezetimibe (ZETIA) 10 MG tablet Take 1 tablet (10 mg total) by mouth daily. 90 tablet 3   FLUoxetine (PROZAC) 40 MG capsule TAKE 1 CAPSULE (40 MG TOTAL) BY MOUTH DAILY. 30 capsule 0   fluticasone (FLONASE) 50 MCG/ACT nasal spray Place 1 spray into both nostrils daily. 16 g 2   levothyroxine (SYNTHROID) 75 MCG tablet TAKE 1 TABLET BY MOUTH EVERY DAY 90 tablet 0   losartan (COZAAR) 50 MG tablet TAKE 1 TABLET BY MOUTH EVERY DAY 90 tablet 1   pantoprazole (PROTONIX) 40 MG tablet Take 1 tablet (40 mg total) by mouth daily. 90 tablet 1   No current facility-administered medications on file prior to visit.   Allergies  Allergen Reactions   Augmentin [Amoxicillin-Pot Clavulanate] Rash   Family History  Problem Relation Age of Onset   Coronary artery disease Father    Heart attack Father 52   COPD Father    Diabetes Mother    Dysrhythmia Mother    Depression Mother    COPD Mother    Colon polyps Mother  Kidney disease Mother    Prostate cancer Brother    Other Brother        Wegener's in sinus; [facility]   Heart disease Brother    Arthritis Brother    Hypertension Brother    Prostate cancer Brother    Cancer Brother        Soft tissue in the face/temple    Stroke Maternal Aunt        >65   Stroke Maternal Grandmother        >65   Stroke Maternal Grandfather 47    Objective:   Physical Exam BP 132/64   Pulse 62   Ht 5\' 5"  (1.651 m)   Wt 192 lb 3.2 oz (87.2 kg)   SpO2 99%   BMI 31.98 kg/m   Wt Readings from Last 3 Encounters:  11/27/22 192 lb 3.2 oz (87.2 kg)  06/13/22 190 lb 6.4 oz (86.4 kg)  02/05/22 191 lb 12.8 oz (87 kg)   Constitutional: overweight, in NAD Eyes:  EOMI, no exophthalmos ENT: no neck masses, no cervical lymphadenopathy Cardiovascular: RRR, No MRG Respiratory: CTA B Musculoskeletal: no deformities Skin:no rashes Neurological: no tremor with outstretched hands  Assessment:     1.  History of primary hyperparathyroidism - status post left superior parathyroidectomy 02/13/2013, by Dr. Gerrit Friends -Please see previous endocrinology notes  2. Thyroid nodule - thyroid ultrasound 10/08/2012: Thyroid echotexture is heterogeneous.  Right lobe: Measures 3 x 1.2 x 1.1 cm.  Left lobe: Measures 2.9 x 1.9 x 0.9 cm.  Isthmus: Measures 0.4 cm.  Focal lesions: Within the lower pole of the right lobe of thyroid gland there is a hypoechoic nodule containing coarsened calcifications. This measures 7 x 7 x 7 mm.  No lymphadenopathy identified. - thyroid U/S 03/22/2014: Right thyroid lobe: 3.4 x 1.1 x 1.0 cm. Stable to slightly smaller focal area of nodularity with dystrophic shadowing calcifications measures approximately 0.6 cm in diameter. The thyroid parenchyma is very heterogeneous and shows no abnormal vascularity. Left thyroid lobe: 2.9 x 1.1 x 1.1 cm. No nodules visualized. Heterogeneous parenchyma noted without abnormal vascularity. The left lobe is small in size.  Isthmus Thickness: 0.9 cm.  No nodules visualized. Lymphadenopathy None visualized.  3. Hypothyroidism   4. vitamin D deficiency  5. OP  Plan:     1.  History of primary Hyperparathyroidism: -Patient with history of primary hyperparathyroidism with  minimally elevated calcium but high PTH levels in the past.  She is now s/p parathyroidectomy in 02/2013 by Dr. Gerrit Friends.  At that time, she had resection of a 0.5 g adenoma.  Subsequent calcium and PTH levels were normal: -Recent calcium levels were normal: Lab Results  Component Value Date   CALCIUM 9.2 02/05/2022   CALCIUM 9.6 01/25/2021  -Can continue to follow-up with annual calcium levels on BMP at annual physical exams  2. R thyroid nodule -She had a 7 mm thyroid nodule with internal calcifications which was stable and even smaller on the latest ultrasound -No neck compression symptoms -Follow-up ultrasound needed unless she becomes symptomatic with neck compression symptoms or a nodule becomes obvious on palpation  3. Hypothyroidism - latest thyroid labs reviewed with pt. >> normal: Lab Results  Component Value Date   TSH 0.59 02/05/2022  - she continues on LT4 75 mcg daily, decreased at last visit - pt feels good on this dose. - we discussed about taking the thyroid hormone every day, with water, >30 minutes before breakfast, separated by >4 hours from  acid reflux medications, calcium, iron, multivitamins. Pt. is taking it correctly. - will check thyroid tests today: TSH and fT4 - If labs are abnormal, she will need to return for repeat TFTs in 1.5 months  4. Vit D deficiency -Vitamin D level was normal at last visit: Lab Results  Component Value Date   VD25OH 60.13 11/22/2021  -She continues on 4000 units vitamin D daily -We will recheck the vitamin D level today  5.  Osteoporosis  -Patient with a history of osteoporosis at the radial site before her parathyroid surgery. DXA scan from 2019 showed an improvement in the T-scores, however, they were checked on another bone density machine.  She had a repeat DXA scan in 11/2018 and this showed worsening of her spine and hips T-scores.  At that time, we discussed about possible treatment for osteoporosis and we discussed at length  about medication classes, benefits, and side effects.  Her co-pay for Prolia injection was high, $255.  She declined the injection.  She also declined p.o. or IV bisphosphonates. -She had another bone density scan performed in 2022 and this showed a significant decrease in bone density at the left femoral neck, but otherwise nonsignificant changes.  At that time, we again discussed about possibly starting medication and she agreed to try Fosamax.  I advised her to take it with a full glass of water and not lay down for an hour after taking it.  Also, to take it 30 minutes before levothyroxine. -At today's visit, she tells me that she takes Fosamax consistently.  No side effects.  No jaw/thigh/hip pain. -I encouraged her to do weightbearing exercises 5 out of 7 days and also balance exercises -She will be due for another bone density scan next month.  I ordered this today.  Needs refills LT4.  Component     Latest Ref Rng 11/27/2022  TSH     0.35 - 5.50 uIU/mL 0.89   T4,Free(Direct)     0.60 - 1.60 ng/dL 4.09   VITD     81.19 - 100.00 ng/mL 66.45   Normal labs.  Results: 12/10/2022 ([facility]) Lumbar spine L1-L4 Femoral neck (FN) 33% distal radius Ultra distal radius  T-score -2.4 RFN: -1.9 LFN: -2.4 -3.0 -3.8  Change in BMD from previous DXA test (%) -1.4% n/a -11%* n/a  (*) statistically significant  Based on the above results, I suggested Prolia.  Carlus Pavlov, MD PhD [facility]

## 2022-11-27 NOTE — Patient Instructions (Addendum)
  Please continue vitamin D 4000 units daily.  Continue Levothyroxine 75 mcg daily.  Take the thyroid hormone every day, with water, at least 30 minutes before breakfast, separated by at least 4 hours from: - acid reflux medications - calcium - iron - multivitamins  Please stop at the lab.  Please continue alendronate 70 mg weekly.  Try to schedule a new bone density scan.  Please come back for a follow-up appointment in 1 year.

## 2022-12-07 ENCOUNTER — Ambulatory Visit: Payer: Medicare Other | Admitting: Clinical

## 2022-12-10 ENCOUNTER — Ambulatory Visit (HOSPITAL_BASED_OUTPATIENT_CLINIC_OR_DEPARTMENT_OTHER)
Admission: RE | Admit: 2022-12-10 | Discharge: 2022-12-10 | Disposition: A | Discharge status: Home / Self Care | Payer: Medicare Other | Source: Ambulatory Visit | Attending: Internal Medicine | Admitting: Internal Medicine

## 2022-12-10 DIAGNOSIS — Z780 Asymptomatic menopausal state: Secondary | ICD-10-CM | POA: Insufficient documentation

## 2022-12-10 DIAGNOSIS — M8080XD Other osteoporosis with current pathological fracture, unspecified site, subsequent encounter for fracture with routine healing: Secondary | ICD-10-CM | POA: Insufficient documentation

## 2022-12-10 DIAGNOSIS — Z87891 Personal history of nicotine dependence: Secondary | ICD-10-CM | POA: Diagnosis not present

## 2022-12-10 DIAGNOSIS — Z8262 Family history of osteoporosis: Secondary | ICD-10-CM | POA: Diagnosis not present

## 2022-12-10 DIAGNOSIS — E039 Hypothyroidism, unspecified: Secondary | ICD-10-CM | POA: Diagnosis not present

## 2022-12-11 ENCOUNTER — Ambulatory Visit: Payer: Medicare Other | Admitting: Clinical

## 2022-12-14 ENCOUNTER — Ambulatory Visit: Payer: Medicare Other | Admitting: Clinical

## 2022-12-14 DIAGNOSIS — F331 Major depressive disorder, recurrent, moderate: Secondary | ICD-10-CM

## 2022-12-14 NOTE — Progress Notes (Signed)
Diagnosis: F33.1, Major Depression, Recurrent, Moderate,  F41.1 Time of session: 3:03 pm- 3:58 pm CPT Code: 40981X (in person)  Gail Fuentes was seen in person for individual therapy. She reported continued improvement in mood since her last session, which she attributed to increased contact with her family, coupled with a quiet week at home over the past week. Session focused on processing dynamics in her relationships and considering how she might maintain her improved mood. She indicated a plan to communicate to family now that she does not want to host thanksgiving this year. She is scheduled to be seen again in three weeks.    Treatment Plan Client Abilities/Strengths  Gail Fuentes presented as motivated to engage in therapy and able to describe her challenges.  Client Treatment Preferences  Gail Fuentes prefers in-person appointments  Client Statement of Needs  Gail Fuentes is seeking cognitive behavioral therapy to manage symptoms of depression and anxiety.  Treatment Level  Biweekly  Symptoms  Anxiety: Excessive worry, sleep irregularity, difficulty relaxing (Status: maintained). Depression:  Depressed mood, lack of motivation, withdrawal from previously enjoyed activities, hopelessness  (Status: maintained).  Problems Addressed  New Description, New Description  Goals 1. Gail Fuentes has a history of recurrent depression Objective Gail Fuentes will increase self esteem and self compassion Target Date: 2023-01-17 Frequency: Daily Progress: 0 Modality: individual Objective Gail Fuentes will reduce anxious planning behavior and accept her personal limitations Target Date: 2023-01-17 Frequency: Biweekly Signed by: Charlyne Mom, PhD Version 2 Signed on: 2021-03-16 11:22:00 -0500 Generated on: 2022-12-07T15:59:08Z [name] DOB: [birth year] Provider: Charlyne Mom, PhD Created on: 2021-03-16 11:10:24 -0500 [facility] [name] [place], [place]  [address] [phone] Progress: 0 Modality: individual Objective Gail Fuentes will improve her overall mood and sense of well-being Target Date: 2023-01-17 Frequency: Biweekly Progress: 0 Modality: individual Related Interventions 1. Therapist will provide Gail Fuentes with opportunities to process her experiences in session 2. Therapist will help Gail Fuentes to identify and disengage from maladaptive thought and behavior  patterns using CBT based strategies 3. Therapist will engage Gail Fuentes in behavior activation, including incorporation of structure,  pleasant events, and mastery events into her routine 4. Therapist will provide referrals for additional resources as appropriate  2. Gail Fuentes has difficulty setting boundaries in relationships and  prioritizing self-care Objective Gail Fuentes will develop strategies to support healthy communication and boundary setting in  relationships Target Date: 2023-01-17 Frequency: Biweekly Progress: 0 Modality: individual Related Interventions 1. Therapist will provide Gail Fuentes with communication strategies and opportunities to practice,  including writing down her thoughts, in-session processing of interactions, and in-vivo role  plays Diagnosis Axis  none 300.02 (Generalized anxiety disorder) - Open - [Signifier: n/a]  Axis  none 296.32 (Major depressive affective disorder, recurrent episode, moderate) - Open -  [Signifier: n/a]  Signed by: Charlyne Mom, PhD Version 2 Signed on: 2021-03-16 11:22:00 -0500 Generated on: 2022-12-07T15:59:08Z [name] DOB: Jan 06, 1946 Provider: Charlyne Mom, PhD Created on: 2021-03-16 11:10:24 -0500 [facility] [name] [place], [place] [address] [phone] Conditions For Discharge Achievement of treatment goals and objective   [name], PhD               [name], PhD

## 2022-12-19 ENCOUNTER — Other Ambulatory Visit: Payer: Self-pay | Admitting: Family Medicine

## 2022-12-19 DIAGNOSIS — F339 Major depressive disorder, recurrent, unspecified: Secondary | ICD-10-CM

## 2022-12-20 ENCOUNTER — Encounter (INDEPENDENT_AMBULATORY_CARE_PROVIDER_SITE_OTHER): Payer: Self-pay

## 2022-12-20 ENCOUNTER — Ambulatory Visit: Payer: Medicare Other | Admitting: Clinical

## 2022-12-23 ENCOUNTER — Other Ambulatory Visit: Payer: Self-pay | Admitting: Family Medicine

## 2022-12-23 DIAGNOSIS — F339 Major depressive disorder, recurrent, unspecified: Secondary | ICD-10-CM

## 2023-01-01 ENCOUNTER — Telehealth: Payer: Self-pay | Admitting: Family Medicine

## 2023-01-01 ENCOUNTER — Encounter: Payer: Self-pay | Admitting: Family Medicine

## 2023-01-01 ENCOUNTER — Other Ambulatory Visit: Payer: Self-pay | Admitting: Family Medicine

## 2023-01-01 DIAGNOSIS — F339 Major depressive disorder, recurrent, unspecified: Secondary | ICD-10-CM

## 2023-01-01 DIAGNOSIS — K219 Gastro-esophageal reflux disease without esophagitis: Secondary | ICD-10-CM

## 2023-01-01 MED ORDER — PANTOPRAZOLE SODIUM 40 MG PO TBEC
40.0000 mg | DELAYED_RELEASE_TABLET | Freq: Every day | ORAL | 0 refills | Status: DC
Start: 2023-01-01 — End: 2023-01-18

## 2023-01-01 MED ORDER — ALPRAZOLAM 0.5 MG PO TABS
ORAL_TABLET | ORAL | 0 refills | Status: DC
Start: 2023-01-01 — End: 2024-01-22

## 2023-01-01 MED ORDER — FLUOXETINE HCL 40 MG PO CAPS
40.0000 mg | ORAL_CAPSULE | Freq: Every day | ORAL | 0 refills | Status: AC
Start: 2023-01-01 — End: ?

## 2023-01-01 NOTE — Addendum Note (Signed)
Addended by: Pearline Cables on: 01/01/2023 05:46 PM   Modules accepted: Orders

## 2023-01-01 NOTE — Telephone Encounter (Signed)
Okay for refills? Pt last OV was in October 2023

## 2023-01-01 NOTE — Telephone Encounter (Signed)
Prescription Request  01/01/2023  Is this a "Controlled Substance" medicine? No  LOV: Visit date not found  What is the name of the medication or equipment? FLUoxetine (PROZAC) 40 MG capsule  ALPRAZolam (XANAX) 0.5 MG tablet  Have you contacted your pharmacy to request a refill? Yes   Which pharmacy would you like this sent to?   [id number] [place], [place] - [address] [place] [address] Phone: [phone] Fax: [phone]    Patient notified that their request is being sent to the clinical staff for review and that they should receive a response within 2 business days.   Please advise at [facility] [phone]  Pt isn't sure if she needs an appt to get the medication FLUoxetine (PROZAC) 40 MG capsule and would like to discuss with Dr. Patsy Lager. Please call pt & advise

## 2023-01-03 ENCOUNTER — Telehealth: Payer: Medicare Other | Admitting: Physician Assistant

## 2023-01-03 DIAGNOSIS — M5441 Lumbago with sciatica, right side: Secondary | ICD-10-CM | POA: Diagnosis not present

## 2023-01-03 DIAGNOSIS — M5442 Lumbago with sciatica, left side: Secondary | ICD-10-CM | POA: Diagnosis not present

## 2023-01-03 MED ORDER — PREDNISONE 10 MG (21) PO TBPK
ORAL_TABLET | ORAL | 0 refills | Status: DC
Start: 2023-01-03 — End: 2023-01-18

## 2023-01-03 NOTE — Progress Notes (Signed)
We are sorry that you are not feeling well.  Here is how we plan to help!  Based on what you have shared with me it looks like you mostly have acute back pain.  Acute back pain is defined as musculoskeletal pain that can resolve in 1-3 weeks with conservative treatment.  I have prescribed a prednisone (steroid) tablet pack.  Some patients experience stomach irritation or in increased heartburn with anti-inflammatory drugs. Back pain is very common.  The pain often gets better over time.  The cause of back pain is usually not dangerous.  Most people can learn to manage their back pain on their own.  Home Care Stay active.  Start with short walks on flat ground if you can.  Try to walk farther each day. Do not sit, drive or stand in one place for more than 30 minutes.  Do not stay in bed. Do not avoid exercise or work.  Activity can help your back heal faster. Be careful when you bend or lift an object.  Bend at your knees, keep the object close to you, and do not twist. Sleep on a firm mattress.  Lie on your side, and bend your knees.  If you lie on your back, put a pillow under your knees. Only take medicines as told by your doctor. Put ice on the injured area. Put ice in a plastic bag Place a towel between your skin and the bag Leave the ice on for 15-20 minutes, 3-4 times a day for the first 2-3 days. 210 After that, you can switch between ice and heat packs. Ask your doctor about back exercises or massage. Avoid feeling anxious or stressed.  Find good ways to deal with stress, such as exercise.  Get Help Right Way If: Your pain does not go away with rest or medicine. Your pain does not go away in 1 week. You have new problems. You do not feel well. The pain spreads into your legs. You cannot control when you poop (bowel movement) or pee (urinate) You feel sick to your stomach (nauseous) or throw up (vomit) You have belly (abdominal) pain. You feel like you may pass out (faint). If  you develop a fever.  Make Sure you: Understand these instructions. Will watch your condition Will get help right away if you are not doing well or get worse.  Your e-visit answers were reviewed by a board certified advanced clinical practitioner to complete your personal care plan.  Depending on the condition, your plan could have included both over the counter or prescription medications.  If there is a problem please reply  once you have received a response from your provider.  Your safety is important to [name].  If you have drug allergies check your prescription carefully.    You can use MyChart to ask questions about today's visit, request a non-urgent call back, or ask for a work or school excuse for 24 hours related to this e-Visit. If it has been greater than 24 hours you will need to follow up with your provider, or enter a new e-Visit to address those concerns.  You will get an e-mail in the next two days asking about your experience.  I hope that your e-visit has been valuable and will speed your recovery. Thank you for using e-visits.

## 2023-01-03 NOTE — Progress Notes (Signed)
I have spent 5 minutes in review of e-visit questionnaire, review and updating patient chart, medical decision making and response to patient.   William Cody Martin, PA-C    

## 2023-01-16 ENCOUNTER — Ambulatory Visit (INDEPENDENT_AMBULATORY_CARE_PROVIDER_SITE_OTHER): Payer: Medicare Other | Admitting: Clinical

## 2023-01-16 DIAGNOSIS — F331 Major depressive disorder, recurrent, moderate: Secondary | ICD-10-CM | POA: Diagnosis not present

## 2023-01-16 DIAGNOSIS — F411 Generalized anxiety disorder: Secondary | ICD-10-CM | POA: Diagnosis not present

## 2023-01-16 NOTE — Progress Notes (Signed)
Diagnosis: F33.1, Major Depression, Recurrent, Moderate,  F41.1 Time of session: 3:03 pm- 3:58 pm CPT Code: 82956O (in person)  Gail Fuentes was seen in person for individual therapy. She reported having experienced good days and bad since her last session, and attributed bad days to stressors suffered  by family members. Therapist pointed out the chronic nature of many of the situations described, suggesting that the situation may not suffer if Gail Fuentes pauses to take time for herself. She has been reading [employer] and has found it helpful. She is scheduled to be seen again in two weeks.  Treatment Plan Client Abilities/Strengths  Gail Fuentes presented as motivated to engage in therapy and able to describe her challenges.  Client Treatment Preferences  Gail Fuentes prefers in-person appointments  Client Statement of Needs  Gail Fuentes is seeking cognitive behavioral therapy to manage symptoms of depression and anxiety.  Treatment Level  Biweekly  Symptoms  Anxiety: Excessive worry, sleep irregularity, difficulty relaxing (Status: maintained). Depression:  Depressed mood, lack of motivation, withdrawal from previously enjoyed activities, hopelessness  (Status: maintained).  Problems Addressed  New Description, New Description  Goals 1. Gail Fuentes has a history of recurrent depression Objective Gail Fuentes will increase self esteem and self compassion Target Date: 2023-01-17 Frequency: Daily Progress: 0 Modality: individual Objective Gail Fuentes will reduce anxious planning behavior and accept her personal limitations Target Date: 2023-01-17 Frequency: Biweekly Signed by: Charlyne Mom, PhD Version 2 Signed on: 2021-03-16 11:22:00 -0500 Generated on: 2022-12-07T15:59:08Z [name] DOB: 1945/05/18 Provider: Charlyne Mom, PhD Created on: 2021-03-16 11:10:24 -0500 [facility] [name] [place], [place] [address] [phone] Progress: 0 Modality:  individual Objective Gail Fuentes will improve her overall mood and sense of well-being Target Date: 2023-01-17 Frequency: Biweekly Progress: 0 Modality: individual Related Interventions 1. Therapist will provide [name] with opportunities to process her experiences in session 2. Therapist will help Gail Fuentes to identify and disengage from maladaptive thought and behavior  patterns using CBT based strategies 3. Therapist will engage Gail Fuentes in behavior activation, including incorporation of structure,  pleasant events, and mastery events into her routine 4. Therapist will provide referrals for additional resources as appropriate  2. Gail Fuentes has difficulty setting boundaries in relationships and  prioritizing self-care Objective Gail Fuentes will develop strategies to support healthy communication and boundary setting in  relationships Target Date: 2023-01-17 Frequency: Biweekly Progress: 0 Modality: individual Related Interventions 1. Therapist will provide Gail Fuentes with communication strategies and opportunities to practice,  including writing down her thoughts, in-session processing of interactions, and in-vivo role  plays Diagnosis Axis  none 300.02 (Generalized anxiety disorder) - Open - [Signifier: n/a]  Axis  none 296.32 (Major depressive affective disorder, recurrent episode, moderate) - Open -  [Signifier: n/a]  Signed by: Charlyne Mom, PhD Version 2 Signed on: 2021-03-16 11:22:00 -0500 Generated on: 2022-12-07T15:59:08Z [name] DOB: Aug 04, 1945 Provider: Charlyne Mom, PhD Created on: 2021-03-16 11:10:24 -0500 [facility] [name] [place], [place] [address] [phone] Conditions For Discharge Achievement of treatment goals and objective   Gail Noa, PhD               Gail Noa, PhD        Gail Noa, PhD

## 2023-01-17 ENCOUNTER — Ambulatory Visit: Payer: Medicare Other | Admitting: Clinical

## 2023-01-18 ENCOUNTER — Ambulatory Visit (INDEPENDENT_AMBULATORY_CARE_PROVIDER_SITE_OTHER): Payer: Medicare Other | Admitting: *Deleted

## 2023-01-18 VITALS — BP 113/68 | HR 73 | Ht 65.0 in | Wt 189.4 lb

## 2023-01-18 DIAGNOSIS — Z23 Encounter for immunization: Secondary | ICD-10-CM

## 2023-01-18 DIAGNOSIS — Z1231 Encounter for screening mammogram for malignant neoplasm of breast: Secondary | ICD-10-CM | POA: Diagnosis not present

## 2023-01-18 DIAGNOSIS — Z0000 Encounter for general adult medical examination without abnormal findings: Secondary | ICD-10-CM

## 2023-01-18 NOTE — Progress Notes (Signed)
Subjective:   Gail Fuentes is a 77 y.o. female who presents for Medicare Annual (Subsequent) preventive examination.  Visit Complete: In person   Cardiac Risk Factors include: advanced age (>65men, >39 women);dyslipidemia;hypertension;obesity (BMI >30kg/m2)     Objective:    Today's Vitals   01/18/23 0933  BP: 113/68  Pulse: 73  Weight: 189 lb 6.4 oz (85.9 kg)  Height: 5\' 5"  (1.651 m)   Body mass index is 31.52 kg/m.     01/18/2023    9:42 AM 01/16/2022    8:24 AM 01/14/2021   10:08 AM 11/27/2018    3:07 PM 11/13/2018    1:15 PM 11/22/2017   10:15 AM 11/21/2016    9:15 AM  Advanced Directives  Does Patient Have a Medical Advance Directive? Yes Yes Yes Yes Yes Yes Yes  Type of Estate agent of [employer] Power of [place];Living will Healthcare Power of [place];Living will Healthcare Power of [place];Living will  Healthcare Power of [place];Living will Healthcare Power of [place];Living will  Does patient want to make changes to medical advance directive? No - Patient declined No - Patient declined  No - Patient declined No - Patient declined    Copy of Healthcare Power of Attorney in Chart? No - copy requested No - copy requested No - copy requested No - copy requested  No - copy requested No - copy requested  Would patient like information on creating a medical advance directive?  No - Patient declined         Current Medications (verified) Outpatient Encounter Medications as of 01/18/2023  Medication Sig   alendronate (FOSAMAX) 70 MG tablet TAKE 1 TABLET (70 MG TOTAL) BY MOUTH EVERY 7 DAYS WITH FULL GLASS WATER ON EMPTY STOMACH   ALPRAZolam (XANAX) 0.5 MG tablet TAKE 0.5-1 TABLETS (0.25-0.5 MG TOTAL) BY MOUTH 2 (TWO) TIMES DAILY AS NEEDED FOR ANXIETY.   Cholecalciferol (VITAMIN D3) 2000 UNITS TABS Take 2 tablets by mouth daily.    cyanocobalamin 100 MCG tablet Take 100 mcg by mouth daily.   ezetimibe (ZETIA) 10 MG tablet Take 1 tablet  (10 mg total) by mouth daily.   FLUoxetine (PROZAC) 40 MG capsule Take 1 capsule (40 mg total) by mouth daily.   levothyroxine (SYNTHROID) 75 MCG tablet Take 1 tablet (75 mcg total) by mouth daily.   losartan (COZAAR) 50 MG tablet TAKE 1 TABLET BY MOUTH EVERY DAY   omeprazole (PRILOSEC) 20 MG capsule Take 20 mg by mouth daily.   [DISCONTINUED] pantoprazole (PROTONIX) 40 MG tablet Take 1 tablet (40 mg total) by mouth daily.   [DISCONTINUED] predniSONE (STERAPRED UNI-PAK 21 TAB) 10 MG (21) TBPK tablet Take following package directions   No facility-administered encounter medications on file as of 01/18/2023.    Allergies (verified) Augmentin [amoxicillin-pot clavulanate]   History: Past Medical History:  Diagnosis Date   Abnormal EKG    per patient- states evaluated by Dr Daleen Squibb in the past   Allergy    sneezing  late Fall   Anemia 01/23/12   H/H 11.7/35   Anxiety state, unspecified    Arthritis    Benign paroxysmal positional vertigo    Depression    Depressive disorder, not elsewhere classified    Diverticulosis    Esophageal reflux    Esophageal stricture    Gallstones    Heart murmur    Hiatal hernia    History of hyperparathyroidism    History of UTI    HTN (hypertension)  Hyperlipidemia    OSA (obstructive sleep apnea) 05/27/2018   Other abnormal glucose    Personal history of other diseases of digestive system    12-15 since 1990, no recurrence 2006   PONV (postoperative nausea and vomiting)    Unspecified hypothyroidism    Vitamin D deficiency    Past Surgical History:  Procedure Laterality Date   CATARACT EXTRACTION, BILATERAL Bilateral 12/06/2019   [facility]   CESAREAN SECTION  05/08/1975   CHOLECYSTECTOMY  05/07/2002   COLONOSCOPY  12/06/2003   [facility]   DENTAL SURGERY  1962, 2012   Age 14-Abscessed Teeth Extracted    PARATHYROIDECTOMY Left 02/13/2013   Procedure: LEFT INFERIOR PARATHYROIDECTOMY with frozen section ;  Surgeon: Velora Heckler, MD;  Location: [facility];  Service: General;  Laterality: Left;   TOTAL ABDOMINAL HYSTERECTOMY W/ BILATERAL SALPINGOOPHORECTOMY  05/08/2003   Ovarian Cyst   Family History  Problem Relation Age of Onset   Coronary artery disease Father    Heart attack Father 57   COPD Father    Diabetes Mother    Dysrhythmia Mother    Depression Mother    COPD Mother    Colon polyps Mother    Kidney disease Mother    Prostate cancer Brother    Other Brother        Wegener's in sinus; [facility]   Heart disease Brother    Arthritis Brother    Hypertension Brother    Prostate cancer Brother    Cancer Brother        Soft tissue in the face/temple   Stroke Maternal Aunt        >65   Stroke Maternal Grandmother        >65   Stroke Maternal Grandfather 22   Social History   Socioeconomic History   Marital status: Married    Spouse name: Not on file   Number of children: 1   Years of education: Not on file   Highest education level: Not on file  Occupational History   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    Employer: RETIRED   Occupation: retired Visual merchandiser  Tobacco Use   Smoking status: Former    Current packs/day: 0.00    Types: Cigarettes    Quit date: 05/07/1973    Years since quitting: 49.7   Smokeless tobacco: Never   Tobacco comments:    Smoked 1968-1975 up to 2 ppd  Vaping Use   Vaping status: Never Used  Substance and Sexual Activity   Alcohol use: Yes    Alcohol/week: 14.0 standard drinks of alcohol    Types: 14 Standard drinks or equivalent per week    Comment: 2 nightly wines   Drug use: No   Sexual activity: Not Currently  Other Topics Concern   Not on file  Social History Narrative   Regular exercise: no   Caffeine use: daily   Social Determinants of Health   Financial Resource Strain: Low Risk  (01/18/2023)   Overall Financial Resource Strain (CARDIA)    Difficulty of Paying Living Expenses: Not hard at all  Food Insecurity: No Food Insecurity (01/18/2023)   Hunger Vital Sign     Worried About Running Out of Food in the Last Year: Never true    Ran Out of Food in the Last Year: Never true  Transportation Needs: No Transportation Needs (01/18/2023)   PRAPARE - Administrator, Civil Service (Medical): No    Lack of Transportation (Non-Medical): No  Physical Activity: Inactive (01/18/2023)   Exercise Vital Sign    Days of Exercise per Week: 0 days    Minutes of Exercise per Session: 0 min  Stress: Stress Concern Present (01/18/2023)   [employer] of Occupational Health - Occupational Stress Questionnaire    Feeling of Stress : Very much  Social Connections: Moderately Integrated (01/18/2023)   Social Connection and Isolation Panel [NHANES]    Frequency of Communication with Friends and Family: More than three times a week    Frequency of Social Gatherings with Friends and Family: Three times a week    Attends Religious Services: 1 to 4 times per year    Active Member of Clubs or Organizations: No    Attends Banker Meetings: Never    Marital Status: Married    Tobacco Counseling Counseling given: Not Answered Tobacco comments: Smoked [phone] up to 2 ppd   Clinical Intake:  Pre-visit preparation completed: Yes  Pain : No/denies pain  BMI - recorded: 31.52 Nutritional Status: BMI > 30  Obese Nutritional Risks: None Diabetes: No  How often do you need to have someone help you when you read instructions, pamphlets, or other written materials from your doctor or pharmacy?: 1 - Never  Interpreter Needed?: No  Information entered by :: Fremon Zacharia,CMA   Activities of Daily Living    01/18/2023    9:38 AM  In your present state of health, do you have any difficulty performing the following activities:  Hearing? 0  Vision? 0  Difficulty concentrating or making decisions? 1  Comment making decisions  Walking or climbing stairs? 0  Dressing or bathing? 0  Doing errands, shopping? 0  Preparing Food and eating ? N  Using  the Toilet? N  In the past six months, have you accidently leaked urine? Y  Do you have problems with loss of bowel control? N  Managing your Medications? N  Managing your Finances? N  Housekeeping or managing your Housekeeping? N    Patient Care Team: Copland, Gwenlyn Found, MD as PCP - General (Family Medicine) Kathleene Hazel, MD as PCP - Cardiology (Cardiology) Carlus Pavlov, MD as Consulting Physician (Internal Medicine) Freddy Finner, MD (Inactive) as Consulting Physician (Obstetrics and Gynecology) Manning Charity, OD as Referring Physician (Optometry)  Indicate any recent Medical Services you may have received from other than [facility] providers in the past year (date may be approximate).     Assessment:   This is a routine wellness examination for Gail Fuentes.  Hearing/Vision screen No results found.   Goals Addressed   None    Depression Screen    01/18/2023    9:45 AM 02/05/2022    1:22 PM 01/16/2022    8:25 AM 01/14/2021   10:01 AM 11/02/2019   11:16 AM 11/27/2018    3:19 PM 11/22/2017   10:15 AM  PHQ 2/9 Scores  PHQ - 2 Score 6 1 1 2 2 1 1   PHQ- 9 Score 16 4  7 9       Fall Risk    01/18/2023    9:41 AM 02/05/2022    1:22 PM 01/16/2022    8:25 AM 01/14/2021   10:09 AM 11/04/2020    1:11 PM  Fall Risk   Falls in the past year? 0 0 0 0 0  Number falls in past yr: 0 0 0 0 0  Injury with Fall? 0 0 0 0 0  Risk for fall due to : No Fall Risks No  Fall Risks No Fall Risks Orthopedic patient;Medication side effect   Follow up Falls evaluation completed Falls evaluation completed Falls evaluation completed Falls prevention discussed     MEDICARE RISK AT HOME: Medicare Risk at Home Any stairs in or around the home?: Yes If so, are there any without handrails?: Yes (steps going up to the porch) Home free of loose throw rugs in walkways, pet beds, electrical cords, etc?: Yes Adequate lighting in your home to reduce risk of falls?: Yes Life alert?: No Use of a cane,  walker or w/c?: No Grab bars in the bathroom?: No Shower chair or bench in shower?: Yes (built in bench) Elevated toilet seat or a handicapped toilet?: Yes  TIMED UP AND GO:  Was the test performed?  Yes  Length of time to ambulate 10 feet: 7 sec Gait steady and fast without use of assistive device    Cognitive Function:    11/22/2017   10:17 AM  MMSE - Mini Mental State Exam  Orientation to time 5  Orientation to Place 5  Registration 3  Attention/ Calculation 5  Recall 3  Language- name 2 objects 2  Language- repeat 1  Language- follow 3 step command 3  Language- read & follow direction 1  Write a sentence 1  Copy design 1  Total score 30        01/18/2023    9:50 AM 01/16/2022    8:30 AM  6CIT Screen  What Year? 0 points 0 points  What month? 0 points 0 points  What time? 0 points 0 points  Count back from 20 0 points 0 points  Months in reverse 0 points 0 points  Repeat phrase 0 points 2 points  Total Score 0 points 2 points    Immunizations Immunization History  Administered Date(s) Administered   Fluad Quad(high Dose 65+) 01/25/2021, 01/16/2022   Influenza Split 03/11/2012, 02/22/2017   Influenza Whole 02/20/2008, 03/06/2010   Influenza, High Dose Seasonal PF 03/17/2013, 02/22/2017, 01/15/2018   Influenza,inj,Quad PF,6+ Mos 03/04/2014   Influenza-Unspecified 03/08/2015, 03/06/2016, 01/27/2018   Moderna Sars-Covid-2 Vaccination 06/08/2019, 06/28/2019   PFIZER(Purple Top)SARS-COV-2 Vaccination 04/21/2020   Pneumococcal Conjugate-13 07/18/2015   Pneumococcal Polysaccharide-23 03/06/2013   Td 07/18/2015    TDAP status: Up to date  Flu Vaccine status: Completed at today's visit  Pneumococcal vaccine status: Up to date  Covid-19 vaccine status: Information provided on how to obtain vaccines.   Qualifies for Shingles Vaccine? Yes   Zostavax completed No   Shingrix Completed?: No.    Education has been provided regarding the importance of this vaccine.  Patient has been advised to call insurance company to determine out of pocket expense if they have not yet received this vaccine. Advised may also receive vaccine at local pharmacy or Health Dept. Verbalized acceptance and understanding.  Screening Tests Health Maintenance  Topic Date Due   Zoster Vaccines- Shingrix (1 of 2) Never done   INFLUENZA VACCINE  12/06/2022   COVID-19 Vaccine (4 - 2023-24 season) 01/06/2023   Medicare Annual Wellness (AWV)  01/17/2023   Colonoscopy  11/18/2024   DTaP/Tdap/Td (2 - Tdap) 07/17/2025   Pneumonia Vaccine 66+ Years old  Completed   DEXA SCAN  Completed   Hepatitis C Screening  Completed   HPV VACCINES  Aged Out    Health Maintenance  Health Maintenance Due  Topic Date Due   Zoster Vaccines- Shingrix (1 of 2) Never done   INFLUENZA VACCINE  12/06/2022   COVID-19 Vaccine (4 -  2023-24 season) 01/06/2023   Medicare Annual Wellness (AWV)  01/17/2023    Colorectal cancer screening: Type of screening: Colonoscopy. Completed 11/19/14. Repeat every 10 years  Mammogram status: Ordered 01/18/23. Pt provided with contact info and advised to call to schedule appt.   Bone Density status: Completed 12/10/22. Results reflect: Bone density results: OSTEOPOROSIS. Repeat every 2 years.  Lung Cancer Screening: (Low Dose CT Chest recommended if Age 80-80 years, 20 pack-year currently smoking OR have quit w/in 15years.) does not qualify.   Additional Screening:  Hepatitis C Screening: does qualify; Completed 07/18/15  Vision Screening: Recommended annual ophthalmology exams for early detection of glaucoma and other disorders of the eye. Is the patient up to date with their annual eye exam?  Yes  Who is the provider or what is the name of the office in which the patient attends annual eye exams? Dr. Randon Goldsmith If pt is not established with a provider, would they like to be referred to a provider to establish care? No .   Dental Screening: Recommended annual dental  exams for proper oral hygiene  Diabetic Foot Exam: N/a  Community Resource Referral / Chronic Care Management: CRR required this visit?  No   CCM required this visit?  No     Plan:     I have personally reviewed and noted the following in the patient's chart:   Medical and social history Use of alcohol, tobacco or illicit drugs  Current medications and supplements including opioid prescriptions. Patient is not currently taking opioid prescriptions. Functional ability and status Nutritional status Physical activity Advanced directives List of other physicians Hospitalizations, surgeries, and ER visits in previous 12 months Vitals Screenings to include cognitive, depression, and falls Referrals and appointments  In addition, I have reviewed and discussed with patient certain preventive protocols, quality metrics, and best practice recommendations. A written personalized care plan for preventive services as well as general preventive health recommendations were provided to patient.     Donne Anon, CMA   01/18/2023   After Visit Summary: sent to mychart  Nurse Notes: None

## 2023-01-18 NOTE — Patient Instructions (Signed)
Gail Fuentes , Thank you for taking time to come for your Medicare Wellness Visit. I appreciate your ongoing commitment to your health goals. Please review the following plan we discussed and let me know if I can assist you in the future.     This is a list of the screening recommended for you and due dates:  Health Maintenance  Topic Date Due   Zoster (Shingles) Vaccine (1 of 2) Never done   COVID-19 Vaccine (4 - 2023-24 season) 01/06/2023   Medicare Annual Wellness Visit  01/18/2024   Colon Cancer Screening  11/18/2024   DTaP/Tdap/Td vaccine (2 - Tdap) 07/17/2025   Pneumonia Vaccine  Completed   Flu Shot  Completed   DEXA scan (bone density measurement)  Completed   Hepatitis C Screening  Completed   HPV Vaccine  Aged Out     Next appointment: Follow up in one year for your annual wellness visit    Preventive Care 65 Years and Older, Female Preventive care refers to lifestyle choices and visits with your health care provider that can promote health and wellness. What does preventive care include? A yearly physical exam. This is also called an annual well check. Dental exams once or twice a year. Routine eye exams. Ask your health care provider how often you should have your eyes checked. Personal lifestyle choices, including: Daily care of your teeth and gums. Regular physical activity. Eating a healthy diet. Avoiding tobacco and drug use. Limiting alcohol use. Practicing safe sex. Taking low-dose aspirin every day. Taking vitamin and mineral supplements as recommended by your health care provider. What happens during an annual well check? The services and screenings done by your health care provider during your annual well check will depend on your age, overall health, lifestyle risk factors, and family history of disease. Counseling  Your health care provider may ask you questions about your: Alcohol use. Tobacco use. Drug use. Emotional well-being. Home and  relationship well-being. Sexual activity. Eating habits. History of falls. Memory and ability to understand (cognition). Work and work Astronomer. Reproductive health. Screening  You may have the following tests or measurements: Height, weight, and BMI. Blood pressure. Lipid and cholesterol levels. These may be checked every 5 years, or more frequently if you are over 39 years old. Skin check. Lung cancer screening. You may have this screening every year starting at age 29 if you have a 30-pack-year history of smoking and currently smoke or have quit within the past 15 years. Fecal occult blood test (FOBT) of the stool. You may have this test every year starting at age 47. Flexible sigmoidoscopy or colonoscopy. You may have a sigmoidoscopy every 5 years or a colonoscopy every 10 years starting at age 15. Hepatitis C blood test. Hepatitis B blood test. Sexually transmitted disease (STD) testing. Diabetes screening. This is done by checking your blood sugar (glucose) after you have not eaten for a while (fasting). You may have this done every 1-3 years. Bone density scan. This is done to screen for osteoporosis. You may have this done starting at age 76. Mammogram. This may be done every 1-2 years. Talk to your health care provider about how often you should have regular mammograms. Talk with your health care provider about your test results, treatment options, and if necessary, the need for more tests. Vaccines  Your health care provider may recommend certain vaccines, such as: Influenza vaccine. This is recommended every year. Tetanus, diphtheria, and acellular pertussis (Tdap, Td) vaccine. You may  need a Td booster every 10 years. Zoster vaccine. You may need this after age 76. Pneumococcal 13-valent conjugate (PCV13) vaccine. One dose is recommended after age 76. Pneumococcal polysaccharide (PPSV23) vaccine. One dose is recommended after age 76. Talk to your health care provider  about which screenings and vaccines you need and how often you need them. This information is not intended to replace advice given to you by your health care provider. Make sure you discuss any questions you have with your health care provider. Document Released: 05/20/2015 Document Revised: 01/11/2016 Document Reviewed: 02/22/2015 Elsevier Interactive Patient Education  2017 [employer].  Fall Prevention in the Home Falls can cause injuries. They can happen to people of all ages. There are many things you can do to make your home safe and to help prevent falls. What can I do on the outside of my home? Regularly fix the edges of walkways and driveways and fix any cracks. Remove anything that might make you trip as you walk through a door, such as a raised step or threshold. Trim any bushes or trees on the path to your home. Use bright outdoor lighting. Clear any walking paths of anything that might make someone trip, such as rocks or tools. Regularly check to see if handrails are loose or broken. Make sure that both sides of any steps have handrails. Any raised decks and porches should have guardrails on the edges. Have any leaves, snow, or ice cleared regularly. Use sand or salt on walking paths during winter. Clean up any spills in your garage right away. This includes oil or grease spills. What can I do in the bathroom? Use night lights. Install grab bars by the toilet and in the tub and shower. Do not use towel bars as grab bars. Use non-skid mats or decals in the tub or shower. If you need to sit down in the shower, use a plastic, non-slip stool. Keep the floor dry. Clean up any water that spills on the floor as soon as it happens. Remove soap buildup in the tub or shower regularly. Attach bath mats securely with double-sided non-slip rug tape. Do not have throw rugs and other things on the floor that can make you trip. What can I do in the bedroom? Use night lights. Make sure  that you have a light by your bed that is easy to reach. Do not use any sheets or blankets that are too big for your bed. They should not hang down onto the floor. Have a firm chair that has side arms. You can use this for support while you get dressed. Do not have throw rugs and other things on the floor that can make you trip. What can I do in the kitchen? Clean up any spills right away. Avoid walking on wet floors. Keep items that you use a lot in easy-to-reach places. If you need to reach something above you, use a strong step stool that has a grab bar. Keep electrical cords out of the way. Do not use floor polish or wax that makes floors slippery. If you must use wax, use non-skid floor wax. Do not have throw rugs and other things on the floor that can make you trip. What can I do with my stairs? Do not leave any items on the stairs. Make sure that there are handrails on both sides of the stairs and use them. Fix handrails that are broken or loose. Make sure that handrails are as long as the stairways.  Check any carpeting to make sure that it is firmly attached to the stairs. Fix any carpet that is loose or worn. Avoid having throw rugs at the top or bottom of the stairs. If you do have throw rugs, attach them to the floor with carpet tape. Make sure that you have a light switch at the top of the stairs and the bottom of the stairs. If you do not have them, ask someone to add them for you. What else can I do to help prevent falls? Wear shoes that: Do not have high heels. Have rubber bottoms. Are comfortable and fit you well. Are closed at the toe. Do not wear sandals. If you use a stepladder: Make sure that it is fully opened. Do not climb a closed stepladder. Make sure that both sides of the stepladder are locked into place. Ask someone to hold it for you, if possible. Clearly mark and make sure that you can see: Any grab bars or handrails. First and last steps. Where the edge of  each step is. Use tools that help you move around (mobility aids) if they are needed. These include: Canes. Walkers. Scooters. Crutches. Turn on the lights when you go into a dark area. Replace any light bulbs as soon as they burn out. Set up your furniture so you have a clear path. Avoid moving your furniture around. If any of your floors are uneven, fix them. If there are any pets around you, be aware of where they are. Review your medicines with your doctor. Some medicines can make you feel dizzy. This can increase your chance of falling. Ask your doctor what other things that you can do to help prevent falls. This information is not intended to replace advice given to you by your health care provider. Make sure you discuss any questions you have with your health care provider. Document Released: 02/17/2009 Document Revised: 09/29/2015 Document Reviewed: 05/28/2014 Elsevier Interactive Patient Education  2017 [employer].

## 2023-01-28 ENCOUNTER — Encounter (HOSPITAL_BASED_OUTPATIENT_CLINIC_OR_DEPARTMENT_OTHER): Payer: Self-pay

## 2023-01-28 ENCOUNTER — Ambulatory Visit (HOSPITAL_BASED_OUTPATIENT_CLINIC_OR_DEPARTMENT_OTHER)
Admission: RE | Admit: 2023-01-28 | Discharge: 2023-01-28 | Disposition: A | Discharge status: Home / Self Care | Payer: Medicare Other | Source: Ambulatory Visit | Attending: Family Medicine | Admitting: Family Medicine

## 2023-01-28 DIAGNOSIS — Z1231 Encounter for screening mammogram for malignant neoplasm of breast: Secondary | ICD-10-CM | POA: Diagnosis present

## 2023-01-30 ENCOUNTER — Ambulatory Visit: Payer: Medicare Other | Admitting: Clinical

## 2023-01-30 DIAGNOSIS — F331 Major depressive disorder, recurrent, moderate: Secondary | ICD-10-CM | POA: Diagnosis not present

## 2023-01-30 DIAGNOSIS — F411 Generalized anxiety disorder: Secondary | ICD-10-CM

## 2023-01-30 NOTE — Progress Notes (Signed)
Diagnosis: F33.1, Major Depression, Recurrent, Moderate,  F41.1 Time of session: 3:03 pm- 3:58 pm CPT Code: 66063K (in person)  Gail Fuentes was seen in person for individual therapy. She reported a difficult time since her last session, due in large part to her husband's possible cognitive decline. Therapist encouraged her to seek help and worked with her to consider how to access it. Therapist also engaged her in review and update of her treatment plan. She provided input into and verbal consent to all updates. She is scheduled to be seen again in two weeks.  Treatment Plan Client Abilities/Strengths  Gail Fuentes presented as motivated to engage in therapy and able to describe her challenges.  Client Treatment Preferences  Gail Fuentes prefers in-person appointments  Client Statement of Needs  Gail Fuentes is seeking cognitive behavioral therapy to manage symptoms of depression and anxiety.  Treatment Level  Biweekly  Symptoms  Anxiety: Excessive worry, sleep irregularity, difficulty relaxing (Status: maintained). Depression:  Depressed mood, lack of motivation, withdrawal from previously enjoyed activities, hopelessness  (Status: maintained).  Problems Addressed  New Description, New Description  Goals 1. Gail Fuentes has a history of recurrent depression Objective Gail Fuentes will increase self esteem and self compassion Target Date: 2024-01-17 Frequency: Daily Progress: 50 Modality: individual Objective Gail Fuentes will reduce anxious planning behavior and accept her personal limitations Progress: 50% Target Date: 2024-01-17 Signed by: Charlyne Mom, PhD Version 2 Signed on: 2021-03-16 11:22:00 -0500 Generated on: 2022-12-07T15:59:08Z [name]) Gail Fuentes DOB: 1945/12/12  Objective Gail Fuentes will improve her overall mood and sense of well-being Target Date: 2024-01-17 Frequency: Biweekly Progress: 50 Modality: individual Related Interventions 1. Therapist will provide Gail Fuentes with  opportunities to process her experiences in session 2. Therapist will help [name] to identify and disengage from maladaptive thought and behavior  patterns using CBT based strategies 3. Therapist will engage Gail Fuentes in behavior activation, including incorporation of structure,  pleasant events, and mastery events into her routine 4. Therapist will provide referrals for additional resources as appropriate  2. Gail Fuentes has difficulty setting boundaries in relationships and  prioritizing self-care Objective Gail Fuentes will develop strategies to support healthy communication and boundary setting in relationships Target Date: 2024-01-17 Frequency: Biweekly Progress: 50 Modality: individual Related Interventions 1. Therapist will provide Gail Fuentes with communication strategies and opportunities to practice,  including writing down her thoughts, in-session processing of interactions, and in-vivo role  plays Diagnosis Axis  none 300.02 (Generalized anxiety disorder) - Open - [Signifier: n/a]  Axis  none 296.32 (Major depressive affective disorder, recurrent episode, moderate) - Open -  [Signifier: n/a]  Signed by: Charlyne Mom, PhD Version 2 Signed on: 2021-03-16 11:22:00 -0500 Generated on: 2022-12-07T15:59:08Z [name] DOB: 09-Nov-1945 Provider: Charlyne Mom, PhD Created on: 2021-03-16 11:10:24 -0500 [facility] [name] [place], [place] [address] [phone] Conditions For Discharge Achievement of treatment goals and objective  Presenting Problem Gail Fuentes reports a history of recurrent depression coupled with bouts of anxiety that has kept her in and out of therapy for the past 30 years. She also described herself has having a "savior complex" that can make it difficult to set healthy boundaries and take care of herself.  Symptoms depressed mood, excessive worry, lack of motivation, feelings of hopelessness about the future. Suicidal  ideation and intent were denied in risk assessment History of Problem  Gail Fuentes shared that she has experienced depression on and off for much of her life. Most recently, depression and anxiety has centered on her son and grandson.  Recent Trigger  Gail Fuentes shared that her self care especially suffered during COVID, and symptoms of depression have increased as she has reflected upon her age and the next segment of her life. This has also triggered thoughts and fears related to the end of her parents' lives.  Marital and Family Information  Present family concerns/problems: Gail Fuentes frequently worries for her son and grandson.  Strengths/resources in the family/friends: Gail Fuentes described a supportive relationship with her husband.  Marital/sexual history patterns: Gail Fuentes has been married for over 50 years and reported she rarely fights or argues with her husband. However, she also shared that both are conflict avoidant. She has been increasingly concerned about her husband's cognitive functioning in 2023 and 2024. Family of Origin  Problems in family of origin: Gail Fuentes is the oldest of 4. Her father left the family for extended periods of time, leaving Gail Fuentes with significant responsibilities in terms of helping her mother manage the household. When she was 20, she became pregnant, and her father sent her away from the home. She gave this baby up for adoption and has never told her son about this, although her husband knows.  Family background / ethnic factors: Gail Fuentes reported that she lived in a household with significant financial struggles growing up.  No needs/concerns related to ethnicity reported when asked: No  Education/Vocation  Interpersonal concerns/problems: Gail Fuentes has withdrawn socially since COVID, but reported that she continues to enjoy participating in activities with her church.  Personal strengths: Gail Fuentes presented as motivated to engage in therapy, and has found it helpful in the past.   Military/work problems/concerns: None noted.  Leisure Activities/Daily Functioning  decreased interest  Legal Status  No Legal Problems  Medical/Nutritional Concerns  no problems  Comments: Gail Fuentes is prescribed prozac and synthroid.  Substance use/abuse/dependence  unspecified  Comments: None reported  Religion/Spirituality  believes in god  Other  General Behavior: cooperative  Attire: appropriate  Gait: normal  Motor Activity: normal  Stream of Thought - Productivity: spontaneous  Stream of thought - Progression: normal  Stream of thought - Language: normal  Emotional tone and reactions - Mood: normal  Emotional tone and reactions - Affect: appropriate  Mental trend/Content of thoughts - Perception: normal  Mental trend/Content of thoughts - Orientation: normal  Mental trend/Content of thoughts - Memory: normal  Mental trend/Content of thoughts - General knowledge: consistent with education  Insight: good  Judgment: good       [name], PhD               [name], PhD

## 2023-02-04 NOTE — Progress Notes (Unsigned)
[facility] [address], [address] [place], [place] [address] [phone]  Date:  02/07/2023   Name:  Gail Fuentes   DOB:  November 29, 1945   MRN:  [id number]  PCP:  Pearline Cables, MD    Chief Complaint: yearly OV (Concerns/ questions: Discuss- Fosamax, Levothyroxine, Prozac. //)   History of Present Illness:  Gail Fuentes is a 77 y.o. very pleasant female patient who presents with the following:  Patient is seen today for follow-up Most recent visit with myself was about 1 year ago History of cardiomegaly, hypertension, sleep apnea, hypothyroidism, hyperlipidemia, vitamin D deficiency and osteoporosis, mild pre-diabetes    She sees Dr. Elvera Lennox for her hyperparathyroidism-most recent visit in July of this year.  She underwent a parathyroidectomy in 2014-her calcium has looked okay At this time Dr. Elvera Lennox recommends annual calcium levels She is also taking Fosamax for osteoporosis-recent bone density scan showed a T-score of -3; it looks to me like she is not responding very well to her Fosamax.  Dr. Elvera Lennox has discussed some other options with her but as of she has not want to change  She is seeing a counselor regularly She notes she is under more stress.  Her husband is having some heart problems, she oftentimes feels overwhelmed and like she wants to cry but just cannot  Shingrix-recommended Flu shot done for the year Recommend COVID booster  She is having left hip pain- she thinks she may have injured her lower back and it is causing pain down her left leg  She feels like her thigh muscle is cramping She has noted this for 6- 8 weeks No recent injury to her hip or back that she can recall She is using tylenol only- helps some of the time  She has noted some weakness of the leg but not numbness   Lab Results  Component Value Date   TSH 0.89 11/27/2022      Patient Active Problem List   Diagnosis Date  Noted   Dry mouth 10/14/2019   Abnormal sense of taste 10/14/2019   Closed nondisplaced fracture of styloid process of right radius 10/30/2018   OSA (obstructive sleep apnea) 05/27/2018   Musculoskeletal neck pain 12/21/2015   Referred otalgia of right ear 12/21/2015   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   History of hyperparathyroidism 08/24/2014   Obesity (BMI 30-39.9) 10/08/2013   HTN (hypertension) 07/13/2013   Elevated blood pressure reading without diagnosis of hypertension 06/29/2013   Osteoporosis 11/14/2012   Acute iritis of both eyes 10/17/2012   Thyroid nodule 10/17/2012   Cardiomegaly 02/20/2012   DISTURBANCES OF SENSATION OF SMELL AND TASTE 04/19/2010   Vitamin D deficiency 12/24/2008   ANXIETY 12/24/2008   DEPRESSION 12/24/2008   BENIGN PAROXYSMAL POSITIONAL VERTIGO 12/24/2008   HYPERGLYCEMIA 12/24/2008   Nonspecific abnormal electrocardiogram (ECG) (EKG) 12/24/2008   Hyperlipidemia 07/23/2007   Hypothyroidism 07/22/2007   GERD 07/22/2007   DIVERTICULITIS, HX OF 07/22/2007    Past Medical History:  Diagnosis Date   Abnormal EKG    per patient- states evaluated by Dr Daleen Squibb in the past   Allergy    sneezing  late Fall   Anemia 01/23/12   H/H 11.7/35   Anxiety state, unspecified    Arthritis    Benign paroxysmal positional vertigo    Depression    Depressive disorder, not elsewhere classified    Diverticulosis    Esophageal reflux    Esophageal  stricture    Gallstones    Heart murmur    Hiatal hernia    History of hyperparathyroidism    History of UTI    HTN (hypertension)    Hyperlipidemia    OSA (obstructive sleep apnea) 05/27/2018   Other abnormal glucose    Personal history of other diseases of digestive system    12-15 since 1990, no recurrence 2006   PONV (postoperative nausea and vomiting)    Unspecified hypothyroidism    Vitamin D deficiency     Past Surgical History:  Procedure Laterality Date   CATARACT EXTRACTION, BILATERAL  Bilateral 12/06/2019   [facility]   CESAREAN SECTION  05/08/1975   CHOLECYSTECTOMY  05/07/2002   COLONOSCOPY  12/06/2003   [facility]   DENTAL SURGERY  1962, 2012   Age 77-Abscessed Teeth Extracted    PARATHYROIDECTOMY Left 02/13/2013   Procedure: LEFT INFERIOR PARATHYROIDECTOMY with frozen section ;  Surgeon: Velora Heckler, MD;  Location: [facility];  Service: General;  Laterality: Left;   TOTAL ABDOMINAL HYSTERECTOMY W/ BILATERAL SALPINGOOPHORECTOMY  05/08/2003   Ovarian Cyst    Social History   Tobacco Use   Smoking status: Former    Current packs/day: 0.00    Types: Cigarettes    Quit date: 05/07/1973    Years since quitting: 49.7   Smokeless tobacco: Never   Tobacco comments:    Smoked 1968-1975 up to 2 ppd  Vaping Use   Vaping status: Never Used  Substance Use Topics   Alcohol use: Yes    Alcohol/week: 14.0 standard drinks of alcohol    Types: 14 Standard drinks or equivalent per week    Comment: 2 nightly wines   Drug use: No    Family History  Problem Relation Age of Onset   Coronary artery disease Father    Heart attack Father 51   COPD Father    Diabetes Mother    Dysrhythmia Mother    Depression Mother    COPD Mother    Colon polyps Mother    Kidney disease Mother    Prostate cancer Brother    Other Brother        Wegener's in sinus; [facility]   Heart disease Brother    Arthritis Brother    Hypertension Brother    Prostate cancer Brother    Cancer Brother        Soft tissue in the face/temple   Stroke Maternal Aunt        >65   Stroke Maternal Grandmother        >65   Stroke Maternal Grandfather 47    Allergies  Allergen Reactions   Augmentin [Amoxicillin-Pot Clavulanate] Rash    Medication list has been reviewed and updated.  Current Outpatient Medications on File Prior to Visit  Medication Sig Dispense Refill   alendronate (FOSAMAX) 70 MG tablet TAKE 1 TABLET (70 MG TOTAL) BY MOUTH EVERY 7 DAYS WITH FULL GLASS WATER ON EMPTY STOMACH 12 tablet 3    ALPRAZolam (XANAX) 0.5 MG tablet TAKE 0.5-1 TABLETS (0.25-0.5 MG TOTAL) BY MOUTH 2 (TWO) TIMES DAILY AS NEEDED FOR ANXIETY. 30 tablet 0   Cholecalciferol (VITAMIN D3) 2000 UNITS TABS Take 2 tablets by mouth daily.      cyanocobalamin 100 MCG tablet Take 100 mcg by mouth daily.     ezetimibe (ZETIA) 10 MG tablet Take 1 tablet (10 mg total) by mouth daily. 90 tablet 3   FLUoxetine (PROZAC) 40 MG capsule Take 1 capsule (40 mg total)  by mouth daily. 90 capsule 0   levothyroxine (SYNTHROID) 75 MCG tablet Take 1 tablet (75 mcg total) by mouth daily. 90 tablet 3   losartan (COZAAR) 50 MG tablet TAKE 1 TABLET BY MOUTH EVERY DAY 90 tablet 1   omeprazole (PRILOSEC) 20 MG capsule Take 20 mg by mouth daily.     No current facility-administered medications on file prior to visit.    Review of Systems:  As per HPI- otherwise negative.   Physical Examination: Vitals:   02/07/23 0939  BP: 118/72  Pulse: 71  Resp: 18  Temp: 97.7 F (36.5 C)  SpO2: 97%   Vitals:   02/07/23 0939  Weight: 190 lb (86.2 kg)  Height: 5\' 5"  (1.651 m)   Body mass index is 31.62 kg/m. Ideal Body Weight: Weight in (lb) to have BMI = 25: 149.9  GEN: no acute distress.   Obese, looks well  HEENT: Atraumatic, Normocephalic.  Ears and Nose: No external deformity. CV: RRR, No M/G/R. No JVD. No thrill. No extra heart sounds. PULM: CTA B, no wheezes, crackles, rhonchi. No retractions. No resp. distress. No accessory muscle use. ABD: S, NT, ND EXTR: No c/c/e PSYCH: Normally interactive. Conversant.  Left lower back: Patient indicates her left lower back and left sciatic notch as the area of concern.  Thoracolumbar flexion is reduced, extension is normal.  Normal bilateral lower extremity strength, sensation reflex.  Negative straight leg raise  Assessment and Plan: Mixed hyperlipidemia - Plan: Lipid panel  Essential hypertension - Plan: CBC, Comprehensive metabolic panel, losartan (COZAAR) 50 MG tablet  Depression,  recurrent (HCC)  History of hyperparathyroidism - Plan: Comprehensive metabolic panel  Screening for diabetes mellitus - Plan: Hemoglobin A1c  Sciatica, left side - Plan: predniSONE (DELTASONE) 20 MG tablet, DG Hip Unilat W OR W/O Pelvis 2-3 Views Left, DG Lumbar Spine Complete  ANXIETY - Plan: FLUoxetine (PROZAC) 20 MG capsule  Patient seen today for follow-up.  Lab work is pending as above She notes feeling more stressed and down recently.  No concerns about self-harm.  She would like to try increasing her fluoxetine instead of changing medication at this time.  Will have her go up to 60 mg, asked her let me know how this works for her in about 1 month.  If not seeing improvement at that time we may try different drug  Gave her a 10-day course of prednisone for back pain-suspect sciatica versus a lumbar disc.  I asked her to let me know how this works for her Signed Abbe Amsterdam, MD  Received labs as below, message to patient  Results for orders placed or performed in visit on 02/07/23  CBC  Result Value Ref Range   WBC 7.5 4.0 - 10.5 K/uL   RBC 4.39 3.87 - 5.11 Mil/uL   Platelets 317.0 150.0 - 400.0 K/uL   Hemoglobin 13.5 12.0 - 15.0 g/dL   HCT 85.4 62.7 - 03.5 %   MCV 94.8 78.0 - 100.0 fl   MCHC 32.5 30.0 - 36.0 g/dL   RDW 00.9 38.1 - 82.9 %  Comprehensive metabolic panel  Result Value Ref Range   Sodium 139 135 - 145 mEq/L   Potassium 3.8 3.5 - 5.1 mEq/L   Chloride 104 96 - 112 mEq/L   CO2 27 19 - 32 mEq/L   Glucose, Bld 119 (H) 70 - 99 mg/dL   BUN 7 6 - 23 mg/dL   Creatinine, Ser 9.37 0.40 - 1.20 mg/dL   Total Bilirubin  0.5 0.2 - 1.2 mg/dL   Alkaline Phosphatase 60 39 - 117 U/L   AST 20 0 - 37 U/L   ALT 12 0 - 35 U/L   Total Protein 6.2 6.0 - 8.3 g/dL   Albumin 3.8 3.5 - 5.2 g/dL   GFR 16.10 >96.04 mL/min   Calcium 9.1 8.4 - 10.5 mg/dL  Hemoglobin V4U  Result Value Ref Range   Hgb A1c MFr Bld 5.6 4.6 - 6.5 %  Lipid panel  Result Value Ref Range    Cholesterol 206 (H) 0 - 200 mg/dL   Triglycerides 98.1 0.0 - 149.0 mg/dL   HDL 19.14 >78.29 mg/dL   VLDL 56.2 0.0 - 13.0 mg/dL   LDL Cholesterol 865 (H) 0 - 99 mg/dL   Total CHOL/HDL Ratio 2    NonHDL 117.74

## 2023-02-04 NOTE — Patient Instructions (Incomplete)
It was great to see you again today Recommend shingles vaccine series, COVID booster this fall if not done already I will be in touch with your labs

## 2023-02-07 ENCOUNTER — Encounter: Payer: Self-pay | Admitting: Family Medicine

## 2023-02-07 ENCOUNTER — Ambulatory Visit (HOSPITAL_BASED_OUTPATIENT_CLINIC_OR_DEPARTMENT_OTHER)
Admission: RE | Admit: 2023-02-07 | Discharge: 2023-02-07 | Disposition: A | Discharge status: Home / Self Care | Payer: Medicare Other | Source: Ambulatory Visit | Attending: Family Medicine | Admitting: Family Medicine

## 2023-02-07 ENCOUNTER — Ambulatory Visit (INDEPENDENT_AMBULATORY_CARE_PROVIDER_SITE_OTHER): Payer: Medicare Other | Admitting: Family Medicine

## 2023-02-07 VITALS — BP 118/72 | HR 71 | Temp 97.7°F | Resp 18 | Ht 65.0 in | Wt 190.0 lb

## 2023-02-07 DIAGNOSIS — F339 Major depressive disorder, recurrent, unspecified: Secondary | ICD-10-CM | POA: Diagnosis not present

## 2023-02-07 DIAGNOSIS — M5432 Sciatica, left side: Secondary | ICD-10-CM

## 2023-02-07 DIAGNOSIS — E782 Mixed hyperlipidemia: Secondary | ICD-10-CM | POA: Diagnosis not present

## 2023-02-07 DIAGNOSIS — Z131 Encounter for screening for diabetes mellitus: Secondary | ICD-10-CM | POA: Diagnosis not present

## 2023-02-07 DIAGNOSIS — Z8639 Personal history of other endocrine, nutritional and metabolic disease: Secondary | ICD-10-CM

## 2023-02-07 DIAGNOSIS — F411 Generalized anxiety disorder: Secondary | ICD-10-CM

## 2023-02-07 DIAGNOSIS — I10 Essential (primary) hypertension: Secondary | ICD-10-CM

## 2023-02-07 LAB — LIPID PANEL
Cholesterol: 206 mg/dL — ABNORMAL HIGH (ref 0–200)
HDL: 88.3 mg/dL (ref >39.00)
LDL Cholesterol: 101 mg/dL — ABNORMAL HIGH (ref 0–99)
NonHDL: 117.74
Total CHOL/HDL Ratio: 2
Triglycerides: 85 mg/dL (ref 0.0–149.0)
VLDL: 17 mg/dL (ref 0.0–40.0)

## 2023-02-07 LAB — CBC
HCT: 41.6 % (ref 36.0–46.0)
Hemoglobin: 13.5 g/dL (ref 12.0–15.0)
MCHC: 32.5 g/dL (ref 30.0–36.0)
MCV: 94.8 fL (ref 78.0–100.0)
Platelets: 317 10*3/uL (ref 150.0–400.0)
RBC: 4.39 Mil/uL (ref 3.87–5.11)
RDW: 13.3 % (ref 11.5–15.5)
WBC: 7.5 10*3/uL (ref 4.0–10.5)

## 2023-02-07 LAB — COMPREHENSIVE METABOLIC PANEL
ALT: 12 U/L (ref 0–35)
AST: 20 U/L (ref 0–37)
Albumin: 3.8 g/dL (ref 3.5–5.2)
Alkaline Phosphatase: 60 U/L (ref 39–117)
BUN: 7 mg/dL (ref 6–23)
CO2: 27 meq/L (ref 19–32)
Calcium: 9.1 mg/dL (ref 8.4–10.5)
Chloride: 104 meq/L (ref 96–112)
Creatinine, Ser: 0.9 mg/dL (ref 0.40–1.20)
GFR: 62.32 mL/min (ref >60.00)
Glucose, Bld: 119 mg/dL — ABNORMAL HIGH (ref 70–99)
Potassium: 3.8 meq/L (ref 3.5–5.1)
Sodium: 139 meq/L (ref 135–145)
Total Bilirubin: 0.5 mg/dL (ref 0.2–1.2)
Total Protein: 6.2 g/dL (ref 6.0–8.3)

## 2023-02-07 LAB — HEMOGLOBIN A1C: Hgb A1c MFr Bld: 5.6 % (ref 4.6–6.5)

## 2023-02-07 MED ORDER — PREDNISONE 20 MG PO TABS: ORAL_TABLET | ORAL | 0 refills | Status: DC

## 2023-02-07 MED ORDER — FLUOXETINE HCL 20 MG PO CAPS
60.0000 mg | ORAL_CAPSULE | Freq: Every day | ORAL | 3 refills | Status: DC
Start: 2023-02-07 — End: 2024-01-22

## 2023-02-07 MED ORDER — LOSARTAN POTASSIUM 50 MG PO TABS
50.0000 mg | ORAL_TABLET | Freq: Every day | ORAL | 3 refills | Status: DC
Start: 2023-02-07 — End: 2024-02-13

## 2023-02-15 ENCOUNTER — Ambulatory Visit: Payer: Medicare Other | Admitting: Clinical

## 2023-02-26 ENCOUNTER — Telehealth: Payer: Self-pay | Admitting: Family Medicine

## 2023-02-26 ENCOUNTER — Encounter: Payer: Self-pay | Admitting: Family Medicine

## 2023-02-26 NOTE — Telephone Encounter (Signed)
Please see below.

## 2023-02-26 NOTE — Telephone Encounter (Signed)
I called radiology and asked them to expedite

## 2023-02-26 NOTE — Telephone Encounter (Signed)
Pt called to follow up on results status from her x rays. After reviewing chart, advised pt that the radiologist had not read the images yet due to the shortage in radiologists. Advised a message would be sent back to check into the status of them as the images were taken 19 days ago on 10.3.24.

## 2023-03-01 ENCOUNTER — Ambulatory Visit (INDEPENDENT_AMBULATORY_CARE_PROVIDER_SITE_OTHER): Payer: Medicare Other | Admitting: Clinical

## 2023-03-01 DIAGNOSIS — F411 Generalized anxiety disorder: Secondary | ICD-10-CM | POA: Diagnosis not present

## 2023-03-01 DIAGNOSIS — F331 Major depressive disorder, recurrent, moderate: Secondary | ICD-10-CM

## 2023-03-01 NOTE — Progress Notes (Signed)
Diagnosis: F33.1, Major Depression, Recurrent, Moderate,  F41.1 Time of session: 10:03 am- 10:58 am CPT Code: 69629B (in person)  Gail Fuentes was seen in person for individual therapy. She reflected upon issues that had arisen with her husband's heart which had resulted in multiple doctor's appointments since her last visit, and a plan for him to undergo heart surgery. Therapist offered an opportunity to process, encouraging Gail Fuentes to look for ways to access moments of enjoyment amidst the stress. She also reflected that the experience has brought her and her husband closer. She is scheduled to be seen again in two weeks.  Treatment Plan Client Abilities/Strengths  Gail Fuentes presented as motivated to engage in therapy and able to describe her challenges.  Client Treatment Preferences  Gail Fuentes prefers in-person appointments  Client Statement of Needs  Gail Fuentes is seeking cognitive behavioral therapy to manage symptoms of depression and anxiety.  Treatment Level  Biweekly  Symptoms  Anxiety: Excessive worry, sleep irregularity, difficulty relaxing (Status: maintained). Depression:  Depressed mood, lack of motivation, withdrawal from previously enjoyed activities, hopelessness  (Status: maintained).  Problems Addressed  New Description, New Description  Goals 1. Gail Fuentes has a history of recurrent depression Objective Gail Fuentes will increase self esteem and self compassion Target Date: 2024-01-17 Frequency: Daily Progress: 50 Modality: individual Objective Gail Fuentes will reduce anxious planning behavior and accept her personal limitations Progress: 50% Target Date: 2024-01-17 Signed by: Charlyne Mom, PhD Version 2 Signed on: 2021-03-16 11:22:00 -0500 Generated on: 2022-12-07T15:59:08Z Client: [name]) Gail Fuentes DOB: Oct 09, 1945  Objective Gail Fuentes will improve her overall mood and sense of well-being Target Date: 2024-01-17 Frequency: Biweekly Progress: 50 Modality:  individual Related Interventions 1. Therapist will provide Victorya with opportunities to process her experiences in session 2. Therapist will help [name] to identify and disengage from maladaptive thought and behavior  patterns using CBT based strategies 3. Therapist will engage Gail Fuentes in behavior activation, including incorporation of structure,  pleasant events, and mastery events into her routine 4. Therapist will provide referrals for additional resources as appropriate  2. Gail Fuentes has difficulty setting boundaries in relationships and  prioritizing self-care Objective Gail Fuentes will develop strategies to support healthy communication and boundary setting in relationships Target Date: 2024-01-17 Frequency: Biweekly Progress: 50 Modality: individual Related Interventions 1. Therapist will provide Gail Fuentes with communication strategies and opportunities to practice,  including writing down her thoughts, in-session processing of interactions, and in-vivo role  plays Diagnosis Axis  none 300.02 (Generalized anxiety disorder) - Open - [Signifier: n/a]  Axis  none 296.32 (Major depressive affective disorder, recurrent episode, moderate) - Open -  [Signifier: n/a]  Signed by: Charlyne Mom, PhD Version 2 Signed on: 2021-03-16 11:22:00 -0500 Generated on: 2022-12-07T15:59:08Z [name] DOB: Aug 07, 1945 Provider: Charlyne Mom, PhD Created on: 2021-03-16 11:10:24 -0500 [facility] [name] [place], [place] [address] [phone] Conditions For Discharge Achievement of treatment goals and objective  Presenting Problem Gail Fuentes reports a history of recurrent depression coupled with bouts of anxiety that has kept her in and out of therapy for the past 30 years. She also described herself has having a "savior complex" that can make it difficult to set healthy boundaries and take care of herself.  Symptoms depressed mood, excessive worry,  lack of motivation, feelings of hopelessness about the future. Suicidal ideation and intent were denied in risk assessment History of Problem  Gail Fuentes shared that she has experienced depression on and off for much of her life. Most recently, depression and anxiety has centered on  her son and grandson.  Recent Trigger  Gail Fuentes shared that her self care especially suffered during COVID, and symptoms of depression have increased as she has reflected upon her age and the next segment of her life. This has also triggered thoughts and fears related to the end of her parents' lives.  Marital and Family Information  Present family concerns/problems: Gail Fuentes frequently worries for her son and grandson.  Strengths/resources in the family/friends: Gail Fuentes described a supportive relationship with her husband.  Marital/sexual history patterns: Gail Fuentes has been married for over 50 years and reported she rarely fights or argues with her husband. However, she also shared that both are conflict avoidant. She has been increasingly concerned about her husband's cognitive functioning in 2023 and 2024. Family of Origin  Problems in family of origin: Gail Fuentes is the oldest of 4. Her father left the family for extended periods of time, leaving Gail Fuentes with significant responsibilities in terms of helping her mother manage the household. When she was 72, she became pregnant, and her father sent her away from the home. She gave this baby up for adoption and has never told her son about this, although her husband knows.  Family background / ethnic factors: Gail Fuentes reported that she lived in a household with significant financial struggles growing up.  No needs/concerns related to ethnicity reported when asked: No  Education/Vocation  Interpersonal concerns/problems: Gail Fuentes has withdrawn socially since COVID, but reported that she continues to enjoy participating in activities with her church.  Personal strengths: Gail Fuentes presented as  motivated to engage in therapy, and has found it helpful in the past.  Military/work problems/concerns: None noted.  Leisure Activities/Daily Functioning  decreased interest  Legal Status  No Legal Problems  Medical/Nutritional Concerns  no problems  Comments: Gail Fuentes is prescribed prozac and synthroid.  Substance use/abuse/dependence  unspecified  Comments: None reported  Religion/Spirituality  believes in god  Other  General Behavior: cooperative  Attire: appropriate  Gait: normal  Motor Activity: normal  Stream of Thought - Productivity: spontaneous  Stream of thought - Progression: normal  Stream of thought - Language: normal  Emotional tone and reactions - Mood: normal  Emotional tone and reactions - Affect: appropriate  Mental trend/Content of thoughts - Perception: normal  Mental trend/Content of thoughts - Orientation: normal  Mental trend/Content of thoughts - Memory: normal  Mental trend/Content of thoughts - General knowledge: consistent with education  Insight: good  Judgment: good              [name], PhD               [name], PhD

## 2023-03-12 ENCOUNTER — Ambulatory Visit (INDEPENDENT_AMBULATORY_CARE_PROVIDER_SITE_OTHER): Payer: Medicare Other | Admitting: Clinical

## 2023-03-12 DIAGNOSIS — F411 Generalized anxiety disorder: Secondary | ICD-10-CM | POA: Diagnosis not present

## 2023-03-12 DIAGNOSIS — F331 Major depressive disorder, recurrent, moderate: Secondary | ICD-10-CM | POA: Diagnosis not present

## 2023-03-12 NOTE — Progress Notes (Signed)
Diagnosis: F33.1, Major Depression, Recurrent, Moderate,  F41.1 Time of session: 1:01 pm- 1:54 pm CPT Code: 30865H (in person)  Gail Fuentes was seen in person for individual therapy. She reported that her husband's surgery had been scheduled for 11/19, and she had successfully talked to him about mentioning his memory issues to the doctor. He had been receptive, and had brought this up. However, Gail Fuentes shared ongoing feelings of doom, and a sense that "the world will blow up next week." Her prozac dosage was recently increased to 60mg . Therapist encouraged her to consider what she would actually want to do if the world were going to blow up (go to the mountains for a picnic) and encouraged her to consider how she might enact some of these plans in her day-to-day. She is scheduled to be seen again on 11/22.   Treatment Plan Client Abilities/Strengths  Gail Fuentes presented as motivated to engage in therapy and able to describe her challenges.  Client Treatment Preferences  Gail Fuentes prefers in-person appointments  Client Statement of Needs  Gail Fuentes is seeking cognitive behavioral therapy to manage symptoms of depression and anxiety.  Treatment Level  Biweekly  Symptoms  Anxiety: Excessive worry, sleep irregularity, difficulty relaxing (Status: maintained). Depression:  Depressed mood, lack of motivation, withdrawal from previously enjoyed activities, hopelessness  (Status: maintained).  Problems Addressed  New Description, New Description  Goals 1. Gail Fuentes has a history of recurrent depression Objective Gail Fuentes will increase self esteem and self compassion Target Date: 2024-01-17 Frequency: Daily Progress: 50 Modality: individual Objective Gail Fuentes will reduce anxious planning behavior and accept her personal limitations Progress: 50% Target Date: 2024-01-17 Signed by: Gail Mom, PhD Version 2 Signed on: 2021-03-16 11:22:00 -0500 Generated on: 2022-12-07T15:59:08Z [name] [facility])  Gail Fuentes DOB: 1945/11/04  Objective [name] will improve her overall mood and sense of well-being Target Date: 2024-01-17 Frequency: Biweekly Progress: 50 Modality: individual Related Interventions 1. Therapist will provide Gail Fuentes with opportunities to process her experiences in session 2. Therapist will help Gail Fuentes to identify and disengage from maladaptive thought and behavior  patterns using CBT based strategies 3. Therapist will engage Gail Fuentes in behavior activation, including incorporation of structure,  pleasant events, and mastery events into her routine 4. Therapist will provide referrals for additional resources as appropriate  2. Gail Fuentes has difficulty setting boundaries in relationships and  prioritizing self-care Objective Gail Fuentes will develop strategies to support healthy communication and boundary setting in relationships Target Date: 2024-01-17 Frequency: Biweekly Progress: 50 Modality: individual Related Interventions 1. Therapist will provide Gail Fuentes with communication strategies and opportunities to practice,  including writing down her thoughts, in-session processing of interactions, and in-vivo role  plays Diagnosis Axis  none 300.02 (Generalized anxiety disorder) - Open - [Signifier: n/a]  Axis  none 296.32 (Major depressive affective disorder, recurrent episode, moderate) - Open -  [Signifier: n/a]  Signed by: Gail Mom, PhD Version 2 Signed on: 2021-03-16 11:22:00 -0500 Generated on: 2022-12-07T15:59:08Z [name] DOB: 03/06/1946 Provider: Charlyne Mom, PhD Created on: 2021-03-16 11:10:24 -0500  [facility] [name] [place], [place] [address] [phone] Conditions For Discharge Achievement of treatment goals and objective  Presenting Problem Gail Fuentes reports a history of recurrent depression coupled with bouts of anxiety that has kept her in and out of therapy for the past 30 years. She also  described herself has having a "savior complex" that can make it difficult to set healthy boundaries and take care of herself.  Symptoms depressed mood, excessive worry, lack of motivation, feelings of  hopelessness about the future. Suicidal ideation and intent were denied in risk assessment History of Problem  Gail Fuentes shared that she has experienced depression on and off for much of her life. Most recently, depression and anxiety has centered on her son and grandson.  Recent Trigger  Gail Fuentes shared that her self care especially suffered during COVID, and symptoms of depression have increased as she has reflected upon her age and the next segment of her life. This has also triggered thoughts and fears related to the end of her parents' lives.  Marital and Family Information  Present family concerns/problems: Gail Fuentes frequently worries for her son and grandson.  Strengths/resources in the family/friends: Gail Fuentes described a supportive relationship with her husband.  Marital/sexual history patterns: Gail Fuentes has been married for over 50 years and reported she rarely fights or argues with her husband. However, she also shared that both are conflict avoidant. She has been increasingly concerned about her husband's cognitive functioning in 2023 and 2024. Family of Origin  Problems in family of origin: Gail Fuentes is the oldest of 4. Her father left the family for extended periods of time, leaving Gail Fuentes with significant responsibilities in terms of helping her mother manage the household. When she was 75, she became pregnant, and her father sent her away from the home. She gave this baby up for adoption and has never told her son about this, although her husband knows.  Family background / ethnic factors: Gail Fuentes reported that she lived in a household with significant financial struggles growing up.  No needs/concerns related to ethnicity reported when asked: No  Education/Vocation  Interpersonal concerns/problems:  Gail Fuentes has withdrawn socially since COVID, but reported that she continues to enjoy participating in activities with her church.  Personal strengths: Gail Fuentes presented as motivated to engage in therapy, and has found it helpful in the past.  Military/work problems/concerns: None noted.  Leisure Activities/Daily Functioning  decreased interest  Legal Status  No Legal Problems  Medical/Nutritional Concerns  no problems  Comments: Gail Fuentes is prescribed prozac and synthroid.  Substance use/abuse/dependence  unspecified  Comments: None reported  Religion/Spirituality  believes in god  Other  General Behavior: cooperative  Attire: appropriate  Gait: normal  Motor Activity: normal  Stream of Thought - Productivity: spontaneous  Stream of thought - Progression: normal  Stream of thought - Language: normal  Emotional tone and reactions - Mood: normal  Emotional tone and reactions - Affect: appropriate  Mental trend/Content of thoughts - Perception: normal  Mental trend/Content of thoughts - Orientation: normal  Mental trend/Content of thoughts - Memory: normal  Mental trend/Content of thoughts - General knowledge: consistent with education  Insight: good  Judgment: good     [name], PhD               [name], PhD

## 2023-03-29 ENCOUNTER — Ambulatory Visit: Payer: Medicare Other | Admitting: Clinical

## 2023-03-29 DIAGNOSIS — F411 Generalized anxiety disorder: Secondary | ICD-10-CM | POA: Diagnosis not present

## 2023-03-29 DIAGNOSIS — F331 Major depressive disorder, recurrent, moderate: Secondary | ICD-10-CM | POA: Diagnosis not present

## 2023-03-29 NOTE — Progress Notes (Signed)
Diagnosis: F33.1, Major Depression, Recurrent, Moderate,  F41.1 Time of session: 11:01 am- 11:58 am CPT Code: 29562Z (in person)  Gail Fuentes was seen in person for individual therapy. She reported upon a challenging few weeks, during which her husband's surgery had been rescheduled after several hours of waiting in the hospital. She reflected upon concerns for his and other family members' health, and increased symptoms of depression, including a day where she had struggled to get up from the couch. Therapist engaged her in discussion of pleasant events, and she indicated a plan to schedule a facial with her sister. She is also looking forward to hosting thanksgiving. She is scheduled to be seen again in two weeks.    Treatment Plan Client Abilities/Strengths  Nancie presented as motivated to engage in therapy and able to describe her challenges.  Client Treatment Preferences  Kyan prefers in-person appointments  Client Statement of Needs  Delories is seeking cognitive behavioral therapy to manage symptoms of depression and anxiety.  Treatment Level  Biweekly  Symptoms  Anxiety: Excessive worry, sleep irregularity, difficulty relaxing (Status: maintained). Depression:  Depressed mood, lack of motivation, withdrawal from previously enjoyed activities, hopelessness  (Status: maintained).  Problems Addressed  New Description, New Description  Goals 1. Jemia has a history of recurrent depression Objective Lareine will increase self esteem and self compassion Target Date: 2024-01-17 Frequency: Daily Progress: 50 Modality: individual Objective Makani will reduce anxious planning behavior and accept her personal limitations Progress: 50% Target Date: 2024-01-17 Signed by: Charlyne Mom, PhD Version 2 Signed on: 2021-03-16 11:22:00 -0500 Generated on: 2022-12-07T15:59:08Z Client: [name] [facility]) Lonardo DOB: [birth year]  Objective Chystal will improve her overall mood and sense of  well-being Target Date: 2024-01-17 Frequency: Biweekly Progress: 50 Modality: individual Related Interventions 1. Therapist will provide Kemoni with opportunities to process her experiences in session 2. Therapist will help Lilan to identify and disengage from maladaptive thought and behavior  patterns using CBT based strategies 3. Therapist will engage Arleni in behavior activation, including incorporation of structure,  pleasant events, and mastery events into her routine 4. Therapist will provide referrals for additional resources as appropriate  2. Malaikah has difficulty setting boundaries in relationships and  prioritizing self-care Objective Seleana will develop strategies to support healthy communication and boundary setting in relationships Target Date: 2024-01-17 Frequency: Biweekly Progress: 50 Modality: individual Related Interventions 1. Therapist will provide Takasha with communication strategies and opportunities to practice,  including writing down her thoughts, in-session processing of interactions, and in-vivo role  plays Diagnosis Axis  none 300.02 (Generalized anxiety disorder) - Open - [Signifier: n/a]  Axis  none 296.32 (Major depressive affective disorder, recurrent episode, moderate) - Open -  [Signifier: n/a]  Signed by: Charlyne Mom, PhD Version 2 Signed on: 2021-03-16 11:22:00 -0500 Generated on: 2022-12-07T15:59:08Z Client: Arvilla Market DOB: 1945-07-29 Provider: Charlyne Mom, PhD Created on: 2021-03-16 11:10:24 -0500 [facility] [name] [place], [place] [address] [phone] Conditions For Discharge Achievement of treatment goals and objective  Presenting Problem [name] reports a history of recurrent depression coupled with bouts of anxiety that has kept her in and out of therapy for the past 30 years. She also described herself has having a "savior complex" that can make it difficult to set healthy  boundaries and take care of herself.  Symptoms depressed mood, excessive worry, lack of motivation, feelings of hopelessness about the future. Suicidal ideation and intent were denied in risk assessment History of Problem  [name] shared that she has  experienced depression on and off for much of her life. Most recently, depression and anxiety has centered on her son and grandson.  Recent Trigger  Kyran shared that her self care especially suffered during COVID, and symptoms of depression have increased as she has reflected upon her age and the next segment of her life. This has also triggered thoughts and fears related to the end of her parents' lives.  Marital and Family Information  Present family concerns/problems: [name] frequently worries for her son and grandson.  Strengths/resources in the family/friends: [name] described a supportive relationship with her husband.  Marital/sexual history patterns: [name] has been married for over 50 years and reported she rarely fights or argues with her husband. However, she also shared that both are conflict avoidant. She has been increasingly concerned about her husband's cognitive functioning in 2023 and 2024. Family of Origin  Problems in family of origin: [name] is the oldest of 4. Her father left the family for extended periods of time, leaving [name] with significant responsibilities in terms of helping her mother manage the household. When she was 63, she became pregnant, and her father sent her away from the home. She gave this baby up for adoption and has never told her son about this, although her husband knows.  Family background / ethnic factors: [name] reported that she lived in a household with significant financial struggles growing up.  No needs/concerns related to ethnicity reported when asked: No  Education/Vocation  Interpersonal concerns/problems: [name] has withdrawn socially since COVID, but reported that she continues to enjoy  participating in activities with her church.  Personal strengths: [name] presented as motivated to engage in therapy, and has found it helpful in the past.  Military/work problems/concerns: None noted.  Leisure Activities/Daily Functioning  decreased interest  Legal Status  No Legal Problems  Medical/Nutritional Concerns  no problems  Comments: [name] is prescribed prozac and synthroid.  Substance use/abuse/dependence  unspecified  Comments: None reported  Religion/Spirituality  believes in god  Other  General Behavior: cooperative  Attire: appropriate  Gait: normal  Motor Activity: normal  Stream of Thought - Productivity: spontaneous  Stream of thought - Progression: normal  Stream of thought - Language: normal  Emotional tone and reactions - Mood: normal  Emotional tone and reactions - Affect: appropriate  Mental trend/Content of thoughts - Perception: normal  Mental trend/Content of thoughts - Orientation: normal  Mental trend/Content of thoughts - Memory: normal  Mental trend/Content of thoughts - General knowledge: consistent with education  Insight: good  Judgment: good      [name], PhD               [name], PhD

## 2023-03-30 ENCOUNTER — Other Ambulatory Visit: Payer: Self-pay | Admitting: Family Medicine

## 2023-03-30 DIAGNOSIS — F339 Major depressive disorder, recurrent, unspecified: Secondary | ICD-10-CM

## 2023-04-08 ENCOUNTER — Ambulatory Visit (INDEPENDENT_AMBULATORY_CARE_PROVIDER_SITE_OTHER): Payer: Medicare Other | Admitting: Clinical

## 2023-04-08 DIAGNOSIS — F411 Generalized anxiety disorder: Secondary | ICD-10-CM | POA: Diagnosis not present

## 2023-04-08 DIAGNOSIS — F331 Major depressive disorder, recurrent, moderate: Secondary | ICD-10-CM | POA: Diagnosis not present

## 2023-04-08 NOTE — Progress Notes (Signed)
Diagnosis: F33.1, Major Depression, Recurrent, Moderate,  F41.1 Time of session: 10:01 am-10:58 am CPT Code: 09811B (in person)  Gail Fuentes was seen in person for individual therapy. She reflected upon Thanksgiving, which she had hosted upon short notice at her family's request. Therapist engaged her in discussion of how she might be able to allow herself to take a break. She is scheduled to be seen again in two weeks.    Treatment Plan Client Abilities/Strengths  Gail Fuentes presented as motivated to engage in therapy and able to describe her challenges.  Client Treatment Preferences  Gail Fuentes prefers in-person appointments  Client Statement of Needs  Gail Fuentes is seeking cognitive behavioral therapy to manage symptoms of depression and anxiety.  Treatment Level  Biweekly  Symptoms  Anxiety: Excessive worry, sleep irregularity, difficulty relaxing (Status: maintained). Depression:  Depressed mood, lack of motivation, withdrawal from previously enjoyed activities, hopelessness  (Status: maintained).  Problems Addressed  New Description, New Description  Goals 1. Gail Fuentes has a history of recurrent depression Objective Gail Fuentes will increase self esteem and self compassion Target Date: 2024-01-17 Frequency: Daily Progress: 50 Modality: individual Objective Gail Fuentes will reduce anxious planning behavior and accept her personal limitations Progress: 50% Target Date: 2024-01-17 Signed by: Gail Mom, PhD Version 2 Signed on: 2021-03-16 11:22:00 -0500 Generated on: 2022-12-07T15:59:08Z [name] [facility]) Gail Fuentes DOB: 01-Jan-1946  Objective Gail Fuentes will improve her overall mood and sense of well-being Target Date: 2024-01-17 Frequency: Biweekly Progress: 50 Modality: individual Related Interventions 1. Therapist will provide Gail Fuentes with opportunities to process her experiences in session 2. Therapist will help Gail Fuentes to identify and disengage from maladaptive thought and behavior   patterns using CBT based strategies 3. Therapist will engage Gail Fuentes in behavior activation, including incorporation of structure,  pleasant events, and mastery events into her routine 4. Therapist will provide referrals for additional resources as appropriate  2. Gail Fuentes has difficulty setting boundaries in relationships and  prioritizing self-care Objective Gail Fuentes will develop strategies to support healthy communication and boundary setting in relationships Target Date: 2024-01-17 Frequency: Biweekly Progress: 50 Modality: individual Related Interventions 1. Therapist will provide Gail Fuentes with communication strategies and opportunities to practice,  including writing down her thoughts, in-session processing of interactions, and in-vivo role  plays Diagnosis Axis  none 300.02 (Generalized anxiety disorder) - Open - [Signifier: n/a]  Axis  none 296.32 (Major depressive affective disorder, recurrent episode, moderate) - Open -  [Signifier: n/a]  Signed by: Gail Mom, PhD Version 2 Signed on: 2021-03-16 11:22:00 -0500 Generated on: 2022-12-07T15:59:08Z [name] DOB: 1946/03/17 Provider: Charlyne Mom, PhD Created on: 2021-03-16 11:10:24 -0500 [facility] [name] [place], [place] [address] [phone] Conditions For Discharge Achievement of treatment goals and objective  Presenting Problem Gail Fuentes reports a history of recurrent depression coupled with bouts of anxiety that has kept her in and out of therapy for the past 30 years. She also described herself has having a "savior complex" that can make it difficult to set healthy boundaries and take care of herself.  Symptoms depressed mood, excessive worry, lack of motivation, feelings of hopelessness about the future. Suicidal ideation and intent were denied in risk assessment History of Problem  Gail Fuentes that she has experienced depression on and off for much of her  life. Most recently, depression and anxiety has centered on her son and grandson.  Recent Trigger  [name] Fuentes that her self care especially suffered during COVID, and symptoms of depression have increased as she has reflected upon her  age and the next segment of her life. This has also triggered thoughts and fears related to the end of her parents' lives.  Marital and Family Information  Present family concerns/problems: Gail Fuentes frequently worries for her son and grandson.  Strengths/resources in the family/friends: Gail Fuentes described a supportive relationship with her husband.  Marital/sexual history patterns: Gail Fuentes has been married for over 50 years and reported she rarely fights or argues with her husband. However, she also Fuentes that both are conflict avoidant. She has been increasingly concerned about her husband's cognitive functioning in 2023 and 2024. Family of Origin  Problems in family of origin: Gail Fuentes is the oldest of 4. Her father left the family for extended periods of time, leaving Gail Fuentes with significant responsibilities in terms of helping her mother manage the household. When she was 42, she became pregnant, and her father sent her away from the home. She gave this baby up for adoption and has never told her son about this, although her husband knows.  Family background / ethnic factors: Gail Fuentes reported that she lived in a household with significant financial struggles growing up.  No needs/concerns related to ethnicity reported when asked: No  Education/Vocation  Interpersonal concerns/problems: Gail Fuentes has withdrawn socially since COVID, but reported that she continues to enjoy participating in activities with her church.  Personal strengths: Gail Fuentes presented as motivated to engage in therapy, and has found it helpful in the past.  Military/work problems/concerns: None noted.  Leisure Activities/Daily Functioning  decreased interest  Legal Status  No Legal Problems   Medical/Nutritional Concerns  no problems  Comments: Gail Fuentes is prescribed prozac and synthroid.  Substance use/abuse/dependence  unspecified  Comments: None reported  Religion/Spirituality  believes in god  Other  General Behavior: cooperative  Attire: appropriate  Gait: normal  Motor Activity: normal  Stream of Thought - Productivity: spontaneous  Stream of thought - Progression: normal  Stream of thought - Language: normal  Emotional tone and reactions - Mood: normal  Emotional tone and reactions - Affect: appropriate  Mental trend/Content of thoughts - Perception: normal  Mental trend/Content of thoughts - Orientation: normal  Mental trend/Content of thoughts - Memory: normal  Mental trend/Content of thoughts - General knowledge: consistent with education  Insight: good  Judgment: good          [name], PhD               [name], PhD

## 2023-04-19 ENCOUNTER — Ambulatory Visit: Payer: Medicare Other | Admitting: Clinical

## 2023-06-09 NOTE — Progress Notes (Unsigned)
Office Visit    Patient Name: Gail Fuentes Date of Encounter: 06/10/2023  PCP:  Pearline Cables, MD   [facility]  Cardiologist:  Verne Carrow, MD  Advanced Practice Provider:  No care team member to display Electrophysiologist:  None    HPI    Gail Fuentes is a 78 y.o. female with a past medical history significant for HTN, HLD, GERD, anxiety, depression, hyperparathyroidism presents today for annual follow-up appointment.  She was followed remotely in our office by Dr. Daleen Squibb for abnormal EKG.  Echocardiogram 2011 with normal LV function, no valvular issues.  She has been followed by Dr. Clifton James since January 2018.  She had reported occasional dyspnea.  EKG showed poor R wave progression in the precordial leads and possible inferior Q waves, all unchanged from 2009.  Echo February 2018 with normal LV systolic function, grade 1 DD, mild LVH, no valvular disease.  She was seen August 04, 2021 by Dr. Clifton James.  She denies chest pain, dyspnea, palpitations, lower extremity edema, orthopnea, PND.  Occasional dizziness but no near syncope or syncope.  Repeat echocardiogram was ordered which was unremarkable.  It was thought that her occasional dizziness was not due to her heart.  She had a CTA which revealed coronary calcium score of 12.7.  (35 percentile for age, race, and sex matched controls).  Recommend statin therapy for risk modification.  She was last seen by me 06/13/2022, she states that she is afraid of starting a statin because of side effects that friends have told her.  She recently started Fosamax a few months back and she was resistant for a long time.  Last few weeks she has felt like her heart was racing especially when she lays down at night.  Sometimes she does nap during the day and gets this sensation.  She has a lot of anxiety and with this comes palpitations.  She takes Prozac 40 mg daily and sees a therapist.  She does not think  that therapy is helping.  She tried Wellbutrin in the past and did not tolerate it.  She tells me she has been on Prozac for years.  She states the palpitations are infrequent and not very bothersome.  We discussed limiting caffeine and limiting alcohol.  Maintaining proper hydration.  She states that she has a glass of wine every other night but plans to cut it out after Valentine's Day since alcoholism runs in her family.    Reports no shortness of breath nor dyspnea on exertion. Reports no chest pain, pressure, or tightness. No edema, orthopnea, PND.   Today, she presents, with a history of high cholesterol and GERD for a routine follow-up. The patient had been prescribed Zetia for cholesterol management and had stopped taking it after initial labs showed minimal improvement. However, the patient resumed the medication after noticing a decrease in cholesterol levels during a visit with her primary care physician. The patient also takes Protonix for GERD management but has concerns about potential links to dementia. The patient reports needing to take the medication daily due to symptoms of regurgitation, particularly when lying on her left side. The patient also mentions having a hernia.  Reports no shortness of breath nor dyspnea on exertion. Reports no chest pain, pressure, or tightness. No edema, orthopnea, PND. Reports no palpitations.   Discussed the use of AI scribe software for clinical note transcription with the patient, who gave verbal consent to proceed.   Past Medical  History    Past Medical History:  Diagnosis Date   Abnormal EKG    per patient- states evaluated by Dr Daleen Squibb in the past   Allergy    sneezing  late Fall   Anemia 01/23/12   H/H 11.7/35   Anxiety state, unspecified    Arthritis    Benign paroxysmal positional vertigo    Depression    Depressive disorder, not elsewhere classified    Diverticulosis    Esophageal reflux    Esophageal stricture    Gallstones     Heart murmur    Hiatal hernia    History of hyperparathyroidism    History of UTI    HTN (hypertension)    Hyperlipidemia    OSA (obstructive sleep apnea) 05/27/2018   Other abnormal glucose    Personal history of other diseases of digestive system    12-15 since 1990, no recurrence 2006   PONV (postoperative nausea and vomiting)    Unspecified hypothyroidism    Vitamin D deficiency    Past Surgical History:  Procedure Laterality Date   CATARACT EXTRACTION, BILATERAL Bilateral 12/06/2019   [facility]   CESAREAN SECTION  05/08/1975   CHOLECYSTECTOMY  05/07/2002   COLONOSCOPY  12/06/2003   [facility]   DENTAL SURGERY  1962, 2012   Age 36-Abscessed Teeth Extracted    PARATHYROIDECTOMY Left 02/13/2013   Procedure: LEFT INFERIOR PARATHYROIDECTOMY with frozen section ;  Surgeon: Velora Heckler, MD;  Location: [facility];  Service: General;  Laterality: Left;   TOTAL ABDOMINAL HYSTERECTOMY W/ BILATERAL SALPINGOOPHORECTOMY  05/08/2003   Ovarian Cyst    Allergies  Allergies  Allergen Reactions   Augmentin [Amoxicillin-Pot Clavulanate] Rash     EKGs/Labs/Other Studies Reviewed:   The following studies were reviewed today:  08/2021 echocardiogram  IMPRESSIONS     1. Left ventricular ejection fraction, by estimation, is 60 to 65%. The  left ventricle has normal function. The left ventricle has no regional  wall motion abnormalities. Left ventricular diastolic parameters are  consistent with Grade I diastolic  dysfunction (impaired relaxation). Elevated left atrial pressure.   2. Right ventricular systolic function is normal. The right ventricular  size is mildly enlarged.   3. The mitral valve is normal in structure. Trivial mitral valve  regurgitation. No evidence of mitral stenosis.   4. The aortic valve has an indeterminant number of cusps. Aortic valve  regurgitation is not visualized. No aortic stenosis is present   Coronary calcium score 10/23 IMPRESSION: Coronary calcium score  of 12.7. This was 35 percentile for age-, race-, and sex-matched controls.  EKG:  EKG is  ordered today.  The ekg ordered today demonstrates sinus bradycardia with premature atrial complexes, 54 bpm  Recent Labs: 11/27/2022: TSH 0.89 02/07/2023: ALT 12; BUN 7; Creatinine, Ser 0.90; Hemoglobin 13.5; Platelets 317.0; Potassium 3.8; Sodium 139  Recent Lipid Panel    Component Value Date/Time   CHOL 206 (H) 02/07/2023 1016   CHOL 213 (H) 07/25/2022 0828   CHOL 236 (H) 10/12/2014 1046   TRIG 85.0 02/07/2023 1016   TRIG 109 10/12/2014 1046   TRIG 91 05/13/2006 1031   HDL 88.30 02/07/2023 1016   HDL 89 07/25/2022 0828   HDL 90 10/12/2014 1046   CHOLHDL 2 02/07/2023 1016   VLDL 17.0 02/07/2023 1016   LDLCALC 101 (H) 02/07/2023 1016   LDLCALC 112 (H) 07/25/2022 0828   LDLCALC 124 (H) 10/12/2014 1046   LDLDIRECT 142.7 12/27/2008 1035    Home Medications   Current  Meds  Medication Sig   ALPRAZolam (XANAX) 0.5 MG tablet TAKE 0.5-1 TABLETS (0.25-0.5 MG TOTAL) BY MOUTH 2 (TWO) TIMES DAILY AS NEEDED FOR ANXIETY.   Cholecalciferol (VITAMIN D3) 2000 UNITS TABS Take 2 tablets by mouth daily.    cyanocobalamin 100 MCG tablet Take 100 mcg by mouth daily.   FLUoxetine (PROZAC) 20 MG capsule Take 3 capsules (60 mg total) by mouth daily.   levothyroxine (SYNTHROID) 75 MCG tablet Take 1 tablet (75 mcg total) by mouth daily.   losartan (COZAAR) 50 MG tablet Take 1 tablet (50 mg total) by mouth daily.   omeprazole (PRILOSEC) 20 MG capsule Take 20 mg by mouth daily.   [DISCONTINUED] ezetimibe (ZETIA) 10 MG tablet Take 1 tablet (10 mg total) by mouth daily.     Review of Systems      All other systems reviewed and are otherwise negative except as noted above.  Physical Exam    VS:  BP 114/66   Pulse (!) 55   Ht 5\' 5"  (1.651 m)   Wt 188 lb (85.3 kg)   SpO2 96%   BMI 31.28 kg/m  , BMI Body mass index is 31.28 kg/m.  Wt Readings from Last 3 Encounters:  06/10/23 188 lb (85.3 kg)  02/07/23  190 lb (86.2 kg)  01/18/23 189 lb 6.4 oz (85.9 kg)     GEN: Well nourished, well developed, in no acute distress. HEENT: normal. Neck: Supple, no JVD, carotid bruits, or masses. Cardiac: RRR, no murmurs, rubs, or gallops. No clubbing, cyanosis, edema.  Radials/PT 2+ and equal bilaterally.  Respiratory:  Respirations regular and unlabored, clear to auscultation bilaterally. GI: Soft, nontender, nondistended. MS: No deformity or atrophy. Skin: Warm and dry, no rash. Neuro:  Strength and sensation are intact. Psych: Normal affect.  Assessment & Plan      1.  Hyperlipidemia LDL improved from 137 to 101 with Zetia. Patient had stopped taking Zetia for a period of time but has agreed to resume. -Resume Zetia. -Check lipid panel and liver function tests in three months.  2. Gastroesophageal Reflux Disease (GERD) Patient has been managing symptoms with over-the-counter omeprazole. Discussed potential link between proton pump inhibitors and dementia, and the importance of using the lowest effective dose. -Continue over-the-counter omeprazole at the lowest effective dose. -Consider alternative H2 blockers if needed.  3. Hypertension -well controlled on current medication regimen -continue medication as prescribed    Disposition: Follow up 1 year with Verne Carrow, MD or APP.  Signed, Sharlene Dory, PA-C 06/10/2023, 12:27 PM [facility]

## 2023-06-10 ENCOUNTER — Encounter: Payer: Self-pay | Admitting: Physician Assistant

## 2023-06-10 ENCOUNTER — Ambulatory Visit: Payer: Medicare Other | Attending: Physician Assistant | Admitting: Physician Assistant

## 2023-06-10 VITALS — BP 114/66 | HR 55 | Ht 65.0 in | Wt 188.0 lb

## 2023-06-10 DIAGNOSIS — I10 Essential (primary) hypertension: Secondary | ICD-10-CM | POA: Diagnosis not present

## 2023-06-10 DIAGNOSIS — R002 Palpitations: Secondary | ICD-10-CM

## 2023-06-10 DIAGNOSIS — I119 Hypertensive heart disease without heart failure: Secondary | ICD-10-CM | POA: Diagnosis not present

## 2023-06-10 DIAGNOSIS — Z79899 Other long term (current) drug therapy: Secondary | ICD-10-CM | POA: Diagnosis not present

## 2023-06-10 MED ORDER — EZETIMIBE 10 MG PO TABS: 10.0000 mg | ORAL_TABLET | Freq: Every day | ORAL | 3 refills | Status: AC

## 2023-06-10 NOTE — Patient Instructions (Signed)
Medication Instructions:  Restart Zetia 10 mg a day   *If you need a refill on your cardiac medications before your next appointment, please call your pharmacy*   Lab Work: Lipid and hepatic in 3 months  If you have labs (blood work) drawn today and your tests are completely normal, you will receive your results only by: MyChart Message (if you have MyChart) OR A paper copy in the mail If you have any lab test that is abnormal or we need to change your treatment, we will call you to review the results.   Testing/Procedures:    Follow-Up: At [facility], you and your health needs are our priority.  As part of our continuing mission to provide you with exceptional heart care, we have created designated Provider Care Teams.  These Care Teams include your primary Cardiologist (physician) and Advanced Practice Providers (APPs -  Physician Assistants and Nurse Practitioners) who all work together to provide you with the care you need, when you need it.  We recommend signing up for the patient portal called "MyChart".  Sign up information is provided on this After Visit Summary.  MyChart is used to connect with patients for Virtual Visits (Telemedicine).  Patients are able to view lab/test results, encounter notes, upcoming appointments, etc.  Non-urgent messages can be sent to your provider as well.   To learn more about what you can do with MyChart, go to [email or web address].    Your next appointment:   1 year(s)  Provider:   Verne Carrow, MD     Other Instructions

## 2023-06-16 ENCOUNTER — Other Ambulatory Visit: Payer: Self-pay | Admitting: Family Medicine

## 2023-06-16 DIAGNOSIS — K219 Gastro-esophageal reflux disease without esophagitis: Secondary | ICD-10-CM

## 2023-07-08 ENCOUNTER — Ambulatory Visit: Payer: Medicare Other | Admitting: Clinical

## 2023-07-08 DIAGNOSIS — F411 Generalized anxiety disorder: Secondary | ICD-10-CM | POA: Diagnosis not present

## 2023-07-08 DIAGNOSIS — F331 Major depressive disorder, recurrent, moderate: Secondary | ICD-10-CM | POA: Diagnosis not present

## 2023-07-08 NOTE — Progress Notes (Signed)
 Diagnosis: F33.1, Major Depression, Recurrent, Moderate,  F41.1 Time of session: 9:01 am-9:58 am CPT Code: 16109U (in person)  Nayla was seen in person for individual therapy. She reported upon a challenging few months, during which her husband had undergone heart surgery and she had been caring for him since his recovery. She reflected upon the stress of this experience. Therapist engaged her in discussion of whether and how she might seek help, as well as how to take time for herself. She is scheduled to be seen again in three weeks.    Treatment Plan Client Abilities/Strengths  Malillany presented as motivated to engage in therapy and able to describe her challenges.  Client Treatment Preferences  Tyrihanna prefers in-person appointments  Client Statement of Needs  Michaelyn is seeking cognitive behavioral therapy to manage symptoms of depression and anxiety.  Treatment Level  Biweekly  Symptoms  Anxiety: Excessive worry, sleep irregularity, difficulty relaxing (Status: maintained). Depression:  Depressed mood, lack of motivation, withdrawal from previously enjoyed activities, hopelessness  (Status: maintained).  Problems Addressed  New Description, New Description  Goals 1. Khadija has a history of recurrent depression Objective Caitlyn will increase self esteem and self compassion Target Date: 2024-01-17 Frequency: Daily Progress: 50 Modality: individual Objective Marijah will reduce anxious planning behavior and accept her personal limitations Progress: 50% Target Date: 2024-01-17 Signed by: Charlyne Mom, PhD Version 2 Signed on: 2021-03-16 11:22:00 -0500 Generated on: 2022-12-07T15:59:08Z [name] [facility]) [place] DOB: [birth year]  Objective Hansini will improve her overall mood and sense of well-being Target Date: 2024-01-17 Frequency: Biweekly Progress: 50 Modality: individual Related Interventions 1. Therapist will provide Amily with opportunities to process  her experiences in session 2. Therapist will help Kennette to identify and disengage from maladaptive thought and behavior  patterns using CBT based strategies 3. Therapist will engage Aaralyn in behavior activation, including incorporation of structure,  pleasant events, and mastery events into her routine 4. Therapist will provide referrals for additional resources as appropriate  2. Codee has difficulty setting boundaries in relationships and  prioritizing self-care Objective Heavenlee will develop strategies to support healthy communication and boundary setting in relationships Target Date: 2024-01-17 Frequency: Biweekly Progress: 50 Modality: individual Related Interventions 1. Therapist will provide Tacori with communication strategies and opportunities to practice,  including writing down her thoughts, in-session processing of interactions, and in-vivo role  plays Diagnosis Axis  none 300.02 (Generalized anxiety disorder) - Open - [Signifier: n/a]  Axis  none 296.32 (Major depressive affective disorder, recurrent episode, moderate) - Open -  [Signifier: n/a]  Signed by: Charlyne Mom, PhD Version 2 Signed on: 2021-03-16 11:22:00 -0500 Generated on: 2022-12-07T15:59:08Z [name] DOB: 1946-01-01 Provider: Charlyne Mom, PhD Created on: 2021-03-16 11:10:24 -0500 [facility] [name] [place], [place] [address] [phone] Conditions For Discharge Achievement of treatment goals and objective  Presenting Problem [name] reports a history of recurrent depression coupled with bouts of anxiety that has kept her in and out of therapy for the past 30 years. She also described herself has having a "savior complex" that can make it difficult to set healthy boundaries and take care of herself.  Symptoms depressed mood, excessive worry, lack of motivation, feelings of hopelessness about the future. Suicidal ideation and intent were  denied in risk assessment History of Problem  [name] shared that she has experienced depression on and off for much of her life. Most recently, depression and anxiety has centered on her son and grandson.  Recent Trigger  Jariya shared that her self care especially suffered during COVID, and symptoms of depression have increased as she has reflected upon her age and the next segment of her life. This has also triggered thoughts and fears related to the end of her parents' lives.  Marital and Family Information  Present family concerns/problems: [name] frequently worries for her son and grandson.  Strengths/resources in the family/friends: [name] described a supportive relationship with her husband.  Marital/sexual history patterns: [name] has been married for over 50 years and reported she rarely fights or argues with her husband. However, she also shared that both are conflict avoidant. She has been increasingly concerned about her husband's cognitive functioning in 2023 and 2024. Family of Origin  Problems in family of origin: [name] is the oldest of 4. Her father left the family for extended periods of time, leaving [name] with significant responsibilities in terms of helping her mother manage the household. When she was 33, she became pregnant, and her father sent her away from the home. She gave this baby up for adoption and has never told her son about this, although her husband knows.  Family background / ethnic factors: [name] reported that she lived in a household with significant financial struggles growing up.  No needs/concerns related to ethnicity reported when asked: No  Education/Vocation  Interpersonal concerns/problems: [name] has withdrawn socially since COVID, but reported that she continues to enjoy participating in activities with her church.  Personal strengths: [name] presented as motivated to engage in therapy, and has found it helpful in the past.  Military/work  problems/concerns: None noted.  Leisure Activities/Daily Functioning  decreased interest  Legal Status  No Legal Problems  Medical/Nutritional Concerns  no problems  Comments: [name] is prescribed prozac and synthroid.  Substance use/abuse/dependence  unspecified  Comments: None reported  Religion/Spirituality  believes in god  Other  General Behavior: cooperative  Attire: appropriate  Gait: normal  Motor Activity: normal  Stream of Thought - Productivity: spontaneous  Stream of thought - Progression: normal  Stream of thought - Language: normal  Emotional tone and reactions - Mood: normal  Emotional tone and reactions - Affect: appropriate  Mental trend/Content of thoughts - Perception: normal  Mental trend/Content of thoughts - Orientation: normal  Mental trend/Content of thoughts - Memory: normal  Mental trend/Content of thoughts - General knowledge: consistent with education  Insight: good  Judgment: good      [name], PhD               [name], PhD

## 2023-07-29 ENCOUNTER — Ambulatory Visit (INDEPENDENT_AMBULATORY_CARE_PROVIDER_SITE_OTHER): Admitting: Clinical

## 2023-07-29 DIAGNOSIS — F411 Generalized anxiety disorder: Secondary | ICD-10-CM | POA: Diagnosis not present

## 2023-07-29 DIAGNOSIS — F331 Major depressive disorder, recurrent, moderate: Secondary | ICD-10-CM | POA: Diagnosis not present

## 2023-07-29 NOTE — Progress Notes (Signed)
 Diagnosis: F33.1, Major Depression, Recurrent, Moderate,  F41.1 Time of session: 1:01 pm-2:00 pm CPT Code: 52841L (in person)  Gail Fuentes was seen in person for individual therapy. She reported continued stress related to caring for her husband their household. Therapist offered an opportunity to process, encouraging her to identify opportunities to seek help, including asking her son for help and exploring the idea of assisted living. She is scheduled to be seen again in three weeks.    Treatment Plan Client Abilities/Strengths  Gail Fuentes presented as motivated to engage in therapy and able to describe her challenges.  Client Treatment Preferences  Gail Fuentes prefers in-person appointments  Client Statement of Needs  Gail Fuentes is seeking cognitive behavioral therapy to manage symptoms of depression and anxiety.  Treatment Level  Biweekly  Symptoms  Anxiety: Excessive worry, sleep irregularity, difficulty relaxing (Status: maintained). Depression:  Depressed mood, lack of motivation, withdrawal from previously enjoyed activities, hopelessness  (Status: maintained).  Problems Addressed  New Description, New Description  Goals 1. Gail Fuentes has a history of recurrent depression Objective Gail Fuentes will increase self esteem and self compassion Target Date: 2024-01-17 Frequency: Daily Progress: 50 Modality: individual Objective Gail Fuentes will reduce anxious planning behavior and accept her personal limitations Progress: 50% Target Date: 2024-01-17 Signed by: Charlyne Mom, PhD Version 2 Signed on: 2021-03-16 11:22:00 -0500 Generated on: 2022-12-07T15:59:08Z [name] [facility]) [place] DOB: 03-May-1946  Objective Gail Fuentes will improve her overall mood and sense of well-being Target Date: 2024-01-17 Frequency: Biweekly Progress: 50 Modality: individual Related Interventions 1. Therapist will provide Gail Fuentes with opportunities to process her experiences in session 2. Therapist will help  Gail Fuentes to identify and disengage from maladaptive thought and behavior  patterns using CBT based strategies 3. Therapist will engage Gail Fuentes in behavior activation, including incorporation of structure,  pleasant events, and mastery events into her routine 4. Therapist will provide referrals for additional resources as appropriate  2. Gail Fuentes has difficulty setting boundaries in relationships and  prioritizing self-care Objective Gail Fuentes will develop strategies to support healthy communication and boundary setting in relationships Target Date: 2024-01-17 Frequency: Biweekly Progress: 50 Modality: individual Related Interventions 1. Therapist will provide Gail Fuentes with communication strategies and opportunities to practice,  including writing down her thoughts, in-session processing of interactions, and in-vivo role  plays Diagnosis Axis  none 300.02 (Generalized anxiety disorder) - Open - [Signifier: n/a]  Axis  none 296.32 (Major depressive affective disorder, recurrent episode, moderate) - Open -  [Signifier: n/a]  Signed by: Charlyne Mom, PhD Version 2 Signed on: 2021-03-16 11:22:00 -0500 Generated on: 2022-12-07T15:59:08Z [name] DOB: 1945-11-20 Provider: Charlyne Mom, PhD Created on: 2021-03-16 11:10:24 -0500 [facility] [name] [place], [place] [address] [phone] Conditions For Discharge Achievement of treatment goals and objective  Presenting Problem Gail Fuentes reports a history of recurrent depression coupled with bouts of anxiety that has kept her in and out of therapy for the past 30 years. She also described herself has having a "savior complex" that can make it difficult to set healthy boundaries and take care of herself.  Symptoms depressed mood, excessive worry, lack of motivation, feelings of hopelessness about the future. Suicidal ideation and intent were denied in risk assessment History of Problem   Gail Fuentes shared that she has experienced depression on and off for much of her life. Most recently, depression and anxiety has centered on her son and grandson.  Recent Trigger  Gail Fuentes shared that her self care especially suffered during COVID, and symptoms of depression have increased  as she has reflected upon her age and the next segment of her life. This has also triggered thoughts and fears related to the end of her parents' lives.  Marital and Family Information  Present family concerns/problems: Gail Fuentes frequently worries for her son and grandson.  Strengths/resources in the family/friends: Gail Fuentes described a supportive relationship with her husband.  Marital/sexual history patterns: Gail Fuentes has been married for over 50 years and reported she rarely fights or argues with her husband. However, she also shared that both are conflict avoidant. She has been increasingly concerned about her husband's cognitive functioning in 2023 and 2024. Family of Origin  Problems in family of origin: Gail Fuentes is the oldest of 4. Her father left the family for extended periods of time, leaving Gail Fuentes with significant responsibilities in terms of helping her mother manage the household. When she was 57, she became pregnant, and her father sent her away from the home. She gave this baby up for adoption and has never told her son about this, although her husband knows.  Family background / ethnic factors: Gail Fuentes reported that she lived in a household with significant financial struggles growing up.  No needs/concerns related to ethnicity reported when asked: No  Education/Vocation  Interpersonal concerns/problems: Gail Fuentes has withdrawn socially since COVID, but reported that she continues to enjoy participating in activities with her church.  Personal strengths: Gail Fuentes presented as motivated to engage in therapy, and has found it helpful in the past.  Military/work problems/concerns: None noted.  Leisure Activities/Daily  Functioning  decreased interest  Legal Status  No Legal Problems  Medical/Nutritional Concerns  no problems  Comments: Gail Fuentes is prescribed prozac and synthroid.  Substance use/abuse/dependence  unspecified  Comments: None reported  Religion/Spirituality  believes in god  Other  General Behavior: cooperative  Attire: appropriate  Gait: normal  Motor Activity: normal  Stream of Thought - Productivity: spontaneous  Stream of thought - Progression: normal  Stream of thought - Language: normal  Emotional tone and reactions - Mood: normal  Emotional tone and reactions - Affect: appropriate  Mental trend/Content of thoughts - Perception: normal  Mental trend/Content of thoughts - Orientation: normal  Mental trend/Content of thoughts - Memory: normal  Mental trend/Content of thoughts - General knowledge: consistent with education  Insight: good  Judgment: good      [name], PhD               [name], PhD

## 2023-08-01 ENCOUNTER — Ambulatory Visit: Admitting: Clinical

## 2023-08-16 ENCOUNTER — Ambulatory Visit: Admitting: Clinical

## 2023-08-28 ENCOUNTER — Ambulatory Visit: Admitting: Clinical

## 2023-09-13 ENCOUNTER — Ambulatory Visit (INDEPENDENT_AMBULATORY_CARE_PROVIDER_SITE_OTHER): Admitting: Clinical

## 2023-09-13 DIAGNOSIS — F411 Generalized anxiety disorder: Secondary | ICD-10-CM

## 2023-09-13 DIAGNOSIS — F331 Major depressive disorder, recurrent, moderate: Secondary | ICD-10-CM | POA: Diagnosis not present

## 2023-09-13 NOTE — Progress Notes (Signed)
 Diagnosis: F33.1, Major Depression, Recurrent, Moderate,  F41.1 Time of session: 11:01 am-11:56 am CPT Code: 57846N (in person)  Pattyann was seen in person for individual therapy. Session focused on escalating stressors related to her husband's declining health. Therapist offered an opportunity to process, and encouraged Kaileah to consider how she can say no where possible and find time to care for herself. She shared that she had disclosed this struggle to her daughter in law, and anticipates more support from her son and daughter in law following this conversation. She is scheduled to be seen again in six weeks, and therapist will reach out as cancellations happen.    Treatment Plan Client Abilities/Strengths  Tyreonna presented as motivated to engage in therapy and able to describe her challenges.  Client Treatment Preferences  Laurelle prefers in-person appointments  Client Statement of Needs  Calder is seeking cognitive behavioral therapy to manage symptoms of depression and anxiety.  Treatment Level  Biweekly  Symptoms  Anxiety: Excessive worry, sleep irregularity, difficulty relaxing (Status: maintained). Depression:  Depressed mood, lack of motivation, withdrawal from previously enjoyed activities, hopelessness  (Status: maintained).  Problems Addressed  New Description, New Description  Goals 1. Taylinn has a history of recurrent depression Objective Hetal will increase self esteem and self compassion Target Date: 2024-01-17 Frequency: Daily Progress: 50 Modality: individual Objective Ifunanya will reduce anxious planning behavior and accept her personal limitations Progress: 50% Target Date: 2024-01-17 Signed by: Saint Cranker, PhD Version 2 Signed on: 2021-03-16 11:22:00 -0500 Generated on: 2022-12-07T15:59:08Z Client: [facility]) [place] DOB: Jun 20, 1945  Objective Priscella will improve her overall mood and sense of well-being Target Date: 2024-01-17  Frequency: Biweekly Progress: 50 Modality: individual Related Interventions 1. Therapist will provide Dewanda with opportunities to process her experiences in session 2. Therapist will help Raynee to identify and disengage from maladaptive thought and behavior  patterns using CBT based strategies 3. Therapist will engage Yesenya in behavior activation, including incorporation of structure,  pleasant events, and mastery events into her routine 4. Therapist will provide referrals for additional resources as appropriate  2. Lamar has difficulty setting boundaries in relationships and  prioritizing self-care Objective Madilyn will develop strategies to support healthy communication and boundary setting in relationships Target Date: 2024-01-17 Frequency: Biweekly Progress: 50 Modality: individual Related Interventions 1. Therapist will provide Tannie with communication strategies and opportunities to practice,  including writing down her thoughts, in-session processing of interactions, and in-vivo role  plays Diagnosis Axis  none 300.02 (Generalized anxiety disorder) - Open - [Signifier: n/a]  Axis  none 296.32 (Major depressive affective disorder, recurrent episode, moderate) - Open -  [Signifier: n/a]  Signed by: Saint Cranker, PhD Version 2 Signed on: 2021-03-16 11:22:00 -0500 Generated on: 2022-12-07T15:59:08Z Client: [name] DOB: 14-Dec-1945 Provider: Saint Cranker, PhD Created on: 2021-03-16 11:10:24 -0500 [facility] [address] [place], [place] [address] [phone] Conditions For Discharge Achievement of treatment goals and objective  Presenting Problem [name] reports a history of recurrent depression coupled with bouts of anxiety that has kept her in and out of therapy for the past 30 years. She also described herself has having a "savior complex" that can make it difficult to set healthy boundaries and take care of herself.   Symptoms depressed mood, excessive worry, lack of motivation, feelings of hopelessness about the future. Suicidal ideation and intent were denied in risk assessment History of Problem  [name] shared that she has experienced depression on and off for much of her life.  Most recently, depression and anxiety has centered on her son and grandson.  Recent Trigger  Isyss shared that her self care especially suffered during COVID, and symptoms of depression have increased as she has reflected upon her age and the next segment of her life. This has also triggered thoughts and fears related to the end of her parents' lives.  Marital and Family Information  Present family concerns/problems: [name] frequently worries for her son and grandson.  Strengths/resources in the family/friends: Stephenie Einstein described a supportive relationship with her husband.  Marital/sexual history patterns: Stephenie Einstein has been married for over 50 years and reported she rarely fights or argues with her husband. However, she also shared that both are conflict avoidant. She has been increasingly concerned about her husband's cognitive functioning in 2023 and 2024. Family of Origin  Problems in family of origin: Stephenie Einstein is the oldest of 4. Her father left the family for extended periods of time, leaving [name] with significant responsibilities in terms of helping her mother manage the household. When she was 35, she became pregnant, and her father sent her away from the home. She gave this baby up for adoption and has never told her son about this, although her husband knows.  Family background / ethnic factors: [name] reported that she lived in a household with significant financial struggles growing up.  No needs/concerns related to ethnicity reported when asked: No  Education/Vocation  Interpersonal concerns/problems: Stephenie Einstein has withdrawn socially since COVID, but reported that she continues to enjoy participating in activities with her  church.  Personal strengths: Stephenie Einstein presented as motivated to engage in therapy, and has found it helpful in the past.  Military/work problems/concerns: None noted.  Leisure Activities/Daily Functioning  decreased interest  Legal Status  No Legal Problems  Medical/Nutritional Concerns  no problems  Comments: Stephenie Einstein is prescribed prozac  and synthroid .  Substance use/abuse/dependence  unspecified  Comments: None reported  Religion/Spirituality  believes in god  Other  General Behavior: cooperative  Attire: appropriate  Gait: normal  Motor Activity: normal  Stream of Thought - Productivity: spontaneous  Stream of thought - Progression: normal  Stream of thought - Language: normal  Emotional tone and reactions - Mood: normal  Emotional tone and reactions - Affect: appropriate  Mental trend/Content of thoughts - Perception: normal  Mental trend/Content of thoughts - Orientation: normal  Mental trend/Content of thoughts - Memory: normal  Mental trend/Content of thoughts - General knowledge: consistent with education  Insight: good  Judgment: good     Arlene Lacy, PhD               Arlene Lacy, PhD

## 2023-10-11 ENCOUNTER — Ambulatory Visit: Admitting: Clinical

## 2023-10-11 DIAGNOSIS — F411 Generalized anxiety disorder: Secondary | ICD-10-CM | POA: Diagnosis not present

## 2023-10-11 DIAGNOSIS — F331 Major depressive disorder, recurrent, moderate: Secondary | ICD-10-CM | POA: Diagnosis not present

## 2023-10-11 NOTE — Progress Notes (Signed)
 Diagnosis: F33.1, Major Depression, Recurrent, Moderate,  F41.1 Time of session: 8:01 am-8:59 am CPT Code: 16109U (in person)  Gail Fuentes was seen in person for individual therapy. She reported upon continued stress related to her husband's care and health. She had shared her situation with several family members, who had been supportive. She is awaiting results from a recent neurologist appointment to discuss her concerns with her husband. Therapist offered validation and support, encouraging Gail Fuentes to continue connecting with supportive others and to look for opportunities to rest and engage in preferred activities. She is scheduled to be seen again in two weeks.    Treatment Plan Client Abilities/Strengths  Gail Fuentes presented as motivated to engage in therapy and able to describe her challenges.  Client Treatment Preferences  Gail Fuentes prefers in-person appointments  Client Statement of Needs  Gail Fuentes is seeking cognitive behavioral therapy to manage symptoms of depression and anxiety.  Treatment Level  Biweekly  Symptoms  Anxiety: Excessive worry, sleep irregularity, difficulty relaxing (Status: maintained). Depression:  Depressed mood, lack of motivation, withdrawal from previously enjoyed activities, hopelessness  (Status: maintained).  Problems Addressed  New Description, New Description  Goals 1. Gail Fuentes has a history of recurrent depression Objective Gail Fuentes will increase self esteem and self compassion Target Date: 2024-01-17 Frequency: Daily Progress: 50 Modality: individual Objective Gail Fuentes will reduce anxious planning behavior and accept her personal limitations Progress: 50% Target Date: 2024-01-17 Signed by: Saint Cranker, PhD Version 2 Signed on: 2021-03-16 11:22:00 -0500 Generated on: 2022-12-07T15:59:08Z [name] [facility]) [place] DOB: Sep 15, 1945  Objective Gail Fuentes will improve her overall mood and sense of well-being Target Date: 2024-01-17 Frequency:  Biweekly Progress: 50 Modality: individual Related Interventions 1. Therapist will provide Gail Fuentes with opportunities to process her experiences in session 2. Therapist will help Gail Fuentes to identify and disengage from maladaptive thought and behavior  patterns using CBT based strategies 3. Therapist will engage Gail Fuentes in behavior activation, including incorporation of structure,  pleasant events, and mastery events into her routine 4. Therapist will provide referrals for additional resources as appropriate  2. Gail Fuentes has difficulty setting boundaries in relationships and  prioritizing self-care Objective Gail Fuentes will develop strategies to support healthy communication and boundary setting in relationships Target Date: 2024-01-17 Frequency: Biweekly Progress: 50 Modality: individual Related Interventions 1. Therapist will provide Gail Fuentes with communication strategies and opportunities to practice,  including writing down her thoughts, in-session processing of interactions, and in-vivo role  plays Diagnosis Axis  none 300.02 (Generalized anxiety disorder) - Open - [Signifier: n/a]  Axis  none 296.32 (Major depressive affective disorder, recurrent episode, moderate) - Open -  [Signifier: n/a]  Signed by: Saint Cranker, PhD Version 2 Signed on: 2021-03-16 11:22:00 -0500 Generated on: 2022-12-07T15:59:08Z [name] DOB: 1945/06/01 Provider: Saint Cranker, PhD Created on: 2021-03-16 11:10:24 -0500 [facility] [address] [place], [place] [address] [phone] Conditions For Discharge Achievement of treatment goals and objective  Presenting Problem Gail Fuentes reports a history of recurrent depression coupled with bouts of anxiety that has kept her in and out of therapy for the past 30 years. She also described herself has having a "savior complex" that can make it difficult to set healthy boundaries and take care of herself.   Symptoms depressed mood, excessive worry, lack of motivation, feelings of hopelessness about the future. Suicidal ideation and intent were denied in risk assessment History of Problem  Gail Fuentes shared that she has experienced depression on and off for much of her life. Most recently, depression and anxiety  has centered on her son and grandson.  Recent Trigger  Gail Fuentes shared that her self care especially suffered during COVID, and symptoms of depression have increased as she has reflected upon her age and the next segment of her life. This has also triggered thoughts and fears related to the end of her parents' lives.  Marital and Family Information  Present family concerns/problems: Gail Fuentes frequently worries for her son and grandson.  Strengths/resources in the family/friends: Gail Fuentes described a supportive relationship with her husband.  Marital/sexual history patterns: Gail Fuentes has been married for over 50 years and reported she rarely fights or argues with her husband. However, she also shared that both are conflict avoidant. She has been increasingly concerned about her husband's cognitive functioning in 2023 and 2024. Family of Origin  Problems in family of origin: Gail Fuentes is the oldest of 4. Her father left the family for extended periods of time, leaving Gail Fuentes with significant responsibilities in terms of helping her mother manage the household. When she was 56, she became pregnant, and her father sent her away from the home. She gave this baby up for adoption and has never told her son about this, although her husband knows.  Family background / ethnic factors: Gail Fuentes reported that she lived in a household with significant financial struggles growing up.  No needs/concerns related to ethnicity reported when asked: No  Education/Vocation  Interpersonal concerns/problems: Gail Fuentes has withdrawn socially since COVID, but reported that she continues to enjoy participating in activities with her  church.  Personal strengths: Gail Fuentes presented as motivated to engage in therapy, and has found it helpful in the past.  Military/work problems/concerns: None noted.  Leisure Activities/Daily Functioning  decreased interest  Legal Status  No Legal Problems  Medical/Nutritional Concerns  no problems  Comments: Gail Fuentes is prescribed prozac  and synthroid .  Substance use/abuse/dependence  unspecified  Comments: None reported  Religion/Spirituality  believes in god  Other  General Behavior: cooperative  Attire: appropriate  Gait: normal  Motor Activity: normal  Stream of Thought - Productivity: spontaneous  Stream of thought - Progression: normal  Stream of thought - Language: normal  Emotional tone and reactions - Mood: normal  Emotional tone and reactions - Affect: appropriate  Mental trend/Content of thoughts - Perception: normal  Mental trend/Content of thoughts - Orientation: normal  Mental trend/Content of thoughts - Memory: normal  Mental trend/Content of thoughts - General knowledge: consistent with education  Insight: good  Judgment: good     Arlene Lacy, PhD               Arlene Lacy, PhD

## 2023-10-25 ENCOUNTER — Ambulatory Visit: Admitting: Clinical

## 2023-10-25 DIAGNOSIS — F411 Generalized anxiety disorder: Secondary | ICD-10-CM

## 2023-10-25 DIAGNOSIS — F331 Major depressive disorder, recurrent, moderate: Secondary | ICD-10-CM

## 2023-10-25 NOTE — Progress Notes (Signed)
 Diagnosis: F33.1, Major Depression, Recurrent, Moderate,  F41.1 Time of session: 10:01 am-10:59 am CPT Code: 16109U (in person)  Gail Fuentes was seen in person for individual therapy. Session focused on continued stress related to caring for her husband and helping family members with various challenges. Therapist engaged her in discussion of how she might be able to relieve some of her workload, suggesting hiring a service to clean her home, and Gail Fuentes expressed interest in this. She is scheduled to be seen again in two weeks.    Treatment Plan Client Abilities/Strengths  Gail Fuentes presented as motivated to engage in therapy and able to describe her challenges.  Client Treatment Preferences  Gail Fuentes prefers in-person appointments  Client Statement of Needs  Gail Fuentes is seeking cognitive behavioral therapy to manage symptoms of depression and anxiety.  Treatment Level  Biweekly  Symptoms  Anxiety: Excessive worry, sleep irregularity, difficulty relaxing (Status: maintained). Depression:  Depressed mood, lack of motivation, withdrawal from previously enjoyed activities, hopelessness  (Status: maintained).  Problems Addressed  New Description, New Description  Goals 1. Gail Fuentes has a history of recurrent depression Objective Gail Fuentes will increase self esteem and self compassion Target Date: 2024-01-17 Frequency: Daily Progress: 50 Modality: individual Objective Gail Fuentes will reduce anxious planning behavior and accept her personal limitations Progress: 50% Target Date: 2024-01-17 Signed by: Saint Cranker, PhD Version 2 Signed on: 2021-03-16 11:22:00 -0500 Generated on: 2022-12-07T15:59:08Z [name]) [place] DOB: [birth year]  Objective Gail Fuentes will improve her overall mood and sense of well-being Target Date: 2024-01-17 Frequency: Biweekly Progress: 50 Modality: individual Related Interventions 1. Therapist will provide Gail Fuentes with opportunities to process her  experiences in session 2. Therapist will help Gail Fuentes to identify and disengage from maladaptive thought and behavior  patterns using CBT based strategies 3. Therapist will engage Gail Fuentes in behavior activation, including incorporation of structure,  pleasant events, and mastery events into her routine 4. Therapist will provide referrals for additional resources as appropriate  2. Gail Fuentes has difficulty setting boundaries in relationships and  prioritizing self-care Objective Gail Fuentes will develop strategies to support healthy communication and boundary setting in relationships Target Date: 2024-01-17 Frequency: Biweekly Progress: 50 Modality: individual Related Interventions 1. Therapist will provide Gail Fuentes with communication strategies and opportunities to practice,  including writing down her thoughts, in-session processing of interactions, and in-vivo role  plays Diagnosis Axis  none 300.02 (Generalized anxiety disorder) - Open - [Signifier: n/a]  Axis  none 296.32 (Major depressive affective disorder, recurrent episode, moderate) - Open -  [Signifier: n/a]  Signed by: Saint Cranker, PhD Version 2 Signed on: 2021-03-16 11:22:00 -0500 Generated on: 2022-12-07T15:59:08Z [name] DOB: 05-Jan-1946 Provider: Saint Cranker, PhD Created on: 2021-03-16 11:10:24 -0500 [facility] [address] [place], [place] [address] [phone] Conditions For Discharge Achievement of treatment goals and objective  Presenting Problem Gail Fuentes reports a history of recurrent depression coupled with bouts of anxiety that has kept her in and out of therapy for the past 30 years. She also described herself has having a savior complex that can make it difficult to set healthy boundaries and take care of herself.  Symptoms depressed mood, excessive worry, lack of motivation, feelings of hopelessness about the future. Suicidal ideation and intent were  denied in risk assessment History of Problem  Gail Fuentes shared that she has experienced depression on and off for much of her life. Most recently, depression and anxiety has centered on her son and grandson.  Recent Trigger  Gail Fuentes shared that her self care  especially suffered during COVID, and symptoms of depression have increased as she has reflected upon her age and the next segment of her life. This has also triggered thoughts and fears related to the end of her parents' lives.  Marital and Family Information  Present family concerns/problems: Gail Fuentes frequently worries for her son and grandson.  Strengths/resources in the family/friends: Gail Fuentes described a supportive relationship with her husband.  Marital/sexual history patterns: Gail Fuentes has been married for over 50 years and reported she rarely fights or argues with her husband. However, she also shared that both are conflict avoidant. She has been increasingly concerned about her husband's cognitive functioning in 2023 and 2024. Family of Origin  Problems in family of origin: Gail Fuentes is the oldest of 4. Her father left the family for extended periods of time, leaving Gail Fuentes with significant responsibilities in terms of helping her mother manage the household. When she was 44, she became pregnant, and her father sent her away from the home. She gave this baby up for adoption and has never told her son about this, although her husband knows.  Family background / ethnic factors: Gail Fuentes reported that she lived in a household with significant financial struggles growing up.  No needs/concerns related to ethnicity reported when asked: No  Education/Vocation  Interpersonal concerns/problems: Gail Fuentes has withdrawn socially since COVID, but reported that she continues to enjoy participating in activities with her church.  Personal strengths: Gail Fuentes presented as motivated to engage in therapy, and has found it helpful in the past.  Military/work  problems/concerns: None noted.  Leisure Activities/Daily Functioning  decreased interest  Legal Status  No Legal Problems  Medical/Nutritional Concerns  no problems  Comments: Gail Fuentes is prescribed prozac  and synthroid .  Substance use/abuse/dependence  unspecified  Comments: None reported  Religion/Spirituality  believes in god  Other  General Behavior: cooperative  Attire: appropriate  Gait: normal  Motor Activity: normal  Stream of Thought - Productivity: spontaneous  Stream of thought - Progression: normal  Stream of thought - Language: normal  Emotional tone and reactions - Mood: normal  Emotional tone and reactions - Affect: appropriate  Mental trend/Content of thoughts - Perception: normal  Mental trend/Content of thoughts - Orientation: normal  Mental trend/Content of thoughts - Memory: normal  Mental trend/Content of thoughts - General knowledge: consistent with education  Insight: good  Judgment: good           Arlene Lacy, PhD               Arlene Lacy, PhD

## 2023-11-07 ENCOUNTER — Ambulatory Visit: Admitting: Clinical

## 2023-11-07 DIAGNOSIS — F331 Major depressive disorder, recurrent, moderate: Secondary | ICD-10-CM

## 2023-11-07 DIAGNOSIS — F411 Generalized anxiety disorder: Secondary | ICD-10-CM | POA: Diagnosis not present

## 2023-11-07 NOTE — Progress Notes (Signed)
 Diagnosis: F33.1, Major Depression, Recurrent, Moderate,  F41.1 Time of session: 10:00 am-10:59 am CPT Code: 09162E (in person)  Daijha was seen in person for individual therapy. Session focused on continued stress related to caring for her husband, as well as difficulty obtaining results from his neurology appointment. Therapist provided an opportunity to process, offering validation and support, and engaged her in discussion of ways she may access additional support. Therapist also offered to look into whether there are any appropriate support groups in the area. She is scheduled to be seen again in two weeks.    Treatment Plan Client Abilities/Strengths  Annise presented as motivated to engage in therapy and able to describe her challenges.  Client Treatment Preferences  Charmon prefers in-person appointments  Client Statement of Needs  Sequoyah is seeking cognitive behavioral therapy to manage symptoms of depression and anxiety.  Treatment Level  Biweekly  Symptoms  Anxiety: Excessive worry, sleep irregularity, difficulty relaxing (Status: maintained). Depression:  Depressed mood, lack of motivation, withdrawal from previously enjoyed activities, hopelessness  (Status: maintained).  Problems Addressed  New Description, New Description  Goals 1. Zoriah has a history of recurrent depression Objective Tinamarie will increase self esteem and self compassion Target Date: 2024-01-17 Frequency: Daily Progress: 50 Modality: individual Objective Maybelline will reduce anxious planning behavior and accept her personal limitations Progress: 50% Target Date: 2024-01-17 Signed by: Andriette Ponto, PhD Version 2 Signed on: 2021-03-16 11:22:00 -0500 Generated on: 2022-12-07T15:59:08Z Client: [name] [name]) [place] DOB: 01-12-1946  Objective Kelina will improve her overall mood and sense of well-being Target Date: 2024-01-17 Frequency: Biweekly Progress: 50 Modality: individual Related  Interventions 1. Therapist will provide Offie with opportunities to process her experiences in session 2. Therapist will help Kennadee to identify and disengage from maladaptive thought and behavior  patterns using CBT based strategies 3. Therapist will engage Danine in behavior activation, including incorporation of structure,  pleasant events, and mastery events into her routine 4. Therapist will provide referrals for additional resources as appropriate  2. Ariana has difficulty setting boundaries in relationships and  prioritizing self-care Objective Jacquel will develop strategies to support healthy communication and boundary setting in relationships Target Date: 2024-01-17 Frequency: Biweekly Progress: 50 Modality: individual Related Interventions 1. Therapist will provide Lilybeth with communication strategies and opportunities to practice,  including writing down her thoughts, in-session processing of interactions, and in-vivo role  plays Diagnosis Axis  none 300.02 (Generalized anxiety disorder) - Open - [Signifier: n/a]  Axis  none 296.32 (Major depressive affective disorder, recurrent episode, moderate) - Open -  [Signifier: n/a]  Signed by: Andriette Ponto, PhD Version 2 Signed on: 2021-03-16 11:22:00 -0500 Generated on: 2022-12-07T15:59:08Z Client: [name] [name] [name] [name] DOB: August 16, 1945 Provider: Andriette Ponto, PhD Created on: 2021-03-16 11:10:24 -0500 [facility] [address] [place], [place] [address] [phone] Conditions For Discharge Achievement of treatment goals and objective  Presenting Problem [name] reports a history of recurrent depression coupled with bouts of anxiety that has kept her in and out of therapy for the past 30 years. She also described herself has having a savior complex that can make it difficult to set healthy boundaries and take care of herself.  Symptoms depressed mood, excessive worry, lack of motivation,  feelings of hopelessness about the future. Suicidal ideation and intent were denied in risk assessment History of Problem  [name] shared that she has experienced depression on and off for much of her life. Most recently, depression and anxiety has centered on her son and grandson.  Recent Trigger  Lasheka shared that her self care especially suffered during COVID, and symptoms of depression have increased as she has reflected upon her age and the next segment of her life. This has also triggered thoughts and fears related to the end of her parents' lives.  Marital and Family Information  Present family concerns/problems: [name] frequently worries for her son and grandson.  Strengths/resources in the family/friends: [name] described a supportive relationship with her husband.  Marital/sexual history patterns: [name] has been married for over 50 years and reported she rarely fights or argues with her husband. However, she also shared that both are conflict avoidant. She has been increasingly concerned about her husband's cognitive functioning in 2023 and 2024. Family of Origin  Problems in family of origin: [name] is the oldest of 4. Her father left the family for extended periods of time, leaving [name] with significant responsibilities in terms of helping her mother manage the household. When she was 75, she became pregnant, and her father sent her away from the home. She gave this baby up for adoption and has never told her son about this, although her husband knows.  Family background / ethnic factors: [name] reported that she lived in a household with significant financial struggles growing up.  No needs/concerns related to ethnicity reported when asked: No  Education/Vocation  Interpersonal concerns/problems: [name] has withdrawn socially since COVID, but reported that she continues to enjoy participating in activities with her church.  Personal strengths: [name] presented as motivated to engage  in therapy, and has found it helpful in the past.  Military/work problems/concerns: None noted.  Leisure Activities/Daily Functioning  decreased interest  Legal Status  No Legal Problems  Medical/Nutritional Concerns  no problems  Comments: [name] is prescribed prozac  and synthroid .  Substance use/abuse/dependence  unspecified  Comments: None reported  Religion/Spirituality  believes in god  Other  General Behavior: cooperative  Attire: appropriate  Gait: normal  Motor Activity: normal  Stream of Thought - Productivity: spontaneous  Stream of thought - Progression: normal  Stream of thought - Language: normal  Emotional tone and reactions - Mood: normal  Emotional tone and reactions - Affect: appropriate  Mental trend/Content of thoughts - Perception: normal  Mental trend/Content of thoughts - Orientation: normal  Mental trend/Content of thoughts - Memory: normal  Mental trend/Content of thoughts - General knowledge: consistent with education  Insight: good  Judgment: good        Andriette LITTIE Ponto, PhD               Andriette LITTIE Ponto, PhD

## 2023-11-21 ENCOUNTER — Telehealth: Payer: Self-pay | Admitting: Family Medicine

## 2023-11-21 ENCOUNTER — Ambulatory Visit (INDEPENDENT_AMBULATORY_CARE_PROVIDER_SITE_OTHER): Admitting: Clinical

## 2023-11-21 DIAGNOSIS — F331 Major depressive disorder, recurrent, moderate: Secondary | ICD-10-CM | POA: Diagnosis not present

## 2023-11-21 DIAGNOSIS — F411 Generalized anxiety disorder: Secondary | ICD-10-CM | POA: Diagnosis not present

## 2023-11-21 NOTE — Telephone Encounter (Signed)
 Copied from CRM [id number]. Topic: Medicare AWV >> Nov 21, 2023 10:58 AM Nathanel DEL wrote: Reason for CRM: Called LVM 11/21/2023 to schedule AWV. Please schedule Virtual or Telehealth visits ONLY.   Nathanel Paschal; Care Guide Ambulatory Clinical Support [address] [facility] Direct Dial: [phone]

## 2023-11-21 NOTE — Progress Notes (Signed)
 Diagnosis: F33.1, Major Depression, Recurrent, Moderate,  F41.1 Time of session: 10:00 am-10:59 am CPT Code: 09162E (in person)  Agripina Guyette was seen in person for individual therapy. She reported continued stressors related to her husband's health. Therapist provided several options for support groups, and offered validation and support. Therapist also reframed several thoughts she reported. She is scheduled to be seen again in two weeks.    Treatment Plan Client Abilities/Strengths  Sehaj presented as motivated to engage in therapy and able to describe her challenges.  Client Treatment Preferences  Azaela prefers in-person appointments  Client Statement of Needs  Glennys is seeking cognitive behavioral therapy to manage symptoms of depression and anxiety.  Treatment Level  Biweekly  Symptoms  Anxiety: Excessive worry, sleep irregularity, difficulty relaxing (Status: maintained). Depression:  Depressed mood, lack of motivation, withdrawal from previously enjoyed activities, hopelessness  (Status: maintained).  Problems Addressed  New Description, New Description  Goals 1. Keysha has a history of recurrent depression Objective Nickola will increase self esteem and self compassion Target Date: 2024-01-17 Frequency: Daily Progress: 50 Modality: individual Objective Lavelle will reduce anxious planning behavior and accept her personal limitations Progress: 50% Target Date: 2024-01-17 Signed by: Andriette Ponto, PhD Version 2 Signed on: 2021-03-16 11:22:00 -0500 Generated on: 2022-12-07T15:59:08Z Client: [name] [name]) [place] DOB: 1945/11/23  Objective Mel will improve her overall mood and sense of well-being Target Date: 2024-01-17 Frequency: Biweekly Progress: 50 Modality: individual Related Interventions 1. Therapist will provide Breland with opportunities to process her experiences in session 2. Therapist will help Sherman to identify and disengage from maladaptive  thought and behavior  patterns using CBT based strategies 3. Therapist will engage Jim in behavior activation, including incorporation of structure,  pleasant events, and mastery events into her routine 4. Therapist will provide referrals for additional resources as appropriate  2. Haidynn has difficulty setting boundaries in relationships and  prioritizing self-care Objective Dashea will develop strategies to support healthy communication and boundary setting in relationships Target Date: 2024-01-17 Frequency: Biweekly Progress: 50 Modality: individual Related Interventions 1. Therapist will provide Valorie with communication strategies and opportunities to practice,  including writing down her thoughts, in-session processing of interactions, and in-vivo role  plays Diagnosis Axis  none 300.02 (Generalized anxiety disorder) - Open - [Signifier: n/a]  Axis  none 296.32 (Major depressive affective disorder, recurrent episode, moderate) - Open -  [Signifier: n/a]  Signed by: Andriette Ponto, PhD Version 2 Signed on: 2021-03-16 11:22:00 -0500 Generated on: 2022-12-07T15:59:08Z Client: [name] [name] [name] [name] DOB: 12-Feb-1946 Provider: Andriette Ponto, PhD Created on: 2021-03-16 11:10:24 -0500 [facility] [address] [place], [place] [address] [phone] Conditions For Discharge Achievement of treatment goals and objective  Presenting Problem [name] reports a history of recurrent depression coupled with bouts of anxiety that has kept her in and out of therapy for the past 30 years. She also described herself has having a savior complex that can make it difficult to set healthy boundaries and take care of herself.  Symptoms depressed mood, excessive worry, lack of motivation, feelings of hopelessness about the future. Suicidal ideation and intent were denied in risk assessment History of Problem  [name] shared that she has experienced depression on  and off for much of her life. Most recently, depression and anxiety has centered on her son and grandson.  Recent Trigger  Markisha shared that her self care especially suffered during COVID, and symptoms of depression have increased as she has reflected upon her age and the next segment  of her life. This has also triggered thoughts and fears related to the end of her parents' lives.  Marital and Family Information  Present family concerns/problems: [name] frequently worries for her son and grandson.  Strengths/resources in the family/friends: [name] described a supportive relationship with her husband.  Marital/sexual history patterns: [name] has been married for over 50 years and reported she rarely fights or argues with her husband. However, she also shared that both are conflict avoidant. She has been increasingly concerned about her husband's cognitive functioning in 2023 and 2024. Family of Origin  Problems in family of origin: [name] is the oldest of 4. Her father left the family for extended periods of time, leaving [name] with significant responsibilities in terms of helping her mother manage the household. When she was 28, she became pregnant, and her father sent her away from the home. She gave this baby up for adoption and has never told her son about this, although her husband knows.  Family background / ethnic factors: [name] reported that she lived in a household with significant financial struggles growing up.  No needs/concerns related to ethnicity reported when asked: No  Education/Vocation  Interpersonal concerns/problems: [name] has withdrawn socially since COVID, but reported that she continues to enjoy participating in activities with her church.  Personal strengths: [name] presented as motivated to engage in therapy, and has found it helpful in the past.  Military/work problems/concerns: None noted.  Leisure Activities/Daily Functioning  decreased interest  Legal Status  No  Legal Problems  Medical/Nutritional Concerns  no problems  Comments: [name] is prescribed prozac  and synthroid .  Substance use/abuse/dependence  unspecified  Comments: None reported  Religion/Spirituality  believes in god  Other  General Behavior: cooperative  Attire: appropriate  Gait: normal  Motor Activity: normal  Stream of Thought - Productivity: spontaneous  Stream of thought - Progression: normal  Stream of thought - Language: normal  Emotional tone and reactions - Mood: normal  Emotional tone and reactions - Affect: appropriate  Mental trend/Content of thoughts - Perception: normal  Mental trend/Content of thoughts - Orientation: normal  Mental trend/Content of thoughts - Memory: normal  Mental trend/Content of thoughts - General knowledge: consistent with education  Insight: good  Judgment: good       Andriette LITTIE Ponto, PhD               Andriette LITTIE Ponto, PhD

## 2023-12-05 ENCOUNTER — Ambulatory Visit: Admitting: Clinical

## 2023-12-05 ENCOUNTER — Ambulatory Visit: Payer: Medicare Other | Admitting: Internal Medicine

## 2023-12-05 ENCOUNTER — Encounter: Payer: Self-pay | Admitting: Internal Medicine

## 2023-12-05 VITALS — BP 122/70 | HR 65 | Ht 65.0 in | Wt 186.4 lb

## 2023-12-05 DIAGNOSIS — E041 Nontoxic single thyroid nodule: Secondary | ICD-10-CM | POA: Diagnosis not present

## 2023-12-05 DIAGNOSIS — E559 Vitamin D deficiency, unspecified: Secondary | ICD-10-CM

## 2023-12-05 DIAGNOSIS — E039 Hypothyroidism, unspecified: Secondary | ICD-10-CM | POA: Diagnosis not present

## 2023-12-05 DIAGNOSIS — Z8639 Personal history of other endocrine, nutritional and metabolic disease: Secondary | ICD-10-CM

## 2023-12-05 DIAGNOSIS — M8080XD Other osteoporosis with current pathological fracture, unspecified site, subsequent encounter for fracture with routine healing: Secondary | ICD-10-CM

## 2023-12-05 DIAGNOSIS — M810 Age-related osteoporosis without current pathological fracture: Secondary | ICD-10-CM

## 2023-12-05 LAB — BASIC METABOLIC PANEL WITHOUT GFR
BUN: 10 mg/dL (ref 7–25)
CO2: 27 mmol/L (ref 20–32)
Calcium: 9.9 mg/dL (ref 8.6–10.4)
Chloride: 103 mmol/L (ref 98–110)
Creat: 0.76 mg/dL (ref 0.60–1.00)
Glucose, Bld: 77 mg/dL (ref 65–99)
Potassium: 4.8 mmol/L (ref 3.5–5.3)
Sodium: 139 mmol/L (ref 135–146)

## 2023-12-05 LAB — T4, FREE: Free T4: 1.4 ng/dL (ref 0.8–1.8)

## 2023-12-05 LAB — VITAMIN D 25 HYDROXY (VIT D DEFICIENCY, FRACTURES): Vit D, 25-Hydroxy: 75 ng/mL (ref 30–100)

## 2023-12-05 LAB — TSH: TSH: 0.91 m[IU]/L (ref 0.40–4.50)

## 2023-12-05 NOTE — Progress Notes (Signed)
 Subjective:     Patient ID: Gail Fuentes, female   DOB: 02/15/1946, 78 y.o.   MRN: [id number]  HPI Gail Fuentes is a pleasant 78 y/o returning for f/u for history of hyperparathyroidism - s/p parathyroidectomy, hypothyroidism, and a R thyroid  nodule. Last visit 1 year ago.  Interim history: No falls or fractures since last OV. No dizziness, vertigo, vision problems.  She is very stressed and very tired as she is taking care of her husband, who had heart surgery and now has dementia, and also taking care of her 2 brothers, who are disabled. She still has fatigue.  No formal exercise.  She has sciatica pain. She had Xrays few mo ago >> normal. She stared to feel better.  Reviewed history: Pt had a thyroid  ultrasound (10/08/2012) showing only a right thyroid  nodule, 7 x 7 x 7 mm, with internal calcifications. On a subsequent thyroid  U/S >> this nodule has disappeared.  A technetium sestamibi scan showed a left inferior parathyroid  adenoma and a CT scan confirmed this. However, during surgery, it was found that she had a left superior parathyroid  adenoma, which was excised - left superior parathyroidectomy with Dr Eletha in 02/2013.   She feels much better after surgery.  Her PTH and calcium levels normalized after surgery: Lab Results  Component Value Date   PTH 35 11/15/2015   PTH Comment 11/15/2015   PTH 41 11/15/2014   PTH Comment 11/15/2014   PTH 44 03/16/2014   PTH Comment 03/16/2014   CALCIUM 9.1 02/07/2023   CALCIUM 9.2 02/05/2022   CALCIUM 9.6 01/25/2021   CALCIUM 9.0 11/22/2020   CALCIUM 9.9 12/10/2019   CALCIUM 9.5 09/16/2019   CALCIUM 9.4 11/20/2018   CALCIUM 9.7 03/06/2018   CALCIUM 9.3 11/15/2017   CALCIUM 9.8 02/13/2017   CALCIUM 9.3 11/15/2016   CALCIUM 9.7 11/15/2015   CALCIUM 9.5 07/18/2015   CALCIUM 9.5 11/15/2014   CALCIUM 9.4 09/24/2014   No history of kidney stones but had an osteoporotic T score at the ultra distal radius per DXA scan in 2014.   +  h/o R Styloid radial fracture 10/2018.  12/10/2022 ([facility]) Lumbar spine L1-L4 Femoral neck (FN) 33% distal radius Ultra distal radius  T-score -2.4 RFN: -1.9 LFN: -2.4 -3.0 -3.8  Change in BMD from previous DXA test (%) -1.4% n/a -11%* n/a  (*) statistically significant  DXA scan ([facility]) 12/05/2020 - osteoporosis: - L1-L4: -2.3 (+2.9%) - RFN: -1.9 - LFN: -2.5 (-5.3%* for both FN) -33% distal radius: -2.1 (-2.4%)  DXA scan ([facility]) 12/03/2018 - osteoporosis: - L1-L4: -2.5 (-3.7%*) - RFN: -2.0 - LFN: -1.9 (-1.3%* for both FN) -33% distal radius: -1.9 (-4.4%)  DXA scan ([facility]) 11/28/2016 - osteopenia: - L1-L4: -2.2 - RFN: -1.1 - LFN: -1.3 FRAX: Major osteoporotic fracture risk over 10 years: 13.7; hip fracture risk over 10 years: 2.1. But unfortunately appendicular skeleton was not checked.  I suggested Prolia after the 2020 bone density returned but she was afraid of side effects and did not start.  I then advised her to review information about Fosamax  or Reclast, but she declined these, also.   However, we ended up starting Fosamax  11/2021.  She ended up stopping this few months ago as she was forgetting it.  She has FH of M - many fractures.  H/o vit D def  Previously on ergocalciferol , then on vitamin D  4000 units daily.  Reviewed vitamin D  levels:  Lab Results  Component Value Date   VD25OH 66.45 11/27/2022   VD25OH 60.13 11/22/2021   VD25OH 65.97 11/22/2020   VD25OH 72.94 09/16/2019   VD25OH 62.76 11/20/2018   VD25OH 25.82 (L) 11/15/2017   VD25OH 43.09 11/15/2016   VD25OH 45.13 11/15/2015   VD25OH 34.16 07/18/2015   VD25OH 30.52 10/12/2014   Hypothyroidism.  -Longstanding  Pt is on Levothyroxine  75 mcg daily: - in am - coffee + creamer - >30 min later - fasting - at least 30-60 min from b'fast - no Ca, Fe, MVI, + PPIs later in the day (>4h later) - Prilosec OTC - not on Biotin; on B12  Reviewed  TFTs: Lab Results  Component Value Date   TSH 0.89 11/27/2022   TSH 0.59 02/05/2022   TSH 0.64 01/12/2022   TSH 0.14 (L) 11/22/2021   TSH 0.45 01/03/2021   FREET4 0.99 11/27/2022   FREET4 0.86 01/12/2022   FREET4 1.18 11/22/2021   FREET4 0.99 01/03/2021   FREET4 1.31 11/22/2020   Pt denies: - feeling nodules in neck - hoarseness - dysphagia - choking  Review of Systems + see HPI  I reviewed pt's medications, allergies, PMH, social hx, family hx, and changes were documented in the history of present illness. Otherwise, unchanged from my initial visit note.  Past Medical History:  Diagnosis Date   Abnormal EKG    per patient- states evaluated by Dr Edith in the past   Allergy    sneezing  late Fall   Anemia 01/23/12   H/H 11.7/35   Anxiety state, unspecified    Arthritis    Benign paroxysmal positional vertigo    Depression    Depressive disorder, not elsewhere classified    Diverticulosis    Esophageal reflux    Esophageal stricture    Gallstones    Heart murmur    Hiatal hernia    History of hyperparathyroidism    History of UTI    HTN (hypertension)    Hyperlipidemia    OSA (obstructive sleep apnea) 05/27/2018   Other abnormal glucose    Personal history of other diseases of digestive system    12-15 since 1990, no recurrence 2006   PONV (postoperative nausea and vomiting)    Unspecified hypothyroidism    Vitamin D  deficiency    Past Surgical History:  Procedure Laterality Date   CATARACT EXTRACTION, BILATERAL Bilateral 12/06/2019   [facility]   CESAREAN SECTION  05/08/1975   CHOLECYSTECTOMY  05/07/2002   COLONOSCOPY  12/06/2003   [facility]   DENTAL SURGERY  1962, 2012   Age 73-Abscessed Teeth Extracted    PARATHYROIDECTOMY Left 02/13/2013   Procedure: LEFT INFERIOR PARATHYROIDECTOMY with frozen section ;  Surgeon: Krystal CHRISTELLA Spinner, MD;  Location: [facility];  Service: General;  Laterality: Left;   TOTAL ABDOMINAL HYSTERECTOMY W/ BILATERAL SALPINGOOPHORECTOMY   05/08/2003   Ovarian Cyst   Social History   Socioeconomic History   Marital status: Married    Spouse name: Not on file   Number of children: 1   Years of education: Not on file   Highest education level: Not on file  Occupational History   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    Employer: RETIRED   Occupation: retired Visual merchandiser  Tobacco Use   Smoking status: Former    Current packs/day: 0.00    Types: Cigarettes    Quit date: 05/07/1973    Years since quitting: 50.6   Smokeless tobacco: Never   Tobacco comments:  Smoked [phone] up to 2 ppd  Vaping Use   Vaping status: Never Used  Substance and Sexual Activity   Alcohol use: Yes    Alcohol/week: 14.0 standard drinks of alcohol    Types: 14 Standard drinks or equivalent per week    Comment: 2 nightly wines   Drug use: No   Sexual activity: Not Currently  Other Topics Concern   Not on file  Social History Narrative   Regular exercise: no   Caffeine use: daily   Social Drivers of Health   Financial Resource Strain: Low Risk  (01/18/2023)   Overall Financial Resource Strain (CARDIA)    Difficulty of Paying Living Expenses: Not hard at all  Food Insecurity: No Food Insecurity (01/18/2023)   Hunger Vital Sign    Worried About Running Out of Food in the Last Year: Never true    Ran Out of Food in the Last Year: Never true  Transportation Needs: No Transportation Needs (01/18/2023)   PRAPARE - Administrator, Civil Service (Medical): No    Lack of Transportation (Non-Medical): No  Physical Activity: Inactive (01/18/2023)   Exercise Vital Sign    Days of Exercise per Week: 0 days    Minutes of Exercise per Session: 0 min  Stress: Stress Concern Present (01/18/2023)   [employer] of Occupational Health - Occupational Stress Questionnaire    Feeling of Stress : Very much  Social Connections: Moderately Integrated (01/18/2023)   Social Connection and Isolation Panel    Frequency of Communication  with Friends and Family: More than three times a week    Frequency of Social Gatherings with Friends and Family: Three times a week    Attends Religious Services: 1 to 4 times per year    Active Member of Clubs or Organizations: No    Attends Banker Meetings: Never    Marital Status: Married  Catering manager Violence: Not At Risk (01/18/2023)   Humiliation, Afraid, Rape, and Kick questionnaire    Fear of Current or Ex-Partner: No    Emotionally Abused: No    Physically Abused: No    Sexually Abused: No   Current Outpatient Medications on File Prior to Visit  Medication Sig Dispense Refill   ALPRAZolam  (XANAX ) 0.5 MG tablet TAKE 0.5-1 TABLETS (0.25-0.5 MG TOTAL) BY MOUTH 2 (TWO) TIMES DAILY AS NEEDED FOR ANXIETY. 30 tablet 0   Cholecalciferol (VITAMIN D3) 2000 UNITS TABS Take 2 tablets by mouth daily.      cyanocobalamin 100 MCG tablet Take 100 mcg by mouth daily.     ezetimibe  (ZETIA ) 10 MG tablet Take 1 tablet (10 mg total) by mouth daily. 90 tablet 3   FLUoxetine  (PROZAC ) 20 MG capsule Take 3 capsules (60 mg total) by mouth daily. 270 capsule 3   levothyroxine  (SYNTHROID ) 75 MCG tablet Take 1 tablet (75 mcg total) by mouth daily. 90 tablet 3   losartan  (COZAAR ) 50 MG tablet Take 1 tablet (50 mg total) by mouth daily. 90 tablet 3   omeprazole  (PRILOSEC) 20 MG capsule Take 20 mg by mouth daily.     No current facility-administered medications on file prior to visit.   Allergies  Allergen Reactions   Augmentin  [Amoxicillin -Pot Clavulanate] Rash   Family History  Problem Relation Age of Onset   Coronary artery disease Father    Heart attack Father 50   COPD Father    Diabetes Mother    Dysrhythmia Mother    Depression Mother  COPD Mother    Colon polyps Mother    Kidney disease Mother    Prostate cancer Brother    Other Brother        Wegener's in sinus; [facility]   Heart disease Brother    Arthritis Brother    Hypertension Brother    Prostate cancer Brother     Cancer Brother        Soft tissue in the face/temple   Stroke Maternal Aunt        >65   Stroke Maternal Grandmother        >65   Stroke Maternal Grandfather 47    Objective:   Physical Exam BP 122/70   Pulse 65   Ht 5' 5 (1.651 m)   Wt 186 lb 6.4 oz (84.6 kg)   SpO2 98%   BMI 31.02 kg/m   Wt Readings from Last 15 Encounters:  12/05/23 186 lb 6.4 oz (84.6 kg)  06/10/23 188 lb (85.3 kg)  02/07/23 190 lb (86.2 kg)  01/18/23 189 lb 6.4 oz (85.9 kg)  11/27/22 192 lb 3.2 oz (87.2 kg)  06/13/22 190 lb 6.4 oz (86.4 kg)  02/05/22 191 lb 12.8 oz (87 kg)  01/16/22 188 lb 9.6 oz (85.5 kg)  11/22/21 187 lb 12.8 oz (85.2 kg)  08/04/21 194 lb (88 kg)  01/25/21 200 lb 6.4 oz (90.9 kg)  01/14/21 201 lb (91.2 kg)  11/22/20 201 lb 6.4 oz (91.4 kg)  11/04/20 199 lb 4 oz (90.4 kg)  08/03/20 203 lb 3.2 oz (92.2 kg)   Constitutional: overweight, in NAD Eyes:  EOMI, no exophthalmos ENT: no neck masses, no cervical lymphadenopathy Cardiovascular: RRR, No MRG Respiratory: CTA B Musculoskeletal: no deformities Skin:no rashes Neurological: no tremor with outstretched hands  Assessment:     1.  History of primary hyperparathyroidism - status post left superior parathyroidectomy 02/13/2013, by Dr. Eletha -Please see previous endocrinology notes  2. Thyroid  nodule - thyroid  ultrasound 10/08/2012: Thyroid  echotexture is heterogeneous.  Right lobe: Measures 3 x 1.2 x 1.1 cm.  Left lobe: Measures 2.9 x 1.9 x 0.9 cm.  Isthmus: Measures 0.4 cm.  Focal lesions: Within the lower pole of the right lobe of thyroid  gland there is a hypoechoic nodule containing coarsened calcifications. This measures 7 x 7 x 7 mm.  No lymphadenopathy identified. - thyroid  U/S 03/22/2014: Right thyroid  lobe: 3.4 x 1.1 x 1.0 cm. Stable to slightly smaller focal area of nodularity with dystrophic shadowing calcifications measures approximately 0.6 cm in diameter. The thyroid  parenchyma is very heterogeneous and  shows no abnormal vascularity. Left thyroid  lobe: 2.9 x 1.1 x 1.1 cm. No nodules visualized. Heterogeneous parenchyma noted without abnormal vascularity. The left lobe is small in size.  Isthmus Thickness: 0.9 cm.  No nodules visualized. Lymphadenopathy None visualized.  3. Hypothyroidism   4. vitamin D  deficiency  5. OP  Plan:     1.  History of primary Hyperparathyroidism: -Patient with history of primary hyperparathyroidism with minimally elevated calcium but high PTH levels in the past.  She is now s/p parathyroidectomy in 02/2013 by Dr. Eletha.  At that time, she had resection of a 0.5 g adenoma.  Subsequent calcium and PTH levels were normal: - Most recent calcium levels were normal Lab Results  Component Value Date   CALCIUM 9.1 02/07/2023   CALCIUM 9.2 02/05/2022  - She can continue to follow-up with PCP with annual calcium levels on BMP at annual physical exams  2. R thyroid  nodule -She  had a 7 mm thyroid  nodule with internal calcifications which was stable and even smaller on the latest ultrasound -No neck compression symptoms -Follow-up ultrasound needed unless she becomes symptomatic with neck compression symptoms or a nodule becomes obvious on palpation  3. Hypothyroidism - latest thyroid  labs reviewed with pt. >> normal: Lab Results  Component Value Date   TSH 0.89 11/27/2022  - she continues on LT4 75 mcg daily - pt feels good on this dose, except for fatigue, which is chronic for her and may be related to caregiver status. - we discussed about taking the thyroid  hormone every day, with water, >30 minutes before breakfast, separated by >4 hours from acid reflux medications, calcium, iron, multivitamins. Pt. is taking it correctly. - will check thyroid  tests today: TSH and fT4 - If labs are abnormal, she will need to return for repeat TFTs in 1.5 months  4. Vit D deficiency - MND level was normal at last check: Lab Results  Component Value Date   VD25OH 66.45  11/27/2022  - Continues on 4000 units vitamin D  daily - We will recheck the vitamin D  level today  5.  Osteoporosis  - Patient with a history of osteoporosis at the radial site before her parathyroid  surgery. DXA scan from 2019 showed an improvement in the T-scores, however, they were checked on another bone density machine.  She had a repeat DXA scan in 11/2018 and this showed worsening of her spine and hips T-scores.  At that time, we discussed about possible treatment for osteoporosis and we discussed at length about medication classes, benefits, and side effects.  Her co-pay for Prolia injection was high, $255.  She declined the injection.  She also declined p.o. or IV bisphosphonates. - She had another bone density scan performed in 2022 and this showed a significant decrease in bone density at the left femoral neck, but otherwise nonsignificant changes.  At that time, we again discussed about possibly starting medication and she agreed to try Fosamax .  I advised her to take it with a full glass of water and not lay down for an hour after taking it.  Also, to take it 30 minutes before levothyroxine .  She did start Fosamax  and continues it without jaw/thigh/hip pain. - We obtained another bone density scan 12/2022 and this showed a decrease in T-score at the level of the 33% distal radius but otherwise stability at the spine and femoral necks.  She did have an ultra distal radius T-score that was quite low, at -3.8.  B Based on these results, I again suggested Prolia, but she continued Fosamax  >> now off for few months.  We discussed about trying to restart this and set an alarm to take it every Sunday morning.  However, I again suggested Prolia and she agrees to start this if affordable. - I encouraged her to do weightbearing exercises 5 out of 7 days  - Plan to obtain another bone density next year  Needs refills LT4.  Orders Placed This Encounter  Procedures   TSH   T4, free   VITAMIN D  25  Hydroxy (Vit-D Deficiency, Fractures)   Basic Metabolic Panel Without GFR    Lela Fendt, MD PhD [facility]

## 2023-12-05 NOTE — Patient Instructions (Addendum)
 Please continue: - Vitamin D  4000 units daily. - Levothyroxine  75 mcg daily.  Take the thyroid  hormone every day, with water, at least 30 minutes before breakfast, separated by at least 4 hours from: - acid reflux medications - calcium - iron - multivitamins  Restart: - Alendronate  70 mg weekly.  We will try Prolia again.  Try to do strength exercises for 30 min 5/7 days.  Please stop at the lab.  Please come back for a follow-up appointment in 1 year.

## 2023-12-06 ENCOUNTER — Ambulatory Visit: Payer: Self-pay | Admitting: Internal Medicine

## 2023-12-06 ENCOUNTER — Encounter: Payer: Self-pay | Admitting: Internal Medicine

## 2023-12-06 MED ORDER — LEVOTHYROXINE SODIUM 75 MCG PO TABS: 75.0000 ug | ORAL_TABLET | Freq: Every day | ORAL | 3 refills | Status: AC

## 2023-12-06 NOTE — Addendum Note (Signed)
 Addended by: TRIXIE FILE on: 12/06/2023 02:10 PM   Modules accepted: Orders

## 2023-12-12 ENCOUNTER — Telehealth: Payer: Self-pay

## 2023-12-12 ENCOUNTER — Ambulatory Visit (INDEPENDENT_AMBULATORY_CARE_PROVIDER_SITE_OTHER): Admitting: Clinical

## 2023-12-12 DIAGNOSIS — F331 Major depressive disorder, recurrent, moderate: Secondary | ICD-10-CM | POA: Diagnosis not present

## 2023-12-12 DIAGNOSIS — F411 Generalized anxiety disorder: Secondary | ICD-10-CM

## 2023-12-12 NOTE — Telephone Encounter (Signed)
 Auth Submission: DENIED Site of care: Site of care: [facility] Payer: [employer] medicare Medication & CPT/J Code(s) submitted: Prolia (Denosumab) [id number] Diagnosis Code:  Route of submission (phone, fax, portal): portal  Authorization has been DENIED because she must try and fail a preferred injectable such as Ibandronate, Pamidronate or Zoledronic acid.  Provider is aware and patient will continue Fosamax  for now.

## 2023-12-12 NOTE — Progress Notes (Signed)
 Diagnosis: F33.1, Major Depression, Recurrent, Moderate,  F41.1 Time of session: 2:00 pm-3:00 pm CPT Code: 09162E (in person)  Gail Fuentes was seen in person for individual therapy. She reported continued stress related to caring for her husband. She reflected upon the difficulty of adjusting to change, and therapist pointed out that it has been difficult to allow herself to adjust her expectations of herself in light of recent changes. Therapist engaged her in discussion of how she might do this. She is scheduled to be seen again in three weeks.    Treatment Plan Client Abilities/Strengths  Gail Fuentes presented as motivated to engage in therapy and able to describe her challenges.  Client Treatment Preferences  Gail Fuentes prefers in-person appointments  Client Statement of Needs  Gail Fuentes is seeking cognitive behavioral therapy to manage symptoms of depression and anxiety.  Treatment Level  Biweekly  Symptoms  Anxiety: Excessive worry, sleep irregularity, difficulty relaxing (Status: maintained). Depression:  Depressed mood, lack of motivation, withdrawal from previously enjoyed activities, hopelessness  (Status: maintained).  Problems Addressed  New Description, New Description  Goals 1. Gail Fuentes has a history of recurrent depression Objective Gail Fuentes will increase self esteem and self compassion Target Date: 2024-01-17 Frequency: Daily Progress: 50 Modality: individual Objective Gail Fuentes will reduce anxious planning behavior and accept her personal limitations Progress: 50% Target Date: 2024-01-17 Signed by: Andriette Ponto, PhD Version 2 Signed on: 2021-03-16 11:22:00 -0500 Generated on: 2022-12-07T15:59:08Z [name] [facility]) Gail Fuentes DOB: 02-14-1946  Objective Gail Fuentes will improve her overall mood and sense of well-being Target Date: 2024-01-17 Frequency: Biweekly Progress: 50 Modality: individual Related Interventions 1. Therapist will provide Gail Fuentes with opportunities to  process her experiences in session 2. Therapist will help Gail Fuentes to identify and disengage from maladaptive thought and behavior  patterns using CBT based strategies 3. Therapist will engage Gail Fuentes in behavior activation, including incorporation of structure,  pleasant events, and mastery events into her routine 4. Therapist will provide referrals for additional resources as appropriate  2. Gail Fuentes has difficulty setting boundaries in relationships and  prioritizing self-care Objective Gail Fuentes will develop strategies to support healthy communication and boundary setting in relationships Target Date: 2024-01-17 Frequency: Biweekly Progress: 50 Modality: individual Related Interventions 1. Therapist will provide Gail Fuentes with communication strategies and opportunities to practice,  including writing down her thoughts, in-session processing of interactions, and in-vivo role  plays Diagnosis Axis  none 300.02 (Generalized anxiety disorder) - Open - [Signifier: n/a]  Axis  none 296.32 (Major depressive affective disorder, recurrent episode, moderate) - Open -  [Signifier: n/a]  Signed by: Andriette Ponto, PhD Version 2 Signed on: 2021-03-16 11:22:00 -0500 Generated on: 2022-12-07T15:59:08Z [name] Gail Fuentes Gail Fuentes DOB: 1946/03/12 Provider: Andriette Ponto, PhD Created on: 2021-03-16 11:10:24 -0500 [facility] [address] [place], [place] [address] [phone] Conditions For Discharge Achievement of treatment goals and objective  Presenting Problem Gail Fuentes reports a history of recurrent depression coupled with bouts of anxiety that has kept her in and out of therapy for the past 30 years. She also described herself has having a savior complex that can make it difficult to set healthy boundaries and take care of herself.  Symptoms depressed mood, excessive worry, lack of motivation, feelings of hopelessness about the future. Suicidal ideation and  intent were denied in risk assessment History of Problem  Gail Fuentes shared that she has experienced depression on and off for much of her life. Most recently, depression and anxiety has centered on her son and grandson.  Recent Trigger  [name] shared  that her self care especially suffered during COVID, and symptoms of depression have increased as she has reflected upon her age and the next segment of her life. This has also triggered thoughts and fears related to the end of her parents' lives.  Marital and Family Information  Present family concerns/problems: Gail Fuentes frequently worries for her son and grandson.  Strengths/resources in the family/friends: Gail Fuentes described a supportive relationship with her husband.  Marital/sexual history patterns: Gail Fuentes has been married for over 50 years and reported she rarely fights or argues with her husband. However, she also shared that both are conflict avoidant. She has been increasingly concerned about her husband's cognitive functioning in 2023 and 2024. Family of Origin  Problems in family of origin: Gail Fuentes is the oldest of 4. Her father left the family for extended periods of time, leaving Gail Fuentes with significant responsibilities in terms of helping her mother manage the household. When she was 85, she became pregnant, and her father sent her away from the home. She gave this baby up for adoption and has never told her son about this, although her husband knows.  Family background / ethnic factors: Gail Fuentes reported that she lived in a household with significant financial struggles growing up.  No needs/concerns related to ethnicity reported when asked: No  Education/Vocation  Interpersonal concerns/problems: Gail Fuentes has withdrawn socially since COVID, but reported that she continues to enjoy participating in activities with her church.  Personal strengths: Gail Fuentes presented as motivated to engage in therapy, and has found it helpful in the past.  Military/work  problems/concerns: None noted.  Leisure Activities/Daily Functioning  decreased interest  Legal Status  No Legal Problems  Medical/Nutritional Concerns  no problems  Comments: Gail Fuentes is prescribed prozac  and synthroid .  Substance use/abuse/dependence  unspecified  Comments: None reported  Religion/Spirituality  believes in god  Other  General Behavior: cooperative  Attire: appropriate  Gait: normal  Motor Activity: normal  Stream of Thought - Productivity: spontaneous  Stream of thought - Progression: normal  Stream of thought - Language: normal  Emotional tone and reactions - Mood: normal  Emotional tone and reactions - Affect: appropriate  Mental trend/Content of thoughts - Perception: normal  Mental trend/Content of thoughts - Orientation: normal  Mental trend/Content of thoughts - Memory: normal  Mental trend/Content of thoughts - General knowledge: consistent with education  Insight: good  Judgment: good        Andriette LITTIE Ponto, PhD               Andriette LITTIE Ponto, PhD

## 2024-01-01 ENCOUNTER — Ambulatory Visit: Admitting: Clinical

## 2024-01-17 NOTE — Patient Instructions (Addendum)
 Good to see you today  Recommend dose of RSV, shingrix shingles series, flu shot it not done already   Let's try tapering off fluoxetine - go down to 40 mg for 3 days, then 20 mg for 3 days- then you can stop and change over to venlefaxine 75 mg daily You can increase to 150 mg after 10 days or so as you see fit  Please keep me posted I will refer you to [facility] to look at your hip Please do work on making a plan for the future - I don't think you will be able to take care of your husband permanently with his memory loss

## 2024-01-17 NOTE — Progress Notes (Addendum)
  Healthcare at [facility] [address] [place], [place] [address] [phone]  Date:  01/22/2024   Name:  Gail Fuentes   DOB:  1946/02/26   MRN:  [id number]  PCP:  Watt Harlene BROCKS, MD    Chief Complaint: Annual Exam   History of Present Illness:  Gail Fuentes is a 78 y.o. very pleasant female patient who presents with the following:  Pt seen today for a CPE I last saw her about one year ago  History of cardiomegaly, hypertension, sleep apnea, hypothyroidism, hyperlipidemia, vitamin D  deficiency and osteoporosis, mild pre-diabetes  She sees Dr Trixie for history of hyperparathyroidism  Shingirx-recommended Flu shot- done  Recommend covid booster Recommend dose of RSV   She did her MWE today- notes per CMA:  I just saw [name]. She scored 13 on PHQ-9. 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 She is feeling overwhelmed. Already has a therapist. Also notes flare up of pain in left buttock radiating down her leg today.  I ordered her mammogram and gave flu vaccine today.   Discussed the use of AI scribe software for clinical note transcription with the patient, who gave verbal consent to proceed.  History of Present Illness Gail Fuentes is a 78 year old female who presents with left leg pain and stress related to caregiving responsibilities.  She experiences persistent left leg pain that radiates from the hip to the thigh and occasionally extends to the right side. There is a sensation of heaviness in her bottom, and she has developed a limp. The pain improves with activity and worsens with rest. She suspects her mattress might contribute to the discomfort but is uncertain.  She had hip films last year which did show degenerative changes. She has recently experienced urinary incontinence-dribbling  She is under significant stress due to her  husband's health issues. Her husband underwent an aortic valve replacement in December and has shown signs of dementia-he needs supervision really all the time.. She manages all household responsibilities and decision-making, as her husband cannot drive or manage finances. She feels overwhelmed   She has been on fluoxetine  for depression since her fifties but feels it may not be effective anymore. She previously tried Wellbutrin , which caused anxiety. She sees a therapist every two to three weeks, although she had to cancel her last appointment due to her husband's needs. No suicidal ideation, but she expresses concern about her future and the increasing demands on her.  She has one son who is supportive but overwhelmed with his own responsibilities. She has not sought help from others but acknowledges the need to start doing so. She and her husband live independently, but she is considering future living arrangements that might offer more support.       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   History of hyperparathyroidism 08/24/2014   Obesity (BMI 30-39.9) 10/08/2013   HTN (hypertension) 07/13/2013   Elevated blood pressure reading without diagnosis of hypertension 06/29/2013   Osteoporosis 11/14/2012   Acute iritis of both eyes 10/17/2012  Thyroid  nodule 10/17/2012   Cardiomegaly 02/20/2012   DISTURBANCES OF SENSATION OF SMELL AND TASTE 04/19/2010   Vitamin D  deficiency 12/24/2008   ANXIETY 12/24/2008   DEPRESSION 12/24/2008   BENIGN PAROXYSMAL POSITIONAL VERTIGO 12/24/2008   HYPERGLYCEMIA 12/24/2008   Nonspecific abnormal electrocardiogram (ECG) (EKG) 12/24/2008    Hyperlipidemia 07/23/2007   Hypothyroidism 07/22/2007   GERD 07/22/2007   DIVERTICULITIS, HX OF 07/22/2007    Past Medical History:  Diagnosis Date   Abnormal EKG    per patient- states evaluated by Dr Edith in the past   Allergy    sneezing  late Fall   Anemia 01/23/12   H/H 11.7/35   Anxiety state, unspecified    Arthritis    Benign paroxysmal positional vertigo    Depression    Depressive disorder, not elsewhere classified    Diverticulosis    Esophageal reflux    Esophageal stricture    Gallstones    Heart murmur    Hiatal hernia    History of hyperparathyroidism    History of UTI    HTN (hypertension)    Hyperlipidemia    OSA (obstructive sleep apnea) 05/27/2018   Other abnormal glucose    Personal history of other diseases of digestive system    12-15 since 1990, no recurrence 2006   PONV (postoperative nausea and vomiting)    Unspecified hypothyroidism    Vitamin D  deficiency     Past Surgical History:  Procedure Laterality Date   CATARACT EXTRACTION, BILATERAL Bilateral 12/06/2019   [facility]   CESAREAN SECTION  05/08/1975   CHOLECYSTECTOMY  05/07/2002   COLONOSCOPY  12/06/2003   [facility]   DENTAL SURGERY  1962, 2012   Age 23-Abscessed Teeth Extracted    PARATHYROIDECTOMY Left 02/13/2013   Procedure: LEFT INFERIOR PARATHYROIDECTOMY with frozen section ;  Surgeon: Krystal CHRISTELLA Spinner, MD;  Location: [facility];  Service: General;  Laterality: Left;   TOTAL ABDOMINAL HYSTERECTOMY W/ BILATERAL SALPINGOOPHORECTOMY  05/08/2003   Ovarian Cyst    Social History   Tobacco Use   Smoking status: Former    Current packs/day: 0.00    Types: Cigarettes    Quit date: 05/07/1973    Years since quitting: 50.7   Smokeless tobacco: Never   Tobacco comments:    Smoked 1968-1975 up to 2 ppd  Vaping Use   Vaping status: Never Used  Substance Use Topics   Alcohol use: Yes    Alcohol/week: 14.0 standard drinks of alcohol    Types: 14 Standard drinks or equivalent per week     Comment: 2 nightly wines   Drug use: No    Family History  Problem Relation Age of Onset   Diabetes Mother    Dysrhythmia Mother    Depression Mother    COPD Mother    Colon polyps Mother    Kidney disease Mother    Coronary artery disease Father    Heart attack Father 53   COPD Father    Prostate cancer Brother    Other Brother        Wegener's in sinus; [facility]   Heart disease Brother    Arthritis Brother    Hypertension Brother    Prostate cancer Brother    Cancer Brother        Soft tissue in the face/temple   Stroke Maternal Aunt        >65   Stroke Maternal Grandmother        >65   Stroke Maternal Grandfather 4  Dementia Other     Allergies  Allergen Reactions   Augmentin  [Amoxicillin -Pot Clavulanate] Rash    Medication list has been reviewed and updated.  Current Outpatient Medications on File Prior to Visit  Medication Sig Dispense Refill   Cholecalciferol (VITAMIN D3) 2000 UNITS TABS Take 2 tablets by mouth daily.      cyanocobalamin 100 MCG tablet Take 100 mcg by mouth daily.     levothyroxine  (SYNTHROID ) 75 MCG tablet Take 1 tablet (75 mcg total) by mouth daily. 90 tablet 3   losartan  (COZAAR ) 50 MG tablet Take 1 tablet (50 mg total) by mouth daily. 90 tablet 3   omeprazole  (PRILOSEC) 20 MG capsule Take 20 mg by mouth daily.     ezetimibe  (ZETIA ) 10 MG tablet Take 1 tablet (10 mg total) by mouth daily. (Patient not taking: Reported on 01/22/2024) 90 tablet 3   No current facility-administered medications on file prior to visit.    Review of Systems:  As per HPI- otherwise negative.   Physical Examination: Vitals:   01/22/24 1023  BP: 133/83  Pulse: (!) 57  Temp: 97.9 F (36.6 C)  SpO2: 94%   Vitals:   01/22/24 1023  Weight: 186 lb 6.4 oz (84.6 kg)  Height: 5' 5 (1.651 m)   Body mass index is 31.02 kg/m. Ideal Body Weight: Weight in (lb) to have BMI = 25: 149.9  GEN: no acute distress.  Mildly obese, looks well HEENT: Atraumatic,  Normocephalic.  Ears and Nose: No external deformity. CV: RRR, No M/G/R. No JVD. No thrill. No extra heart sounds. PULM: CTA B, no wheezes, crackles, rhonchi. No retractions. No resp. distress. No accessory muscle use. ABD: S, NT, ND, +BS. No rebound. No HSM. EXTR: No c/c/e PSYCH: Normally interactive. Conversant.  Left hip displays decreased range of motion to flexion, she has discomfort with flexed adduction.  Hip rotation is also decreased  Assessment and Plan: Physical exam  Essential hypertension - Plan: CBC, Comprehensive metabolic panel with GFR  History of hyperparathyroidism - Plan: Comprehensive metabolic panel with GFR  Screening for diabetes mellitus - Plan: Hemoglobin A1c  Mixed hyperlipidemia - Plan: Lipid panel  Thyroid  disorder screening - Plan: TSH  Caregiver stress - Plan: venlafaxine  XR (EFFEXOR -XR) 75 MG 24 hr capsule  Left hip pain - Plan: Ambulatory referral to Orthopedic Surgery  Depression, recurrent (HCC) - Plan: ALPRAZolam  (XANAX ) 0.5 MG tablet Physical exam today.  Encouraged healthy diet and exercise routine Encouraged appropriate immunizations Flu shot done earlier today  Blood pressure well-controlled on current medications Check thyroid , can adjust levothyroxine  if needed Follow-up on calcium, history of hyperparathyroidism Assessment & Plan Left hip and thigh pain with suspected left hip osteoarthritis Chronic pain likely due to osteoarthritis with radiating symptoms. X-rays showed deterioration, indicating worsening arthritis. - Refer to orthopedist for evaluation of hip arthritis.  Major depressive disorder and anxiety disorder Chronic depression and anxiety exacerbated by stressors. Current fluoxetine  treatment ineffective. Wellbutrin  not tolerated. Discussed venlafaxine  switch with fluoxetine  taper. - Discontinue fluoxetine  with a taper over one week. - Initiate venlafaxine  (Effexor ) and monitor response over 4-6 weeks. - Continue  counseling sessions with current therapist.  Urinary incontinence, mild Mild urinary incontinence with recent onset.  Suspect this may have to do with her hip and difficulty getting around  Nasal skin lesion, unspecified Unspecified nasal skin lesion with discoloration. Dermatology visit attempted but not completed. - Recommend dermatology evaluation for nasal skin lesion.  Signed Harlene Schroeder, MD  Received labs, message to patient  Results for orders placed or performed in visit on 01/22/24  CBC   Collection Time: 01/22/24 11:06 AM  Result Value Ref Range   WBC 6.9 4.0 - 10.5 K/uL   RBC 4.58 3.87 - 5.11 Mil/uL   Platelets 323.0 150.0 - 400.0 K/uL   Hemoglobin 14.2 12.0 - 15.0 g/dL   HCT 57.1 63.9 - 53.9 %   MCV 93.5 78.0 - 100.0 fl   MCHC 33.2 30.0 - 36.0 g/dL   RDW 87.2 88.4 - 84.4 %  Comprehensive metabolic panel with GFR   Collection Time: 01/22/24 11:06 AM  Result Value Ref Range   Sodium 136 135 - 145 mEq/L   Potassium 5.4 No hemolysis seen (H) 3.5 - 5.1 mEq/L   Chloride 101 96 - 112 mEq/L   CO2 25 19 - 32 mEq/L   Glucose, Bld 87 70 - 99 mg/dL   BUN 10 6 - 23 mg/dL   Creatinine, Ser 9.15 0.40 - 1.20 mg/dL   Total Bilirubin 0.7 0.2 - 1.2 mg/dL   Alkaline Phosphatase 66 39 - 117 U/L   AST 37 0 - 37 U/L   ALT 28 0 - 35 U/L   Total Protein 6.5 6.0 - 8.3 g/dL   Albumin 4.2 3.5 - 5.2 g/dL   GFR 32.75 >39.99 mL/min   Calcium 10.0 8.4 - 10.5 mg/dL  Hemoglobin J8r   Collection Time: 01/22/24 11:06 AM  Result Value Ref Range   Hgb A1c MFr Bld 5.8 4.6 - 6.5 %  Lipid panel   Collection Time: 01/22/24 11:06 AM  Result Value Ref Range   Cholesterol 254 (H) 0 - 200 mg/dL   Triglycerides 26.9 0.0 - 149.0 mg/dL   HDL 15.79 >60.99 mg/dL   VLDL 85.3 0.0 - 59.9 mg/dL   LDL Cholesterol 843 (H) 0 - 99 mg/dL   Total CHOL/HDL Ratio 3    NonHDL 170.27   TSH   Collection Time: 01/22/24 11:06 AM  Result Value Ref Range   TSH 1.29 0.35 - 5.50 uIU/mL

## 2024-01-22 ENCOUNTER — Encounter: Payer: Self-pay | Admitting: Family Medicine

## 2024-01-22 ENCOUNTER — Ambulatory Visit: Admitting: *Deleted

## 2024-01-22 ENCOUNTER — Ambulatory Visit: Admitting: Family Medicine

## 2024-01-22 VITALS — BP 133/83 | HR 57 | Temp 97.9°F | Ht 65.0 in | Wt 186.4 lb

## 2024-01-22 VITALS — BP 133/83 | HR 57 | Temp 97.9°F | Resp 16 | Ht 65.0 in | Wt 186.4 lb

## 2024-01-22 DIAGNOSIS — F339 Major depressive disorder, recurrent, unspecified: Secondary | ICD-10-CM

## 2024-01-22 DIAGNOSIS — M25552 Pain in left hip: Secondary | ICD-10-CM

## 2024-01-22 DIAGNOSIS — E213 Hyperparathyroidism, unspecified: Secondary | ICD-10-CM | POA: Diagnosis not present

## 2024-01-22 DIAGNOSIS — E782 Mixed hyperlipidemia: Secondary | ICD-10-CM

## 2024-01-22 DIAGNOSIS — I10 Essential (primary) hypertension: Secondary | ICD-10-CM | POA: Diagnosis not present

## 2024-01-22 DIAGNOSIS — E875 Hyperkalemia: Secondary | ICD-10-CM

## 2024-01-22 DIAGNOSIS — Z8639 Personal history of other endocrine, nutritional and metabolic disease: Secondary | ICD-10-CM

## 2024-01-22 DIAGNOSIS — Z0000 Encounter for general adult medical examination without abnormal findings: Secondary | ICD-10-CM | POA: Diagnosis not present

## 2024-01-22 DIAGNOSIS — Z23 Encounter for immunization: Secondary | ICD-10-CM

## 2024-01-22 DIAGNOSIS — Z636 Dependent relative needing care at home: Secondary | ICD-10-CM

## 2024-01-22 DIAGNOSIS — R32 Unspecified urinary incontinence: Secondary | ICD-10-CM

## 2024-01-22 DIAGNOSIS — Z1329 Encounter for screening for other suspected endocrine disorder: Secondary | ICD-10-CM | POA: Diagnosis not present

## 2024-01-22 DIAGNOSIS — Z131 Encounter for screening for diabetes mellitus: Secondary | ICD-10-CM

## 2024-01-22 DIAGNOSIS — Z1231 Encounter for screening mammogram for malignant neoplasm of breast: Secondary | ICD-10-CM

## 2024-01-22 LAB — COMPREHENSIVE METABOLIC PANEL WITH GFR
ALT: 28 U/L (ref 0–35)
AST: 37 U/L (ref 0–37)
Albumin: 4.2 g/dL (ref 3.5–5.2)
Alkaline Phosphatase: 66 U/L (ref 39–117)
BUN: 10 mg/dL (ref 6–23)
CO2: 25 meq/L (ref 19–32)
Calcium: 10 mg/dL (ref 8.4–10.5)
Chloride: 101 meq/L (ref 96–112)
Creatinine, Ser: 0.84 mg/dL (ref 0.40–1.20)
GFR: 67.24 mL/min (ref >60.00)
Glucose, Bld: 87 mg/dL (ref 70–99)
Potassium: 5.4 meq/L — ABNORMAL HIGH (ref 3.5–5.1)
Sodium: 136 meq/L (ref 135–145)
Total Bilirubin: 0.7 mg/dL (ref 0.2–1.2)
Total Protein: 6.5 g/dL (ref 6.0–8.3)

## 2024-01-22 LAB — CBC
HCT: 42.8 % (ref 36.0–46.0)
Hemoglobin: 14.2 g/dL (ref 12.0–15.0)
MCHC: 33.2 g/dL (ref 30.0–36.0)
MCV: 93.5 fl (ref 78.0–100.0)
Platelets: 323 K/uL (ref 150.0–400.0)
RBC: 4.58 Mil/uL (ref 3.87–5.11)
RDW: 12.7 % (ref 11.5–15.5)
WBC: 6.9 K/uL (ref 4.0–10.5)

## 2024-01-22 LAB — LIPID PANEL
Cholesterol: 254 mg/dL — ABNORMAL HIGH (ref 0–200)
HDL: 84.2 mg/dL (ref >39.00)
LDL Cholesterol: 156 mg/dL — ABNORMAL HIGH (ref 0–99)
NonHDL: 170.27
Total CHOL/HDL Ratio: 3
Triglycerides: 73 mg/dL (ref 0.0–149.0)
VLDL: 14.6 mg/dL (ref 0.0–40.0)

## 2024-01-22 LAB — HEMOGLOBIN A1C: Hgb A1c MFr Bld: 5.8 % (ref 4.6–6.5)

## 2024-01-22 LAB — TSH: TSH: 1.29 u[IU]/mL (ref 0.35–5.50)

## 2024-01-22 MED ORDER — ALPRAZOLAM 0.5 MG PO TABS: ORAL_TABLET | ORAL | 1 refills | Status: AC

## 2024-01-22 MED ORDER — VENLAFAXINE HCL ER 75 MG PO CP24: 75.0000 mg | ORAL_CAPSULE | Freq: Every day | ORAL | 1 refills | Status: DC

## 2024-01-22 NOTE — Progress Notes (Signed)
 Subjective:   Gail Fuentes is a 78 y.o. who presents for a Medicare Wellness preventive visit.  As a reminder, Annual Wellness Visits don't include a physical exam, and some assessments may be limited, especially if this visit is performed virtually. We may recommend an in-person follow-up visit with your provider if needed.  Visit Complete: In person  Persons Participating in Visit: Patient.  AWV Questionnaire: No: Patient Medicare AWV questionnaire was not completed prior to this visit.  Cardiac Risk Factors include: advanced age (>34men, >69 women);hypertension;dyslipidemia;Other (see comment), Risk factor comments: OSA     Objective:    Today's Vitals   01/22/24 0941  BP: 133/83  Pulse: (!) 57  Resp: 16  Temp: 97.9 F (36.6 C)  TempSrc: Oral  SpO2: 94%  Weight: 186 lb 6.4 oz (84.6 kg)  Height: 5' 5 (1.651 m)   Body mass index is 31.02 kg/m.     01/22/2024   10:09 AM 01/18/2023    9:42 AM 01/16/2022    8:24 AM 01/14/2021   10:08 AM 11/27/2018    3:07 PM 11/13/2018    1:15 PM 11/22/2017   10:15 AM  Advanced Directives  Does Patient Have a Medical Advance Directive? Yes Yes Yes Yes Yes Yes Yes   Type of Estate agent of [place];Living will Healthcare Power of [employer] of [place];Living will Healthcare Power of [place];Living will Healthcare Power of [place];Living will  Healthcare Power of [place];Living will  Does patient want to make changes to medical advance directive? No - Patient declined No - Patient declined No - Patient declined  No - Patient declined  No - Patient declined    Copy of Healthcare Power of Attorney in Chart? No - copy requested No - copy requested No - copy requested No - copy requested No - copy requested   No - copy requested   Would patient like information on creating a medical advance directive?   No - Patient declined         Data saved with a previous flowsheet row definition    Current  Medications (verified) Outpatient Encounter Medications as of 01/22/2024  Medication Sig   Cholecalciferol (VITAMIN D3) 2000 UNITS TABS Take 2 tablets by mouth daily.    cyanocobalamin 100 MCG tablet Take 100 mcg by mouth daily.   levothyroxine  (SYNTHROID ) 75 MCG tablet Take 1 tablet (75 mcg total) by mouth daily.   losartan  (COZAAR ) 50 MG tablet Take 1 tablet (50 mg total) by mouth daily.   omeprazole  (PRILOSEC) 20 MG capsule Take 20 mg by mouth daily.   [DISCONTINUED] ALPRAZolam  (XANAX ) 0.5 MG tablet TAKE 0.5-1 TABLETS (0.25-0.5 MG TOTAL) BY MOUTH 2 (TWO) TIMES DAILY AS NEEDED FOR ANXIETY.   [DISCONTINUED] FLUoxetine  (PROZAC ) 20 MG capsule Take 3 capsules (60 mg total) by mouth daily.   ezetimibe  (ZETIA ) 10 MG tablet Take 1 tablet (10 mg total) by mouth daily. (Patient not taking: Reported on 01/22/2024)   No facility-administered encounter medications on file as of 01/22/2024.    Allergies (verified) Augmentin  [amoxicillin -pot clavulanate]   History: Past Medical History:  Diagnosis Date   Abnormal EKG    per patient- states evaluated by Dr Edith in the past   Allergy    sneezing  late Fall   Anemia 01/23/12   H/H 11.7/35   Anxiety state, unspecified    Arthritis    Benign paroxysmal positional vertigo    Depression    Depressive disorder, not elsewhere classified  Diverticulosis    Esophageal reflux    Esophageal stricture    Gallstones    Heart murmur    Hiatal hernia    History of hyperparathyroidism    History of UTI    HTN (hypertension)    Hyperlipidemia    OSA (obstructive sleep apnea) 05/27/2018   Other abnormal glucose    Personal history of other diseases of digestive system    12-15 since 1990, no recurrence 2006   PONV (postoperative nausea and vomiting)    Unspecified hypothyroidism    Vitamin D  deficiency    Past Surgical History:  Procedure Laterality Date   CATARACT EXTRACTION, BILATERAL Bilateral 12/06/2019   [facility]   CESAREAN SECTION  05/08/1975    CHOLECYSTECTOMY  05/07/2002   COLONOSCOPY  12/06/2003   [facility]   DENTAL SURGERY  1962, 2012   Age 41-Abscessed Teeth Extracted    PARATHYROIDECTOMY Left 02/13/2013   Procedure: LEFT INFERIOR PARATHYROIDECTOMY with frozen section ;  Surgeon: Krystal CHRISTELLA Spinner, MD;  Location: [facility];  Service: General;  Laterality: Left;   TOTAL ABDOMINAL HYSTERECTOMY W/ BILATERAL SALPINGOOPHORECTOMY  05/08/2003   Ovarian Cyst   Family History  Problem Relation Age of Onset   Diabetes Mother    Dysrhythmia Mother    Depression Mother    COPD Mother    Colon polyps Mother    Kidney disease Mother    Coronary artery disease Father    Heart attack Father 60   COPD Father    Prostate cancer Brother    Other Brother        Wegener's in sinus; [facility]   Heart disease Brother    Arthritis Brother    Hypertension Brother    Prostate cancer Brother    Cancer Brother        Soft tissue in the face/temple   Stroke Maternal Aunt        >65   Stroke Maternal Grandmother        >65   Stroke Maternal Grandfather 71   Dementia Other    Social History   Socioeconomic History   Marital status: Married    Spouse name: Not on file   Number of children: 1   Years of education: Not on file   Highest education level: Not on file  Occupational History   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    Employer: RETIRED   Occupation: retired Visual merchandiser  Tobacco Use   Smoking status: Former    Current packs/day: 0.00    Types: Cigarettes    Quit date: 05/07/1973    Years since quitting: 50.7   Smokeless tobacco: Never   Tobacco comments:    Smoked 1968-1975 up to 2 ppd  Vaping Use   Vaping status: Never Used  Substance and Sexual Activity   Alcohol use: Yes    Alcohol/week: 14.0 standard drinks of alcohol    Types: 14 Standard drinks or equivalent per week    Comment: 2 nightly wines   Drug use: No   Sexual activity: Not Currently  Other Topics Concern   Not on file  Social History Narrative   Regular  exercise: no   Caffeine use: daily   Social Drivers of Health   Financial Resource Strain: Low Risk  (01/22/2024)   Overall Financial Resource Strain (CARDIA)    Difficulty of Paying Living Expenses: Not very hard  Food Insecurity: No Food Insecurity (01/22/2024)   Hunger Vital Sign    Worried About Running Out of  Food in the Last Year: Never true    Ran Out of Food in the Last Year: Never true  Transportation Needs: No Transportation Needs (01/22/2024)   PRAPARE - Administrator, Civil Service (Medical): No    Lack of Transportation (Non-Medical): No  Physical Activity: Inactive (01/22/2024)   Exercise Vital Sign    Days of Exercise per Week: 0 days    Minutes of Exercise per Session: 0 min  Stress: Stress Concern Present (01/22/2024)   [employer] of Occupational Health - Occupational Stress Questionnaire    Feeling of Stress: Rather much  Social Connections: Moderately Integrated (01/22/2024)   Social Connection and Isolation Panel    Frequency of Communication with Friends and Family: More than three times a week    Frequency of Social Gatherings with Friends and Family: Three times a week    Attends Religious Services: 1 to 4 times per year    Active Member of Clubs or Organizations: No    Attends Banker Meetings: Never    Marital Status: Married    Tobacco Counseling Counseling given: Not Answered Tobacco comments: Smoked [phone] up to 2 ppd    Clinical Intake:  Pre-visit preparation completed: Yes  Pain : 0-10 Pain Location: Buttocks Pain Orientation: Left Pain Radiating Towards: leg Pain Onset: More than a month ago     BMI - recorded: 31.02 Nutritional Status: BMI > 30  Obese Nutritional Risks: None Diabetes: No  Lab Results  Component Value Date   HGBA1C 5.6 02/07/2023   HGBA1C 5.7 02/05/2022   HGBA1C 5.6 09/16/2019     How often do you need to have someone help you when you read instructions, pamphlets, or other  written materials from your doctor or pharmacy?: 1 - Never  Interpreter Needed?: No  Information entered by :: Lolita Libra, CMA(AAMA)   Activities of Daily Living     01/22/2024    9:51 AM  In your present state of health, do you have any difficulty performing the following activities:  Hearing? 0  Vision? 0  Difficulty concentrating or making decisions? 0  Walking or climbing stairs? 1  Dressing or bathing? 0  Doing errands, shopping? 0  Preparing Food and eating ? N  Using the Toilet? N  In the past six months, have you accidently leaked urine? Y  Comment has some leakage when getting up from bed or cough to use the restroom. Gets up once at night to urinate  Do you have problems with loss of bowel control? N  Managing your Medications? N  Managing your Finances? N  Housekeeping or managing your Housekeeping? N    Patient Care Team: Copland, Harlene BROCKS, MD as PCP - General (Family Medicine) Verlin Lonni BIRCH, MD as PCP - Cardiology (Cardiology) Trixie File, MD as Consulting Physician (Internal Medicine) Rosalynn LELON Ingle, MD (Inactive) as Consulting Physician (Obstetrics and Gynecology) Robinson Mayo, OD as Referring Physician (Optometry) Charmayne Molly, MD as Consulting Physician (Ophthalmology)  I have updated your Care Teams any recent Medical Services you may have received from other providers in the past year.     Assessment:   This is a routine wellness examination for Gilda.  Hearing/Vision screen Hearing Screening - Comments:: Denies hearing difficulties.  Vision Screening - Comments:: Up to date with routine eye exams with Molly Charmayne    Goals Addressed   None    Depression Screen     01/22/2024   10:00 AM 02/07/2023  9:49 AM 01/18/2023    9:45 AM 02/05/2022    1:22 PM 01/16/2022    8:25 AM 01/14/2021   10:01 AM 11/02/2019   11:16 AM  PHQ 2/9 Scores  PHQ - 2 Score 6 0 6 1 1 2 2   PHQ- 9 Score 13 0 16 4  7 9     Fall Risk      01/22/2024    9:49 AM 02/07/2023    9:49 AM 01/18/2023    9:41 AM 02/05/2022    1:22 PM 01/16/2022    8:25 AM  Fall Risk   Falls in the past year? 1 0 0 0 0  Comment parked by curb and tripped over the curb      Number falls in past yr: 0 0 0 0 0  Injury with Fall? 0 0 0 0 0  Risk for fall due to : Impaired balance/gait No Fall Risks No Fall Risks No Fall Risks No Fall Risks  Follow up Education provided Falls evaluation completed Falls evaluation completed Falls evaluation completed  Falls evaluation completed      Data saved with a previous flowsheet row definition    MEDICARE RISK AT HOME:  Medicare Risk at Home Any stairs in or around the home?: Yes If so, are there any without handrails?: No Home free of loose throw rugs in walkways, pet beds, electrical cords, etc?: Yes Adequate lighting in your home to reduce risk of falls?: Yes Life alert?: No Use of a cane, walker or w/c?: No Grab bars in the bathroom?: No Shower chair or bench in shower?: Yes Elevated toilet seat or a handicapped toilet?: No  TIMED UP AND GO:  Was the test performed?  Yes  Length of time to ambulate 10 feet: 8 sec Gait slow and steady without use of assistive device  Cognitive Function: 6CIT completed    11/22/2017   10:17 AM  MMSE - Mini Mental State Exam  Orientation to time 5  Orientation to Place 5  Registration 3  Attention/ Calculation 5  Recall 3  Language- name 2 objects 2  Language- repeat 1  Language- follow 3 step command 3  Language- read & follow direction 1  Write a sentence 1  Copy design 1  Total score 30        01/22/2024   10:10 AM 01/18/2023    9:50 AM 01/16/2022    8:30 AM  6CIT Screen  What Year? 0 points 0 points 0 points  What month? 0 points 0 points 0 points  What time? 0 points 0 points 0 points  Count back from 20 0 points 0 points 0 points  Months in reverse 0 points 0 points 0 points  Repeat phrase 0 points 0 points 2 points  Total Score 0 points 0 points  2 points    Immunizations Immunization History  Administered Date(s) Administered   Fluad Quad(high Dose 65+) 01/25/2021, 01/16/2022   Fluad Trivalent(High Dose 65+) 01/18/2023   INFLUENZA, HIGH DOSE SEASONAL PF 03/17/2013, 02/22/2017, 01/15/2018, 01/22/2024   Influenza Split 03/11/2012, 02/22/2017   Influenza Whole 02/20/2008, 03/06/2010   Influenza,inj,Quad PF,6+ Mos 03/04/2014   Influenza-Unspecified 03/08/2015, 03/06/2016, 01/27/2018   Moderna Sars-Covid-2 Vaccination 06/08/2019, 06/28/2019   PFIZER(Purple Top)SARS-COV-2 Vaccination 04/21/2020   Pneumococcal Conjugate-13 07/18/2015   Pneumococcal Polysaccharide-23 03/06/2013   Td 07/18/2015    Screening Tests Health Maintenance  Topic Date Due   COVID-19 Vaccine (4 - 2025-26 season) 01/21/2025 (Originally 01/06/2024)   Zoster Vaccines- Shingrix (1  of 2) 01/21/2025 (Originally 02/02/1965)   Medicare Annual Wellness (AWV)  01/21/2025   DTaP/Tdap/Td (2 - Tdap) 07/17/2025   Pneumococcal Vaccine: 50+ Years  Completed   Influenza Vaccine  Completed   DEXA SCAN  Completed   Hepatitis C Screening  Completed   HPV VACCINES  Aged Out   Meningococcal B Vaccine  Aged Out   Mammogram  Discontinued   Colonoscopy  Discontinued    Health Maintenance Items Addressed: Mammogram ordered, flu vaccine given. Declines COVID and shingles vaccines.  Additional Screening:  Vision Screening: Recommended annual ophthalmology exams for early detection of glaucoma and other disorders of the eye. Is the patient up to date with their annual eye exam?  Yes  Who is the provider or what is the name of the office in which the patient attends annual eye exams? [facility]  Dental Screening: Recommended annual dental exams for proper oral hygiene  Community Resource Referral / Chronic Care Management: CRR required this visit?  No   CCM required this visit?  No   Plan:    I have personally reviewed and noted the following in the patient's chart:    Medical and social history Use of alcohol, tobacco or illicit drugs  Current medications and supplements including opioid prescriptions. Patient is not currently taking opioid prescriptions. Functional ability and status Nutritional status Physical activity Advanced directives List of other physicians Hospitalizations, surgeries, and ER visits in previous 12 months Vitals Screenings to include cognitive, depression, and falls Referrals and appointments  In addition, I have reviewed and discussed with patient certain preventive protocols, quality metrics, and best practice recommendations. A written personalized care plan for preventive services as well as general preventive health recommendations were provided to patient.   Lolita Libra, CMA   01/22/2024   After Visit Summary: (In Person-Printed) AVS printed and given to the patient  Notes: Concerns reported to PCP in person.

## 2024-01-22 NOTE — Patient Instructions (Addendum)
 Gail Fuentes , Thank you for taking time out of your busy schedule to complete your Annual Wellness Visit with me. I enjoyed our conversation and look forward to speaking with you again next year. I, as well as your care team,  appreciate your ongoing commitment to your health goals. Please review the following plan we discussed and let me know if I can assist you in the future. Your Game plan/ To Do List    Referrals: If you haven't heard from the office you've been referred to, please reach out to them at the phone provided.   Follow up Visits: Next Medicare AWV with our clinical staff:  01/27/25 9:40am  Next Office Visit with your provider: today, 10:20 Dr Watt  Clinician Recommendations:  Aim for 30 minutes of exercise or brisk walking, 6-8 glasses of water, and 5 servings of fruits and vegetables each day.       This is a list of the screening recommended for you and due dates:  Health Maintenance  Topic Date Due   Zoster (Shingles) Vaccine (1 of 2) Never done   Flu Shot  12/06/2023   COVID-19 Vaccine (4 - 2025-26 season) 01/06/2024   Medicare Annual Wellness Visit  01/21/2025   DTaP/Tdap/Td vaccine (2 - Tdap) 07/17/2025   Pneumococcal Vaccine for age over 51  Completed   DEXA scan (bone density measurement)  Completed   Hepatitis C Screening  Completed   HPV Vaccine  Aged Out   Meningitis B Vaccine  Aged Out   Breast Cancer Screening  Discontinued   Colon Cancer Screening  Discontinued    Advanced directives: (Copy Requested) Please bring a copy of your health care power of attorney and living will to the office to be added to your chart at your convenience. You can mail to [facility] [address] [place], [place] [address] or email to [email or web address] Advance Care Planning is important because it:  [x]  Makes sure you receive the medical care that is consistent with your values, goals, and preferences  [x]  It provides guidance to your family  and loved ones and reduces their decisional burden about whether or not they are making the right decisions based on your wishes.  Follow the link provided in your after visit summary or read over the paperwork we have mailed to you to help you started getting your Advance Directives in place. If you need assistance in completing these, please reach out to us  so that we can help you!  See attachments for Preventive Care and Fall Prevention Tips.

## 2024-01-23 ENCOUNTER — Ambulatory Visit: Admitting: Clinical

## 2024-02-04 ENCOUNTER — Other Ambulatory Visit (INDEPENDENT_AMBULATORY_CARE_PROVIDER_SITE_OTHER)

## 2024-02-04 ENCOUNTER — Encounter: Payer: Self-pay | Admitting: Family Medicine

## 2024-02-04 DIAGNOSIS — M25552 Pain in left hip: Secondary | ICD-10-CM | POA: Diagnosis not present

## 2024-02-04 DIAGNOSIS — E875 Hyperkalemia: Secondary | ICD-10-CM

## 2024-02-04 LAB — C-REACTIVE PROTEIN: CRP: 0.7 mg/dL (ref 0.5–20.0)

## 2024-02-04 LAB — SEDIMENTATION RATE: Sed Rate: 9 mm/h (ref 0–30)

## 2024-02-04 LAB — POTASSIUM: Potassium: 4.3 meq/L (ref 3.5–5.1)

## 2024-02-05 ENCOUNTER — Ambulatory Visit: Admitting: Clinical

## 2024-02-05 DIAGNOSIS — F331 Major depressive disorder, recurrent, moderate: Secondary | ICD-10-CM

## 2024-02-05 DIAGNOSIS — F411 Generalized anxiety disorder: Secondary | ICD-10-CM | POA: Diagnosis not present

## 2024-02-05 NOTE — Progress Notes (Signed)
 Diagnosis: F33.1, Major Depression, Recurrent, Moderate,  F41.1 Time of session: 2:00 pm-3:00 pm CPT Code: 09162E (in person)  Ayari was seen in person for individual therapy. She reported ongoing stress related to her husband's worsening health. She shared that her PCP had encouraged her to consider a retirement community, and therapist engaged her in discussion of this, encouraging her to consider visiting some to see what she thinks. She is scheduled to be seen again in three weeks.    Treatment Plan Client Abilities/Strengths  Kanchan presented as motivated to engage in therapy and able to describe her challenges.  Client Treatment Preferences  Mickie prefers in-person appointments  Client Statement of Needs  Mckynlie is seeking cognitive behavioral therapy to manage symptoms of depression and anxiety.  Treatment Level  Biweekly  Symptoms  Anxiety: Excessive worry, sleep irregularity, difficulty relaxing (Status: maintained). Depression:  Depressed mood, lack of motivation, withdrawal from previously enjoyed activities, hopelessness  (Status: maintained).  Problems Addressed  New Description, New Description  Goals 1. Nastassja has a history of recurrent depression Objective Suan will increase self esteem and self compassion Target Date: 2025-01-16 Frequency: Daily Progress: 50 Modality: individual Objective Adrena will reduce anxious planning behavior and accept her personal limitations Progress: 50% Target Date: 2025-01-16 Signed by: Andriette Ponto, PhD Version 2 Signed on: 2021-03-16 11:22:00 -0500 Generated on: 2022-12-07T15:59:08Z Client: [name] [name]) [place] DOB: July 10, 1945  Objective Moet will improve her overall mood and sense of well-being Target Date: 2025-01-16 Frequency: Biweekly Progress: 50 Modality: individual Related Interventions 1. Therapist will provide Shacoria with opportunities to process her experiences in session 2. Therapist will help  Alanny to identify and disengage from maladaptive thought and behavior  patterns using CBT based strategies 3. Therapist will engage Skylan in behavior activation, including incorporation of structure,  pleasant events, and mastery events into her routine 4. Therapist will provide referrals for additional resources as appropriate  2. Livier has difficulty setting boundaries in relationships and  prioritizing self-care Objective Nadira will develop strategies to support healthy communication and boundary setting in relationships Target Date: 2025-01-16 Frequency: Biweekly Progress: 50 Modality: individual Related Interventions 1. Therapist will provide Jonathan with communication strategies and opportunities to practice,  including writing down her thoughts, in-session processing of interactions, and in-vivo role  plays Diagnosis Axis  none 300.02 (Generalized anxiety disorder) - Open - [Signifier: n/a]  Axis  none 296.32 (Major depressive affective disorder, recurrent episode, moderate) - Open -  [Signifier: n/a]  Signed by: Andriette Ponto, PhD Version 2 Signed on: 2021-03-16 11:22:00 -0500 Generated on: 2022-12-07T15:59:08Z Client: [name] [name] [name] [name] DOB: Dec 01, 1945 Provider: Andriette Ponto, PhD Created on: 2021-03-16 11:10:24 -0500 [facility] [address] [place], [place] [address] [phone] Conditions For Discharge Achievement of treatment goals and objective  Presenting Problem [name] reports a history of recurrent depression coupled with bouts of anxiety that has kept her in and out of therapy for the past 30 years. She also described herself has having a savior complex that can make it difficult to set healthy boundaries and take care of herself.  Symptoms depressed mood, excessive worry, lack of motivation, feelings of hopelessness about the future. Suicidal ideation and intent were denied in risk assessment History of Problem   [name] shared that she has experienced depression on and off for much of her life. Most recently, depression and anxiety has centered on her son and grandson.  Recent Trigger  Virlee shared that her self care especially suffered during COVID, and symptoms of  depression have increased as she has reflected upon her age and the next segment of her life. This has also triggered thoughts and fears related to the end of her parents' lives.  Marital and Family Information  Present family concerns/problems: [name] frequently worries for her son and grandson.  Strengths/resources in the family/friends: [name] described a supportive relationship with her husband.  Marital/sexual history patterns: [name] has been married for over 50 years and reported she rarely fights or argues with her husband. However, she also shared that both are conflict avoidant. She has been increasingly concerned about her husband's cognitive functioning in 2023 and 2024. Family of Origin  Problems in family of origin: [name] is the oldest of 4. Her father left the family for extended periods of time, leaving [name] with significant responsibilities in terms of helping her mother manage the household. When she was 53, she became pregnant, and her father sent her away from the home. She gave this baby up for adoption and has never told her son about this, although her husband knows.  Family background / ethnic factors: [name] reported that she lived in a household with significant financial struggles growing up.  No needs/concerns related to ethnicity reported when asked: No  Education/Vocation  Interpersonal concerns/problems: [name] has withdrawn socially since COVID, but reported that she continues to enjoy participating in activities with her church.  Personal strengths: [name] presented as motivated to engage in therapy, and has found it helpful in the past.  Military/work problems/concerns: None noted.  Leisure Activities/Daily  Functioning  decreased interest  Legal Status  No Legal Problems  Medical/Nutritional Concerns  no problems  Comments: [name] is prescribed prozac  and synthroid .  Substance use/abuse/dependence  unspecified  Comments: None reported  Religion/Spirituality  believes in god  Other  General Behavior: cooperative  Attire: appropriate  Gait: normal  Motor Activity: normal  Stream of Thought - Productivity: spontaneous  Stream of thought - Progression: normal  Stream of thought - Language: normal  Emotional tone and reactions - Mood: normal  Emotional tone and reactions - Affect: appropriate  Mental trend/Content of thoughts - Perception: normal  Mental trend/Content of thoughts - Orientation: normal  Mental trend/Content of thoughts - Memory: normal  Mental trend/Content of thoughts - General knowledge: consistent with education  Insight: good  Judgment: good       Andriette LITTIE Ponto, PhD               Andriette LITTIE Ponto, PhD

## 2024-02-06 ENCOUNTER — Ambulatory Visit (HOSPITAL_BASED_OUTPATIENT_CLINIC_OR_DEPARTMENT_OTHER)
Admission: RE | Admit: 2024-02-06 | Discharge: 2024-02-06 | Disposition: A | Discharge status: Home / Self Care | Source: Ambulatory Visit | Attending: Family Medicine | Admitting: Family Medicine

## 2024-02-06 ENCOUNTER — Encounter (HOSPITAL_BASED_OUTPATIENT_CLINIC_OR_DEPARTMENT_OTHER): Payer: Self-pay

## 2024-02-06 DIAGNOSIS — Z1231 Encounter for screening mammogram for malignant neoplasm of breast: Secondary | ICD-10-CM | POA: Diagnosis present

## 2024-02-13 ENCOUNTER — Other Ambulatory Visit: Payer: Self-pay | Admitting: Family Medicine

## 2024-02-13 DIAGNOSIS — I10 Essential (primary) hypertension: Secondary | ICD-10-CM

## 2024-02-13 DIAGNOSIS — F411 Generalized anxiety disorder: Secondary | ICD-10-CM

## 2024-02-16 ENCOUNTER — Other Ambulatory Visit: Payer: Self-pay | Admitting: Family Medicine

## 2024-02-16 DIAGNOSIS — Z636 Dependent relative needing care at home: Secondary | ICD-10-CM

## 2024-02-17 ENCOUNTER — Other Ambulatory Visit: Payer: Self-pay | Admitting: Family Medicine

## 2024-02-17 DIAGNOSIS — Z636 Dependent relative needing care at home: Secondary | ICD-10-CM

## 2024-02-17 NOTE — Telephone Encounter (Signed)
 Pharmacy comment: Alternative Requested:[facility] WANTS ONCE DAILY DOSING

## 2024-03-03 ENCOUNTER — Ambulatory Visit: Admitting: Clinical

## 2024-03-06 ENCOUNTER — Ambulatory Visit: Admitting: Clinical

## 2024-03-17 ENCOUNTER — Ambulatory Visit (INDEPENDENT_AMBULATORY_CARE_PROVIDER_SITE_OTHER): Admitting: Clinical

## 2024-03-17 DIAGNOSIS — F331 Major depressive disorder, recurrent, moderate: Secondary | ICD-10-CM | POA: Diagnosis not present

## 2024-03-17 DIAGNOSIS — F411 Generalized anxiety disorder: Secondary | ICD-10-CM

## 2024-03-17 NOTE — Progress Notes (Signed)
 Diagnosis: F33.1, Major Depression, Recurrent, Moderate,  F41.1 Time of session: 2:00 pm-3:00 pm CPT Code: 09208E (in person)  Gail Fuentes was seen in person for individual therapy. Session began by reviewing and updating her treatment plan. She provided input into and verbally consented to all goals and interventions. Gail Fuentes reflected upon ongoing stressors related to caring for her husband and brother. Therapist encouraged her to explore resources and options, and consider a longer term plan. She is scheduled to be seen again in January and will reach out if she needs to be seen sooner.   Treatment Plan Client Abilities/Strengths  Gail Fuentes presented as motivated to engage in therapy and able to describe her challenges.  Client Treatment Preferences  Gail Fuentes prefers in-person appointments  Client Statement of Needs  Gail Fuentes is seeking cognitive behavioral therapy to manage symptoms of depression and anxiety.  Treatment Level  Biweekly  Symptoms  Anxiety: Excessive worry, sleep irregularity, difficulty relaxing (Status: maintained). Depression:  Depressed mood, lack of motivation, withdrawal from previously enjoyed activities, hopelessness  (Status: maintained).  Problems Addressed  New Description, New Description  Goals 1. Audre has a history of recurrent depression Objective Gail Fuentes will increase self esteem and self compassion Target Date: 03/17/2025 Frequency: Daily Progress: 50 Modality: individual Objective Gail Fuentes will reduce anxious planning behavior and accept her personal limitations Progress: 60% Target Date: 11/11/2-26 Signed by: Andriette Ponto, PhD Version 2 Signed on: 2021-03-16 11:22:00 -0500 Generated on: 2022-12-07T15:59:08Z [name]) [place] DOB: [birth year]  Objective Samanthamarie will improve her overall mood and sense of well-being Target Date: 03/17/2025 Frequency: Biweekly Progress: 60 Modality: individual Related Interventions 1. Therapist  will provide Gail Fuentes with opportunities to process her experiences in session 2. Therapist will help Gail Fuentes to identify and disengage from maladaptive thought and behavior patterns using CBT based strategies 3. Therapist will engage Gail Fuentes in behavior activation, including incorporation of structure,  pleasant events, and mastery events into her routine 4. Therapist will provide referrals for additional resources as appropriate   Gail Fuentes has difficulty setting boundaries in relationships and  prioritizing self-care Objective Gail Fuentes will develop strategies to support healthy communication and boundary setting in relationships Target Date: 03/17/2025 Frequency: Biweekly Progress: 60 Modality: individual Related Interventions 1. Therapist will provide Tai with communication strategies and opportunities to practice,  including writing down her thoughts, in-session processing of interactions, and in-vivo role  plays Diagnosis Axis  none 300.02 (Generalized anxiety disorder) - Open - [Signifier: n/a]  Axis  none 296.32 (Major depressive affective disorder, recurrent episode, moderate) - Open -  [Signifier: n/a]  Signed by: Andriette Ponto, PhD Version 2 Signed on: 2021-03-16 11:22:00 -0500 Generated on: 2022-12-07T15:59:08Z Client: Gail MICAEL Gail Fuentes DOB: [birth year] Provider: Andriette Ponto, PhD Created on: 2021-03-16 11:10:24 -0500 [facility] [address] [place], [place] [address] [phone] Conditions For Discharge Achievement of treatment goals and objective  Presenting Problem Gail Fuentes reports a history of recurrent depression coupled with bouts of anxiety that has kept her in and out of therapy for the past 30 years. She also described herself has having a savior complex that can make it difficult to set healthy boundaries and take care of herself.  Symptoms depressed mood, excessive worry, lack of motivation, feelings of hopelessness  about the future. Suicidal ideation and intent were denied in risk assessment History of Problem  Gail Fuentes shared that she has experienced depression on and off for much of her life. Most recently, depression and anxiety has centered on her son and grandson.  Recent Trigger  Gail Fuentes shared that her self care especially suffered during COVID, and symptoms of depression have increased as she has reflected upon her age and the next segment of her life. This has also triggered thoughts and fears related to the end of her parents' lives.  Marital and Family Information  Present family concerns/problems: Gail Fuentes frequently worries for her son and grandson.  Strengths/resources in the family/friends: Gail Fuentes described a supportive relationship with her husband.  Marital/sexual history patterns: Gail Fuentes has been married for over 50 years and reported she rarely fights or argues with her husband. However, she also shared that both are conflict avoidant. She has increasingly taken on the role of caregiver for her husband in the past two years, as she suspects he may be suffering from dementia. Family of Origin  Problems in family of origin: Gail Fuentes is the oldest of 4. Her father left the family for extended periods of time, leaving Gail Fuentes with significant responsibilities in terms of helping her mother manage the household. When she was 89, she became pregnant, and her father sent her away from the home. She gave this baby up for adoption and has never told her son about this, although her husband knows.  Family background / ethnic factors: Gail Fuentes reported that she lived in a household with significant financial struggles growing up.  No needs/concerns related to ethnicity reported when asked: No  Education/Vocation  Interpersonal concerns/problems: Gail Fuentes has withdrawn socially since COVID, but reported that she continues to enjoy participating in activities with her church.  Personal strengths: Gail Fuentes presented as  motivated to engage in therapy, and has found it helpful in the past.  Military/work problems/concerns: None noted.  Leisure Activities/Daily Functioning  decreased interest  Legal Status  No Legal Problems  Medical/Nutritional Concerns  no problems  Comments: Gail Fuentes is prescribed prozac  and synthroid .  Substance use/abuse/dependence  unspecified  Comments: None reported  Religion/Spirituality  believes in god  Other  General Behavior: cooperative  Attire: appropriate  Gait: normal  Motor Activity: normal  Stream of Thought - Productivity: spontaneous  Stream of thought - Progression: normal  Stream of thought - Language: normal  Emotional tone and reactions - Mood: normal  Emotional tone and reactions - Affect: appropriate  Mental trend/Content of thoughts - Perception: normal  Mental trend/Content of thoughts - Orientation: normal  Mental trend/Content of thoughts - Memory: normal  Mental trend/Content of thoughts - General knowledge: consistent with education  Insight: good  Judgment: good       Andriette LITTIE Ponto, PhD               Andriette LITTIE Ponto, PhD               Andriette LITTIE Ponto, PhD

## 2024-03-19 ENCOUNTER — Telehealth: Payer: Self-pay

## 2024-03-19 NOTE — Telephone Encounter (Signed)
 Copied from CRM #[id number]. Topic: Clinical - Prescription Issue >> Mar 19, 2024 12:14 PM Alfonso ORN wrote: Reason for CRM: Pt calling because insurance won't cover rx. Pharm advised venlafaxine  XR (EFFEXOR -XR) 75 MG needs to be changed to 150 mg once a day for insurance to authorize .  Callback: [phone]

## 2024-03-24 ENCOUNTER — Other Ambulatory Visit: Payer: Self-pay | Admitting: Family Medicine

## 2024-03-24 MED ORDER — VENLAFAXINE HCL ER 150 MG PO CP24: 150.0000 mg | ORAL_CAPSULE | Freq: Every day | ORAL | 3 refills | Status: AC

## 2024-03-24 MED ORDER — VENLAFAXINE HCL ER 150 MG PO CP24: 150.0000 mg | ORAL_CAPSULE | Freq: Every day | ORAL | 3 refills | Status: DC

## 2024-03-24 NOTE — Addendum Note (Signed)
 Addended by: WATT RAISIN C on: 03/24/2024 04:17 PM   Modules accepted: Orders

## 2024-04-20 ENCOUNTER — Other Ambulatory Visit: Payer: Self-pay | Admitting: Family Medicine

## 2024-04-20 ENCOUNTER — Encounter: Payer: Self-pay | Admitting: Family Medicine

## 2024-04-20 ENCOUNTER — Ambulatory Visit: Payer: Self-pay

## 2024-04-20 DIAGNOSIS — J0110 Acute frontal sinusitis, unspecified: Secondary | ICD-10-CM

## 2024-04-20 MED ORDER — DOXYCYCLINE HYCLATE 100 MG PO CAPS: 100.0000 mg | ORAL_CAPSULE | Freq: Two times a day (BID) | ORAL | 0 refills | Status: AC

## 2024-04-20 NOTE — Telephone Encounter (Signed)
 FYI Only or Action Required?: Action required by provider: clinical question for provider. Pt would like abx call in for a sinus infections, declined appt.   Patient was last seen in primary care on 01/22/2024 by Copland, Harlene BROCKS, MD.  Called Nurse Triage reporting Facial Pain.  Symptoms began several weeks ago.  Interventions attempted: OTC medications: tylenol , does not help pain.  Symptoms are: gradually worsening.  Triage Disposition: See PCP When Office is Open (Within 3 Days)  Patient/caregiver understands and will follow disposition?: No, wishes to speak with PCP  Reason for Triage: Patient is experience symptoms of a Sinus infection: Looking to have a Rx sent to her pharmacy (Antibiotic/Drops/Etc.) Symptoms include: - Bad headache - Sinus Pressure (under eyes, around cheeks,sinus cavity) - Teeth Hurting/Sore  **Sending to NT to further assist pt w. Symptoms**   Reason for Disposition  [1] Sinus congestion (pressure, fullness) AND [2] present > 10 days  Answer Assessment - Initial Assessment Questions 1. LOCATION: Where does it hurt?      Pressure by cheek bones, teeth hurt,  2. ONSET: When did the sinus pain start?  (e.g., hours, days)      About 2 weeks, getting worse 3. SEVERITY: How bad is the pain?   (Scale 0-10; or none, mild, moderate or severe)     4 4. RECURRENT SYMPTOM: Have you ever had sinus problems before? If Yes, ask: When was the last time? and What happened that time?      Yes gets sinus infection yearly 5. NASAL CONGESTION: Is the nose blocked? If Yes, ask: Can you open it or must you breathe through your mouth?     Nose was running 6. NASAL DISCHARGE: Do you have discharge from your nose? If so ask, What color?     clear 7. FEVER: Do you have a fever? If Yes, ask: What is it, how was it measured, and when did it start?      denies 8. OTHER SYMPTOMS: Do you have any other symptoms? (e.g., sore throat, cough, earache,  difficulty breathing)     denies  Pt states she would like abx called in for her.  Protocols used: Sinus Pain or Congestion-A-AH

## 2024-04-20 NOTE — Telephone Encounter (Signed)
 Pt refused to schedule appt, requesting abx be called in.

## 2024-05-11 ENCOUNTER — Ambulatory Visit (INDEPENDENT_AMBULATORY_CARE_PROVIDER_SITE_OTHER): Admitting: Clinical

## 2024-05-11 DIAGNOSIS — F331 Major depressive disorder, recurrent, moderate: Secondary | ICD-10-CM | POA: Diagnosis not present

## 2024-05-11 DIAGNOSIS — F411 Generalized anxiety disorder: Secondary | ICD-10-CM

## 2024-05-11 NOTE — Progress Notes (Signed)
 Diagnosis: F33.1, Major Depression, Recurrent, Moderate,  F41.1 Time of session: 2:00 pm-3:00 pm CPT Code: 09208E (in person)  Oluwatobi was seen in person for individual therapy. She reported upon an especially stressful holiday season during which her husband had officially received an alzheimer's diagnosis. Session included processing her emotions about this development. Eugena reported feelings of hopelessness, coupled with an ongoing sense of responsibility for family members. Therapist suggested thinking more concretely about actions she can take in her next session, and she was open to this idea. She is scheduled to be seen again in two weeks.   Treatment Plan Client Abilities/Strengths  Charlsie presented as motivated to engage in therapy and able to describe her challenges.  Client Treatment Preferences  Madalin prefers in-person appointments  Client Statement of Needs  Jezebel is seeking cognitive behavioral therapy to manage symptoms of depression and anxiety.  Treatment Level  Biweekly  Symptoms  Anxiety: Excessive worry, sleep irregularity, difficulty relaxing (Status: maintained). Depression:  Depressed mood, lack of motivation, withdrawal from previously enjoyed activities, hopelessness  (Status: maintained).  Problems Addressed  New Description, New Description  Goals 1. Enslie has a history of recurrent depression Objective Stefania will increase self esteem and self compassion Target Date: 03/17/2025 Frequency: Daily Progress: 50 Modality: individual Objective Akhila will reduce anxious planning behavior and accept her personal limitations Progress: 60% Target Date: 11/11/2-26 Signed by: Andriette Ponto, PhD Version 2 Signed on: 2021-03-16 11:22:00 -0500 Generated on: 2022-12-07T15:59:08Z Client: [name] MICAEL LIGHTS) [place] DOB: 03/31/1946  Objective Julene will improve her overall mood and sense of well-being Target Date: 03/17/2025 Frequency:  Biweekly Progress: 60 Modality: individual Related Interventions 1. Therapist will provide Channon with opportunities to process her experiences in session 2. Therapist will help Airam to identify and disengage from maladaptive thought and behavior patterns using CBT based strategies 3. Therapist will engage Lota in behavior activation, including incorporation of structure,  pleasant events, and mastery events into her routine 4. Therapist will provide referrals for additional resources as appropriate   Kentley has difficulty setting boundaries in relationships and  prioritizing self-care Objective Tiasha will develop strategies to support healthy communication and boundary setting in relationships Target Date: 03/17/2025 Frequency: Biweekly Progress: 60 Modality: individual Related Interventions 1. Therapist will provide Hiroko with communication strategies and opportunities to practice,  including writing down her thoughts, in-session processing of interactions, and in-vivo role  plays Diagnosis Axis  none 300.02 (Generalized anxiety disorder) - Open - [Signifier: n/a]  Axis  none 296.32 (Major depressive affective disorder, recurrent episode, moderate) - Open -  [Signifier: n/a]  Signed by: Andriette Ponto, PhD Version 2 Signed on: 2021-03-16 11:22:00 -0500 Generated on: 2022-12-07T15:59:08Z Client: [name] MICAEL [name] [name] DOB: Dec 15, 1945 Provider: Andriette Ponto, PhD Created on: 2021-03-16 11:10:24 -0500 [facility] [address] [place], [place] [address] [phone] Conditions For Discharge Achievement of treatment goals and objective  Presenting Problem [name] reports a history of recurrent depression coupled with bouts of anxiety that has kept her in and out of therapy for the past 30 years. She also described herself has having a savior complex that can make it difficult to set healthy boundaries and take care of herself.   Symptoms depressed mood, excessive worry, lack of motivation, feelings of hopelessness about the future. Suicidal ideation and intent were denied in risk assessment History of Problem  [name] shared that she has experienced depression on and off for much of her life. Most recently, depression and anxiety has centered on her son and  grandson.  Recent Trigger  Dekayla shared that her self care especially suffered during COVID, and symptoms of depression have increased as she has reflected upon her age and the next segment of her life. This has also triggered thoughts and fears related to the end of her parents' lives.  Marital and Family Information  Present family concerns/problems: [name] frequently worries for her son and grandson.  Strengths/resources in the family/friends: [name] described a supportive relationship with her husband.  Marital/sexual history patterns: [name] has been married for over 50 years and reported she rarely fights or argues with her husband. However, she also shared that both are conflict avoidant. She has increasingly taken on the role of caregiver for her husband in the past two years, as she suspects he may be suffering from dementia. Family of Origin  Problems in family of origin: [name] is the oldest of 4. Her father left the family for extended periods of time, leaving [name] with significant responsibilities in terms of helping her mother manage the household. When she was 89, she became pregnant, and her father sent her away from the home. She gave this baby up for adoption and has never told her son about this, although her husband knows.  Family background / ethnic factors: [name] reported that she lived in a household with significant financial struggles growing up.  No needs/concerns related to ethnicity reported when asked: No  Education/Vocation  Interpersonal concerns/problems: [name] has withdrawn socially since COVID, but reported that she continues to  enjoy participating in activities with her church.  Personal strengths: [name] presented as motivated to engage in therapy, and has found it helpful in the past.  Military/work problems/concerns: None noted.  Leisure Activities/Daily Functioning  decreased interest  Legal Status  No Legal Problems  Medical/Nutritional Concerns  no problems  Comments: [name] is prescribed prozac  and synthroid .  Substance use/abuse/dependence  unspecified  Comments: None reported  Religion/Spirituality  believes in god  Other  General Behavior: cooperative  Attire: appropriate  Gait: normal  Motor Activity: normal  Stream of Thought - Productivity: spontaneous  Stream of thought - Progression: normal  Stream of thought - Language: normal  Emotional tone and reactions - Mood: normal  Emotional tone and reactions - Affect: appropriate  Mental trend/Content of thoughts - Perception: normal  Mental trend/Content of thoughts - Orientation: normal  Mental trend/Content of thoughts - Memory: normal  Mental trend/Content of thoughts - General knowledge: consistent with education  Insight: good  Judgment: good      Andriette LITTIE Ponto, PhD

## 2024-05-18 ENCOUNTER — Other Ambulatory Visit: Payer: Self-pay

## 2024-05-27 ENCOUNTER — Ambulatory Visit: Admitting: Clinical

## 2024-06-02 NOTE — Therapy (Incomplete)
 " OUTPATIENT PHYSICAL THERAPY THORACOLUMBAR EVALUATION   Patient Name: Gail Fuentes MRN: [id number] DOB:February 23, 1946, 79 y.o., female Today's Date: 06/02/2024  END OF SESSION:   Past Medical History:  Diagnosis Date   Abnormal EKG    per patient- states evaluated by Dr Edith in the past   Allergy    sneezing  late Fall   Anemia 01/23/12   H/H 11.7/35   Anxiety state, unspecified    Arthritis    Benign paroxysmal positional vertigo    Depression    Depressive disorder, not elsewhere classified    Diverticulosis    Esophageal reflux    Esophageal stricture    Gallstones    Heart murmur    Hiatal hernia    History of hyperparathyroidism    History of UTI    HTN (hypertension)    Hyperlipidemia    OSA (obstructive sleep apnea) 05/27/2018   Other abnormal glucose    Personal history of other diseases of digestive system    12-15 since 1990, no recurrence 2006   PONV (postoperative nausea and vomiting)    Unspecified hypothyroidism    Vitamin D  deficiency    Past Surgical History:  Procedure Laterality Date   CATARACT EXTRACTION, BILATERAL Bilateral 12/06/2019   [facility]   CESAREAN SECTION  05/08/1975   CHOLECYSTECTOMY  05/07/2002   COLONOSCOPY  12/06/2003   [facility]   DENTAL SURGERY  1962, 2012   Age 32-Abscessed Teeth Extracted    PARATHYROIDECTOMY Left 02/13/2013   Procedure: LEFT INFERIOR PARATHYROIDECTOMY with frozen section ;  Surgeon: Krystal CHRISTELLA Spinner, MD;  Location: [facility];  Service: General;  Laterality: Left;   TOTAL ABDOMINAL HYSTERECTOMY W/ BILATERAL SALPINGOOPHORECTOMY  05/08/2003   Ovarian Cyst   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    History of hyperparathyroidism 08/24/2014   Obesity (BMI 30-39.9) 10/08/2013   HTN (hypertension) 07/13/2013   Elevated blood pressure reading without diagnosis of hypertension 06/29/2013   Osteoporosis 11/14/2012   Acute iritis of both eyes 10/17/2012   Thyroid  nodule 10/17/2012   Cardiomegaly 02/20/2012   DISTURBANCES OF SENSATION OF SMELL AND TASTE 04/19/2010   Vitamin D  deficiency 12/24/2008   ANXIETY 12/24/2008   DEPRESSION 12/24/2008   BENIGN PAROXYSMAL POSITIONAL VERTIGO 12/24/2008   HYPERGLYCEMIA 12/24/2008   Nonspecific abnormal electrocardiogram (ECG) (EKG) 12/24/2008   Hyperlipidemia 07/23/2007   Hypothyroidism 07/22/2007   GERD 07/22/2007   DIVERTICULITIS, HX OF 07/22/2007    PCP: Watt Harlene BROCKS, MD   REFERRING PROVIDER: Teresa Rankin LELON, MD   REFERRING DIAG: M54.16 (ICD-10-CM) - Radiculopathy, lumbar region   THERAPY DIAG:  No diagnosis found.  RATIONALE FOR EVALUATION AND TREATMENT: Rehabilitation  ONSET DATE: ***  NEXT MD VISIT: ***   SUBJECTIVE:  SUBJECTIVE STATEMENT: ***  PAIN: Are you having pain? {OPRCPAIN:27236}  PERTINENT HISTORY:  ***anxiety, OA, BPPV, depression, HTN, hyperparathyroidism s/p parathyroidectomy, obesity, OSA, osteoporosis, closed nondisplaced fracture of styloid process of right radius - 2020  PRECAUTIONS: {Therapy precautions:24002}  RED FLAGS: {PT Red Flags:29287}  WEIGHT BEARING RESTRICTIONS: {Yes ***/No:24003}  FALLS:  Has patient fallen in last 6 months? {fallsyesno:27318}  LIVING ENVIRONMENT: Lives with: {OPRC lives with:25569::lives with their family} Lives in: {Lives in:25570} Stairs: {opstairs:27293} Has following equipment at home: {Assistive devices:23999}  OCCUPATION: ***Retired  PLOF:  {PLOF:24004}  PATIENT GOALS: ***   OBJECTIVE: (objective measures completed at initial evaluation unless otherwise dated)  DIAGNOSTIC FINDINGS:  ***02/07/2023 - LUMBAR SPINE - COMPLETE 4+ VIEW; DG HIP (WITH OR WITHOUT PELVIS) 2-3V LEFT   CLINICAL DATA:  Left lower back and left hip pain, no known injury   FINDINGS: Osteopenia. No fracture or dislocation of the lumbar spine. Mild disc space height loss and osteophytosis throughout. Mild to moderate facet degenerative change at the lower lumbar levels.   No fracture or dislocation of the left hip. Symmetric bilateral superior femoroacetabular joint space loss. Minimal femoroacetabular osteophytosis. Nonobstructive pattern of overlying bowel gas. Aortic atherosclerosis.   IMPRESSION: 1. No fracture or dislocation of the lumbar spine or left hip. 2. Mild disc space height loss and osteophytosis throughout. Mild to moderate facet degenerative change at the lower lumbar levels. Lumbar disc and neural foraminal pathology may be further evaluated by MRI if indicated by neurologically localizing signs and symptoms 3. Symmetric bilateral femoroacetabular arthrosis. 4. Osteopenia.  PATIENT SURVEYS:  Modified Oswestry:  MODIFIED OSWESTRY DISABILITY SCALE  Date:  Score - 06/02/2024 ***  Pain intensity {ODI 1:32962}  2. Personal care (washing, dressing, etc.) {ODI 2:32963}  3. Lifting {ODI 3:32964}  4. Walking {ODI 4:32965}  5. Sitting {ODI 5:32966}  6. Standing {ODI 6:32967}  7. Sleeping {ODI 7:32968}  8. Social Life {ODI 8:32969}  9. Traveling {ODI 9:32970}  10. Employment/ Homemaking {ODI 10:32971}  Total ***/50   Interpretation of scores: Score Category Description  0-20% Minimal Disability The patient can cope with most living activities. Usually no treatment is indicated apart from advice on lifting, sitting and exercise  21-40% Moderate Disability The patient experiences more pain and difficulty with sitting, lifting and  standing. Travel and social life are more difficult and they may be disabled from work. Personal care, sexual activity and sleeping are not grossly affected, and the patient can usually be managed by conservative means  41-60% Severe Disability Pain remains the main problem in this group, but activities of daily living are affected. These patients require a detailed investigation  61-80% Crippled Back pain impinges on all aspects of the patients life. Positive intervention is required  81-100% Bed-bound These patients are either bed-bound or exaggerating their symptoms  Bluford FORBES Zoe DELENA Karon DELENA, et al. Surgery versus conservative management of stable thoracolumbar fracture: the PRESTO feasibility RCT. [place] ([place]): [employer]; 2021 Nov. [facility] Technology Assessment, No. 25.62.) Appendix 3, Oswestry Disability Index category descriptors. Available from: Findjewelers.cz  Minimally Clinically Important Difference (MCID) = 12.8%  SCREENING FOR RED FLAGS: Bowel or bladder incontinence: {Yes/No:304960894} Spinal tumors: {Yes/No:304960894} Cauda equina syndrome: {Yes/No:304960894} Compression fracture: {Yes/No:304960894} Abdominal aneurysm: {Yes/No:304960894}  COGNITION:  Overall cognitive status: {cognition:24006}    SENSATION: {sensation:27233}  POSTURE:  {posture:25561}  PALPATION: ***  LUMBAR ROM:   {AROM/PROM:27142}  Eval  Flexion   Extension   Right lateral flexion   Left lateral flexion   Right  rotation   Left rotation   (Blank rows = not tested)  MUSCLE LENGTH: Hamstrings: Right *** deg; Left *** deg Debby test: Right *** deg; Left *** deg Hamstrings: *** ITB: *** Piriformis: *** Hip flexors: *** Quads: *** Heelcord: ***  LOWER EXTREMITY ROM:     {AROM/PROM:27142}  Right eval Left eval  Hip flexion    Hip extension    Hip abduction    Hip adduction    Hip internal rotation    Hip external rotation    Knee  flexion    Knee extension    Ankle dorsiflexion    Ankle plantarflexion    Ankle inversion    Ankle eversion    (Blank rows = not tested)  LOWER EXTREMITY MMT:    MMT Right eval Left eval  Hip flexion    Hip extension    Hip abduction    Hip adduction    Hip internal rotation    Hip external rotation    Knee flexion    Knee extension    Ankle dorsiflexion    Ankle plantarflexion    Ankle inversion    Ankle eversion     (Blank rows = not tested)  LUMBAR SPECIAL TESTS:  {lumbar special test:25242}  FUNCTIONAL TESTS:  {Functional tests:24029}  GAIT: Distance walked: *** Assistive device utilized: {Assistive devices:23999} Level of assistance: {Levels of assistance:24026} Gait pattern: {gait characteristics:25376} Comments: ***   TODAY'S TREATMENT:   *** 06/02/2024 - Eval SELF CARE:  Reviewed eval findings and role of PT in addressing identified deficits as well as instruction in initial HEP (see below).    PATIENT EDUCATION:  Education details: {Education details:27468}  Person educated: {Person educated:25204} Education method: {Education Method EU:67125} Education comprehension: {Education Comprehension:25206}  HOME EXERCISE PROGRAM: ***   ASSESSMENT:  CLINICAL IMPRESSION: MYKAYLA BRINTON is a 79 y.o. female who was referred to physical therapy for evaluation and treatment for ***B lumbar radiculopathy.  ***   Patient reports onset of *** pain beginning ***. Pain is worse with ***.  Patient has deficits in *** ROM, *** LE flexibility, *** strength, abnormal posture, and TTP with abnormal muscle tension *** which are interfering with ADLs and are impacting quality of life.  On Modified Oswestry patient scored ***/50 demonstrating ***% or *** disability.  Emelina will benefit from skilled PT to address above deficits to improve mobility and activity tolerance with decreased pain interference.   ***  OBJECTIVE IMPAIRMENTS: {opptimpairments:25111}.    ACTIVITY LIMITATIONS: {activitylimitations:27494}  PARTICIPATION LIMITATIONS: {participationrestrictions:25113}  PERSONAL FACTORS: {Personal factors:25162} are also affecting patient's functional outcome.   REHAB POTENTIAL: {rehabpotential:25112}  CLINICAL DECISION MAKING: {clinical decision making:25114}  EVALUATION COMPLEXITY: {Evaluation complexity:25115}   GOALS: Goals reviewed with patient? {yes/no:20286}  SHORT TERM GOALS: Target date: ***  Patient will be independent with initial HEP to improve outcomes and carryover.  Baseline: *** Goal status: {GOALSTATUS:25110}  2.  Patient will report 25% improvement in low back pain to improve QOL. Baseline: *** Goal status: {GOALSTATUS:25110}  3.  *** Baseline:  Goal status: {GOALSTATUS:25110}   LONG TERM GOALS: Target date: ***  Patient will be independent with ongoing/advanced HEP for self-management at home.  Baseline: *** Goal status: {GOALSTATUS:25110}  2.  Patient will report 50-75% improvement in low back pain to improve QOL.  Baseline: *** Goal status: {GOALSTATUS:25110}  3.  Patient to demonstrate ability to achieve and maintain good spinal alignment/posturing and body mechanics needed for daily activities. Baseline: *** Goal status: {GOALSTATUS:25110}  4.  Patient  will demonstrate full pain free lumbar ROM to perform ADLs.   Baseline: Refer to above lumbar ROM table Goal status: {GOALSTATUS:25110}  5.  Patient will demonstrate improved *** strength to >/= ***/5 for improved stability and ease of mobility. Baseline: Refer to above LE MMT table Goal status: {GOALSTATUS:25110}  6. Patient will report </= ***% on Modified Oswestry (MCID = 12%) to demonstrate improved functional ability with decreased pain interference. Baseline: *** Goal status: {GOALSTATUS:25110}  7.  Patient will tolerate *** min of (standing/sitting/walking) w/o increased pain to allow for *** improved mobility and activity  tolerance. Baseline: *** Goal status: {GOALSTATUS:25110}  8.  Patient will report centralization of radicular symptoms.  Baseline: *** Goal status: {GOALSTATUS:25110}  9.  Patient will ***.  Baseline: *** Goal status: {GOALSTATUS:25110}   10.  *** Baseline: *** Goal status: {GOALSTATUS:25110}    PLAN:  PT FREQUENCY: {rehab frequency:25116}  PT DURATION: {rehab duration:25117}  PLANNED INTERVENTIONS: {rehab planned interventions:25118::97110-Therapeutic exercises,97530- Therapeutic [phone]- Neuromuscular re-education,97535- Self Rjmz,02859- Manual therapy,Patient/Family education}  PLAN FOR NEXT SESSION: ***   Elijah CHRISTELLA Hidden, PT 06/02/2024, 12:29 PM  "

## 2024-06-08 ENCOUNTER — Ambulatory Visit: Admitting: Physical Therapy

## 2024-06-12 ENCOUNTER — Ambulatory Visit: Admitting: Clinical

## 2024-06-12 DIAGNOSIS — F331 Major depressive disorder, recurrent, moderate: Secondary | ICD-10-CM

## 2024-06-12 DIAGNOSIS — F411 Generalized anxiety disorder: Secondary | ICD-10-CM

## 2024-06-12 NOTE — Progress Notes (Signed)
 Diagnosis: F33.1, Major Depression, Recurrent, Moderate,  F41.1 Time of session: 10:00 am-11:00 am CPT Code: 09162E (in person)  Gail Fuentes was seen in person for individual therapy. She reported upon escalating stressors at home and with her own health, and therapist worked with her to consider next steps. She created a plan to schedule for someone to clean her home and open up to her son about recent stressors. Therapist encouraged her to explore assisted living facilities, and provided several recommendations. She is scheduled to be seen again in one month and will reach out if she needs to be seen sooner.   Treatment Plan Client Abilities/Strengths  Gail Fuentes presented as motivated to engage in therapy and able to describe her challenges.  Client Treatment Preferences  Gail Fuentes prefers in-person appointments  Client Statement of Needs  Gail Fuentes is seeking cognitive behavioral therapy to manage symptoms of depression and anxiety.  Treatment Level  Biweekly  Symptoms  Anxiety: Excessive worry, sleep irregularity, difficulty relaxing (Status: maintained). Depression:  Depressed mood, lack of motivation, withdrawal from previously enjoyed activities, hopelessness  (Status: maintained).  Problems Addressed  New Description, New Description  Goals 1. Gail Fuentes has a history of recurrent depression Objective Gail Fuentes will increase self esteem and self compassion Target Date: 03/17/2025 Frequency: Daily Progress: 50 Modality: individual Objective Gail Fuentes will reduce anxious planning behavior and accept her personal limitations Progress: 60% Target Date: 11/11/2-26 Signed by: Andriette Ponto, PhD Version 2 Signed on: 2021-03-16 11:22:00 -0500 Generated on: 2022-12-07T15:59:08Z [name] DOB: October 26, 1945  Objective Gail Fuentes will improve her overall mood and sense of well-being Target Date: 03/17/2025 Frequency: Biweekly Progress: 60 Modality: individual Related  Interventions 1. Therapist will provide Gail Fuentes with opportunities to process her experiences in session 2. Therapist will help Gail Fuentes to identify and disengage from maladaptive thought and behavior patterns using CBT based strategies 3. Therapist will engage Gail Fuentes in behavior activation, including incorporation of structure,  pleasant events, and mastery events into her routine 4. Therapist will provide referrals for additional resources as appropriate   Gail Fuentes has difficulty setting boundaries in relationships and  prioritizing self-care Objective Gail Fuentes will develop strategies to support healthy communication and boundary setting in relationships Target Date: 03/17/2025 Frequency: Biweekly Progress: 60 Modality: individual Related Interventions 1. Therapist will provide Gail Fuentes with communication strategies and opportunities to practice,  including writing down her thoughts, in-session processing of interactions, and in-vivo role  plays Diagnosis Axis  none 300.02 (Generalized anxiety disorder) - Open - [Signifier: n/a]  Axis  none 296.32 (Major depressive affective disorder, recurrent episode, moderate) - Open -  [Signifier: n/a]  Signed by: Andriette Ponto, PhD Version 2 Signed on: 2021-03-16 11:22:00 -0500 Generated on: 2022-12-07T15:59:08Z [name] DOB: 01-22-1946 Provider: Andriette Ponto, PhD Created on: 2021-03-16 11:10:24 -0500 [facility] [address] [place], [place] [address] [phone] Conditions For Discharge Achievement of treatment goals and objective  Presenting Problem Gail Fuentes reports a history of recurrent depression coupled with bouts of anxiety that has kept her in and out of therapy for the past 30 years. She also described herself has having a savior complex that can make it difficult to set healthy boundaries and take care of herself.  Symptoms depressed mood, excessive worry, lack of motivation,  feelings of hopelessness about the future. Suicidal ideation and intent were denied in risk assessment History of Problem  Gail Fuentes shared that she has experienced depression on and off for much of her life. Most recently, depression and anxiety has centered on  her son and grandson.  Recent Trigger  Gail Fuentes shared that her self care especially suffered during COVID, and symptoms of depression have increased as she has reflected upon her age and the next segment of her life. This has also triggered thoughts and fears related to the end of her parents' lives.  Marital and Family Information  Present family concerns/problems: Gail Fuentes frequently worries for her son and grandson.  Strengths/resources in the family/friends: Gail Fuentes described a supportive relationship with her husband.  Marital/sexual history patterns: Gail Fuentes has been married for over 50 years and reported she rarely fights or argues with her husband. However, she also shared that both are conflict avoidant. She has increasingly taken on the role of caregiver for her husband in the past two years, as she suspects he may be suffering from dementia. Family of Origin  Problems in family of origin: Gail Fuentes is the oldest of 4. Her father left the family for extended periods of time, leaving Gail Fuentes with significant responsibilities in terms of helping her mother manage the household. When she was 89, she became pregnant, and her father sent her away from the home. She gave this baby up for adoption and has never told her son about this, although her husband knows.  Family background / ethnic factors: Gail Fuentes reported that she lived in a household with significant financial struggles growing up.  No needs/concerns related to ethnicity reported when asked: No  Education/Vocation  Interpersonal concerns/problems: Gail Fuentes has withdrawn socially since COVID, but reported that she continues to enjoy participating in activities with her church.  Personal  strengths: Gail Fuentes presented as motivated to engage in therapy, and has found it helpful in the past.  Military/work problems/concerns: None noted.  Leisure Activities/Daily Functioning  decreased interest  Legal Status  No Legal Problems  Medical/Nutritional Concerns  no problems  Comments: Gail Fuentes is prescribed prozac  and synthroid .  Substance use/abuse/dependence  unspecified  Comments: None reported  Religion/Spirituality  believes in god  Other  General Behavior: cooperative  Attire: appropriate  Gait: normal  Motor Activity: normal  Stream of Thought - Productivity: spontaneous  Stream of thought - Progression: normal  Stream of thought - Language: normal  Emotional tone and reactions - Mood: normal  Emotional tone and reactions - Affect: appropriate  Mental trend/Content of thoughts - Perception: normal  Mental trend/Content of thoughts - Orientation: normal  Mental trend/Content of thoughts - Memory: normal  Mental trend/Content of thoughts - General knowledge: consistent with education  Insight: good  Judgment: good      Andriette LITTIE Ponto, PhD               Andriette LITTIE Ponto, PhD

## 2024-06-16 ENCOUNTER — Ambulatory Visit

## 2024-12-04 ENCOUNTER — Ambulatory Visit: Admitting: Internal Medicine

## 2025-01-27 ENCOUNTER — Ambulatory Visit
